# Patient Record
Sex: Female | Born: 1955 | Race: White | Hispanic: No | Marital: Married | Smoking: Former smoker
Health system: Southern US, Community
[De-identification: ages and names within clinical notes are randomized; demographics above are authoritative.]

## PROBLEM LIST (undated history)

## (undated) DIAGNOSIS — I10 Essential (primary) hypertension: Secondary | ICD-10-CM

## (undated) DIAGNOSIS — I350 Nonrheumatic aortic (valve) stenosis: Secondary | ICD-10-CM

## (undated) DIAGNOSIS — K635 Polyp of colon: Secondary | ICD-10-CM

## (undated) DIAGNOSIS — I38 Endocarditis, valve unspecified: Secondary | ICD-10-CM

## (undated) DIAGNOSIS — R0600 Dyspnea, unspecified: Secondary | ICD-10-CM

## (undated) DIAGNOSIS — I519 Heart disease, unspecified: Secondary | ICD-10-CM

## (undated) DIAGNOSIS — R011 Cardiac murmur, unspecified: Secondary | ICD-10-CM

## (undated) DIAGNOSIS — I209 Angina pectoris, unspecified: Secondary | ICD-10-CM

## (undated) DIAGNOSIS — R21 Rash and other nonspecific skin eruption: Secondary | ICD-10-CM

## (undated) DIAGNOSIS — R112 Nausea with vomiting, unspecified: Secondary | ICD-10-CM

## (undated) DIAGNOSIS — C801 Malignant (primary) neoplasm, unspecified: Secondary | ICD-10-CM

## (undated) DIAGNOSIS — E669 Obesity, unspecified: Secondary | ICD-10-CM

## (undated) DIAGNOSIS — D649 Anemia, unspecified: Secondary | ICD-10-CM

## (undated) DIAGNOSIS — K921 Melena: Secondary | ICD-10-CM

## (undated) DIAGNOSIS — Z79899 Other long term (current) drug therapy: Secondary | ICD-10-CM

## (undated) DIAGNOSIS — I499 Cardiac arrhythmia, unspecified: Secondary | ICD-10-CM

## (undated) DIAGNOSIS — G8929 Other chronic pain: Secondary | ICD-10-CM

## (undated) DIAGNOSIS — R6889 Other general symptoms and signs: Secondary | ICD-10-CM

## (undated) DIAGNOSIS — M1990 Unspecified osteoarthritis, unspecified site: Secondary | ICD-10-CM

## (undated) DIAGNOSIS — Z973 Presence of spectacles and contact lenses: Secondary | ICD-10-CM

## (undated) DIAGNOSIS — R12 Heartburn: Secondary | ICD-10-CM

## (undated) DIAGNOSIS — T753XXA Motion sickness, initial encounter: Secondary | ICD-10-CM

## (undated) HISTORY — DX: Essential (primary) hypertension: I10

## (undated) HISTORY — PX: TOTAL HIP ARTHROPLASTY: SHX124

## (undated) HISTORY — PX: HX HIP REPLACEMENT: SHX124

## (undated) HISTORY — PX: HX HERNIA REPAIR: SHX51

## (undated) HISTORY — PX: HX HEART CATHETERIZATION: SHX148

## (undated) HISTORY — PX: HX OOPHORECTOMY: SHX86

## (undated) HISTORY — PX: COLONOSCOPY: WVUENDOPRO10

## (undated) HISTORY — PX: HX AORTIC VALVE REPLACEMENT: SHX41

## (undated) HISTORY — DX: Nonrheumatic aortic (valve) stenosis: I35.0

---
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
## 1999-12-12 ENCOUNTER — Ambulatory Visit (HOSPITAL_COMMUNITY): Admission: RE | Admit: 1999-12-12 | Discharge: 1999-12-12 | Payer: Self-pay | Admitting: Obstetrics and Gynecology

## 2000-08-02 ENCOUNTER — Other Ambulatory Visit: Admission: RE | Admit: 2000-08-02 | Discharge: 2000-08-02 | Payer: Self-pay | Admitting: *Deleted

## 2001-10-19 HISTORY — PX: HX HYSTERECTOMY: SHX81

## 2002-01-16 ENCOUNTER — Encounter: Admission: RE | Admit: 2002-01-16 | Discharge: 2002-01-16 | Payer: Self-pay | Admitting: Obstetrics and Gynecology

## 2002-01-16 ENCOUNTER — Encounter: Payer: Self-pay | Admitting: Obstetrics and Gynecology

## 2002-03-01 ENCOUNTER — Ambulatory Visit (HOSPITAL_COMMUNITY): Admission: RE | Admit: 2002-03-01 | Discharge: 2002-03-01 | Payer: Self-pay

## 2002-07-24 ENCOUNTER — Encounter: Payer: Self-pay | Admitting: *Deleted

## 2002-07-26 ENCOUNTER — Observation Stay (HOSPITAL_COMMUNITY): Admission: RE | Admit: 2002-07-26 | Discharge: 2002-07-27 | Payer: Self-pay | Admitting: *Deleted

## 2003-11-06 ENCOUNTER — Ambulatory Visit (HOSPITAL_BASED_OUTPATIENT_CLINIC_OR_DEPARTMENT_OTHER): Admission: RE | Admit: 2003-11-06 | Discharge: 2003-11-06 | Payer: Self-pay | Admitting: Plastic Surgery

## 2003-11-06 ENCOUNTER — Ambulatory Visit (HOSPITAL_COMMUNITY): Admission: RE | Admit: 2003-11-06 | Discharge: 2003-11-06 | Payer: Self-pay | Admitting: Plastic Surgery

## 2004-08-27 ENCOUNTER — Ambulatory Visit (HOSPITAL_BASED_OUTPATIENT_CLINIC_OR_DEPARTMENT_OTHER): Admission: RE | Admit: 2004-08-27 | Discharge: 2004-08-27 | Payer: Self-pay | Admitting: *Deleted

## 2005-03-05 ENCOUNTER — Ambulatory Visit: Payer: Self-pay | Admitting: Family Medicine

## 2005-03-20 ENCOUNTER — Ambulatory Visit: Payer: Self-pay | Admitting: Internal Medicine

## 2005-05-29 ENCOUNTER — Ambulatory Visit (HOSPITAL_BASED_OUTPATIENT_CLINIC_OR_DEPARTMENT_OTHER): Admission: RE | Admit: 2005-05-29 | Discharge: 2005-05-29 | Payer: Self-pay | Admitting: *Deleted

## 2005-05-29 ENCOUNTER — Ambulatory Visit (HOSPITAL_COMMUNITY): Admission: RE | Admit: 2005-05-29 | Discharge: 2005-05-29 | Payer: Self-pay | Admitting: *Deleted

## 2005-08-04 ENCOUNTER — Emergency Department (HOSPITAL_COMMUNITY): Admission: EM | Admit: 2005-08-04 | Discharge: 2005-08-04 | Payer: Self-pay | Admitting: Emergency Medicine

## 2005-09-19 ENCOUNTER — Emergency Department (HOSPITAL_COMMUNITY): Admission: EM | Admit: 2005-09-19 | Discharge: 2005-09-19 | Payer: Self-pay | Admitting: Emergency Medicine

## 2005-09-21 ENCOUNTER — Emergency Department (HOSPITAL_COMMUNITY): Admission: EM | Admit: 2005-09-21 | Discharge: 2005-09-21 | Payer: Self-pay | Admitting: Family Medicine

## 2005-12-22 ENCOUNTER — Ambulatory Visit: Payer: Self-pay | Admitting: Family Medicine

## 2006-06-17 ENCOUNTER — Ambulatory Visit: Payer: Self-pay | Admitting: Family Medicine

## 2006-06-17 ENCOUNTER — Encounter: Admission: RE | Admit: 2006-06-17 | Discharge: 2006-06-17 | Payer: Self-pay | Admitting: Family Medicine

## 2006-07-06 ENCOUNTER — Ambulatory Visit: Payer: Self-pay | Admitting: Family Medicine

## 2006-07-20 ENCOUNTER — Ambulatory Visit: Payer: Self-pay | Admitting: Family Medicine

## 2007-08-16 DIAGNOSIS — J309 Allergic rhinitis, unspecified: Secondary | ICD-10-CM

## 2007-08-16 HISTORY — DX: Allergic rhinitis, unspecified: J30.9

## 2013-08-15 ENCOUNTER — Other Ambulatory Visit: Payer: Self-pay | Admitting: Family Medicine

## 2013-08-15 DIAGNOSIS — Z1231 Encounter for screening mammogram for malignant neoplasm of breast: Secondary | ICD-10-CM

## 2013-09-06 ENCOUNTER — Ambulatory Visit
Admission: RE | Admit: 2013-09-06 | Discharge: 2013-09-06 | Disposition: A | Discharge status: Home / Self Care | Payer: BC Managed Care – PPO | Source: Ambulatory Visit | Attending: Family Medicine | Admitting: Family Medicine

## 2013-09-06 ENCOUNTER — Ambulatory Visit: Admit: 2013-09-06 | Discharge: 2013-09-06 | Disposition: A | Discharge status: Home / Self Care | Payer: 59

## 2013-09-06 DIAGNOSIS — Z1231 Encounter for screening mammogram for malignant neoplasm of breast: Secondary | ICD-10-CM

## 2013-09-11 ENCOUNTER — Other Ambulatory Visit: Payer: Self-pay | Admitting: Family Medicine

## 2013-09-11 DIAGNOSIS — R928 Other abnormal and inconclusive findings on diagnostic imaging of breast: Secondary | ICD-10-CM

## 2013-09-18 ENCOUNTER — Ambulatory Visit
Admission: RE | Admit: 2013-09-18 | Discharge: 2013-09-18 | Disposition: A | Discharge status: Home / Self Care | Payer: BC Managed Care – PPO | Source: Ambulatory Visit | Attending: Family Medicine | Admitting: Family Medicine

## 2013-09-18 ENCOUNTER — Other Ambulatory Visit: Payer: Self-pay | Admitting: Family Medicine

## 2013-09-18 ENCOUNTER — Ambulatory Visit: Admit: 2013-09-18 | Discharge: 2013-09-18 | Disposition: A | Discharge status: Home / Self Care | Payer: 59

## 2013-09-18 DIAGNOSIS — R928 Other abnormal and inconclusive findings on diagnostic imaging of breast: Secondary | ICD-10-CM

## 2014-02-01 ENCOUNTER — Other Ambulatory Visit: Payer: Self-pay | Admitting: Family Medicine

## 2014-02-01 ENCOUNTER — Ambulatory Visit
Admission: RE | Admit: 2014-02-01 | Discharge: 2014-02-01 | Disposition: A | Discharge status: Home / Self Care | Payer: BC Managed Care – PPO | Source: Ambulatory Visit | Attending: Family Medicine | Admitting: Family Medicine

## 2014-02-01 DIAGNOSIS — R200 Anesthesia of skin: Secondary | ICD-10-CM

## 2014-02-01 DIAGNOSIS — M25552 Pain in left hip: Secondary | ICD-10-CM

## 2014-02-01 DIAGNOSIS — M79605 Pain in left leg: Secondary | ICD-10-CM

## 2014-02-28 ENCOUNTER — Other Ambulatory Visit: Payer: Self-pay | Admitting: Family Medicine

## 2014-02-28 DIAGNOSIS — N6320 Unspecified lump in the left breast, unspecified quadrant: Secondary | ICD-10-CM

## 2014-03-29 ENCOUNTER — Ambulatory Visit
Admission: RE | Admit: 2014-03-29 | Discharge: 2014-03-29 | Disposition: A | Discharge status: Home / Self Care | Payer: BC Managed Care – PPO | Source: Ambulatory Visit | Attending: Family Medicine | Admitting: Family Medicine

## 2014-03-29 ENCOUNTER — Encounter (INDEPENDENT_AMBULATORY_CARE_PROVIDER_SITE_OTHER): Payer: Self-pay

## 2014-03-29 ENCOUNTER — Ambulatory Visit: Admit: 2014-03-29 | Discharge: 2014-03-29 | Disposition: A | Discharge status: Home / Self Care | Payer: 59

## 2014-03-29 ENCOUNTER — Ambulatory Visit (HOSPITAL_COMMUNITY)
Admit: 2014-03-29 | Discharge: 2014-03-29 | Disposition: A | Discharge status: Home / Self Care | Payer: 59 | Admitting: Radiology

## 2014-03-29 DIAGNOSIS — N6320 Unspecified lump in the left breast, unspecified quadrant: Secondary | ICD-10-CM

## 2014-09-28 ENCOUNTER — Other Ambulatory Visit: Payer: Self-pay | Admitting: Family Medicine

## 2014-09-28 DIAGNOSIS — N6002 Solitary cyst of left breast: Secondary | ICD-10-CM

## 2014-10-15 ENCOUNTER — Ambulatory Visit
Admission: RE | Admit: 2014-10-15 | Discharge: 2014-10-15 | Disposition: A | Discharge status: Home / Self Care | Payer: BC Managed Care – PPO | Source: Ambulatory Visit | Attending: Family Medicine | Admitting: Family Medicine

## 2014-10-15 ENCOUNTER — Ambulatory Visit (HOSPITAL_COMMUNITY)
Admit: 2014-10-15 | Discharge: 2014-10-15 | Disposition: A | Discharge status: Home / Self Care | Payer: 59 | Admitting: Radiology

## 2014-10-15 ENCOUNTER — Ambulatory Visit: Admit: 2014-10-15 | Discharge: 2014-10-15 | Disposition: A | Discharge status: Home / Self Care | Payer: 59

## 2014-10-15 DIAGNOSIS — N6002 Solitary cyst of left breast: Secondary | ICD-10-CM

## 2015-01-08 IMAGING — CR DG LUMBAR SPINE COMPLETE 4+V
5 series · 5 of 5 positions shown · non-contrast
Comparison: CT of 08/04/2005 and today's left hip films.

CLINICAL DATA: Left hip pain.  Limited range of motion.

EXAM:
LUMBAR SPINE - COMPLETE 4+ VIEW

[view not recorded (1 of 5)]
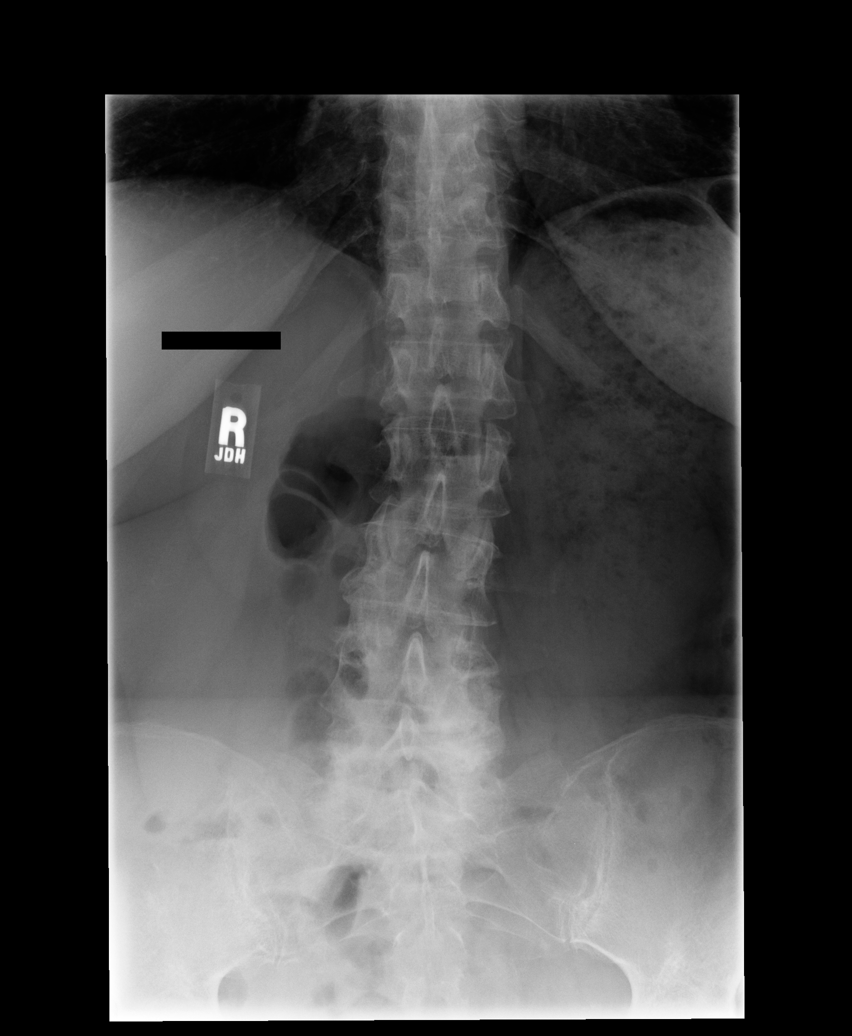

[view not recorded (2 of 5)]
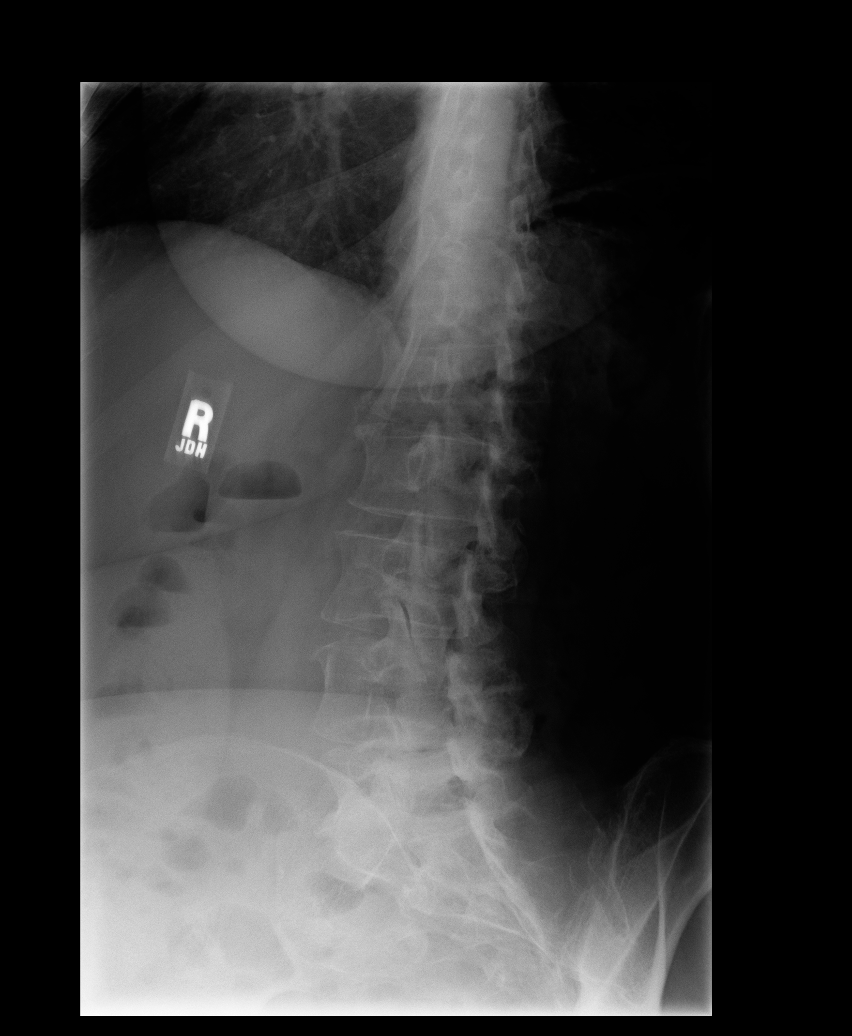

[view not recorded (3 of 5)]
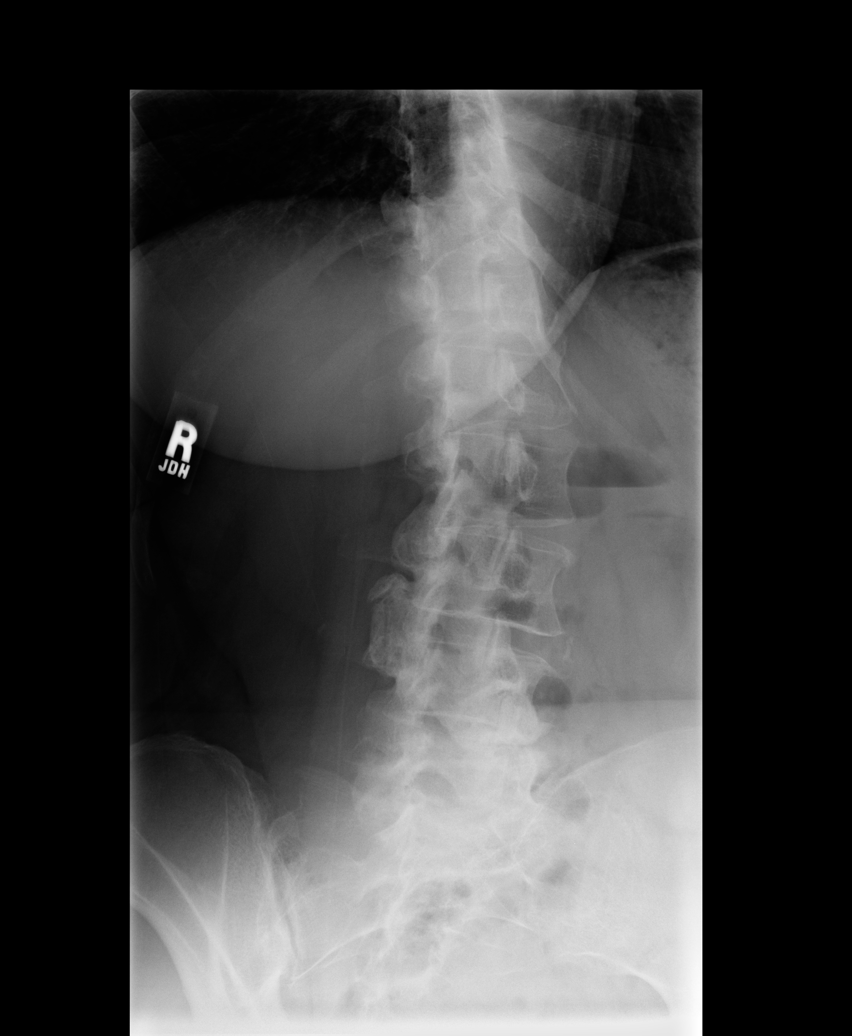

[view not recorded (4 of 5)]
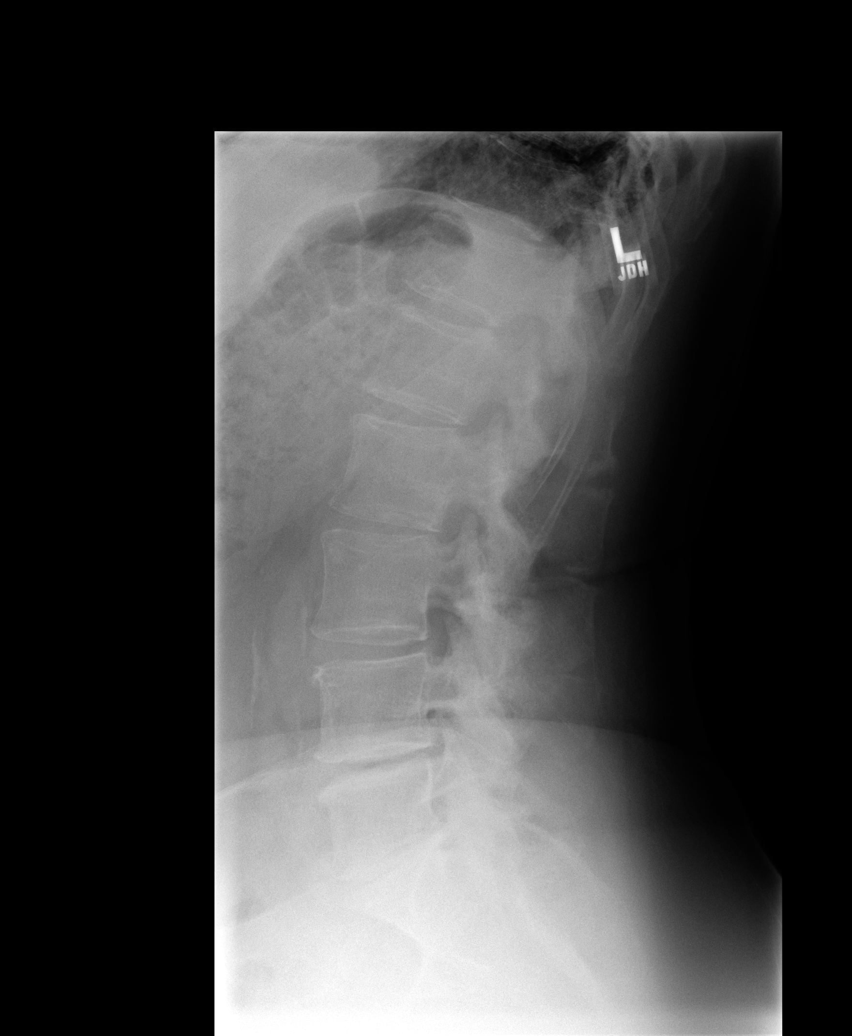

[view not recorded (5 of 5)]
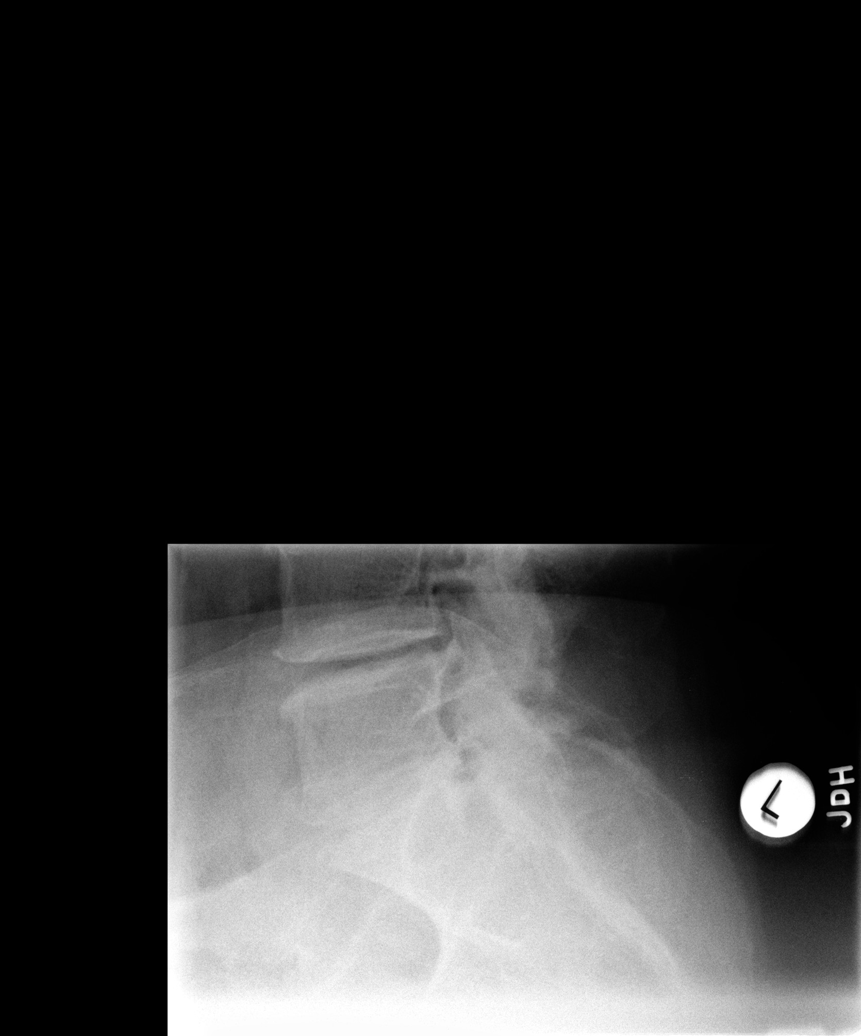

[5 of 5 positions shown; findings below may reference images not displayed]

FINDINGS: Five lumbar type vertebral bodies. Minimal convex left lumbar spine
curvature. Mild degenerative sclerosis of bilateral sacroiliac
joints.

Maintenance of vertebral body height and alignment. Aortic
atherosclerosis. Moderate L4-5 and marked L5-S1 degenerative disc
disease. Facet arthropathy at L4-S1.
IMPRESSION: Lower lumbar and lumbosacral spondylosis/ degenerative disc disease.
No acute osseous abnormality.

Aortic atherosclerosis.

## 2016-01-08 ENCOUNTER — Ambulatory Visit: Payer: 59 | Attending: Family | Admitting: Family

## 2016-01-08 ENCOUNTER — Encounter (HOSPITAL_BASED_OUTPATIENT_CLINIC_OR_DEPARTMENT_OTHER): Payer: Self-pay | Admitting: Family

## 2016-01-08 VITALS — BP 174/91 | HR 94 | Temp 97.7°F | Ht 64.53 in | Wt 198.9 lb

## 2016-01-08 DIAGNOSIS — E669 Obesity, unspecified: Secondary | ICD-10-CM | POA: Insufficient documentation

## 2016-01-08 DIAGNOSIS — M25552 Pain in left hip: Secondary | ICD-10-CM | POA: Insufficient documentation

## 2016-01-08 DIAGNOSIS — Z7689 Persons encountering health services in other specified circumstances: Secondary | ICD-10-CM

## 2016-01-08 DIAGNOSIS — Z1159 Encounter for screening for other viral diseases: Secondary | ICD-10-CM

## 2016-01-08 DIAGNOSIS — Z87891 Personal history of nicotine dependence: Secondary | ICD-10-CM | POA: Insufficient documentation

## 2016-01-08 DIAGNOSIS — Z1211 Encounter for screening for malignant neoplasm of colon: Secondary | ICD-10-CM

## 2016-01-08 DIAGNOSIS — R030 Elevated blood-pressure reading, without diagnosis of hypertension: Secondary | ICD-10-CM | POA: Insufficient documentation

## 2016-01-08 MED ORDER — NAPROXEN 500 MG TABLET
500.00 mg | ORAL_TABLET | Freq: Two times a day (BID) | ORAL | 1 refills | Status: DC
Start: 2016-01-08 — End: 2016-03-05

## 2016-01-08 NOTE — Progress Notes (Addendum)
SUBJECTIVE:   [name] is a 60 y.o. female  for presenting for initial visit with me to get established for her primary care.  Moved to [place] from [place] to be with family.   Reports having chronic left hip pain since 2013 has seen chiropractor periodically throughout the years. Had imaging in 2016 from radiology center. Has no seen orthopod for this. Reports that it is getting to the point that its affecting her ADL's and quality of life. Takes Motrin without much improvement. Not able to rest because of the pain.   There are no active problems to display for this patient.    Prevention History: need to obtain records from previous PCP, she does not that her last mammogram was June 2016  Last lipid profile and glucose:  No results found for: CHOLESTEROL, HDLCHOL, LDLCHOL, TRIG, GLUCOSEFAST   No past medical history on file.  Past Medical History was reviewed and is negative for any chroninc illnesses     Past Surgical History:   Procedure Laterality Date    HX CESAREAN SECTION  1984    HX HERNIA REPAIR  2004, 2006, 2008    HX HYSTERECTOMY  2003       No outpatient prescriptions prior to visit.  No facility-administered medications prior to visit.    No family history on file.    Social History     Social History    Marital status: Single     Spouse name: N/A    Number of children: N/A    Years of education: N/A     Occupational History    Not on file.     Social History Main Topics    Smoking status: Former Smoker    Smokeless tobacco: Never Used    Alcohol use Yes    Drug use: No    Sexual activity: Not on file     Other Topics Concern    Not on file     Social History Narrative    No narrative on file        There is no immunization history on file for this patient.    ROS: A comprehensive review of systems was negative except for:  Musculoskeletal: positive for chronic left hip pain.     OBJECTIVE:   BP (!) 174/91   Pulse 94   Temp 36.5 C (97.7 F) (Oral)    Ht 1.639 m (5' 4.53")   Wt  90.2 kg (198 lb 13.7 oz)   BMI 33.58 kg/m2   Body mass index is 33.58 kg/(m^2).     Exam: General appearance: alert, cooperative, no distress, appears stated age  Throat: Lips, mucosa, and tongue normal. Teeth and gums normal  Lungs: clear to auscultation bilaterally  Heart: regular rate and rhythm, S1, S2 normal, no murmur, click, rub or gallop  Abdomen: soft, non-tender. Bowel sounds normal. No masses,  no organomegaly  Neurologic: Alert and oriented X 3, normal strength and tone. Normal symmetric reflexes. Normal coordination and gait    ASSESSMENT & PLAN:     ICD-10-CM    1. Encounter to establish care Z76.89    2. Left hip pain M25.552 External Referral to Physical Therapy     Referral to Orthopaedic Surgery     naproxen (NAPROSYN) 500 mg Oral Tablet   3. Elevated BP without diagnosis of hypertension R03.0 Lipid Panel     Basic Metabolic Pnl, Fasting     Cbc   4. Obesity E66.9 Hemoglobin A1C  5. Need for hepatitis C screening test Z11.59 HEPATITIS C ANTIBODY SCREEN WITH REFLEX TO HCV PCR     1.  Health maintenance.  The patient knows she is up to date on her mammography and will try to obtain the rest of her health maintenance records from her previous PCP from [place].  I gave her a fit test as she has never had a colonoscopy in the past.  2.  Left hip pain.  Due to the duration of complications with her hip, I would like for her to see an orthopaedic surgeon because it is now affecting her activities of daily living and her quality of life.  Would not x-ray at this point.  I will try to obtain the radiology report that she had in 2016, from [facility] in [place].  3.  Establish care.  We will get baseline set of labs.  Her blood pressure is elevated today.  I would like to see the patient back in 1 month with blood pressure readings.  Repeat blood pressure was 150/90.  She denies having any chest pain, shortness of breath, TIA symptoms; however, if she develops these, she needs to be  reevaluated or seen in the Emergency Department for further plan of care and evaluation.  The patient verbalizes understanding and agrees with the plan.    Return in about 5 weeks (around 02/12/2016) for BP follow-up.  Patient was seen independently, co-signing physician on site if consultation was needed.    Derek Jack, APRN  FAMILY MED-CHEAT  Operated by [facility]  [address]  [place] [address]  Dept: [phone]

## 2016-01-13 ENCOUNTER — Other Ambulatory Visit (HOSPITAL_BASED_OUTPATIENT_CLINIC_OR_DEPARTMENT_OTHER): Payer: Self-pay

## 2016-01-20 ENCOUNTER — Ambulatory Visit: Payer: 59 | Attending: Family | Admitting: Gynecology

## 2016-01-20 DIAGNOSIS — E669 Obesity, unspecified: Secondary | ICD-10-CM

## 2016-01-20 DIAGNOSIS — R030 Elevated blood-pressure reading, without diagnosis of hypertension: Principal | ICD-10-CM | POA: Insufficient documentation

## 2016-01-20 DIAGNOSIS — Z1159 Encounter for screening for other viral diseases: Secondary | ICD-10-CM

## 2016-01-20 LAB — LIPID PANEL
CHOL/HDL RATIO: 3
CHOLESTEROL: 181 mg/dL (ref <200)
HDL CHOL: 60 mg/dL (ref >=49)
LDL CALC: 104 mg/dL — ABNORMAL HIGH (ref <=100)
NON-HDL: 121 mg/dL (ref <=190)
TRIGLYCERIDES: 87 mg/dL (ref <=150)
VLDL CALC: 17 mg/dL (ref <30)

## 2016-01-20 LAB — CBC
HCT: 43.5 % (ref 33.5–45.2)
HGB: 14.2 g/dL (ref 11.2–15.2)
MCH: 29.6 pg (ref 27.4–33.0)
MCHC: 32.6 g/dL (ref 32.5–35.8)
MCV: 90.7 fL (ref 78.0–100.0)
MPV: 7.5 fL (ref 7.5–11.5)
PLATELETS: 269 x10ˆ3/uL (ref 140–450)
RBC: 4.8 x10ˆ6/uL (ref 3.63–4.92)
RDW: 12.6 % (ref 12.0–15.0)
WBC: 5.2 x10ˆ3/uL (ref 3.5–11.0)

## 2016-01-20 LAB — BASIC METABOLIC PANEL, FASTING
ANION GAP: 8 mmol/L (ref 4–13)
BUN/CREA RATIO: 24 — ABNORMAL HIGH (ref 6–22)
BUN: 19 mg/dL (ref 8–25)
CALCIUM: 9.6 mg/dL (ref 8.5–10.4)
CHLORIDE: 103 mmol/L (ref 96–111)
CO2 TOTAL: 27 mmol/L (ref 22–32)
CREATININE: 0.78 mg/dL (ref 0.49–1.10)
ESTIMATED GFR: >59 mL/min/1.73mˆ2 (ref >59)
GLUCOSE: 88 mg/dL (ref 70–105)
POTASSIUM: 4.4 mmol/L (ref 3.5–5.1)
SODIUM: 138 mmol/L (ref 136–145)

## 2016-01-20 LAB — POCT FECAL IMMUNOCHEMICAL TEST (FIT): FIT FECAL TEST: NEGATIVE

## 2016-01-20 LAB — HEPATITIS C ANTIBODY SCREEN WITH REFLEX TO HCV PCR: HCV ANTIBODY QUALITATIVE: NEGATIVE

## 2016-01-20 NOTE — Addendum Note (Signed)
Addended by: Paula Libra on: 01/20/2016 07:57 AM     Modules accepted: Orders

## 2016-01-21 LAB — HGA1C (HEMOGLOBIN A1C WITH EST AVG GLUCOSE)
ESTIMATED AVERAGE GLUCOSE: 105 mg/dL
HEMOGLOBIN A1C: 5.3 % (ref 4.0–6.0)

## 2016-02-05 ENCOUNTER — Ambulatory Visit
Admission: RE | Admit: 2016-02-05 | Discharge: 2016-02-05 | Disposition: A | Discharge status: Home / Self Care | Payer: 59 | Source: Ambulatory Visit | Attending: Orthopaedic Surgery | Admitting: Orthopaedic Surgery

## 2016-02-05 ENCOUNTER — Ambulatory Visit (HOSPITAL_BASED_OUTPATIENT_CLINIC_OR_DEPARTMENT_OTHER): Payer: 59 | Admitting: Radiology

## 2016-02-05 ENCOUNTER — Other Ambulatory Visit (HOSPITAL_BASED_OUTPATIENT_CLINIC_OR_DEPARTMENT_OTHER): Payer: Self-pay | Admitting: Physician Assistant

## 2016-02-05 ENCOUNTER — Encounter (HOSPITAL_BASED_OUTPATIENT_CLINIC_OR_DEPARTMENT_OTHER): Payer: Self-pay | Admitting: Orthopaedic Surgery

## 2016-02-05 ENCOUNTER — Ambulatory Visit (HOSPITAL_BASED_OUTPATIENT_CLINIC_OR_DEPARTMENT_OTHER): Payer: 59 | Admitting: Orthopaedic Surgery

## 2016-02-05 DIAGNOSIS — M5136 Other intervertebral disc degeneration, lumbar region: Secondary | ICD-10-CM | POA: Insufficient documentation

## 2016-02-05 DIAGNOSIS — M25569 Pain in unspecified knee: Secondary | ICD-10-CM

## 2016-02-05 DIAGNOSIS — M25552 Pain in left hip: Secondary | ICD-10-CM

## 2016-02-05 DIAGNOSIS — M160 Bilateral primary osteoarthritis of hip: Secondary | ICD-10-CM

## 2016-02-05 DIAGNOSIS — M1612 Unilateral primary osteoarthritis, left hip: Secondary | ICD-10-CM | POA: Insufficient documentation

## 2016-02-05 DIAGNOSIS — M5137 Other intervertebral disc degeneration, lumbosacral region: Secondary | ICD-10-CM

## 2016-02-05 DIAGNOSIS — M1990 Unspecified osteoarthritis, unspecified site: Secondary | ICD-10-CM

## 2016-02-05 DIAGNOSIS — M170 Bilateral primary osteoarthritis of knee: Secondary | ICD-10-CM

## 2016-02-05 NOTE — H&P (Addendum)
[name], [place] [address]                                PATIENT NAME: [name]  DATE OF SERVICE:02/05/2016  DATE OF BIRTH: 30-Aug-1956    HISTORY AND PHYSICAL    REASON FOR VISIT:  Left hip pain.    HISTORY OF PRESENT ILLNESS:  The patient is a 60 year old female who comes in today for evaluation of left hip pain.  The patient has significant pain for the past 3 years.  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  She describes the pain as constant, stabbing and aching in nature.  She describes it at its worst as an 8 out of 10.  She says walking makes the pain worse and lying down or sitting makes the pain better.  She does complain of pain being located in the left posterior thigh radiating to her left groin and sometimes she states she also has pain in her back that radiates down her leg into her left foot.  She describes it as 2 separate pains with the pain in her left hip radiating to her left groin being the most frequent and severe of the two.  She also states that the only thing that really helps at this point is naproxen which she has been scheduled to take that twice a day by her PCP.  With that, the pain has been manageable at a 3-4 out of 10.  She is here today for evaluation of possible options related to a total hip arthroplasty.    PAST MEDICAL HISTORY:  Psoriasis.    PAST SURGICAL HISTORY:  Includes 3 right-sided hernia surgeries, C-section and a hysterectomy.    MEDICATIONS:  1.  Naproxen 500 mg twice a day.  2.  Multivitamin.  3.  Vitamin D3.  4.  Vitamin B12.  5.  Vitamin C.    ALLERGIES:  Stadol.    FAMILY HISTORY:  Heart disease and  cancer.    SOCIAL HISTORY:  The patient is a prior film location scout.  She currently does not work due to her significant hip pain.  She currently lives with her daughter and son-in-law.  She is currently single.  She does climb stairs in her home.  She uses no cane or a walking aid.  She does not smoke and does drink alcohol 1-2 times a week.    REVIEW OF SYSTEMS:  Negative except for HPI.    PHYSICAL EXAMINATION:  The patient is alert and oriented and in no acute distress.  She appears her stated age.  She is alert and oriented x3.  Blood pressure 159/91, pulse 61, temperature 36.6, her weight is 89.3 kg, height 165.9 cm.  The patient's head is normocephalic, atraumatic.  Eyes are nonicteric.  She is breathing normally on room air.  Mucous membranes are moist.  Her belly is soft and nontender.  On evaluation of her left hip, the patient has significant pain with any range of motion of her left hip.  She has significant decrease in internal and external rotation.  She has about 5 degrees of external rotation, 0 degrees of internal rotation.  She is able to flex up to approximately 90 degrees.  She has no pain in her left knee.  She has a negative straight leg raise.  She is able to do a straight leg raise at 90 degrees.  The patient has sensation intact to light touch distally over peroneal and tibial nerve distributions.  She is able to wiggle all of her toes and has full range of motion of her left ankle.      IMAGING:  X-rays were taken today of her pelvis and left hip.  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  She has also moderate arthritis on the right hip but she does have a small amount of joint space still viable.  Her lumbar films today show that she has significant lumbar degenerative disk disease with some osteophyte formation and some facet arthrosis.    ASSESSMENT AND  PLAN:  This is a  60 year old female with significant degenerative arthritis who would benefit from a total hip replacement.  The patient has failed conservative management including anti-inflammatories, chiropractic treatment, home exercise regimen as well as activity modification and she is having significant detriment to her social life and inability to play with her grandkids, as well as continue working with her job due to this left hip pain.  We do believe that she would benefit from a total hip arthroplasty and would get a great result due to the significant pain coming from her hip at this time.  We do not believe that any formal physical therapy as well as any formal injections into her left hip would significantly benefit her at this time due to the severity of her current disease.  We gave her the option for a total hip arthroplasty today.  The patient states she would like to go home and talk to her family and will call us back with a final decision for what to do next.  The patient agrees with the assessment and plan and will call to schedule her future appointment.      Zackery Barefoot, MD  Resident  [facility] Department of [place] Sharlene Motts, MD  Assistant Professor  [facility] Department of Orthopaedics    Late entry for 02/05/16. I saw and examined the patient.  I reviewed the resident's note.  I agree with the findings and plan of care as documented in the resident's note.  Any exceptions/additions are edited/noted.    [name], MD      RY:[id number]; D: 02/05/2016 [id number]; T: 02/05/2016 19:47:52

## 2016-02-12 ENCOUNTER — Ambulatory Visit: Payer: 59 | Attending: Family Medicine | Admitting: Family

## 2016-02-12 ENCOUNTER — Encounter (HOSPITAL_BASED_OUTPATIENT_CLINIC_OR_DEPARTMENT_OTHER): Payer: Self-pay | Admitting: Family

## 2016-02-12 VITALS — BP 148/89 | HR 90 | Temp 98.6°F | Wt 195.3 lb

## 2016-02-12 DIAGNOSIS — R011 Cardiac murmur, unspecified: Secondary | ICD-10-CM

## 2016-02-12 DIAGNOSIS — I10 Essential (primary) hypertension: Secondary | ICD-10-CM | POA: Insufficient documentation

## 2016-02-12 MED ORDER — HYDROCHLOROTHIAZIDE 25 MG TABLET
25.0000 mg | ORAL_TABLET | Freq: Every day | ORAL | 4 refills | Status: DC
Start: 2016-02-12 — End: 2016-05-14

## 2016-02-12 NOTE — Progress Notes (Addendum)
Subjective:   [name] is a 60 y.o. female with hypertension.  Hypertension treatment  Nothing as of yet. Was to monitor BP over the month and RTC for review.  Pertinent history:taking medications as instructed, no medication side effects noted, home BP monitoring in range of [id number] systolic over [id number] diastolic, no TIAs, no chest pain on exertion, no dyspnea on exertion, no swelling of ankles  Lab Results   Component Value Date    SODIUM 138 01/20/2016    POTASSIUM 4.4 01/20/2016    CHLORIDE 103 01/20/2016    CO2 27 01/20/2016    ANIONGAP 8 01/20/2016    BUN 19 01/20/2016    CREATININE 0.78 01/20/2016    BUNCRRATIO 24 (H) 01/20/2016    GFR >59 01/20/2016    GLUCOSENF 88 01/20/2016    CHOLESTEROL 181 01/20/2016    HDLCHOL 60 01/20/2016    LDLCHOL 104 (H) 01/20/2016    TRIG 87 01/20/2016         Outpatient Medications Prior to Visit:  cyanocobalamin (VITAMIN B-12) 100 mcg Oral Tablet Take 100 mcg by mouth Once a day   MAGNESIUM CITRATE ORAL Take by mouth Reported on 02/12/2016   multivitamin Oral Tablet Take 1 Tab by mouth Once a day   naproxen (NAPROSYN) 500 mg Oral Tablet Take 1 Tab (500 mg total) by mouth Twice daily with food   ERGOCALCIFEROL, VITAMIN D2, (VITAMIN D2 ORAL) Take by mouth   Ibuprofen (MOTRIN) 600 mg Oral Tablet Take 600 mg by mouth Four times a day as needed for Pain     No facility-administered medications prior to visit.    Review of Systems - as per HPI. Denies CP, SOB. Feels well. Denies CP, SOB, TIA s/s.     OBJECTIVE:  BP (!) 148/89   Pulse 90   Temp 37 C (98.6 F) (Tympanic)    Wt 88.6 kg (195 lb 5.2 oz)   Breastfeeding? No   BMI 32.19 kg/m2 Body mass index is 32.19 kg/(m^2).     BP Readings from Last 3 Encounters:   02/12/16 (!) 148/89   02/05/16 (!) 159/91   01/08/16 (!) 174/91     Wt Readings from Last 3 Encounters:   02/12/16 88.6 kg (195 lb 5.2 oz)   02/05/16 89.3 kg (196 lb 13.9 oz)   01/08/16 90.2 kg (198 lb 13.7 oz)     General appearance: in no apparent distress and well  developed and well nourished  General exam BP noted to be well controlled today in office, S1, S2 normal, no gallop, no murmur, chest clear, no JVD, no HSM, no edema.     ASSESSMENT:  PLAN:    ICD-10-CM    1. Essential hypertension I10 cholecalciferol, vitamin D3, 1,000 unit Oral Tablet     hydroCHLOROthiazide (HYDRODIURIL) 25 mg Oral Tablet   2. Heart murmur previously undiagnosed R01.1 TRANSTHORACIC ECHOCARDIOGRAM - ADULT     Patient is going to have hip replaced. Newly found heart murmur last visit- she is agreeable to having an ECHO completed for evaluation prior to pre-op exam.   DASH diet and exercise reviewed. Start HCTZ. Will see her again on one month. Continue to monitor BP readings.     BMI addressed:   Advised on diet, weight loss, and exercise to reduce their above normal BMI.   Return in about 4 weeks (around 03/11/2016) for HTN follow-up.    Patient was seen independently, co-signing physician on site if consultation was needed.    Raquel Sarna  June Leap, APRN  FAMILY MED-CHEAT  Operated by [facility]  [address]  [place] [address]  Dept: [phone]

## 2016-02-17 ENCOUNTER — Encounter (HOSPITAL_BASED_OUTPATIENT_CLINIC_OR_DEPARTMENT_OTHER): Payer: Self-pay | Admitting: Family

## 2016-02-19 ENCOUNTER — Ambulatory Visit
Admission: RE | Admit: 2016-02-19 | Discharge: 2016-02-19 | Disposition: A | Discharge status: Home / Self Care | Payer: 59 | Source: Ambulatory Visit | Attending: Family | Admitting: Family

## 2016-02-19 DIAGNOSIS — R011 Cardiac murmur, unspecified: Secondary | ICD-10-CM

## 2016-02-19 DIAGNOSIS — I351 Nonrheumatic aortic (valve) insufficiency: Secondary | ICD-10-CM

## 2016-02-19 LAB — TRANSTHORACIC ECHOCARDIOGRAM - ADULT
AV mean gradient: 36 mmHg
Ao peak vel: 3.7 m/s

## 2016-02-24 ENCOUNTER — Ambulatory Visit (HOSPITAL_BASED_OUTPATIENT_CLINIC_OR_DEPARTMENT_OTHER): Payer: Self-pay | Admitting: Family

## 2016-02-24 ENCOUNTER — Other Ambulatory Visit (HOSPITAL_BASED_OUTPATIENT_CLINIC_OR_DEPARTMENT_OTHER): Payer: Self-pay | Admitting: Family

## 2016-02-24 DIAGNOSIS — I350 Nonrheumatic aortic (valve) stenosis: Secondary | ICD-10-CM

## 2016-02-25 ENCOUNTER — Encounter (HOSPITAL_BASED_OUTPATIENT_CLINIC_OR_DEPARTMENT_OTHER): Payer: Self-pay

## 2016-03-04 ENCOUNTER — Ambulatory Visit (HOSPITAL_BASED_OUTPATIENT_CLINIC_OR_DEPARTMENT_OTHER): Payer: 59

## 2016-03-04 ENCOUNTER — Encounter (HOSPITAL_BASED_OUTPATIENT_CLINIC_OR_DEPARTMENT_OTHER): Payer: Self-pay | Admitting: Cardiovascular Disease

## 2016-03-04 ENCOUNTER — Ambulatory Visit: Payer: 59 | Attending: Cardiovascular Disease | Admitting: Cardiovascular Disease

## 2016-03-04 VITALS — BP 128/88 | HR 100 | Resp 18 | Ht 65.0 in | Wt 191.1 lb

## 2016-03-04 DIAGNOSIS — Z87891 Personal history of nicotine dependence: Secondary | ICD-10-CM | POA: Insufficient documentation

## 2016-03-04 DIAGNOSIS — I350 Nonrheumatic aortic (valve) stenosis: Secondary | ICD-10-CM

## 2016-03-04 DIAGNOSIS — I10 Essential (primary) hypertension: Secondary | ICD-10-CM

## 2016-03-04 DIAGNOSIS — Z79899 Other long term (current) drug therapy: Secondary | ICD-10-CM | POA: Insufficient documentation

## 2016-03-04 LAB — CBC
HCT: 40.9 % (ref 33.5–45.2)
HGB: 13.8 g/dL (ref 11.2–15.2)
MCH: 30.4 pg (ref 27.4–33.0)
MCHC: 33.9 g/dL (ref 32.5–35.8)
MCV: 89.8 fL (ref 78.0–100.0)
MPV: 7.2 fL — ABNORMAL LOW (ref 7.5–11.5)
PLATELETS: 259 x10ˆ3/uL (ref 140–450)
RBC: 4.55 x10ˆ6/uL (ref 3.63–4.92)
RDW: 12.5 % (ref 12.0–15.0)
WBC: 7.1 x10ˆ3/uL (ref 3.5–11.0)

## 2016-03-04 LAB — PT/INR
INR: 1.06 (ref 0.80–1.20)
PROTHROMBIN TIME: 12 s (ref 9.0–13.6)

## 2016-03-04 LAB — BASIC METABOLIC PANEL
ANION GAP: 8 mmol/L (ref 4–13)
BUN/CREA RATIO: 22 (ref 6–22)
BUN: 17 mg/dL (ref 8–25)
CALCIUM: 9.4 mg/dL (ref 8.5–10.4)
CHLORIDE: 95 mmol/L — ABNORMAL LOW (ref 96–111)
CO2 TOTAL: 29 mmol/L (ref 22–32)
CREATININE: 0.78 mg/dL (ref 0.49–1.10)
ESTIMATED GFR: >59 mL/min/1.73mˆ2 (ref >59)
GLUCOSE: 95 mg/dL (ref 65–139)
POTASSIUM: 4 mmol/L (ref 3.5–5.1)
SODIUM: 132 mmol/L — ABNORMAL LOW (ref 136–145)

## 2016-03-04 NOTE — Progress Notes (Addendum)
Requesting Physician: Derek Jack,*  History of Present Illness  [name] is a 60 y.o. female who presents to establish care.  She has a history of HTN.  She recently saw her PCP in follow up and for preop evaluation for a hip replacement.  A mumur was heard on exam and therefore she had an echo done. The echo showed moderate to severe AS and she was referred to cardiology.    She reports having no cardiac symptoms.  She denies angina and syncope.  She does get some shortness of breath climbing stairs, however does not have great functional capacity right now secondary to her hip arthritis.      Past History  Current Outpatient Prescriptions   Medication Sig    cholecalciferol, vitamin D3, 1,000 unit Oral Tablet Take 1,000 Units by mouth Once a day    cyanocobalamin (VITAMIN B-12) 100 mcg Oral Tablet Take 100 mcg by mouth Once a day    hydroCHLOROthiazide (HYDRODIURIL) 25 mg Oral Tablet Take 1 Tab (25 mg total) by mouth Once a day    multivitamin Oral Tablet Take 1 Tab by mouth Once a day    naproxen (NAPROSYN) 500 mg Oral Tablet Take 1 Tab (500 mg total) by mouth Twice daily with food     Allergies   Allergen Reactions    Iv Contrast Anaphylaxis    Oregano     Stadol [Butorphanol Tartrate] Mental Status Effect    Tomato Hives/ Urticaria     Past Medical History:   Diagnosis Date    High blood pressure          Past Surgical History:   Procedure Laterality Date    HX CESAREAN SECTION  19    HX HERNIA REPAIR  2004, 2006, 2008    HX HYSTERECTOMY  2003         Family History  Family History   Problem Relation Age of Onset    Heart Disease Mother     Sudden Death no cause Mother     Hypertension Father     No Known Problems Sister     Cancer Brother     No Known Problems Sister          Social History  Social History     Social History    Marital status: Single     Spouse name: N/A    Number of children: N/A    Years of education: N/A     Social History Main Topics    Smoking  status: Former Smoker     Packs/day: 1.00     Years: 35.00    Smokeless tobacco: Never Used    Alcohol use Yes      Comment: social only    Drug use: No    Sexual activity: Not on file     Other Topics Concern    Not on file     Social History Narrative    No narrative on file     Review of Systems  Other than ROS in the HPI, all other systems were negative.  Examination  BP 128/88   Pulse 100   Resp 18   Ht 1.651 m (5\' 5" )   Wt 86.7 kg (191 lb 2.2 oz)   SpO2 95%   BMI 31.81 kg/m2  General: appears in good health and appears stated age  Eyes: Conjunctiva clear., Sclera icteric.   HENT:atraumatic, normocephalic  Neck: No JVD or thyromegaly and supple, symmetrical, trachea  midline  Lungs: clear to auscultation bilaterally.  and thoracic excursion   normal  Cardiovascular:    Heart regular rate and rhythm with  4/6  systolic murmur, radiation to BL carotids, S2 preserved    Vascular Pulsus 2+ throughout    Chest wall NormalAbdomen: soft, non-tender and bowel sounds normal  Extremities: no cyanosis or edema  Skin: Skin warm and dry, No rashes and No lesions  Neurologic: grossly normal and alert and oriented x3  Psychiatric: AOx3 and normal affect, behavior, memory, thought content, judgement, and speech.    Diagnosis and Plan  1. Aortic valve stenosis    2. HTN (hypertension)      Moderate to severe AS by echo, with mean gradient 36 and peak gradient 55.  Patient appears to be overall asymptomatic.  Recommend LHC to confirm AS. Patient is agreeable to this plan.  Will send to Dr. Gildardo Pounds for procedure.  Orders placed.  Pre cath labs ordered today.  RN will call to schedule patient.     Follow up post procedure or in 4 weeks.      Orders Placed This Encounter    Cbc    Basic Metabolic Panel    PT/INR    ECG 12-LEAD - ADD ON IN [facility] ONLY    CASE REQUEST CATH LAB: CORONARY ANGIOGRAPHY W/LEFT HEART CATH W/WO LVG, ASCENDING AORTOGRAM     Olevia Perches, APRN  Joaquin Bend, MD      I saw and examined the  patient.  I reviewed the APP's note.  I agree with the findings and plan of care as documented in the APP's note.  Any exceptions/additions are edited/noted.    Joaquin Bend, MD

## 2016-03-05 ENCOUNTER — Other Ambulatory Visit (HOSPITAL_BASED_OUTPATIENT_CLINIC_OR_DEPARTMENT_OTHER): Payer: Self-pay | Admitting: Family

## 2016-03-05 DIAGNOSIS — M25552 Pain in left hip: Secondary | ICD-10-CM

## 2016-03-05 LAB — ECG 12-LEAD
Atrial Rate: 75 {beats}/min
Calculated P Axis: 60 degrees
Calculated R Axis: 37 degrees
Calculated T Axis: 47 degrees
PR Interval: 132 ms
QT Interval: 394 ms
QTC Calculation: 439 ms
Ventricular rate: 75 {beats}/min

## 2016-03-05 MED ORDER — NAPROXEN 500 MG TABLET
500.00 mg | ORAL_TABLET | Freq: Two times a day (BID) | ORAL | 2 refills | Status: DC
Start: 2016-03-05 — End: 2016-06-03

## 2016-03-05 NOTE — Telephone Encounter (Signed)
Refill Request Note  Patient requests refill on listed medications.  Last visit at [facility]:  02/12/2016  Next pending visit [facility]: Visit date not found    Medication pended to provider for approval.    Fermin Schwab, RN 03/05/2016, 16:20

## 2016-03-05 NOTE — Telephone Encounter (Signed)
From: [name]  To: Derek Jack, APRN  Sent: 03/05/2016 3:58 PM EDT  Subject: Medication Renewal Request    Original authorizing provider: Derek Jack, APRN    [name] would like a refill of the following medications:  naproxen (NAPROSYN) 500 mg Oral Tablet [Emily June Leap, APRN]    Preferred pharmacy: [facility], [place] [address]    Comment:

## 2016-03-11 ENCOUNTER — Encounter (HOSPITAL_BASED_OUTPATIENT_CLINIC_OR_DEPARTMENT_OTHER): Payer: 59 | Admitting: Family

## 2016-03-11 ENCOUNTER — Other Ambulatory Visit (HOSPITAL_BASED_OUTPATIENT_CLINIC_OR_DEPARTMENT_OTHER): Payer: Self-pay | Admitting: Family

## 2016-03-11 ENCOUNTER — Encounter (HOSPITAL_BASED_OUTPATIENT_CLINIC_OR_DEPARTMENT_OTHER): Payer: Self-pay | Admitting: Cardiovascular Disease

## 2016-03-11 DIAGNOSIS — I10 Essential (primary) hypertension: Secondary | ICD-10-CM

## 2016-03-12 ENCOUNTER — Telehealth (HOSPITAL_COMMUNITY): Payer: Self-pay | Admitting: Interventional Cardiology

## 2016-03-12 NOTE — Telephone Encounter (Signed)
Received order for cardiac cath with Dr. Gildardo Pounds. Spoke with pt, scheduled procedure, and gave her instructions. Pt to report to first floor registration at [facility] on 03/26/16 at 7:30 am, NPO after MN, May take am meds with sip of water, and she will need someone with her to drive her home. Pt verbalizes understanding of all instructions given and denies any questions or concerns at this time.

## 2016-03-18 ENCOUNTER — Ambulatory Visit (HOSPITAL_BASED_OUTPATIENT_CLINIC_OR_DEPARTMENT_OTHER): Payer: 59 | Admitting: ORTHOPEDIC, SPORTS MEDICINE

## 2016-03-26 ENCOUNTER — Inpatient Hospital Stay
Admission: RE | Admit: 2016-03-26 | Discharge: 2016-03-26 | Disposition: A | Discharge status: Home / Self Care | Payer: 59 | Source: Ambulatory Visit | Attending: Interventional Cardiology | Admitting: Interventional Cardiology

## 2016-03-26 ENCOUNTER — Encounter (HOSPITAL_COMMUNITY)
Admission: RE | Disposition: A | Discharge status: Home / Self Care | Payer: Self-pay | Source: Ambulatory Visit | Attending: Interventional Cardiology

## 2016-03-26 ENCOUNTER — Ambulatory Visit (HOSPITAL_COMMUNITY): Payer: 59 | Admitting: Interventional Cardiology

## 2016-03-26 ENCOUNTER — Encounter (HOSPITAL_COMMUNITY): Payer: Self-pay

## 2016-03-26 DIAGNOSIS — I10 Essential (primary) hypertension: Secondary | ICD-10-CM | POA: Insufficient documentation

## 2016-03-26 DIAGNOSIS — I2510 Atherosclerotic heart disease of native coronary artery without angina pectoris: Secondary | ICD-10-CM

## 2016-03-26 DIAGNOSIS — Z87891 Personal history of nicotine dependence: Secondary | ICD-10-CM | POA: Insufficient documentation

## 2016-03-26 DIAGNOSIS — M1610 Unilateral primary osteoarthritis, unspecified hip: Secondary | ICD-10-CM | POA: Insufficient documentation

## 2016-03-26 DIAGNOSIS — Z79899 Other long term (current) drug therapy: Secondary | ICD-10-CM | POA: Insufficient documentation

## 2016-03-26 DIAGNOSIS — Z01818 Encounter for other preprocedural examination: Secondary | ICD-10-CM

## 2016-03-26 DIAGNOSIS — I350 Nonrheumatic aortic (valve) stenosis: Secondary | ICD-10-CM | POA: Insufficient documentation

## 2016-03-26 LAB — ECG 12-LEAD
Atrial Rate: 65 {beats}/min
Calculated P Axis: 47 degrees
Calculated R Axis: 51 degrees
Calculated T Axis: 52 degrees
PR Interval: 128 ms
QT Interval: 412 ms
QTC Calculation: 428 ms
Ventricular rate: 65 {beats}/min

## 2016-03-26 LAB — SODIUM: SODIUM: 133 mmol/L — ABNORMAL LOW (ref 136–145)

## 2016-03-26 LAB — CHLORIDE: CHLORIDE: 95 mmol/L — ABNORMAL LOW (ref 96–111)

## 2016-03-26 LAB — TYPE AND SCREEN
ABO/RH(D): O POS
ANTIBODY SCREEN: NEGATIVE

## 2016-03-26 LAB — PTT (PARTIAL THROMBOPLASTIN TIME): APTT: 34.9 s (ref 25.1–36.5)

## 2016-03-26 SURGERY — ASCENDING AORTOGRAM

## 2016-03-26 MED ORDER — SODIUM CHLORIDE 0.9 % INTRAVENOUS SOLUTION
INTRAVENOUS | Status: DC
Start: 2016-03-26 — End: 2016-03-26
  Administered 2016-03-26: 0 via INTRAVENOUS

## 2016-03-26 MED ORDER — IOPAMIDOL 300 MG IODINE/ML (61 %) INTRAVENOUS SOLUTION
Freq: Once | INTRAVENOUS | Status: DC | PRN
Start: 2016-03-26 — End: 2016-03-26
  Administered 2016-03-26: 90 mL via INTRA_ARTERIAL

## 2016-03-26 MED ORDER — FENTANYL (PF) 50 MCG/ML INJECTION SOLUTION
Freq: Once | INTRAMUSCULAR | Status: DC | PRN
Start: 2016-03-26 — End: 2016-03-26
  Administered 2016-03-26 (×4): 25 ug via INTRAVENOUS

## 2016-03-26 MED ORDER — HEPARIN (PORCINE) 1,000 UNIT/ML INJECTION SOLUTION
Freq: Once | INTRAMUSCULAR | Status: DC | PRN
Start: 2016-03-26 — End: 2016-03-26
  Administered 2016-03-26: 2000 [IU] via INTRA_ARTERIAL

## 2016-03-26 MED ORDER — LIDOCAINE HCL 20 MG/ML (2 %) INJECTION SOLUTION
Freq: Once | INTRAMUSCULAR | Status: DC | PRN
Start: 2016-03-26 — End: 2016-03-26
  Administered 2016-03-26 (×2): 5 mL via INTRADERMAL

## 2016-03-26 MED ORDER — FAMOTIDINE (PF) 20 MG/2 ML INTRAVENOUS SOLUTION
20.0000 mg | Freq: Once | INTRAVENOUS | Status: AC
Start: 2016-03-26 — End: 2016-03-26
  Administered 2016-03-26: 20 mg via INTRAVENOUS
  Filled 2016-03-26: qty 2

## 2016-03-26 MED ORDER — NITROGLYCERIN 12.5 MG IN NS 250 ML
Freq: Once | Status: DC | PRN
Start: 2016-03-26 — End: 2016-03-26
  Administered 2016-03-26: 150 ug via INTRA_ARTERIAL
  Administered 2016-03-26: 100 ug via INTRA_ARTERIAL

## 2016-03-26 MED ORDER — HEPARIN (PORCINE) 1,000 UNIT/ML INJECTION SOLUTION
INTRAMUSCULAR | Status: AC
Start: 2016-03-26 — End: 2016-03-26
  Filled 2016-03-26: qty 10

## 2016-03-26 MED ORDER — LIDOCAINE HCL 20 MG/ML (2 %) INJECTION SOLUTION
INTRAMUSCULAR | Status: AC
Start: 2016-03-26 — End: 2016-03-26
  Filled 2016-03-26: qty 20

## 2016-03-26 MED ORDER — HYDROCORTISONE SOD SUCCINATE 100 MG/2 ML VIAL WRAPPER
100.0000 mg | Freq: Once | INTRAMUSCULAR | Status: AC
Start: 2016-03-26 — End: 2016-03-26
  Administered 2016-03-26: 100 mg via INTRAVENOUS
  Filled 2016-03-26: qty 2

## 2016-03-26 MED ORDER — VERAPAMIL 2.5 MG/ML INTRAVENOUS SOLUTION
INTRAVENOUS | Status: AC
Start: 2016-03-26 — End: 2016-03-26
  Filled 2016-03-26: qty 2

## 2016-03-26 MED ORDER — VERAPAMIL 2.5 MG/ML INTRAVENOUS SOLUTION
Freq: Once | INTRAVENOUS | Status: DC | PRN
Start: 2016-03-26 — End: 2016-03-26
  Administered 2016-03-26: 5 mg via INTRA_ARTERIAL

## 2016-03-26 MED ORDER — MIDAZOLAM 1 MG/ML INJECTION SOLUTION
INTRAMUSCULAR | Status: AC
Start: 2016-03-26 — End: 2016-03-26
  Filled 2016-03-26: qty 4

## 2016-03-26 MED ORDER — DIPHENHYDRAMINE 50 MG/ML INJECTION SOLUTION
50.0000 mg | Freq: Once | INTRAMUSCULAR | Status: AC
Start: 2016-03-26 — End: 2016-03-26
  Administered 2016-03-26: 50 mg via INTRAVENOUS
  Filled 2016-03-26: qty 1

## 2016-03-26 MED ORDER — MIDAZOLAM 1 MG/ML INJECTION SOLUTION
Freq: Once | INTRAMUSCULAR | Status: DC | PRN
Start: 2016-03-26 — End: 2016-03-26
  Administered 2016-03-26 (×4): 1 mg via INTRAVENOUS

## 2016-03-26 MED ORDER — FENTANYL (PF) 50 MCG/ML INJECTION SOLUTION
INTRAMUSCULAR | Status: AC
Start: 2016-03-26 — End: 2016-03-26
  Filled 2016-03-26: qty 2

## 2016-03-26 MED ADMIN — iopamidoL 61 % intravenous solution: INTRA_ARTERIAL | @ 09:00:00

## 2016-03-26 MED ADMIN — electrolyte-A intravenous solution: INTRAVENOUS | @ 08:00:00 | NDC 00338022104

## 2016-03-26 MED ADMIN — lactated Ringers intravenous solution: INTRAVENOUS | @ 08:00:00 | NDC 00338011704

## 2016-03-26 MED ADMIN — carvediloL 3.125 mg tablet: INTRAVENOUS | @ 09:00:00

## 2016-03-26 MED ADMIN — insulin lispro 100 unit/mL subcutaneous solution: INTRAVENOUS | @ 09:00:00

## 2016-03-26 MED ADMIN — montelukast 10 mg tablet: INTRAVENOUS | @ 08:00:00

## 2016-03-26 SURGICAL SUPPLY — 27 items
CATH ANGIO 5FR 145D PGTL CURVE 110CM EXPO FULL LGTH WRE BRD RBST SHAFT PERI CARDIAC TRILON (DIAGNOSTIC) ×2 IMPLANT
CATH ANGIO 5FR 145D PGTL CURVE_110CM EXPO FULL LGTH WRE BRD (DIAGNOSTIC) ×1
CATH ANGIO 5FR FL3.5 CURVE 100CM EXPO FEM TRILON (DIAGNOSTIC) ×2
CATH ANGIO 5FR FL3.5 CURVE 100_CM EXPO FULL LGTH WRE BRD RBST (DIAGNOSTIC) ×1
CATH ANGIO 5FR FR4 CURVE 100CM EXPO FULL LGTH WRE BRD RBST SHAFT VENTRIC TRILON (DIAGNOSTIC) ×2
CATH ANGIO 5FR FR4 CURVE 100CM_EXPO FULLLGTH WRE BRD RBST (DIAGNOSTIC) ×1
CATH ANGIO 6FR 145D PGTL CURVE .038IN 110CM LANGSTON 8 SH OUTR LUM 5 SH INNER LUM BRD GW PERI (DIAGNOSTIC) ×2 IMPLANT
CATH ANGIO 6FR 145D PGTL CURVE_.038IN 110CM LANGSTON 8 SH (DIAGNOSTIC) ×2
CATH ART LN SWANGANZ 7FR 110CM_STD 4 LUM TD VOL (Central Venous Lines) ×1
CATH PA 7FR 110CM SWANGANZ 4 LUM TD STD STRL LTX DISP (Central Venous Lines) ×2 IMPLANT
DEVICE COMPRESS TR BAND 24CM 2 BAL TRNSPR ADJ STRAP REG RADIAL ART (BALLOON) ×2 IMPLANT
DEVICE COMPRESS TR BAND 24CM 2_BAL TRNSPR ADJ STRAP AIR TRTN (BALLOON) ×1
DISCONTINUED NO SUB - GW .035IN 260CM FIX COR VAS 3MM J CURVE STRL LF  DISP ANGIO (WIRE) ×2 IMPLANT
DISCONTINUED USE 327881 - CATH ANGIO 5FR FL3.5 CURVE 100CM EXPO FEM TRILON (DIAGNOSTIC) ×2 IMPLANT
DISCONTINUED USE 329965 - CATH ANGIO 5FR FR4 CURVE 100CM EXPO FULL LGTH WRE BRD RBST SHAFT VENTRIC TRILON (DIAGNOSTIC) ×2 IMPLANT
GW .035IN 6CM 260CM STD TIP FI_COR EXCH WRE PTFE VAS 3MM (WIRE) ×1
GW GLDWR .035IN 3CM 150CM FLXB STR SHP TIP RADOPQ KINK RST NITINOL TUNG POLYUR HDRPH VAS STD (WIRE) ×2 IMPLANT
GW GLDWR .035IN 3CM 150CM FLXB_COR TO TIP RADOPQ STD SHFT (WIRE) ×1
GW GLDWR .035IN 3CM 180CM FLXB ATRAUMA TIP RADOPQ KINK RST NITINOL TUNG POLYUR HDRPH VAS 1.5MM J (WIRE) ×2 IMPLANT
GW GLDWR .035IN 3CM 180CM FLXB_ATRAUMA TIP RADOPQ KINK RST (WIRE) ×1
KIT 5 PORT MANIFOLD K0912454_15/BX (MISCELLANEOUS PT CARE ITEMS) ×3 IMPLANT
KIT ANGIO NAMIC MTS LHK STRL LF  DISP RNLD MEM HOSPITAL (MISCELLANEOUS PT CARE ITEMS) ×2 IMPLANT
KIT INTROD 10CM 6FR 22GA GLIDESHEATH SLNDR .021IN PLASTIC SHEATH DIL 2 WL PNCT SHORT ANG MINIWIRE 45 (GUIDING) ×2 IMPLANT
KIT INTROD 10CM 6FR 22GA GLIDE_SHEATH SLNDR .021IN PLASTIC (GUIDING) ×1
KIT INTROD 10CM 7FR 22GA GLIDESHEATH SLNDR .021IN 25MM SHORT ANG PLASTIC HDRPH SHEATH DIL 2 WL PNCT (GUIDING) ×2 IMPLANT
KIT INTROD 10CM 7FR 22GA GLIDE_SHEATH SLNDR .021IN 25MM SHORT (GUIDING) ×1
MANIFOLD CARIDAC 4V DYE SET (MISCELLANEOUS PT CARE ITEMS) ×1

## 2016-03-26 NOTE — Nurses Notes (Signed)
Discharge instructions provided to pt. All questions answered. Pt ate and ambulated without complications. Pt discharged to home.

## 2016-03-26 NOTE — Nurses Notes (Signed)
Pt admitted to [facility] pre procedure. Pt dressed, IV started, and labs sent.

## 2016-03-26 NOTE — Nurses Notes (Signed)
Pt returned to [facility] post procedure. Brachial sheath pulled @ bedside by [name]. No other complaints.

## 2016-03-26 NOTE — H&P (Signed)
[facility]                                                     H&P Update Form    [name],[name], 60 y.o. female  Date of Admission:  03/26/2016  Date of Birth:  1956/03/24    03/26/2016    STOP: IF H&P IS GREATER THAN 30 DAYS FROM SURGICAL DAY COMPLETE NEW H&P IS REQUIRED.     H & P updated the day of the procedure.  1.  H&P completed within 30 days of surgical procedure by Dr. Earlie Raveling on 03/04/2016  and has been reviewed within 24 hours of the surgery, the patient has been examined, and no change has occured in the patients condition since the H&P was completed.       Change in medications: No      Last Menstrual Period: Post-Menopausal      Comments:     2.  Patient continues to be appropiate candidate for planned surgical procedure. YES      Edsel Petrin, MD    Interventional Cardiology Fellow, Section of Cardiology  [facility]      03/26/2016  I saw and examined the patient.  I reviewed the fellow's note.  I agree with the findings and plan of care as documented in the fellow's note.  Any exceptions/additions are edited/noted.    Marinda Elk, MD

## 2016-03-26 NOTE — Discharge Instructions (Signed)
Discharge Instructions after Your Cardiac Catheterization with Radial Approach      Activity  For the next 24 hours  . You may feel sleepy.  Do not drive or operate machinery or power tools.  (You must have someone take you home)  . Do not drink any alcoholic beverages  . Do not make any important decisions or sign any legal documents  . Avoid any pushing or pulling motions with affected wrist for 3 days  . Do not lift with the affected arm for 48 hours  . Follow your doctors instructions about returning to work      Care of Wound  . Keep the dressing in place for 24 hours.  You may remove the dressing and shower after 24 hours.  . Cleanse the site gently with soap and water. Pat area dry. No lotions or creams.  Leave the site open to air.  . Do not get your wrist saturated for 5-7 days or until the wound is healed.      Diet  . You may resume your normal diet.  . To help eliminate contrast material from your body, we encourage you to increase your fluid intake   If you are taking Metformin (Glucophage) hold this medication for 3 days and then resume as prescribed.    Call your doctor if you have:  . If bleeding or a hard bruise develops under the skin, apply manual pressure and call 911 immediately.  . Signs that you may be bleeding are: tightness, stinging, pain, warmness, wetness where the puncture site was put in your wrist.    . If bleeding occurs, lie down and apply firm pressure about 2 fingers above the site.  Call 911 and keep pressure on the site.  . Severe Pain, numbness, tingling, change in color or coldness of the arm used for puncture site.   . Temperature greater than 101 degrees F.  . Redness around the site.  . Drainage from the site.

## 2016-04-01 LAB — POCT ISTAT G3 BLOOD GAS (ARTERIAL - RESULTS)
BASE EXCESS (BEP): 1 mmol/L (ref ?–2.0)
BASE EXCESS (BEP): 2 mmol/L (ref ?–2.0)
HCO3 (HCO3P): 24 mmol/L (ref 22–26)
HCO3 (HCO3P): 25 mmol/L (ref 22–26)
PCO2 (PCO2P): 28 mmHg — CL (ref 35–45)
PCO2 (PCO2P): 33 mmHg — ABNORMAL LOW (ref 35–45)
PH (PHP): 7.49 — ABNORMAL HIGH (ref 7.35–7.45)
PH (PHP): 7.53 — ABNORMAL HIGH (ref 7.35–7.45)
PO2 (PO2P): 125 mmHg — ABNORMAL HIGH (ref 72–100)
PO2 (PO2P): 37 mmHg — CL (ref 72–100)
SO2 (SO2P): 75 % — CL (ref >=80)
SO2 (SO2P): 99 % (ref >=80)
TCO2 (TOC2P): 24 mmol/L (ref 22–32)
TCO2 (TOC2P): 26 mmol/L (ref 22–32)

## 2016-04-07 ENCOUNTER — Encounter (HOSPITAL_BASED_OUTPATIENT_CLINIC_OR_DEPARTMENT_OTHER): Payer: Self-pay | Admitting: Cardiovascular Disease

## 2016-04-29 ENCOUNTER — Encounter (HOSPITAL_BASED_OUTPATIENT_CLINIC_OR_DEPARTMENT_OTHER): Payer: Self-pay | Admitting: Cardiovascular Disease

## 2016-04-29 ENCOUNTER — Ambulatory Visit: Payer: 59 | Attending: Cardiovascular Disease | Admitting: Cardiovascular Disease

## 2016-04-29 VITALS — BP 124/78 | HR 103 | Resp 14 | Ht 65.0 in | Wt 198.0 lb

## 2016-04-29 DIAGNOSIS — Z79899 Other long term (current) drug therapy: Secondary | ICD-10-CM | POA: Insufficient documentation

## 2016-04-29 DIAGNOSIS — I350 Nonrheumatic aortic (valve) stenosis: Principal | ICD-10-CM | POA: Insufficient documentation

## 2016-04-29 DIAGNOSIS — Z87891 Personal history of nicotine dependence: Secondary | ICD-10-CM | POA: Insufficient documentation

## 2016-04-29 DIAGNOSIS — I10 Essential (primary) hypertension: Secondary | ICD-10-CM | POA: Insufficient documentation

## 2016-04-29 DIAGNOSIS — R011 Cardiac murmur, unspecified: Secondary | ICD-10-CM | POA: Insufficient documentation

## 2016-04-29 MED ORDER — METOPROLOL SUCCINATE ER 25 MG TABLET,EXTENDED RELEASE 24 HR
12.5000 mg | ORAL_TABLET | Freq: Every day | ORAL | 3 refills | Status: DC
Start: 2016-04-29 — End: 2016-05-14

## 2016-04-29 NOTE — Progress Notes (Addendum)
[name]  Allison [name], [place] [address]    Requesting Physician: Marinda Elk, MD  History of Present Illness  [name] is a 60 y.o. female who presents to the cardiology clinic for follow up post cath for evaluation of aortic stenosis.   Her heart cath on 03/26/2016 revealed a mean AV gradient of 36 mm/Hg and her AVA is documented at 0.93 cm2.. Today, she denies any shortness of breath, chest pain, edema, gasping, dizziness, or syncope. Her PMH is significant for HTN. She has a somewhat limited level of activity as she is anticipating hip replacement surgery.   Due to her moderate aortic stenosis further evaluation was requested prior to hip replacement. She has stopped taking HCTZ due to dizziness and low BP.     Past History  Current Outpatient Prescriptions   Medication Sig    cholecalciferol, vitamin D3, 1,000 unit Oral Tablet Take 1,000 Units by mouth Once a day    cyanocobalamin (VITAMIN B-12) 100 mcg Oral Tablet Take 100 mcg by mouth Once a day    hydroCHLOROthiazide (HYDRODIURIL) 25 mg Oral Tablet Take 1 Tab (25 mg total) by mouth Once a day (Patient not taking: Reported on 04/29/2016)    metoprolol succinate (TOPROL-XL) 25 mg Oral Tablet Sustained Release 24 hr Take 0.5 Tabs (12.5 mg total) by mouth Once a day    multivitamin Oral Tablet Take 1 Tab by mouth Once a day    naproxen (NAPROSYN) 500 mg Oral Tablet Take 1 Tab (500 mg total) by mouth Twice daily with food Schedule apt     Allergies   Allergen Reactions    Iv Contrast Anaphylaxis    Oregano     Stadol [Butorphanol Tartrate] Mental Status Effect    Tomato Hives/ Urticaria     Past Medical History:   Diagnosis Date    High blood pressure          Past Surgical History:   Procedure Laterality Date    HX CESAREAN SECTION  1984    HX HERNIA REPAIR  2004, 2006, 2008    x3    HX HYSTERECTOMY  2003         Family History  Family History   Problem Relation Age of Onset    Heart  Disease Mother     Sudden Death no cause Mother     Hypertension Father     No Known Problems Sister     Cancer Brother     No Known Problems Sister          Social History  Social History     Social History    Marital status: Single     Spouse name: N/A    Number of children: N/A    Years of education: N/A     Social History Main Topics    Smoking status: Former Smoker     Packs/day: 1.00     Years: 35.00    Smokeless tobacco: Never Used    Alcohol use Yes      Comment: social only    Drug use: No    Sexual activity: Not on file     Other Topics Concern    Not on file     Social History Narrative     Review of Systems  Other than ROS in the HPI, all other review of systems were negative except for: Cardiovascular: positive for murmur, HTN.   Examination  BP 124/78   Pulse (!) 103   Resp 14   Ht 1.651 m (5\' 5" )   Wt 89.8 kg (197 lb 15.6 oz)   SpO2 96%   BMI 32.94 kg/m2  General: Appears in good health, NAD  Eyes: Conjunctiva clear, PEARL   Neck: No JVD, no thyromegaly   Lungs: Clear to auscultation, respiratory effort non labored  Cardiovascular:  PMI not palpated. Grade III/VI systolic murmur, loudest in right upper sternal border, S2 is audible. Referred to carotids bilaterally. Regular rhythm, no rub or gallop,    Vascular : 2+ pulse throughout  Abdomen: Soft, non tender  Extremities: No cyanosis or edema  Skin: Skin warm and dry  Neurologic: Grossly normal  Lymphatics: No lymphadenopathy  Psychiatric: Normal affect, behavior, memory, thought content, judgement, and speech.    PROCEDURES:   Heart Cath: 03/26/2016  IMPRESSION:   1.  Mild, nonobstructive coronary artery disease.   2.  Moderate-to-severe aortic stenosis by valve gradients.   3.  Normal pulmonary artery pressure.  RA: Mean of 4 mmHg  RVSP: 24 mmHg, RVEDP: 4 mmHg  PA: 28/8 with a mean of 16 mmHg.   Pulmonary capillary wedge pressure: Mean of 6 mmHg.   Central aorta: 135/75 with a mean of 100 mmHg.  LV: LV systolic pressure of 0000000 mmHg,  LVEDP: 25 mmHg.  AORTIC VALVE:  Mean gradient: 36 mmHg.  Valve Area: 0.93 cm^2.  Diagnosis and Plan  1. Aortic valve stenosis, unspecified etiology    2. Essential hypertension      1. Aortic stenosis - refer to Dr. Lacinda Axon, [facility] for opinion regarding aortic valve replacement surgery prior to proceeding with hip replacement surgery. AV stenosis is moderate to severe, but the patient remains largely asymptomatic with her low level of    activity.   2. HTN: Blood pressure controlled. Add Toprol XL 12.5 mg daily for HR and BP control.   3. Return in 6 months.      Orders Placed This Encounter    Referral to Cardiac Surgery    metoprolol succinate (TOPROL-XL) 25 mg Oral Tablet Sustained Release 24 hr     [name] is a 60 y.o. female who presents to the cardiology clinic for follow up post cath.     I am scribing for, and in the presence of, Janel Trovato-Vass, APP for services provided on 04/29/2016.  Gypsy Decant, SCRIBE     I personally performed the services described in this documentation, as scribed  in my presence, and it is both accurate  and complete.    Janel Christine Trovato-Vass, APRN      Please CC:  Derek Jack, APRN  [address]  [place] [address]    I saw and examined the patient.  I reviewed the APP's note.  I agree with the findings and plan of care as documented in the APP's note.  Any exceptions/additions are edited/noted.    Joaquin Bend, MD

## 2016-04-30 ENCOUNTER — Encounter (HOSPITAL_BASED_OUTPATIENT_CLINIC_OR_DEPARTMENT_OTHER): Payer: Self-pay | Admitting: Cardiovascular Disease

## 2016-05-07 ENCOUNTER — Ambulatory Visit (HOSPITAL_COMMUNITY): Payer: Self-pay | Admitting: THORACIC SURGERY CARDIOTHORACIC VASCULAR SURGERY

## 2016-05-14 ENCOUNTER — Encounter (HOSPITAL_COMMUNITY): Payer: Self-pay | Admitting: THORACIC SURGERY CARDIOTHORACIC VASCULAR SURGERY

## 2016-05-14 ENCOUNTER — Encounter (HOSPITAL_BASED_OUTPATIENT_CLINIC_OR_DEPARTMENT_OTHER): Payer: Self-pay | Admitting: Orthopaedic Surgery

## 2016-05-14 ENCOUNTER — Encounter (HOSPITAL_BASED_OUTPATIENT_CLINIC_OR_DEPARTMENT_OTHER): Payer: Self-pay | Admitting: Family

## 2016-05-14 ENCOUNTER — Ambulatory Visit
Payer: 59 | Attending: THORACIC SURGERY CARDIOTHORACIC VASCULAR SURGERY | Admitting: THORACIC SURGERY CARDIOTHORACIC VASCULAR SURGERY

## 2016-05-14 DIAGNOSIS — I350 Nonrheumatic aortic (valve) stenosis: Secondary | ICD-10-CM | POA: Insufficient documentation

## 2016-05-14 DIAGNOSIS — I10 Essential (primary) hypertension: Secondary | ICD-10-CM | POA: Insufficient documentation

## 2016-05-14 DIAGNOSIS — I2510 Atherosclerotic heart disease of native coronary artery without angina pectoris: Secondary | ICD-10-CM | POA: Insufficient documentation

## 2016-05-14 NOTE — Patient Instructions (Addendum)
Cardiothoracic Surgery  [place], [id number]  [place], [place] [address]  Phone / [phone]  Fax / [phone]      Please follow-up in 3 months with Dr. Lacinda Axon to discuss definitive plan and complete preoperative testing once your hip surgery has been completed. Until this appointment, please think of the information given to you regarding valve types (tissue vs mechanical) to have decision with next appointment if possible.    Surgical consent will need to be completed.    Pre-admission testing will need completed prior to surgery.    Dental clearance was given. Please complete and fax [phone]    Referring physician: Dr. Earlie Raveling    If you have any questions regarding pre-operative instructions please call:  Lawanda Cousins, RN at [phone] or  Regan Lemming, RN at [phone]

## 2016-05-15 NOTE — H&P (Signed)
Requesting Physician: Joaquin Bend, MD  PCP: Derek Jack, APRN  Reason for Consult  Aortic Stenosis      History of Present Illness     [name] is a 60 yo  Female with PMH of HTN who presented to cardiology due to a heart murmur heard during her preop clearance for hip surgery. An echo revealed moderate to severe aortic stenosis. She is referred to our clinic to discuss surgical options, and timing around her hip implant. [name] has no cardiac history. She denies SOB, chest pain, edema, or syncope. Her activity is very limited, however, due to decrease in mobility from hip degeneration.  She is not a dabetic, does not smoke, and has no significant family history. Her heart cath on 03/26/16 shows mild, nonobstructive CAD.  Per TTE, AV peak gradient is 54.96 mmHg with mild AI, no dilation of ventricles, and normal LVEF.      Past History   comprehensive (3)  Past Surgical History:   Procedure Laterality Date    HX CESAREAN SECTION  1984    HX HERNIA REPAIR  2004, 2006, 2008    x3    HX HYSTERECTOMY  2003         Past Medical History:   Diagnosis Date    High blood pressure          Current Outpatient Prescriptions   Medication Sig    cholecalciferol, vitamin D3, 1,000 unit Oral Tablet Take 1,000 Units by mouth Once a day    cyanocobalamin (VITAMIN B-12) 100 mcg Oral Tablet Take 100 mcg by mouth Once a day    multivitamin Oral Tablet Take 1 Tab by mouth Once a day    naproxen (NAPROSYN) 500 mg Oral Tablet Take 1 Tab (500 mg total) by mouth Twice daily with food Schedule apt     Allergies   Allergen Reactions    Iv Contrast Anaphylaxis    Oregano     Stadol [Butorphanol Tartrate] Mental Status Effect    Tomato Hives/ Urticaria       Cardiac History   Cardiac Risk Factors:    Family history of premature CAD: No  Diabetes Mellitus (HbA1C >6.5, Fasting >126 or Random glucose >200 with hyperglycemic symptoms): No  Currently on dialysis: No  Dyslipidemia (total chol >200, LDL? 130,  HDL men <40, HDL women <50): No  Hypertension 140/90 or 130/80 with DM or CKD): Yes  Home oxygen: No  Chronic lung disease (asbestosis, mesothelioma, black lung, radiation induced pneumonitis/fibrosis,  COPD, chronic bronchitis, emphysema, treated with chronic inhaled meds): No  Pneumonia: No  Cigarette smoker (within a year of surgery): No  Other tobacco use (withina year of surgery): No  Sleep apnea and uses Bipap: No  Inhaled medication or oral bronchodilator therapy at home: No  Immunocompromised at present (systemic steroids, chemo, anti-rejection meds): No  Unresponsive neurological state: No  Liver disease (chronic ETOH, Hep B, Hep C, cirrhosis, portal HTN, esophageal varices, congestive hepatopathy): No  Alcohol use: <=1 drink/week  Illicit drug use (does not include rare historical use): No  Infectious Endocarditis: No  Syncope (cardiac related within 1 year of surgery): No  Cerebrovascular disease: No.   History of previous carotid artery surgery and/or stenting: No  Peripheral artery disease (claudication, amputation, vascular reconstruction, aortic aneurysm.  THIS DOES NOT include the carotic or cerebral vascular arteries or thoracic aneurysms): No  Mediastinal radiation: No  Cancer within 5 years (doesn't include basal cell or squamous cell CA):  No    Cardiac Status:    Prior MI: No  CAD presentation: Symptoms unlikely to be ischemic (14 days)  Prior CABG: No   Prior valve surgery/procedure: No  Prior PCI: No  Previous arrythmia: None  Prior arrythmia surgery (MAZE or ablation): No  Previous congenital: No  Previous ICD: No  Previous pacemaker: No  Other previous cardiovascular intervention: No  Prior heart failure: No  Heart failure within 2 weeks: No      Family History     Family History   Problem Relation Age of Onset    Heart Disease Mother     Sudden Death no cause Mother     Hypertension Father     No Known Problems Sister     Cancer Brother     No Known Problems Sister            Family  history (parents/siblings/children) of CAD/MI/Sudden death without cause for males under age of 102:   Negative.        Females under the age of 77:  Negative      Review of Systems   A comprehensive review of systems was negative.      Examination   BP (!) 145/82   Pulse 98   Temp 36.6 C (97.9 F) (Oral)    Ht 1.66 m (5' 5.35")   Wt 90.5 kg (199 lb 8.3 oz)   SpO2 97%   BMI 32.84 kg/m2  General appearance: alert, cooperative, no distress, appears stated age  Head: Normocephalic, without obvious abnormality, atraumatic  Eyes: positive findings: sclera non-icteric  Throat: Lips, mucosa, and tongue normal. Teeth and gums normal  Lungs: clear to auscultation bilaterally  systolic murmur: holosystolic 2/6, crescendo at 2nd right intercostal space  Abdomen: soft, non-tender. Bowel sounds normal. No masses,  no organomegaly  Extremities: extremities normal, atraumatic, no cyanosis or edema  Skin: Skin color, texture, turgor normal. No rashes or lesions  Neurologic: Gait: antalgic  Patient will need dental clearance prior to surgery.      Data Reviewed       Cath report date: 03/26/16    IMPRESSION:   1.  Mild, nonobstructive coronary artery disease.   2.  Moderate-to-severe aortic stenosis by valve gradients.   3.  Normal pulmonary artery pressure.    EKG:    Echo (2d or TEE):     Summary    Left ventricle: The systolic function is normal with an ejection fraction range of 60% - 65%. No regional wall motion abnormality identified. LV diastolic function is abnormal and dysfunction is moderate. Abnormal relaxation with increased filling pressures. Doppler parameters are consistent with high ventricular filling pressure.   Aortic valve: The AV motion is restricted.The aortic valve stenosis is moderate-to-severe and regurgitation is mild.Reduced AV cusp separation.           Diagnosis and Plan     60 yo female with PMH of HTN with asymptomatic moderate-severe aortic stenosis.     Dr. Lacinda Axon discussed the risks and benefits of  surgery, especially in relation to her hip surgery. Dr. Lacinda Axon felt that her valve does need an operation at some point, but because she is asymptomatic without cardiomyopathic changes, it could be done before or after her hip replacement.  [name] expressed understanding of this, and wishes to proceed with hip replacement first. From a cardiac standpoint, she is cleared to have her hip surgery.    She will return to our clinic in approximately  6 months with repeat echocardiogram. She will also need dental valve surgery, and need to make a decision on mechanical vs tissue valve.     STS RISK Scores:  Mortality = 0.777%  Morbidity/Mortality = 7.447%        ICD-10-CM    1. Aortic valve stenosis, unspecified etiology I35.0 Referral to Cardiac Surgery   2. Essential hypertension I10 Referral to Cardiac Surgery         Frances Furbish, PA-C 05/15/2016, 13:00

## 2016-06-03 ENCOUNTER — Encounter (HOSPITAL_BASED_OUTPATIENT_CLINIC_OR_DEPARTMENT_OTHER): Payer: Self-pay | Admitting: Family

## 2016-06-03 ENCOUNTER — Other Ambulatory Visit (HOSPITAL_BASED_OUTPATIENT_CLINIC_OR_DEPARTMENT_OTHER): Payer: Self-pay | Admitting: Family

## 2016-06-03 DIAGNOSIS — M25552 Pain in left hip: Secondary | ICD-10-CM

## 2016-06-03 MED ORDER — NAPROXEN 500 MG TABLET
500.00 mg | ORAL_TABLET | Freq: Two times a day (BID) | ORAL | 0 refills | Status: DC
Start: 2016-06-03 — End: 2016-06-17

## 2016-06-03 NOTE — Telephone Encounter (Signed)
Refill Request Note  Patient requests refill on listed medications.  Last visit at [facility]:  02/12/2016  Next pending visit [facility]: 06/17/2016    Medication pended to provider for approval.    Odis Hollingshead, LPN [id number], [phone]

## 2016-06-03 NOTE — Telephone Encounter (Signed)
Refill Request Note  pharmacy requests refill on listed medications.  Last visit at [facility]:  02/12/2016  Next pending visit [facility]: 06/17/2016    Medication pended to provider for approval.    Odis Hollingshead, LPN [id number], [id number]

## 2016-06-03 NOTE — Telephone Encounter (Signed)
From: [name]  To: Derek Jack, APRN  Sent: 06/03/2016 9:31 AM EDT  Subject: Medication Renewal Request    Original authorizing provider: Derek Jack, APRN    [name] would like a refill of the following medications:  naproxen (NAPROSYN) 500 mg Oral Tablet [Emily June Leap, APRN]    Preferred pharmacy: [facility], [place] [address]    Comment:

## 2016-06-17 ENCOUNTER — Ambulatory Visit
Admission: RE | Admit: 2016-06-17 | Discharge: 2016-06-17 | Disposition: A | Discharge status: Home / Self Care | Payer: 59 | Source: Ambulatory Visit | Attending: ORTHOPEDIC, SPORTS MEDICINE | Admitting: ORTHOPEDIC, SPORTS MEDICINE

## 2016-06-17 ENCOUNTER — Encounter (HOSPITAL_BASED_OUTPATIENT_CLINIC_OR_DEPARTMENT_OTHER): Payer: Self-pay | Admitting: ORTHOPEDIC, SPORTS MEDICINE

## 2016-06-17 ENCOUNTER — Ambulatory Visit (HOSPITAL_BASED_OUTPATIENT_CLINIC_OR_DEPARTMENT_OTHER): Payer: 59 | Admitting: Gynecology

## 2016-06-17 ENCOUNTER — Ambulatory Visit (HOSPITAL_BASED_OUTPATIENT_CLINIC_OR_DEPARTMENT_OTHER): Payer: 59 | Admitting: ORTHOPEDIC, SPORTS MEDICINE

## 2016-06-17 VITALS — BP 134/80 | HR 87 | Temp 97.9°F | Ht 65.35 in | Wt 199.3 lb

## 2016-06-17 DIAGNOSIS — Z01818 Encounter for other preprocedural examination: Secondary | ICD-10-CM

## 2016-06-17 DIAGNOSIS — Z87891 Personal history of nicotine dependence: Secondary | ICD-10-CM | POA: Insufficient documentation

## 2016-06-17 DIAGNOSIS — M1610 Unilateral primary osteoarthritis, unspecified hip: Secondary | ICD-10-CM | POA: Insufficient documentation

## 2016-06-17 DIAGNOSIS — I358 Other nonrheumatic aortic valve disorders: Secondary | ICD-10-CM | POA: Insufficient documentation

## 2016-06-17 DIAGNOSIS — Z791 Long term (current) use of non-steroidal anti-inflammatories (NSAID): Secondary | ICD-10-CM | POA: Insufficient documentation

## 2016-06-17 LAB — COMPREHENSIVE METABOLIC PANEL, NON-FASTING
ALBUMIN: 4.2 g/dL (ref 3.4–4.8)
ALKALINE PHOSPHATASE: 70 U/L (ref <150)
ALT (SGPT): 12 U/L (ref <55)
ALT (SGPT): 12 U/L (ref <55)
ANION GAP: 5 mmol/L (ref 4–13)
AST (SGOT): 17 U/L (ref 8–41)
BILIRUBIN TOTAL: 0.4 mg/dL (ref 0.3–1.3)
BUN/CREA RATIO: 24 — ABNORMAL HIGH (ref 6–22)
BUN: 18 mg/dL (ref 8–25)
CALCIUM: 9.4 mg/dL (ref 8.5–10.2)
CALCIUM: 9.4 mg/dL (ref 8.5–10.2)
CHLORIDE: 101 mmol/L (ref 96–111)
CO2 TOTAL: 29 mmol/L (ref 22–32)
CREATININE: 0.76 mg/dL (ref 0.49–1.10)
ESTIMATED GFR: >59 mL/min/1.73mˆ2 (ref >59)
GLUCOSE: 104 mg/dL (ref 65–139)
POTASSIUM: 4.9 mmol/L (ref 3.5–5.1)
PROTEIN TOTAL: 7.4 g/dL (ref 5.6–7.6)
SODIUM: 135 mmol/L — ABNORMAL LOW (ref 136–145)

## 2016-06-17 LAB — PREALBUMIN
PREALBUMIN: 24.7 mg/dL (ref 18.0–45.0)
PREALBUMIN: 24.7 mg/dL (ref 18.0–45.0)

## 2016-06-17 LAB — CBC WITH DIFF
BASOPHIL #: 0.04 x10ˆ3/uL (ref 0.00–0.20)
BASOPHIL %: 1 %
EOSINOPHIL #: 0.11 x10ˆ3/uL (ref 0.00–0.50)
EOSINOPHIL %: 2 %
HCT: 39.3 % (ref 33.5–45.2)
HGB: 13.5 g/dL (ref 11.2–15.2)
HGB: 13.5 g/dL (ref 11.2–15.2)
LYMPHOCYTE #: 1.8 x10ˆ3/uL (ref 1.00–4.80)
LYMPHOCYTE %: 27 %
MCH: 30.9 pg (ref 27.4–33.0)
MCHC: 34.2 g/dL (ref 32.5–35.8)
MCV: 90.1 fL (ref 78.0–100.0)
MONOCYTE #: 0.51 x10ˆ3/uL (ref 0.30–1.00)
MONOCYTE %: 8 %
MPV: 7.1 fL — ABNORMAL LOW (ref 7.5–11.5)
NEUTROPHIL #: 4.24 x10ˆ3/uL (ref 1.50–7.70)
NEUTROPHIL %: 63 %
NEUTROPHIL %: 63 %
PLATELETS: 245 x10ˆ3/uL (ref 140–450)
RBC: 4.36 x10ˆ6/uL (ref 3.63–4.92)
RDW: 12.4 % (ref 12.0–15.0)
WBC: 6.7 x10ˆ3/uL (ref 3.5–11.0)

## 2016-06-17 LAB — PTT (PARTIAL THROMBOPLASTIN TIME): APTT: 31.2 s (ref 25.1–36.5)

## 2016-06-17 LAB — URINALYSIS, MACROSCOPIC
BILIRUBIN: NEGATIVE mg/dL
BLOOD: NEGATIVE mg/dL
COLOR: NORMAL
GLUCOSE: NEGATIVE mg/dL
KETONES: NEGATIVE mg/dL
LEUKOCYTES: NEGATIVE WBCs/uL
NITRITE: NEGATIVE
PH: 5 (ref 5.0–8.0)
PROTEIN: NEGATIVE mg/dL
SPECIFIC GRAVITY: 1.014 (ref 1.005–1.030)
UROBILINOGEN: NEGATIVE mg/dL

## 2016-06-17 LAB — URINALYSIS, MICROSCOPIC
RBCS: <1 /HPF (ref <6.0)
WBCS: 1 /HPF (ref <11.0)
WBCS: 1 /hpf (ref <11.0)

## 2016-06-17 LAB — PT/INR
INR: 1.08 (ref 0.80–1.20)
PROTHROMBIN TIME: 12.2 s (ref 9.0–13.6)

## 2016-06-17 MED ORDER — NAPROXEN 500 MG TABLET
ORAL_TABLET | ORAL | 0 refills | Status: DC
Start: 2016-06-17 — End: 2016-08-14

## 2016-06-17 NOTE — Progress Notes (Signed)
SUBJECTIVE:  [name] is a 60 y.o. female is here today for Pre-op (Left Hip)   She has seen Dr. Sharlene Motts for her hip and she would like to proceed with a hip arthroplasty.  She had the incidental finding of a heart murmur which was identified to be moderate-to-severe aortic stenosis.  She had an EKG, echo, and heart catheterization.  She has met with Cardiothoracic surgery.  I have reviewed their notes.  They recommend that she have about surgery but states that she could have this either before or after her hip surgery.  She denies any cardiac symptoms.  Specifically, she denies any shortness of breath, fatigue, chest pain, pressure, syncope or presyncope.    Past Medical History:   Diagnosis Date    High blood pressure        Past Surgical History:   Procedure Laterality Date    HX CESAREAN SECTION  1984    HX HERNIA REPAIR  2004, 2006, 2008    x3    HX HYSTERECTOMY  2003     Allergies   Allergen Reactions    Iv Contrast Anaphylaxis    Oregano     Stadol [Butorphanol Tartrate] Mental Status Effect    Tomato Hives/ Urticaria     Social History     Social History    Marital status: Single     Spouse name: N/A    Number of children: N/A    Years of education: N/A     Occupational History    Not on file.     Social History Main Topics    Smoking status: Former Smoker     Packs/day: 1.00     Years: 35.00    Smokeless tobacco: Never Used    Alcohol use Yes      Comment: social only    Drug use: No    Sexual activity: Not on file     Other Topics Concern    Not on file     Social History Narrative       Current Outpatient Prescriptions   Medication Sig    cholecalciferol, vitamin D3, 1,000 unit Oral Tablet Take 1,000 Units by mouth Once a day    cyanocobalamin (VITAMIN B-12) 100 mcg Oral Tablet Take 100 mcg by mouth Once a day    multivitamin Oral Tablet Take 1 Tab by mouth Once a day    naproxen (NAPROSYN) 500 mg Oral Tablet TAKE 1 TABLET BY MOUTH TWICE DAILY WITH FOOD    [DISCONTINUED]  naproxen (NAPROSYN) 500 mg Oral Tablet TAKE 1 TABLET BY MOUTH TWICE DAILY WITH FOOD (PLEASE SCHEDULE APPOINTMENT WITH PHYSICIAN)    [DISCONTINUED] naproxen (NAPROSYN) 500 mg Oral Tablet Take 1 Tab (500 mg total) by mouth Twice daily with food Schedule apt     ROS: Pertinent items are noted in HPI.  OBJECTIVE:   Vitals: BP 134/80   Pulse 87   Temp 36.6 C (97.9 F) (Oral)    Ht 1.66 m (5' 5.35")   Wt 90.4 kg (199 lb 4.7 oz)   BMI 32.81 kg/m2   Appearance:in no apparent distress  Exam: Eyes: conjunctivae/corneas clear. PERRL, EOM's intact. Fundi benign  Throat: Lips, mucosa, and tongue normal. Teeth and gums normal  Neck: supple, symmetrical, trachea midline, no adenopathy, thyroid: not enlarged  Lungs: clear to auscultation bilaterally  Heart: regular rate and rhythm, systolic murmur  Abdomen: soft, non-tender. Bowel sounds normal. No masses,  no organomegaly  Extremities: extremities normal, atraumatic, no cyanosis  or edema  Pulses: 2+ and symmetric  Skin: Skin color, texture, turgor normal. No rashes or lesions      ASSESSMENT:     ICD-10-CM    1. Pre-op evaluation Z01.818 CBC/DIFF     COMPREHENSIVE METABOLIC PANEL, NON-FASTING     PTT (PARTIAL THROMBOPLASTIN TIME)     PT/INR     Prealbumin     Albumin     Urinalysis w/ Reflex Culture     XR CHEST PA AND LATERAL     PLAN:  Lab results reviewed.  She has met with cardiology and was cleared for hip surgery before cardiac surgery but will need surgery for both.   She is medically stable and "cleared" for surgery.      Corena Pilgrim, MD  [facility]  FAMILY MEDICINE, [facility]  [place] [address]  [phone]

## 2016-07-14 ENCOUNTER — Ambulatory Visit (HOSPITAL_BASED_OUTPATIENT_CLINIC_OR_DEPARTMENT_OTHER): Payer: Self-pay | Admitting: Orthopaedic Surgery

## 2016-08-04 ENCOUNTER — Ambulatory Visit (HOSPITAL_BASED_OUTPATIENT_CLINIC_OR_DEPARTMENT_OTHER): Payer: 59

## 2016-08-04 ENCOUNTER — Ambulatory Visit
Admission: RE | Admit: 2016-08-04 | Discharge: 2016-08-04 | Disposition: A | Discharge status: Home / Self Care | Payer: 59 | Source: Ambulatory Visit | Attending: Orthopaedic Surgery | Admitting: Orthopaedic Surgery

## 2016-08-04 ENCOUNTER — Encounter (HOSPITAL_BASED_OUTPATIENT_CLINIC_OR_DEPARTMENT_OTHER): Payer: Self-pay | Admitting: Orthopaedic Surgery

## 2016-08-04 ENCOUNTER — Encounter (HOSPITAL_BASED_OUTPATIENT_CLINIC_OR_DEPARTMENT_OTHER): Payer: Self-pay

## 2016-08-04 ENCOUNTER — Ambulatory Visit (HOSPITAL_BASED_OUTPATIENT_CLINIC_OR_DEPARTMENT_OTHER)
Admission: RE | Admit: 2016-08-04 | Discharge: 2016-08-04 | Disposition: A | Discharge status: Home / Self Care | Payer: 59 | Source: Ambulatory Visit

## 2016-08-04 ENCOUNTER — Ambulatory Visit (HOSPITAL_BASED_OUTPATIENT_CLINIC_OR_DEPARTMENT_OTHER)
Admission: RE | Admit: 2016-08-04 | Discharge: 2016-08-04 | Disposition: A | Discharge status: Home / Self Care | Payer: 59 | Source: Ambulatory Visit | Attending: Physician Assistant | Admitting: Physician Assistant

## 2016-08-04 ENCOUNTER — Ambulatory Visit (HOSPITAL_BASED_OUTPATIENT_CLINIC_OR_DEPARTMENT_OTHER): Payer: 59 | Admitting: Orthopaedic Surgery

## 2016-08-04 VITALS — BP 150/83 | HR 71 | Temp 96.8°F | Ht 65.12 in | Wt 199.7 lb

## 2016-08-04 DIAGNOSIS — M160 Bilateral primary osteoarthritis of hip: Secondary | ICD-10-CM

## 2016-08-04 DIAGNOSIS — M79609 Pain in unspecified limb: Secondary | ICD-10-CM

## 2016-08-04 DIAGNOSIS — M1612 Unilateral primary osteoarthritis, left hip: Secondary | ICD-10-CM

## 2016-08-04 DIAGNOSIS — Z01818 Encounter for other preprocedural examination: Secondary | ICD-10-CM | POA: Insufficient documentation

## 2016-08-04 DIAGNOSIS — Z87891 Personal history of nicotine dependence: Secondary | ICD-10-CM | POA: Insufficient documentation

## 2016-08-04 HISTORY — DX: Endocarditis, valve unspecified: I38

## 2016-08-04 HISTORY — DX: Unspecified osteoarthritis, unspecified site: M19.90

## 2016-08-04 LAB — URINALYSIS, MICROSCOPIC
RBCS: 0 /HPF (ref <6.0)
WBCS: <1 /HPF (ref <11.0)

## 2016-08-04 LAB — COMPREHENSIVE METABOLIC PANEL, NON-FASTING
ALBUMIN: 4.2 g/dL (ref 3.4–4.8)
ALKALINE PHOSPHATASE: 77 U/L (ref <150)
ALT (SGPT): 14 U/L (ref <55)
ANION GAP: 8 mmol/L (ref 4–13)
AST (SGOT): 18 U/L (ref 8–41)
BILIRUBIN TOTAL: 0.3 mg/dL (ref 0.3–1.3)
BUN/CREA RATIO: 21 (ref 6–22)
BUN: 15 mg/dL (ref 8–25)
CALCIUM: 9.3 mg/dL (ref 8.5–10.2)
CHLORIDE: 99 mmol/L (ref 96–111)
CO2 TOTAL: 28 mmol/L (ref 22–32)
CREATININE: 0.72 mg/dL (ref 0.49–1.10)
ESTIMATED GFR: >59 mL/min/1.73mˆ2 (ref >59)
GLUCOSE: 89 mg/dL (ref 65–139)
POTASSIUM: 4 mmol/L (ref 3.5–5.1)
PROTEIN TOTAL: 8 g/dL (ref 6.0–8.0)
SODIUM: 135 mmol/L — ABNORMAL LOW (ref 136–145)

## 2016-08-04 LAB — URINALYSIS, MACROSCOPIC
BILIRUBIN: NEGATIVE mg/dL
BLOOD: NEGATIVE mg/dL
COLOR: NORMAL
GLUCOSE: NEGATIVE mg/dL
KETONES: NEGATIVE mg/dL
LEUKOCYTES: NEGATIVE WBCs/uL
NITRITE: NEGATIVE
PH: 5 (ref 5.0–8.0)
PROTEIN: NEGATIVE mg/dL
SPECIFIC GRAVITY: 1.012 (ref 1.005–1.030)
UROBILINOGEN: NEGATIVE mg/dL

## 2016-08-04 LAB — TYPE AND SCREEN
ABO/RH(D): O POS
ANTIBODY SCREEN: NEGATIVE

## 2016-08-04 LAB — CBC WITH DIFF
BASOPHIL #: 0.02 x10ˆ3/uL (ref 0.00–0.20)
BASOPHIL %: 0 %
EOSINOPHIL #: 0.04 x10ˆ3/uL (ref 0.00–0.50)
EOSINOPHIL %: 1 %
HCT: 40.2 % (ref 33.5–45.2)
HGB: 13.6 g/dL (ref 11.2–15.2)
LYMPHOCYTE #: 1.92 x10ˆ3/uL (ref 1.00–4.80)
LYMPHOCYTE %: 28 %
MCH: 30.4 pg (ref 27.4–33.0)
MCHC: 34 g/dL (ref 32.5–35.8)
MCV: 89.6 fL (ref 78.0–100.0)
MONOCYTE #: 0.4 x10ˆ3/uL (ref 0.30–1.00)
MONOCYTE %: 6 %
MPV: 7.1 fL — ABNORMAL LOW (ref 7.5–11.5)
NEUTROPHIL #: 4.61 x10ˆ3/uL (ref 1.50–7.70)
NEUTROPHIL %: 66 %
PLATELETS: 254 x10ˆ3/uL (ref 140–450)
RBC: 4.48 x10ˆ6/uL (ref 3.63–4.92)
RDW: 12.3 % (ref 12.0–15.0)
WBC: 7 x10ˆ3/uL (ref 3.5–11.0)

## 2016-08-04 NOTE — Anesthesia Preprocedure Evaluation (Addendum)
ANESTHESIA PRE-OP EVALUATION  Review of Systems    PONV               Pulmonary     Cardiovascular    Valvular problems/murmurs, AS and murmur       GI/Hepatic/Renal       Endo/Other    osteoarthritis     Neuro/Psych/MS    Cancer                              Physical Assessment      Airway       Mallampati: II    TM distance: >3 FB    Neck ROM: full  Mouth Opening: good.            Dental                 Comment: Caps and crowns on back teeth       Pulmonary    Breath sounds clear to auscultation  (-) no rhonchi, no decreased breath sounds, no wheezes, no rales and no stridor     Cardiovascular    Rhythm: regular  Rate: Normal  (+) murmur present        Other findings            Plan  Planned anesthesia type: spinal    ASA 3       Anesthetic plan and risks discussed with patient.                                     EKG: Within last six months   03/26/2016, in [facility]  Sinus rhythm  Normal ECG        ECHO 02/19/2016, in [facility]  Summary    Left ventricle: The systolic function is normal with an ejection fraction range of 60% - 65%. No regional wall motion abnormality identified. LV diastolic function is abnormal and dysfunction is moderate. Abnormal relaxation with increased filling pressures. Doppler parameters are consistent with high ventricular filling pressure.   Aortic valve: The AV motion is restricted.The aortic valve stenosis is moderate-to-severe and regurgitation is mild.Reduced AV cusp separation.         CATH 03/26/2016, in [facility]  IMPRESSION:   1.  Mild, nonobstructive coronary artery disease.   2.  Moderate-to-severe aortic stenosis by valve gradients.   3.  Normal pulmonary artery pressure.        CXR: In last year   06/17/2016, in [facility]  Impression: Negative chest examination.         LABS Ordered 08/04/2016        Consults:   Medical clearance in [facility] 06/17/2016  PLAN:  Lab results reviewed.  She has met with cardiology and was cleared for hip surgery before cardiac surgery but will need  surgery for both.   She is medically stable and "cleared" for surgery.    Cardiology clearance in [facility] per Dr Lacinda Axon 05/14/2016  Dr. Lacinda Axon discussed the risks and benefits of surgery, especially in relation to her hip surgery. Dr. Lacinda Axon felt that her valve does need an operation at some point, but because she is asymptomatic without cardiomyopathic changes, it could be done before or after her hip replacement.  Ms. Donalson expressed understanding of this, and wishes to proceed with hip replacement first. From a cardiac standpoint, she is cleared to have her  hip surgery.        Patient instructed to take Claritin the day of surgery. Hold naproxen 3 days prior per service. Hold vitamins, supplements, and herbals 1 week prior to surgery.        Patient provided with anesthesia consent in Pre-Admission Testing. Instructed patient to review consent prior to OR date. Educated patient that consent will be signed on the morning of surgery with anesthesiologist.

## 2016-08-04 NOTE — H&P (Signed)
[facility]   DEPARTMENT OF ORTHOPAEDICS  HISTORY AND PHYSICAL    CHIEF COMPLAINT  Pre-Operative History and Physical Examination for left total hip arthroplasty scheduled on 08/13/16.    HISTORY OF PRESENT ILLNESS  [name] is a 60 y.o. female who returns to the clinic today for her Pre-Operative History and Physical Examination. The patient is scheduled for a left total hip arthroplasty on 08/13/16. The patient has a history of left hip pain for several years. She has tried multiple things to help with the pain including ibuprofen, steroids, chiropractic adjustment, home exercises. 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  She describes the pain as constant, stabbing and aching in nature.  She describes it at its worst as a 9/10.  She says walking makes the pain worse and lying down or sitting makes the pain better. She feels like her left leg is shorter than her right. After discussion with the patient, we decided that surgery is the best option at this time. Today, the patient denies recent fevers/chills, fatigue, weight loss, N/V, URI or UTI and she deneis any open wounds on the body. Negative personal history of blood clots. Negative family history of blood clots. Negative history of latex or metal allergy. Negative history of anesthesia complications. Negative history of sleep apnea.     MEDICATIONS  Current Outpatient Prescriptions   Medication Sig    cholecalciferol, vitamin D3, 1,000 unit Oral Tablet Take 1,000 Units by mouth Once a day    cyanocobalamin (VITAMIN B-12) 100 mcg Oral Tablet Take 100 mcg by mouth Once a day    multivitamin Oral Tablet Take 1 Tab by mouth Once a day    naproxen (NAPROSYN) 500 mg Oral Tablet TAKE 1 TABLET BY MOUTH TWICE DAILY WITH FOOD       ALLERGIES  Allergies   Allergen Reactions    Iv Contrast Anaphylaxis    Oregano     Stadol [Butorphanol Tartrate] Mental Status Effect    Tomato  Hives/ Urticaria       PAST MEDICAL HISTORY  Past Medical History:   Diagnosis Date    Aortic stenosis     High blood pressure            PAST SURGICAL HISTORY  Past Surgical History:   Procedure Laterality Date    HX CESAREAN SECTION  1984    HX HERNIA REPAIR  2004, 2006, 2008    x3    HX HYSTERECTOMY  2003           SOCIAL HISTORY  Social History   Substance Use Topics    Smoking status: Former Smoker     Packs/day: 1.00     Years: 35.00    Smokeless tobacco: Never Used    Alcohol use Yes      Comment: social only       FAMILY HISTORY  Family Medical History     Problem Relation (Age of Onset)    Cancer Brother    Heart Disease Mother    Hypertension Father    No Known Problems Sister, Sister    Sudden Death no cause Mother              REVIEW OF SYSTEMS  General: negative for fevers or night sweats   Cardiovascular: negative for chest pain  Respiratory: negative for shortness of breath  Gastrointestinal: negative for digestive problems  Genitourinary: negative for urinary problems  Musculoskeletal: positive for left hip pain, negative for knee pain     All other systems reviewed and negative    PHYSICAL EXAMINATION   BP (!) 150/83   Pulse 71   Temp 36 C (96.8 F)   Ht 1.654 m (5' 5.12")   Wt 90.6 kg (199 lb 11.8 oz)   BMI 33.12 kg/m2  GEN:   NAD  SKIN:   No lesions present.  NECK:   Full ROM without pain or neurologic changes.  Trachea midline.  PULM:   Normal respiratory effort.    ABD:   Non distended.  MS: LLE is shorter than RLE, patient limps while ambulating. Groin pain with left hip rotation.   NEURO:   Alert and oriented to person, place and time.    PSYCHOSOCIAL:   Pleasant.  Normal affect.    WOUND/INCISION:   No wounds present.      RADIOGRAPHS  Radiographs were taken and reviewed at this visit to show severe bone on bone arthritis with sclerosis and osteophytes present.     ASSESSMENT     ICD-10-CM    1. 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 Preoperative testing Z01.818 Type and Screen (PAU)     CBC/Diff (PAU)     COMPREHENSIVE METABOLIC PANEL, NON-FASTING   3. Pain in extremity, unspecified extremity M79.609 Urinalysis (PAU)       PLAN  Kevia [name] has been cleared by her PCP and cardiologist. Labs are acceptable but will need repeated today. The patient will be given Ancef for antibiotic prophylaxis and ASA for DVT prophylaxis (patient is NOT participating in the PEPPER study). The patient will be given IV tranexamic acid and a multimodal pain injection intraoperatively. I anticipate that she will be admitted to [facility] for 1 midnight and will be discharged to home with assistance from her daughter and son. We will plan to discharge her with tramadol instead of oxycodone per her request.    1.  Past medical, surgical and family history were reviewed at today's visit.  The patient is negative for a history of anesthesia problems, is negative for a personal history of blood clots, is negative for a family history of blood clots and denies any metal or latex allergy.      2.  The patient is scheduled for surgery on 08/13/16.  A left total hip arthroplasty will be performed.  The patient will be discharged on ASA for DVT Prophylaxis (patient is NOT participating in the PEPPER study). The patient will most likely require a walker for ambulation upon hospital discharge.     3.  Medical records were reviewed at today's visit and the patient has received surgical clearance from her PCP and cardiologist.  The risks and benefits of the surgery were discussed in detail with the patient at today's visit including infection, implant loosening, poly wear, fracture, DVT, pulmonary embolus and anesthesia complications.  Surgical consent for a left total hip arthroplasty was obtained and the consent form was signed and witnessed.  It is my belief that this was informed consent and the patient understood all key aspects of the  operation.    4.  Pre-operative orders were placed.  The patient will require pre-operative antibiotics and Ancef was ordered at this time.  The patient was ordered IV tranexamic acid and a multimodal pain injection to be given intraoperatively.  The patient  was also ordered Celebrex and IV Tylenol to be given pre-operatively.  We are going to draw labs today, including type and screen.    5.  The patient was also evaluated by the Pre Admission Testing NP today, who was given a packet containing the patient's medical clearance and consent form prior to her leaving the clinic today.    6.  The patient was instructed to stop any OTC Supplements 14 days prior to surgery, ASA or Plavix 7 days prior to surgery, Coumadin 5 days prior to surgery, NSAIDS 72 hours prior to surgery, and any ACE or ARB 24 hours prior to surgery.      7.  The patient was instructed to call with any concerns prior to surgery.  All the patient's questions were answered at this time and she understood and agreed to this plan.        Katie L Seifried, PA-C 08/04/2016, 11:24     I personally saw and evaluated the patient. See mid-level's note for additional details. My findings/participation are hip OA.  [place]loc/G7.    [name], MD        Cc:   Derek Jack, APRN,FNP-BC  [place] [address]

## 2016-08-04 NOTE — Progress Notes (Signed)
OUTPATIENT CARE MANAGEMENT ASSESSMENT    Care Management Pre-admission assessment -Met with pt during joint replacement Center pre-op class.     Home Set up - Lives with her daughter, son in law, and grandchildren. There are no entry steps into her basement apartment. Has bedroom and bathroom with tub/shower. She would have 13 steps upto main house, handrail x 1.    Equipment - WILL NEED FWW.     Planned Therapy - her plan is to go home following surgery.    RAPT - 10    Planned DVT Prophylaxis - ASA    Planned Medicines - Called [facility], per Amy, celecoxib on formulary, no PA, no QL; ondansetron on formulary, no PA, QL 18/21; Tramadol QL 180/25.    Planned procedure & surgery date - L THA/A 10/26

## 2016-08-04 NOTE — Progress Notes (Signed)
Total Joint History and Physical Teaching  HPI:   [name] is a 60 y.o. female seen in [place] for pre op teaching leading up to L anterior THA with Dr. Sharlene Motts.    Date of Surg: 10.26.17                       Past Medical History:   Diagnosis Date    Aortic stenosis     High blood pressure                             Past Surgical History:   Procedure Laterality Date    HX CESAREAN SECTION  1984    HX HERNIA REPAIR  2004, 2006, 2008    x3    HX HYSTERECTOMY  2003         Social Hx:  reports that she has quit smoking. She has a 35.00 pack-year smoking history. She has never used smokeless tobacco. She reports that she drinks alcohol. She reports that she does not use illicit drugs.                     History   Smoking Status    Former Smoker    Packs/day: 1.00    Years: 35.00   Smokeless Tobacco    Never Used                      Pre operative Assessment:   Home environment:  Lives in the basement of daughter and son in [place].  No steps to enter her living space with single level living with tub/shower.  13 steps to main level of house.      Social situation:  Lives with daughter, son in law, 29 yo and 17 month old    Home Equipment:  none    Current Level of Function: antalgic gait with limited activity    Patient Goals: get on floor with grandkids, return to photography    Pt reviewed Playbook for Total Joint Replacement with patient.   Discussed home set up and adaptations to meet post op needs.      Anticipated D/C Needs:   Equipment: FWW, cane  Recommendation: home with assistance    Therapist :     Judie Bonus, PT 08/04/2016 10:09  Pager #: [phone]

## 2016-08-04 NOTE — Progress Notes (Signed)
Patient attended pre op education class.  They have a playbook with my contact information including my pager number. Education material reviewed with patient.   They were encouraged to bring this information with them in their playbooks to surgery.  They asked and answered questions appropriately. To determine what will be used for DVT prevention after discussion of PEPPER study

## 2016-08-05 LAB — POC BLOOD GLUCOSE (RESULTS): GLUCOSE, POC: 95 mg/dL (ref 70–105)

## 2016-08-11 HISTORY — PX: HX HIP REPLACEMENT: SHX124

## 2016-08-12 ENCOUNTER — Telehealth (HOSPITAL_BASED_OUTPATIENT_CLINIC_OR_DEPARTMENT_OTHER): Payer: Self-pay | Admitting: Social Worker

## 2016-08-12 MED ORDER — TRANEXAMIC ACID 1,000 MG/10 ML (100 MG/ML) INTRAVENOUS SOLUTION
10.0000 mg/kg | Freq: Once | INTRAVENOUS | Status: AC
Start: 2016-08-13 — End: 2016-08-13
  Administered 2016-08-13: 910 mg via INTRAVENOUS
  Filled 2016-08-12: qty 9.1

## 2016-08-12 MED ORDER — CEFAZOLIN 2 GRAM/20 ML IN STERILE WATER INTRAVENOUS SYRINGE
2.0000 g | INJECTION | Freq: Once | INTRAVENOUS | Status: AC
Start: 2016-08-13 — End: 2016-08-13
  Administered 2016-08-13 (×2): 2 g via INTRAVENOUS
  Filled 2016-08-12: qty 20

## 2016-08-12 MED ORDER — SODIUM CHLORIDE 0.9 % INTRAVENOUS SOLUTION
Freq: Once | INTRAVENOUS | Status: AC
Start: 2016-08-13 — End: 2016-08-13
  Filled 2016-08-12: qty 60

## 2016-08-12 MED ORDER — TRANEXAMIC ACID 1,000 MG/10 ML (100 MG/ML) INTRAVENOUS SOLUTION
10.0000 mg/kg | Freq: Once | INTRAVENOUS | Status: AC
Start: 2016-08-13 — End: 2016-08-13
  Administered 2016-08-13: 1000 mg via INTRAVENOUS
  Filled 2016-08-12: qty 9

## 2016-08-12 NOTE — Telephone Encounter (Signed)
MSW received call from patient regarding completion of Advance Directives document prior to tomorrow's surgery.  Patient stated she went to a bank to have notarized, but they require people to bring their own witnesses, and patient did not.  Patient unsure of what time she will arrive for procedure tomorrow (will receive call sometime between 2:00-6:00 today).  If patient gets call before 4:00 today, and is given an arrival time after 8:00, this MSW can notarize document.  If her arrival is scheduled before this MSW's start time, patient will bring document with her and ask to have it notarized at registration.  MSW advised patient to arrive an additional 15-20 early if the latter occurs, patient verbalized understanding and gratitude for assistance.  MSW will continue to follow as needed.    [name] was provided with information about the following community resources:  Regulatory affairs officer

## 2016-08-13 ENCOUNTER — Inpatient Hospital Stay (HOSPITAL_COMMUNITY): Payer: 59 | Admitting: Orthopaedic Surgery

## 2016-08-13 ENCOUNTER — Inpatient Hospital Stay (HOSPITAL_COMMUNITY): Payer: 59 | Admitting: Certified Registered"

## 2016-08-13 ENCOUNTER — Inpatient Hospital Stay
Admission: RE | Admit: 2016-08-13 | Discharge: 2016-08-14 | DRG: 470 | Disposition: A | Discharge status: Home / Self Care | Payer: 59 | Source: Ambulatory Visit | Attending: Orthopaedic Surgery | Admitting: Orthopaedic Surgery

## 2016-08-13 ENCOUNTER — Inpatient Hospital Stay (HOSPITAL_COMMUNITY)
Admission: RE | Admit: 2016-08-13 | Discharge: 2016-08-13 | Disposition: A | Discharge status: Home / Self Care | Payer: 59 | Source: Ambulatory Visit

## 2016-08-13 ENCOUNTER — Encounter (HOSPITAL_COMMUNITY): Payer: Self-pay

## 2016-08-13 ENCOUNTER — Encounter (HOSPITAL_COMMUNITY)
Admission: RE | Disposition: A | Discharge status: Home / Self Care | Payer: Self-pay | Source: Ambulatory Visit | Attending: Orthopaedic Surgery

## 2016-08-13 ENCOUNTER — Inpatient Hospital Stay (HOSPITAL_COMMUNITY): Payer: 59 | Admitting: Family

## 2016-08-13 ENCOUNTER — Inpatient Hospital Stay: Payer: 59

## 2016-08-13 DIAGNOSIS — Z96642 Presence of left artificial hip joint: Secondary | ICD-10-CM

## 2016-08-13 DIAGNOSIS — E871 Hypo-osmolality and hyponatremia: Secondary | ICD-10-CM | POA: Diagnosis not present

## 2016-08-13 DIAGNOSIS — M1612 Unilateral primary osteoarthritis, left hip: Principal | ICD-10-CM | POA: Diagnosis present

## 2016-08-13 DIAGNOSIS — I10 Essential (primary) hypertension: Secondary | ICD-10-CM | POA: Diagnosis present

## 2016-08-13 DIAGNOSIS — Z888 Allergy status to other drugs, medicaments and biological substances status: Secondary | ICD-10-CM

## 2016-08-13 DIAGNOSIS — D62 Acute posthemorrhagic anemia: Secondary | ICD-10-CM | POA: Diagnosis not present

## 2016-08-13 DIAGNOSIS — Z9889 Other specified postprocedural states: Secondary | ICD-10-CM

## 2016-08-13 DIAGNOSIS — G8929 Other chronic pain: Secondary | ICD-10-CM | POA: Diagnosis present

## 2016-08-13 DIAGNOSIS — R011 Cardiac murmur, unspecified: Secondary | ICD-10-CM | POA: Diagnosis present

## 2016-08-13 DIAGNOSIS — I350 Nonrheumatic aortic (valve) stenosis: Secondary | ICD-10-CM | POA: Diagnosis present

## 2016-08-13 DIAGNOSIS — R12 Heartburn: Secondary | ICD-10-CM | POA: Diagnosis present

## 2016-08-13 DIAGNOSIS — Z79899 Other long term (current) drug therapy: Secondary | ICD-10-CM

## 2016-08-13 DIAGNOSIS — Z91041 Radiographic dye allergy status: Secondary | ICD-10-CM

## 2016-08-13 DIAGNOSIS — Z96649 Presence of unspecified artificial hip joint: Secondary | ICD-10-CM | POA: Diagnosis present

## 2016-08-13 DIAGNOSIS — Z91018 Allergy to other foods: Secondary | ICD-10-CM

## 2016-08-13 LAB — TYPE AND SCREEN
ABO/RH(D): O POS
ANTIBODY SCREEN: NEGATIVE

## 2016-08-13 LAB — POC BLOOD GLUCOSE (RESULTS)
GLUCOSE, POC: 128 mg/dL — ABNORMAL HIGH (ref 70–105)
GLUCOSE, POC: 166 mg/dL — ABNORMAL HIGH (ref 70–105)
GLUCOSE, POC: 143 mg/dL — ABNORMAL HIGH (ref 70–105)

## 2016-08-13 SURGERY — ARTHROPLASTY HIP TOTAL ANTERIOR
Anesthesia: Spinal | Site: Hip | Laterality: Left | Wound class: Clean Wound: Uninfected operative wounds in which no inflammation occurred

## 2016-08-13 MED ORDER — ONDANSETRON HCL (PF) 4 MG/2 ML INJECTION SOLUTION
Freq: Once | INTRAMUSCULAR | Status: DC | PRN
Start: 2016-08-13 — End: 2016-08-13
  Administered 2016-08-13: 4 mg via INTRAVENOUS

## 2016-08-13 MED ORDER — DEXMEDETOMIDINE 4 MCG/ML IV DILUTION
Freq: Once | INTRAMUSCULAR | Status: DC | PRN
Start: 2016-08-13 — End: 2016-08-13
  Administered 2016-08-13: 11:00:00 8 ug via INTRAVENOUS
  Administered 2016-08-13: 11:00:00 12 ug via INTRAVENOUS

## 2016-08-13 MED ORDER — MORPHINE 2 MG/ML INTRAVENOUS CARTRIDGE
2.0000 mg | CARTRIDGE | INTRAVENOUS | Status: DC | PRN
Start: 2016-08-13 — End: 2016-08-14

## 2016-08-13 MED ORDER — SODIUM CHLORIDE 0.9 % (FLUSH) INJECTION SYRINGE
2.0000 mL | INJECTION | Freq: Three times a day (TID) | INTRAMUSCULAR | Status: DC
Start: 2016-08-13 — End: 2016-08-13
  Administered 2016-08-13: 0 mL

## 2016-08-13 MED ORDER — DEXAMETHASONE SODIUM PHOSPHATE 10 MG/ML INJECTION SOLUTION
10.0000 mg | Freq: Once | INTRAMUSCULAR | Status: AC
Start: 2016-08-14 — End: 2016-08-14
  Administered 2016-08-14: 10 mg via INTRAVENOUS
  Filled 2016-08-13: qty 1

## 2016-08-13 MED ORDER — PROPOFOL 10 MG/ML IV BOLUS
INJECTION | Freq: Once | INTRAVENOUS | Status: DC | PRN
Start: 2016-08-13 — End: 2016-08-13
  Administered 2016-08-13: 50 mg via INTRAVENOUS

## 2016-08-13 MED ORDER — NALBUPHINE 10 MG/ML INJECTION SOLUTION
2.5000 mg | INTRAMUSCULAR | Status: DC | PRN
Start: 2016-08-13 — End: 2016-08-14

## 2016-08-13 MED ORDER — SENNOSIDES 8.6 MG-DOCUSATE SODIUM 50 MG TABLET
1.0000 | ORAL_TABLET | Freq: Every evening | ORAL | 0 refills | Status: DC
Start: 2016-08-13 — End: 2016-12-02

## 2016-08-13 MED ORDER — ALCOHOL 62 % (NOZIN NASAL SANITIZER) NASAL SOLUTION
3.0000 | Freq: Once | NASAL | Status: DC
Start: 2016-08-13 — End: 2016-08-13

## 2016-08-13 MED ORDER — BENZOCAINE-MENTHOL SORE THROAT LOZENGE WRAPPER
1.0000 | LOZENGE | Status: DC | PRN
Start: 2016-08-13 — End: 2016-08-14
  Filled 2016-08-13: qty 3

## 2016-08-13 MED ORDER — ALCOHOL 62 % (NOZIN NASAL SANITIZER) NASAL SOLUTION
3.00 | Freq: Once | NASAL | Status: DC
Start: 2016-08-13 — End: 2016-08-13

## 2016-08-13 MED ORDER — BISACODYL 10 MG RECTAL SUPPOSITORY
10.0000 mg | Freq: Every day | RECTAL | Status: DC | PRN
Start: 2016-08-13 — End: 2016-08-14

## 2016-08-13 MED ORDER — MORPHINE 2 MG/ML INTRAVENOUS CARTRIDGE
4.0000 mg | CARTRIDGE | INTRAVENOUS | Status: DC | PRN
Start: 2016-08-13 — End: 2016-08-14

## 2016-08-13 MED ORDER — SODIUM CHLORIDE 0.9 % (FLUSH) INJECTION SYRINGE
2.0000 mL | INJECTION | INTRAMUSCULAR | Status: DC | PRN
Start: 2016-08-13 — End: 2016-08-13

## 2016-08-13 MED ORDER — WATER FOR IRRIGATION, STERILE SOLUTION
1000.0000 mL | Status: DC | PRN
Start: 2016-08-13 — End: 2016-08-13
  Administered 2016-08-13: 1000 mL

## 2016-08-13 MED ORDER — SUGAMMADEX 100 MG/ML INTRAVENOUS SOLUTION
Freq: Once | INTRAVENOUS | Status: DC | PRN
Start: 2016-08-13 — End: 2016-08-13
  Administered 2016-08-13: 300 mg via INTRAVENOUS

## 2016-08-13 MED ORDER — LIDOCAINE (PF) 100 MG/5 ML (2 %) INTRAVENOUS SYRINGE
INJECTION | Freq: Once | INTRAVENOUS | Status: DC | PRN
Start: 2016-08-13 — End: 2016-08-13
  Administered 2016-08-13: 80 mg via INTRAVENOUS

## 2016-08-13 MED ORDER — CELECOXIB 200 MG CAPSULE
200.0000 mg | ORAL_CAPSULE | Freq: Two times a day (BID) | ORAL | 0 refills | Status: DC
Start: 2016-08-13 — End: 2016-12-02

## 2016-08-13 MED ORDER — ONDANSETRON HCL 4 MG TABLET
4.0000 mg | ORAL_TABLET | Freq: Three times a day (TID) | ORAL | 0 refills | Status: DC | PRN
Start: 2016-08-13 — End: 2016-12-02

## 2016-08-13 MED ORDER — ROCURONIUM 10 MG/ML INTRAVENOUS SOLUTION
Freq: Once | INTRAVENOUS | Status: DC | PRN
Start: 2016-08-13 — End: 2016-08-13
  Administered 2016-08-13: 10 mg via INTRAVENOUS
  Administered 2016-08-13: 50 mg via INTRAVENOUS
  Administered 2016-08-13: 10 mg via INTRAVENOUS

## 2016-08-13 MED ORDER — PHENYLEPHRINE 10 MG IN NS 100 ML INFUSION - FOR ANES
INTRAVENOUS | Status: DC | PRN
Start: 2016-08-13 — End: 2016-08-13
  Administered 2016-08-13: 0 ug/kg/min via INTRAVENOUS
  Administered 2016-08-13: .1 ug/kg/min via INTRAVENOUS
  Administered 2016-08-13: 0.3 ug/kg/min via INTRAVENOUS
  Administered 2016-08-13: 0 ug/kg/min via INTRAVENOUS

## 2016-08-13 MED ORDER — TRAMADOL 50 MG TABLET
50.00 mg | ORAL_TABLET | Freq: Four times a day (QID) | ORAL | 0 refills | Status: DC | PRN
Start: 2016-08-13 — End: 2016-12-02

## 2016-08-13 MED ORDER — ONDANSETRON HCL (PF) 4 MG/2 ML INJECTION SOLUTION
4.0000 mg | Freq: Once | INTRAMUSCULAR | Status: DC
Start: 2016-08-13 — End: 2016-08-13

## 2016-08-13 MED ORDER — CELECOXIB 400 MG CAPSULE
400.0000 mg | ORAL_CAPSULE | Freq: Once | ORAL | Status: AC
Start: 2016-08-13 — End: 2016-08-13
  Administered 2016-08-13: 400 mg via ORAL
  Filled 2016-08-13: qty 1

## 2016-08-13 MED ORDER — APREPITANT 40 MG CAPSULE
40.0000 mg | ORAL_CAPSULE | Freq: Once | ORAL | Status: AC
Start: 2016-08-13 — End: 2016-08-13
  Administered 2016-08-13: 40 mg via ORAL
  Filled 2016-08-13: qty 1

## 2016-08-13 MED ORDER — ASPIRIN 81 MG TABLET,DELAYED RELEASE
81.0000 mg | DELAYED_RELEASE_TABLET | Freq: Two times a day (BID) | ORAL | 0 refills | Status: AC
Start: 2016-08-13 — End: 2016-09-12

## 2016-08-13 MED ORDER — FENTANYL (PF) 50 MCG/ML INJECTION SOLUTION
Freq: Once | INTRAMUSCULAR | Status: DC | PRN
Start: 2016-08-13 — End: 2016-08-13
  Administered 2016-08-13 (×3): 50 ug via INTRAVENOUS
  Administered 2016-08-13: 100 ug via INTRAVENOUS
  Administered 2016-08-13: 50 ug via INTRAVENOUS

## 2016-08-13 MED ORDER — SIMETHICONE 80 MG CHEWABLE TABLET
80.0000 mg | CHEWABLE_TABLET | Freq: Three times a day (TID) | ORAL | Status: DC | PRN
Start: 2016-08-13 — End: 2016-08-14
  Filled 2016-08-13: qty 1

## 2016-08-13 MED ORDER — SODIUM CHLORIDE 0.9 % IRRIGATION SOLUTION
3000.0000 mL | Status: DC | PRN
Start: 2016-08-13 — End: 2016-08-13
  Administered 2016-08-13: 3000 mL

## 2016-08-13 MED ORDER — OXYCODONE 5 MG TABLET
10.0000 mg | ORAL_TABLET | ORAL | Status: DC | PRN
Start: 2016-08-13 — End: 2016-08-14

## 2016-08-13 MED ORDER — ACETAMINOPHEN 1,000 MG/100 ML (10 MG/ML) INTRAVENOUS SOLUTION
1000.0000 mg | Freq: Once | INTRAVENOUS | Status: AC
Start: 2016-08-13 — End: 2016-08-13
  Administered 2016-08-13: 1000 mg via INTRAVENOUS

## 2016-08-13 MED ORDER — HYDROMORPHONE 2 MG/ML INJECTION SYRINGE
0.2000 mg | INJECTION | INTRAMUSCULAR | Status: DC | PRN
Start: 2016-08-13 — End: 2016-08-13

## 2016-08-13 MED ORDER — DIPHENHYDRAMINE 50 MG/ML INJECTION SOLUTION
25.0000 mg | Freq: Four times a day (QID) | INTRAMUSCULAR | Status: DC | PRN
Start: 2016-08-13 — End: 2016-08-14

## 2016-08-13 MED ORDER — GLYCOPYRROLATE 0.2 MG/ML INJECTION SOLUTION
Freq: Once | INTRAMUSCULAR | Status: DC | PRN
Start: 2016-08-13 — End: 2016-08-13
  Administered 2016-08-13: 0.2 mg via INTRAVENOUS

## 2016-08-13 MED ORDER — MIDAZOLAM 1 MG/ML INJECTION SOLUTION
Freq: Once | INTRAMUSCULAR | Status: DC | PRN
Start: 2016-08-13 — End: 2016-08-13
  Administered 2016-08-13 (×2): 1 mg via INTRAVENOUS
  Administered 2016-08-13: 2 mg via INTRAVENOUS
  Administered 2016-08-13 (×2): 1 mg via INTRAVENOUS

## 2016-08-13 MED ORDER — MAGNESIUM HYDROXIDE 400 MG/5 ML ORAL SUSPENSION
30.0000 mL | Freq: Two times a day (BID) | ORAL | Status: DC | PRN
Start: 2016-08-13 — End: 2016-08-14

## 2016-08-13 MED ORDER — DEXAMETHASONE SODIUM PHOSPHATE 10 MG/ML INJECTION SOLUTION
10.0000 mg | Freq: Once | INTRAMUSCULAR | Status: DC
Start: 2016-08-13 — End: 2016-08-13

## 2016-08-13 MED ORDER — ETHYL ALCOHOL 62 % (NOZIN NASAL SANITIZER) NASAL SOLUTION - BULK BOTTLE
1.0000 | Freq: Three times a day (TID) | NASAL | Status: DC
Start: 2016-08-13 — End: 2016-08-14
  Administered 2016-08-13 – 2016-08-14 (×3): 1 via NASAL

## 2016-08-13 MED ORDER — CEFAZOLIN 2 GRAM/20 ML IN STERILE WATER INTRAVENOUS SYRINGE
2.0000 g | INJECTION | Freq: Three times a day (TID) | INTRAVENOUS | Status: AC
Start: 2016-08-13 — End: 2016-08-14
  Administered 2016-08-13 – 2016-08-14 (×2): 2 g via INTRAVENOUS
  Filled 2016-08-13 (×2): qty 20

## 2016-08-13 MED ORDER — SODIUM CHLORIDE 0.9 % (FLUSH) INJECTION SYRINGE
2.00 mL | INJECTION | INTRAMUSCULAR | Status: DC | PRN
Start: 2016-08-13 — End: 2016-08-14

## 2016-08-13 MED ORDER — ACETAMINOPHEN 325 MG TABLET
650.0000 mg | ORAL_TABLET | ORAL | Status: DC
Start: 2016-08-14 — End: 2016-08-14
  Administered 2016-08-14: 650 mg via ORAL
  Administered 2016-08-14: 0 mg via ORAL
  Administered 2016-08-14: 650 mg via ORAL
  Filled 2016-08-13 (×3): qty 2

## 2016-08-13 MED ORDER — ACETAMINOPHEN 1,000 MG/100 ML (10 MG/ML) INTRAVENOUS SOLUTION
1000.0000 mg | Freq: Once | INTRAVENOUS | Status: AC
Start: 2016-08-13 — End: 2016-08-13
  Administered 2016-08-13: 1000 mg via INTRAVENOUS
  Filled 2016-08-13: qty 100

## 2016-08-13 MED ORDER — SODIUM CHLORIDE 0.9 % (FLUSH) INJECTION SYRINGE
2.00 mL | INJECTION | Freq: Three times a day (TID) | INTRAMUSCULAR | Status: DC
Start: 2016-08-13 — End: 2016-08-14
  Administered 2016-08-13 (×2): 0 mL
  Administered 2016-08-14: 2 mL

## 2016-08-13 MED ORDER — NALOXONE 0.4 MG/ML INJECTION SOLUTION
0.4000 mg | Freq: Once | INTRAMUSCULAR | Status: DC | PRN
Start: 2016-08-13 — End: 2016-08-14

## 2016-08-13 MED ORDER — ALCOHOL 62 % (NOZIN NASAL SANITIZER) NASAL SOLUTION
1.00 | Freq: Three times a day (TID) | NASAL | Status: AC
Start: 2016-08-13 — End: 2016-08-27

## 2016-08-13 MED ORDER — SODIUM CHLORIDE 0.9 % IRRIGATION SOLUTION
1000.0000 mL | Status: DC | PRN
Start: 2016-08-13 — End: 2016-08-13
  Administered 2016-08-13: 1000 mL

## 2016-08-13 MED ORDER — ONDANSETRON HCL (PF) 4 MG/2 ML INJECTION SOLUTION
4.00 mg | Freq: Three times a day (TID) | INTRAMUSCULAR | Status: AC
Start: 2016-08-13 — End: 2016-08-14
  Administered 2016-08-13: 4 mg via INTRAVENOUS
  Administered 2016-08-13: 0 mg via INTRAVENOUS
  Filled 2016-08-13: qty 2

## 2016-08-13 MED ORDER — HYDROMORPHONE 2 MG/ML INJECTION SYRINGE
INJECTION | Freq: Once | INTRAMUSCULAR | Status: DC | PRN
Start: 2016-08-13 — End: 2016-08-13
  Administered 2016-08-13 (×3): 0.4 mg via INTRAVENOUS
  Administered 2016-08-13 (×2): .4 mg via INTRAVENOUS

## 2016-08-13 MED ORDER — PROMETHAZINE 25 MG/ML INJECTION SOLUTION
6.25 mg | Freq: Four times a day (QID) | INTRAMUSCULAR | Status: DC | PRN
Start: 2016-08-13 — End: 2016-08-14

## 2016-08-13 MED ORDER — MINERAL OIL ENEMA
133.0000 mL | ENEMA | Freq: Every day | RECTAL | Status: DC | PRN
Start: 2016-08-13 — End: 2016-08-14

## 2016-08-13 MED ORDER — PHENYLEPHRINE 0.5 MG/5 ML (100 MCG/ML)IN 0.9 % SOD.CHLORIDE IV SYRINGE
INJECTION | Freq: Once | INTRAVENOUS | Status: DC | PRN
Start: 2016-08-13 — End: 2016-08-13
  Administered 2016-08-13 (×4): 100 ug via INTRAVENOUS

## 2016-08-13 MED ORDER — SODIUM CHLORIDE 0.9 % INTRAVENOUS SOLUTION
INTRAVENOUS | Status: DC | PRN
Start: 2016-08-13 — End: 2016-08-13

## 2016-08-13 MED ORDER — KETOROLAC 15 MG/ML INJECTION SOLUTION
15.0000 mg | Freq: Four times a day (QID) | INTRAMUSCULAR | Status: DC
Start: 2016-08-13 — End: 2016-08-14
  Administered 2016-08-13 – 2016-08-14 (×4): 15 mg via INTRAVENOUS
  Filled 2016-08-13 (×4): qty 1

## 2016-08-13 MED ORDER — TRAMADOL 50 MG TABLET
100.0000 mg | ORAL_TABLET | Freq: Four times a day (QID) | ORAL | Status: DC
Start: 2016-08-13 — End: 2016-08-14
  Administered 2016-08-13 – 2016-08-14 (×4): 100 mg via ORAL
  Filled 2016-08-13 (×4): qty 2

## 2016-08-13 MED ORDER — DEXAMETHASONE SODIUM PHOSPHATE 4 MG/ML INJECTION SOLUTION
Freq: Once | INTRAMUSCULAR | Status: DC | PRN
Start: 2016-08-13 — End: 2016-08-13
  Administered 2016-08-13: 4 mg via INTRAVENOUS

## 2016-08-13 MED ORDER — SODIUM CHLORIDE 0.9 % INTRAVENOUS SOLUTION
INTRAVENOUS | Status: DC
Start: 2016-08-13 — End: 2016-08-14

## 2016-08-13 MED ORDER — HYDROMORPHONE 2 MG/ML INJECTION SYRINGE
0.4000 mg | INJECTION | INTRAMUSCULAR | Status: DC | PRN
Start: 2016-08-13 — End: 2016-08-13

## 2016-08-13 MED ORDER — SENNOSIDES 8.6 MG-DOCUSATE SODIUM 50 MG TABLET
2.0000 | ORAL_TABLET | Freq: Two times a day (BID) | ORAL | Status: DC
Start: 2016-08-13 — End: 2016-08-14
  Administered 2016-08-13: 0 via ORAL
  Administered 2016-08-13 – 2016-08-14 (×2): 2 via ORAL
  Filled 2016-08-13 (×2): qty 2

## 2016-08-13 MED ORDER — OXYCODONE 5 MG TABLET
5.0000 mg | ORAL_TABLET | ORAL | Status: DC | PRN
Start: 2016-08-13 — End: 2016-08-14

## 2016-08-13 MED ORDER — ASPIRIN 81 MG TABLET,DELAYED RELEASE
81.0000 mg | DELAYED_RELEASE_TABLET | Freq: Two times a day (BID) | ORAL | Status: DC
Start: 2016-08-14 — End: 2016-08-14
  Administered 2016-08-14: 81 mg via ORAL
  Filled 2016-08-13 (×2): qty 1

## 2016-08-13 MED ORDER — LACTATED RINGERS INTRAVENOUS SOLUTION
INTRAVENOUS | Status: DC
Start: 2016-08-13 — End: 2016-08-13

## 2016-08-13 MED ORDER — SODIUM CHLORIDE 0.9 % INTRAVENOUS SOLUTION
6.2500 mg | Freq: Once | INTRAVENOUS | Status: DC | PRN
Start: 2016-08-13 — End: 2016-08-13
  Administered 2016-08-13: 6.25 mg via INTRAVENOUS
  Filled 2016-08-13: qty 0.25

## 2016-08-13 MED ADMIN — ceFAZolin 2 gram/20 mL in sterile water intravenous syringe: INTRAVENOUS | @ 19:00:00

## 2016-08-13 MED ADMIN — propofol 10 mg/mL intravenous emulsion: INTRAVENOUS | @ 09:00:00

## 2016-08-13 MED ADMIN — phenylephrine 0.5 mg/5 mL (100 mcg/mL)in 0.9 % sod.chloride IV syringe: INTRAVENOUS | @ 11:00:00

## 2016-08-13 MED ADMIN — midazolam 1 mg/mL injection solution: INTRAVENOUS | @ 09:00:00

## 2016-08-13 MED ADMIN — sodium chloride 0.9 % (flush) injection syringe: INTRAVENOUS | @ 09:00:00

## 2016-08-13 MED ADMIN — sodium chloride 0.9 % intravenous solution: INTRAVENOUS | @ 11:00:00 | NDC 00338004904

## 2016-08-13 MED ADMIN — oxyCODONE-acetaminophen 5 mg-325 mg tablet: @ 09:00:00

## 2016-08-13 MED ADMIN — metroNIDAZOLE 250 mg tablet: INTRAVENOUS | @ 09:00:00

## 2016-08-13 MED ADMIN — tobramycin-dexamethasone 0.3 %-0.1 % eye ointment: @ 14:00:00 | NDC 00065064835

## 2016-08-13 MED ADMIN — oxyCODONE-acetaminophen 5 mg-325 mg tablet: @ 11:00:00

## 2016-08-13 SURGICAL SUPPLY — 27 items
BIT DRILL 30MM 3.2MM DISP (SURGICAL CUTTING SUPPLIES) ×1 IMPLANT
BIT DRILL 30MM 3.2MM G7 ACET S_YS DISP (CUTTING ELEMENTS) ×1
BLADE SAW 3.543X.709IN 2 CUT SGTL THK.054IN STRL (SURGICAL CUTTING SUPPLIES) ×1 IMPLANT
BLADE SAW 3.543X.709IN 2 CUT S_GTL THK.054IN STRL (CUTTING ELEMENTS) ×1
CONV USE 338635 - PACK SURG CSTM ANT  HIP NONST DISP LF (CUSTOM TRAYS & PACK) ×1 IMPLANT
DEVICE CLSR QUILL 36CM 2 ARM MONOF ABS MONODERM 0 .5 CRC TAPER 36MM (SUTURE/WOUND CLOSURE) ×1 IMPLANT
DRESSING MEPI BORDER 4X4 50/CS 295300 SELF ADH LF STRL 5/BX (WOUND CARE SUPPLY) IMPLANT
DRESSING MEPI BORDER 4X4 50/CS 295300 SELF ADH LF STRL 5/BX (WOUND CARE/ENTEROSTOMAL SUPPLY)
DUPE USE ITEM 319390 - SUTURE TRCLSN PLGC 910 2-0 CT1_TAPERPOINT COAT VICRYL+ 27IN (SUTURE/WOUND CLOSURE) ×2 IMPLANT
HEAD G7 36MM HIP BLX D OPTN FEM STRL ×1 IMPLANT
LINER ACET G7 E NEUT 36MM ARCOMXL HIP ×1 IMPLANT
PACK CUSTOM ANTERIOR HIP (CUSTOM TRAYS & PACK) ×1
PEN SURG MRKNG WRITESITE + RLR LBL SET 3X DARKER FORMULATE GNTN VIOL INK STRL LF  CHLRPRP (MISCELLANEOUS PT CARE ITEMS) ×1 IMPLANT
PEN SURG MRKNG WRITESITE + SKN_RLR LBL SET 3X DARKER (MISCELLANEOUS PT CARE ITEMS) ×1
SCREW BONE G7 6.5MM 30MM LOW PROF HIP ACET DOME STRL ×1 IMPLANT
SEALER ESURG .226IN .137IN AQUAMANTYS 6 30D .236IN SPC BIPOLAR 2 ELECTRODE HMST 5.1IN STRL LF  DISP (CAUTERY SUPPLIES) ×1 IMPLANT
SEALER ESURG .226IN .137IN AQU_AMANTYS 6 TRANSCOLLATION 30D (CAUTERY SUPPLIES) ×1
SHELL ACET 52MM HIP LMT H CLR CD PPS G7 E HMSPHR OFST ×1 IMPLANT
SLEEVE CENTERING G7 -3MM OFST TAPER HIP TI TY 1 BLX D OPTN ×1 IMPLANT
SOL IRRG 0.9% NACL 3L PLASTIC CONTAINR UROMATIC LF (SOLUTIONS) ×1 IMPLANT
SOLUTION IRRG NS 3000CC 2B7127_4/CS (SOLUTIONS) ×1
STEM FEM TAPERLOC 144MM PRFT RDC DIST PPS TI HIP 133D HI OFST TAPER 12MM STRL ×1 IMPLANT
SUTURE 0 .5 CRC TPR MONODERM Q_UILL 36CM 2 ARM MONOF ABS 36MM (SUTURE/WOUND CLOSURE) ×1
SUTURE 2-0 3/8 CRC MONODERM QU_ILL 30CM CLR 2 ARM MONOF PREC (SUTURE/WOUND CLOSURE) ×1
SUTURE PDO 2-0 3/8 CRC MONODERM QUILL SRS 30X30CM 2 ARM MONOF BARB PREC RVRS CUT ABS 24MM (SUTURE/WOUND CLOSURE) ×1 IMPLANT
SUTURE TRCLSN PLGC 910 2-0 CT1_TAPERPOINT COAT VICRYL+ 27IN (SUTURE/WOUND CLOSURE) ×2
THR TAPERLOC STEM CUP LINER HEAD ARCM PPS CERM XLPE SYSTEM H-2A ×1 IMPLANT

## 2016-08-13 NOTE — Discharge Summary (Signed)
DISCHARGE SUMMARY      PATIENT NAMEMarshawn, [name]  MRN:  [id number]  DOB:  [birth year]    ADMISSION DATE:  08/13/2016  DISCHARGE DATE:  08/14/2016      ATTENDING PHYSICIAN: [name], *  SERVICE: Steva Colder  PRIMARY CARE PHYSICIAN: Derek Jack, APRN,FNP-BC     Reason for Admission     Diagnosis        S/P hip replacement [id number]          DISCHARGE DIAGNOSIS:     Principal Problem:  S/p Left THA    Active Hospital Problems    Diagnosis Date Noted    S/P hip replacement 08/13/2016      Resolved Hospital Problems    Diagnosis    No resolved problems to display.     Active Non-Hospital Problems    Diagnosis Date Noted    Essential hypertension 02/12/2016    Arthritis of left hip 02/05/2016      Allergies   Allergen Reactions    Iv Contrast Anaphylaxis    Oregano      Hives      Stadol [Butorphanol Tartrate] Mental Status Effect    Tomato Hives/ Urticaria        DISCHARGE MEDICATIONS:     Current Discharge Medication List      START taking these medications.       Details    alcohol 62% Solution   Commonly known as:  NOZIN NASAL SANITIZER    1 Each, Each Nostril, Q8HRS   Refills:  0       aspirin 81 mg Tablet, Delayed Release (E.C.)   Commonly known as:  ECOTRIN    81 mg, Oral, 2x/day   Qty:  60 Tab   Refills:  0       celecoxib 200 mg Capsule   Commonly known as:  CELEBREX    200 mg, Oral, 2x/day   Qty:  30 Cap   Refills:  0       ondansetron 4 mg Tablet   Commonly known as:  ZOFRAN    4 mg, Oral, Q8H PRN   Qty:  18 Tab   Refills:  0       sennosides-docusate sodium 8.6-50 mg Tablet   Commonly known as:  SENOKOT-S    1 Tab, Oral, QPM   Qty:  45 Tab   Refills:  0       traMADol 50 mg Tablet   Commonly known as:  ULTRAM    50 mg, Oral, Q6H PRN   Qty:  100 Tab   Refills:  0         CONTINUE these medications - NO CHANGES were made during your visit.       Details    loratadine 10 mg Tablet   Commonly known as:  CLARITIN    10 mg, Oral, Daily   Refills:  0       multivitamin Tablet    1  Tab, Oral, Daily   Refills:  0         STOP taking these medications.          naproxen 500 mg Tablet   Commonly known as:  NAPROSYN           Discharge med list refreshed?  YES        DISCHARGE INSTRUCTIONS:  Follow-up Information     Follow up with [facility], [facility] .    Specialty:  Orthopaedics Joint Replacement    Contact information:    [place] [phone]  [phone]    Additional information:    Below are driving directions to the [facility],  located within the [place], [place], [place]. If you need any additional information, please call [phone]) or you may visit our website at [email or web address] *From [address]:*[place] onto [address] ** From [address]: Take [address], [facility]. If traveling [place] on [address], keep right off exit ramp; if traveling [place] on [address], turn left at the end of the exit ramp. Continue on [address] for 0.7 miles and the [place] building will be on your left, across from [place].* Valet parking is available to patients at [facility] clinics for free and tipping is not required.          DISCHARGE INSTRUCTION - NOZIN NASAL SWABBING   Note: Instructions apply to the patient and primary caregiver who will change the dressing daily. Swab each nostril 6 times clockwise and 6 times counterclockwise with a clean swab saturated with Nozin. Do this every 8 hours for 14 days from date of surgery.     DISCHARGE INSTRUCTION - DIET   Please resume a healthy, well-balanced diet upon discharge.   Diet: Z - other (specify in comments) Please resume a healthy, well-balanced diet upon discharge.     DISCHARGE INSTRUCTION - ACTIVITY/ WEIGHT BEARING   Weight Bearing Status: left Lower Extremity    WEIGHT BEARING AS TOLERATED: When you stand or walk, place only as much weight as feels  comfortable on your leg. Let pain be your guide. If you feel pain, place less weight on the leg.    -You May use crutches or a walker until further notice.    - You may switch to the use of a cane.     DISCHARGE INSTRUCTION - DVT (BLOOD CLOTS) PREVENTION   -Swelling of the operative leg is normal after discharge because you will be more active at home. If however you develop new pain in the groin, thigh, or calf and if swelling is unrelieved with elevation, this may be a sign of a DVT (blood clot). Please notify your surgeon immediately if this occurs or go the nearest ER to have an ultrasound of your leg.    -Bruising of the operative leg is normal and sometimes the bruising travels all the way to the ankle, foot and toes.  This will gradually go from purple to green to yellow before it disappears completely.      -Additional DVT prevention will be TED hose (stockings) for 4 weeks. These should be worn for at least 18 hours per day. They may be washed if they become soiled.    -Aspirin is being used to prevent DVT (blood clots).  Please take one aspirin 81 mg every 12 hours (twice daily) for 30 days.     DISCHARGE INSTRUCTION - TUB/ SHOWER BENCH   Consider purchasing a tub/shower bench, usually NOT covered by insurance company.     DISCHARGE INSTRUCTION - INCISION/WOUND CARE   STANDARD INSTRUCTIONS- Change the bandage daily with dry gauze. Once the bandage is clean and dry for two consecutive days you may leave the incision uncovered and may shower and wet the incision. You may NOT submerge the incision until told otherwise by your surgeon (no swimming or tub bathing).  - Do not apply  creams, lotions, salves or medicated ointments to the incision.  Simply apply dry gauze and nothing more.  - Please call me or your orthopedic nurse clinician for ANY DRAINAGE that occurs more than 7 days after surgery   Instructions for incision/wound care: Wound Care as Instructed      DISCHARGE INSTRUCTION - EXERCISE AND  RESTRICTIONS   - No driving until further notice. The decision to commence driving will be decided at your first postoperative visit.  - Resume your usual home medications when you get home unless otherwise indicated at the time of discharge.  - Smoking raises one's risk of post-operative infection, wound healing problems, and DVT (blood clots). If you are a smoker and cannot quit permanently, it is best that you wait at least four weeks from surgery before you resume smoking.  HIP REPLACEMENT:  -Observe hip dislocation precautions as instructed for the first 6 weeks.  Please refer to the physical therapist's instructions and therapy diagrams in your playbook or on our website at : [email or web address]   - -Direct Anterior hip precautions as follows:  1. Do not forcibly externally rotate your foot when standing, or lying flat in bed.  2. When turning, your feet and belly button should be facing the same direction.             3. Do not forcibly extend operative hip.    - Physical therapy is rarely necessary after total hip replacement.  The only exercise we recommend is walking, and gluteal, quadriceps and calf contractions.  These do not require the supervision by a physical therapist.  - No weightlifting (resistive exercise) with the operative leg for the first 6 weeks.  - No straight leg raising exercise after total hip replacement for 6 weeks as it causes groin pain.     DISCHARGE INSTRUCTION - FALL PREVENTION   A fall after surgery can be devastating.  Falls can result in dislocation, rupturing a ligament or tendon, or breaking a bone around the new prosthesis. These injuries can be extremely difficult to repair and often require a revision of the entire prosthesis.  Revision operations are associated with a higher complication rate.  Therefore it is crucial that you avoid falling after surgery.  Many falls are caused by poor lighting, a throw rug, a small pet, or too much haste.  Please take time to remove  obstacles, secure pets somewhere safe, and improve lighting at home with the use of properly placed nightlights.     DISCHARGE INSTRUCTION - SIGNS/SYMPTOMS OF POST OPERATIVE INFECTION   -Increasing pain, redness, warmth, shaking chills, sweats or fever may be a sign of a post-operative infection.  -A fever is not uncommon the first three days after surgery. A fever of 100.5 or higher more than 72 hours after surgery is a concern and you should notify us if this occurs.  - Please inspect the incision every day. A little spotting on the dressing is normal. Drainage from the incision that persists more than 7 days after surgery may be a sign of infection and requires my immediate attention.     - If you have questions or concerns please call your orthopedic nurse clinician at [phone] or call [phone] and ask to speak to me (or the resident on call after hours or on weekends).  -No one should place you on an antibiotic for signs of a surgical site infection without first consulting the surgeon.   - No dental procedures for 6 weeks as this  can cause infection in your new joint replacement  - You must receive antibiotics immediately prior to dental visits and procedures like colonoscopy and cystoscopy. This is a life-long recommendation.     DISCHARGE INSTRUCTION - POST-OPERATIVE PAIN   Ice may be applied 20 minutes at a time every 2 hours as needed.  Be careful not to apply ice to bare skin as ice may cause burns just like heat.  - PRESCRIPTION NARCOTIC PAIN RELIEVERS ARE HABIT FORMING.  THEY SHOULD BE RESERVED FOR MODERATE TO SEVERE PAIN.  - Prescription pain relievers are constipating for most people.  Be prepared with over the counter remedies like Milk of Magnesia, Prune Juice, Hot Tea, etc.  Fleets enemas and suppositories are available over the counter as well.     PLEASE KEEP FOLLOW-UP APPT ALREADY SCHEDULED IN [facility]   Please keep your appointment with your follow-up provider. If for some reason  you need to cancel, be sure to reschedule as this is important for your care.   Department [facility] [id number]      DME - WALKER Front Wheeled   Front Wheeled Walker   Ht 165.1 cm    Wt 89.3 kg    Current Attending: Sharlene Motts    Medical Condition or Diagnosis which is primary reason for equipment: s/p Left total hip arthroplasty    Patient has mobility limits that significantly impairs ability to participate in one or more mobility related ADL's (MRADL's): Yes    Moblity Limitations: Pt at heightened risk of injury r/t attempts to fulfill MRADL's & can safely use walker which resolves issue    Walker Type: Front Wheeled    Estimated Length of Need (in months; 99 mo.= lifetime) 99             Opal:  This is a 60 y.o., female who underwent a Left THA with Dr. Sharlene Motts on 08/13/16.    Post-operative Summary:  Course: Unremarkable post-op course  Antibiotics: x24 hours post-op  Foley: None  Drain: None  Dressing: left in place  Wound: c/d/i, no signs of infection  PT/OT: pt appropriate for d/c to Home  Additional Consults: None    CONDITION ON DISCHARGE:   General condition: pain controlled, tolerating PO intake, passing gas  Follow-up: Pt will f/u in 2 wks with Dr. Sharlene Motts  Please review attached discharge instructions and call with any questions or concerns.      CONDITION ON DISCHARGE:  A. Ambulation: Ambulation with assistive device  B. Self-care Ability: Complete  C. Cognitive Status Alert and Oriented x 3  D. DNR status at discharge: Full Code    Advance Directive Information       Most Recent Value    Does the Patient have an Advance Directive? Yes, Patient Does Have Advance Directive for Healthcare Treatment    Type of Advance Directive Completed Medical Living Will, Medical Power of Attorney    Copy of Advance Directives in Chart? Yes, Copy on Chart.(Specify in [place] Directive)    Name of MPOA or Healthcare Surrogate -- Amiira Lier  [phone]    Phone Number of MPOA or Healthcare Surrogate -- Maxine Stellwagen [phone]          DISCHARGE DISPOSITION:  Home discharge        Bud Face, PA-C        Copies sent to Care Team       Relationship Specialty Notifications Start End  Derek Jack, APRN,FNP-BC PCP - General Family Practice-BA  01/08/16     Phone: [phone] Fax: [phone]         [address] [place] [address]    Joaquin Bend, MD Cardiology Fellow CARDIOLOGY  05/01/16     Phone: [phone] Fax: [phone]         [place] [facility] [address]          Referring providers can utilize [email or web address] to access their referred [employer] patient's information.

## 2016-08-13 NOTE — OR PostOp (Signed)
Daughter  phoned in waiting room with update on condition and plan of care.

## 2016-08-13 NOTE — H&P (Signed)
[facility]                                                     H&P Update Form    [name],[name], 60 y.o. female  Date of Admission:  08/13/2016  Date of Birth:  1956/01/04    08/13/2016    STOP: IF H&P IS GREATER THAN 30 DAYS FROM SURGICAL DAY COMPLETE NEW H&P IS REQUIRED.     H & P updated the day of the procedure.  1.  H&P completed within 30 days of surgical procedure by Dr. Sharlene Motts on 08/04/16  and has been reviewed within 24 hours of the surgery, the patient has been examined, and no change has occured in the patients condition since the H&P was completed.       Change in medications: No      Last Menstrual Period: Post-Menopausal      Comments:     2.  Patient continues to be appropiate candidate for planned surgical procedure. YES      [name] Hey, MD

## 2016-08-13 NOTE — Care Plan (Signed)
[place]  Physical Therapy Initial Evaluation    Patient Name: [name]  Date of Birth: 09-Sep-1956  Height: Height: 165.1 cm (5\' 5" )  Weight: Weight: 89.3 kg (196 lb 13.9 oz)  Room/Bed: 7NE/17  Payor: [employer] / Plan: [employer] OF [place] / Product Type: Managed Care /     Assessment:      Pt presents to eval with good tolerance, c/o right anterior thigh numbness and minimal hip pain. Pt required SBA/CGA for all functional mobility, including greater than household distance ambulation with use of FWW. Pt is safe for D/C to home with assist as needed when medically appropriate - will require a FWW for use at home.    Discharge Needs:    Equipment Recommendation: front wheeled walker      The patient presents with mobility limitations due to impaired balance, impaired strength and impaired functional activity tolerance that significantly impair/prevent patients ability to participate in mobility-related activities of daily living (MRADLs) including  ambulation and transfers in order to safely complete, toileting, bathing, safely entering/exiting the home, in reasonable time. This functional mobility deficit can be sufficiently resolved with the use of a front wheeled walker  in order to decrease the risk of falls, morbidity, and mortality in performance of these MRADLs.  Patient is able to safely use this assistive device.    Discharge Disposition: home with assist    JUSTIFICATION OF DISCHARGE RECOMMENDATION   Based on current diagnosis, functional performance prior to admission, and current functional performance, this patient requires continued PT services in home with assist in order to achieve significant functional improvements in these deficit areas: aerobic capacity/endurance, gait, locomotion, and balance, muscle performance.        Plan:   Current Intervention: balance training, bed mobility training, gait training, patient/family education, strengthening,  transfer training  To provide physical therapy services 2x/day, minimum of 5x/week  for duration of until discharge.    The risks/benefits of therapy have been discussed with the patient/caregiver and he/she is in agreement with the established plan of care.       Subjective & Objective      08/13/16 1525   Therapist Pager   PT Assigned/ Pager # [phone]   Rehab Session   Document Type evaluation   Total PT Minutes: 28   Patient Effort excellent   Symptoms Noted During/After Treatment none   General Information   Patient Profile Reviewed? yes   Onset of Illness/Injury or Date of Surgery 08/13/16   Patient/Family Observations Pt supine in bed with HOB elevated upon arrival, daughter present at bedside.   Pertinent History of Current Functional Problem Pt is a 60 yo female POD 0 s/p L THA - LLE WBAT, anterior hip precautions.   Medical Lines PIV Line   Respiratory Status room air   Existing Precautions/Restrictions full code;anterior hip precaution   Weight-bearing Status   Extremity Weight-bearing Status left lower extremity   Left Lower Extremity weight-bearing as tolerated (WBAT)   Mutuality/Individual Preferences   Patient Specific Goals (Include Timeframe) go home   Patient Specific Interventions ambulate as tolerated with SBA and FWW   Mutuality Comment Pt very pleasant and agreeable to PT eval, RN clearance for session obtained.   Living Environment   Lives With child(ren), adult   [place] Pt reports that she lives with her daughter and her daughter's family in a 3 level house with a 1st floor setup and  no STE. Pt has a tub shower combo and owns a tub transfer bench.   Functional Level Prior   Ambulation 0 - independent   Transferring 0 - independent   Toileting 0 - independent   Bathing 0 - independent   Dressing 0 - independent   Eating 0 - independent   Communication 0 - understands/communicates without difficulty   Prior Functional Level Comment completely (I)  prior to admission   Pre Treatment Status   Pre Treatment Patient Status Patient supine in bed;Call light within reach;Telephone within reach   Support Present Pre Treatment  Family present  (daughter)   Cognitive Assessment/Interventions   Behavior/Mood Observations behavior appropriate to situation, WNL/WFL   Orientation Status  oriented x 4   Attention WNL/WFL   Follows Commands  WNL   Pain Assessment   Pain Scale: Numbers, Pretreatment 0/10 - no pain   Pain Scale: Numbers, Post-Treatment 3/10   Pain Location - Side Left   Pain Location hip   RUE Assessment   RUE Assessment WFL- Within Functional Limits   LUE Assessment   LUE Assessment WFL- Within Functional Limits   RLE Assessment   RLE Assessment WFL- Within Functional Limits   LLE Assessment   LLE Assessment X-Exceptions   LLE ROM knee and ankle WFL, able to initiate hip flexion against gravity   LLE Strength no formal MMT but knee and ankle at least 3+/5 and hip flexors 2+/5   LLE Other pt c/o numbness in anterior thigh   Bed Mobility Assessment/Treatment   Bed Mobility, Assistive Device Head of Bed Elevated   Supine-Sit Independence supervision required   Sit to Supine, Independence supervision required   Comment  no difficulty noted, maintain anterior hip precautions t/o   Transfer Assessment/Treatment   Sit-Stand Independence  contact guard assist   Stand-Sit Independence  contact guard assist   Sit-Stand-Sit, Assist Device rolling walker   Toilet Transfer Independence stand-by assistance   Toilet Transfer Assist Device (FWW and grab bar)   Transfer Impairments balance impaired   Transfer Comment CGA for safety but steady upons tanding - verbal cues initially for BUE placementa nd efficient push off with good carryover   Gait Assessment/Treatment   Independence  stand-by assistance   Assistive Device  rolling walker   Distance in Feet 100   Gait Speed slow/moderate   Impairments  balance impaired   Comment SBA for safety due to pt c/o R anterior thigh  numbness but no LOB or knee buckling noted t/o. Pt able to utilize step through gait pattern with initial cues for proper use of FWW, good carryover.   Balance Skill Training   Comment with use of FWW   Sitting Balance: Static good balance   Sitting, Dynamic (Balance) fair + balance   Sit-to-Stand Balance fair balance   Standing Balance: Static fair + balance   Standing Balance: Dynamic fair balance   Systems Impairment Contributing to Balance Disturbance musculoskeletal   Post Treatment Status   Post Treatment Patient Status Patient sitting in bedside chair or w/c;Call light within reach;Telephone within reach   Support Present Post Treatment  Family present  (daughter)   Plan of Care Review   Plan Of Care Reviewed With patient;daughter   Physical Therapy Clinical Impression   Assessment Pt presents to eval with good tolerance, c/o right anterior thigh numbness and minimal hip pain. Pt required SBA/CGA for all functional mobility, including greater than household distance ambulation with use of FWW. Pt is safe for  D/C to home with assist as needed when medically appropriate - will require a FWW for use at home.   Patient/Family Goals Statement  go home   Criteria for Skilled Therapeutic yes;treatment indicated   Pathology/Pathophysiology Noted musculoskeletal   Impairments Found (describe specific impairments) aerobic capacity/endurance;gait, locomotion, and balance;muscle performance   Functional Limitations in Following  self-care;home management;community/leisure   Disability: Inability to Perform community/leisure   Rehab Potential good, to achieve stated therapy goals   Therapy Frequency 2x/day;minimum of 5x/week   Predicted Duration of Therapy Intervention (days/wks) until discharge   Anticipated Equipment Needs at Discharge (PT Clinical Impression) front wheeled walker   Anticipated Discharge Disposition  home with assist   Evaluation Complexity Justification   Patient History: Co-morbity/factors that Impact  Plan of Care 1-2 that impact Plan of Care;Surgical procedure: causing pain &/or impaired function   Examination Components 4 or more Exam elements addressed;Range of motion;Strength;Balance;Bed mobility;Transfers;Ambulation   Presentation Stable: Uncomplicated, straight-forward, problem focused   Clinical Decision Making Low complexity   Evaluation Complexity Low complexity   Care Plan Goals   PT Rehab Goals Bed Mobility Goal;Gait Training Goal;Transfer Training Goal;Strength Goal   Bed Mobility Goal   Bed Mobility Goal, Date Established 08/13/16   Bed Mobility Goal, Time to Achieve by discharge   Bed Mobility Goal, Activity Type all bed mobility activities   Bed Mobility Goal, Independence Level independent   Gait Training  Goal, Distance to Achieve   Gait Training  Goal, Date Established 08/13/16   Gait Training  Goal, Time to Achieve by discharge   Gait Training  Goal, Independence Level modified independence   Gait Training  Goal, Assist Device least restricted assistive device   Gait Training  Goal, Distance to Achieve at least 250' with no LOB and no rest break required   Strength Goal   Strength Goal, Date Established 08/13/16   Strength Goal, Time to Achieve by discharge   Strength Goal, Measure to Achieve Pt will increase strength in L hip flexors to at least 3+/5   Transfer Training Goal   Transfer Training Goal, Date Established 08/13/16   Transfer Training Goal, Time to Achieve by discharge   Transfer Training Goal, Activity Type all transfers   Transfer Training Goal, Independence Level modified independence   Transfer Training Goal, Assist Device least restricted assistive device   Planned Therapy Interventions, PT Eval   Planned Therapy Interventions (PT Eval) balance training;bed mobility training;gait training;patient/family education;strengthening;transfer training       Therapist:   Hedda Slade, PT   Pager #: [phone]

## 2016-08-13 NOTE — Nurses Notes (Addendum)
Patient arrived to 7ne 17 post op left total hip replacement. Upon arrival; patient alert and oriented x4. Assessment per flowsheet. Patient's dressing c/d/i. Patient's VSS, except temp is 96.2 F axillary. PA Zomcik notified, patient wrapped in warm blankets and will recheck temp. Patient oriented to room, call bell system, and unit. Patient instructed to only get up with assistance. Call bell within reach. Will monitor.     1433: Patient's temp 36.5 oral. Will monitor.     1532: Patient's temp 96.3 axillary. Patient not cold to touch and reports that she does not feel chills. Unable to get oral temp. Patient placed in warm blankets, PA Zomcik and Dr. Kendell Bane notified, Dr. Kendell Bane stated that "we only have to get temp with normal vitals unless patient becomes symptomatic."Will monitor.

## 2016-08-13 NOTE — Care Management Notes (Signed)
[place] Management Note    Patient Name: [name]  Date of Birth: 02-18-1956  Sex: female  Date/Time of Admission: 08/13/2016  7:39 AM  Room/Bed: 7NE/17  Payor: [employer] / Plan: [employer] OF [place] / Product Type: Managed Care /    LOS: 0 days   PCP: Derek Jack, APRN,FNP-BC    Admitting Diagnosis:  S/P hip replacement [Z96.649]       08/13/16 1511   ADVANCE DIRECTIVES   Does the Patient have an Advance Directive? Yes, Patient Does Have Advance Directive for Healthcare Treatment   Type of Advance Directive Completed Medical Living Will;Medical Power of Attorney   Copy of Advance Directives in Chart? 0   Name of [place] or Healthcare Surrogate Lakhia Gengler [phone])   Phone Number of [place] or Healthcare Surrogate Marvalene Barrett [phone])     MSW met with patient to provide education regarding the purpose and process of completing advance directives.  Pt selected to complete combined MPOA/LW.  Original combined MPOA/LW and copies given to patient . Copy placed in patient chart.      Case Manager: Larence Penning  Phone: [phone]

## 2016-08-13 NOTE — OR Surgeon (Signed)
[place]                                          BRIEF OPERATIVE NOTE    Patient Name: [facility] Number: [id number]  Date of Service: 08/13/2016   Date of Birth: July 26, 1956    All elements must be documented.    Pre-Operative Diagnosis: Left hip osteoarthritis   Post-Operative Diagnosis: Same  Procedure(s)/Description:  Left total hip arthroplasty  Findings/Complexity (inherent to the procedure performed): See detailed op note     Attending Surgeon: Sharlene Motts, MD  Assistant(s): Dagoberto Ligas, MD and Jacquenette Shone, PA-C    Anesthesia Type: General endotracheal anesthesia  Estimated Blood Loss:  250 ml  Blood Given: None  Fluids Given: 2000 ml crystalloid  Complications (not routinely expected or not inherent to difficulty/nature of procedure):None  Characteristic Event (routinely expected or inherent to the difficulty/nature of the procedure): Routine  Did the use of current and/or prior Anticoagulants impact the outcome of the case? no  Wound Class: Clean Wound: Uninfected operative wounds in which no inflammation occurred    Tubes: None  Drains: None  Specimens/ Cultures: See detailed op note  Implants: See detailed op note           Disposition: PACU - hemodynamically stable.  Condition: stable    Wei Poplaski, PA-C  08/13/2016, 11:17

## 2016-08-13 NOTE — Anesthesia Transfer of Care (Signed)
ANESTHESIA TRANSFER OF CARE   [name] is a 60 y.o. ,female, Weight: 89.3 kg (196 lb 13.9 oz)   had Procedure(s):  ARTHROPLASTY HIP TOTAL ANTERIOR  performed  08/13/16   Primary Service: Legrand Como Fr*    Past Medical History:   Diagnosis Date    Aortic stenosis     Arthritis     Chronic pain     Heart murmur     echo 02/2016, aortic stenosis    Heartburn     High blood pressure     Nausea with vomiting     Valvular disease     aortic valve stenosis, following with CT surgery, echo 02/2016 and cath 03/2016      Allergy History as of 08/13/16     BUTORPHANOL TARTRATE       Noted Status Severity Type Reaction    01/08/16 1311 Flossie Buffy, LPN [id number] Active   Mental Status Effect          IV CONTRAST       Noted Status Severity Type Reaction    01/08/16 1312 Flossie Buffy, LPN [id number] Active High  Anaphylaxis          TOMATO       Noted Status Severity Type Reaction    01/08/16 1312 Flossie Buffy, LPN [id number] Active Low  Hives/ Urticaria          OREGANO       Noted Status Severity Type Reaction    08/04/16 1129 Rozzell, Kelly Splinter, RN 01/08/16 Active       Comments:  Hives       01/08/16 1312 Flossie Buffy, LPN [id number] Active                 I completed my transfer of care / handoff to the receiving personnel during which we discussed:  Access, Airway, All key/critical aspects of case discussed, Analgesia, Antibiotics, Expectation of post procedure, Fluids/Product and Gave opportunity for questions and acknowledgement of understanding                                            Additional Info:To PACU, VSS, airway patent on 5L SFM, bedside report given to PACU RN, VAS 4/10                    Last OR Temp: Temperature: 36 C (96.8 F)  ABG:  PCO2 (PCO2P)   Date Value Ref Range Status   03/26/2016 28 (LL) 35 - 45 mmHg Final     PO2 (PO2P)   Date Value Ref Range Status   03/26/2016 125 (H) 72 - 100 mmHg Final     POTASSIUM   Date Value Ref Range Status   08/04/2016 4.0 3.5 - 5.1 mmol/L  Final     KETONES   Date Value Ref Range Status   08/04/2016 Negative Negative mg/dL Final     CALCIUM   Date Value Ref Range Status   08/04/2016 9.3 8.5 - 10.2 mg/dL Final     Calculated P Axis   Date Value Ref Range Status   03/26/2016 47 degrees Final     Calculated R Axis   Date Value Ref Range Status   03/26/2016 51 degrees Final     Calculated T Axis   Date Value Ref Range Status   03/26/2016 52 degrees Final  BASE EXCESS (BEP)   Date Value Ref Range Status   03/26/2016 1.0 -2.0 - 3.0 mmol/L Final     HCO3 (HCO3P)   Date Value Ref Range Status   03/26/2016 24 22 - 26 mmol/L Final     Airway:   Blood pressure 112/82, pulse 96, temperature 36 C (96.8 F), resp. rate (!) 11, height 1.651 m (5\' 5" ), weight 89.3 kg (196 lb 13.9 oz), SpO2 100 %, not currently breastfeeding.

## 2016-08-13 NOTE — Progress Notes (Signed)
[place]  Orthopaedic Joint Service  Progress Note    Date: 08/13/2016  Time: 11:15    SUBJECTIVE: Patient resting in PACU    OBJECTIVE:      Filed Vitals:    08/13/16 0811   BP: (!) 146/87   Pulse: 81   Resp: 19   Temp: 36.5 C (97.7 F)   SpO2: 98%     --------------------------------------------------------------------------------------------------------------  Gen: NAD, breathing nonlabored  Dressing/Splint: c/d/i  Left LE: palpable PT/DP pulses, sensation intact to light touch, able to wiggle toes  ---------------------------------------------------------------------------------------------------------------  CBC Results Differential Results   No results found for this or any previous visit (from the past 30 hour(s)). No results found for this or any previous visit (from the past 30 hour(s)).   BMP Results Other Chemistries Results   No results found for this or any previous visit (from the past 30 hour(s)). No results found for this or any previous visit (from the past 30 hour(s)).   Liver/Pancreas Enzyme Results Liver Function Results   No results found for this or any previous visit (from the past 30 hour(s)). No results found for this or any previous visit (from the past 30 hour(s)).   Cardiac Results Coags Results   No results found for this or any previous visit (from the past 30 hour(s)). No results found for this or any previous visit (from the past 30 hour(s)).       ----------------------------------------------------------------------------------------------------------------  Drains: None  Cultures: None    ASSESSMENT:  60 y.o. female s/p Left total hip arthroplasty      PLAN:  - Weightbearing: WBAT, anterior hip precautions  - PT/OT: ordered; awaiting recs  - DVT prophylaxis: SCDs; Aspirin 81 mg BID starting tomorrow  - Antibiotics: Ancef x 24 hours  - Pain: po, IV  - Dressing: located left hip, ortho to manage  - Labs: AM labs  - Imaging: postop xray  - Diet: Regular  - Encourage OOB with  assistance. Meals in chair.  - Nursing instructions: check for "held/pended" orders upon arrival to floor  - Dispo: d/c to home POD 1, floor when recovery area d/c criteria met  - Follow-up: will see back in Dr. Wandra Scot clinic 11/14. Order placed in [place].      Lucious Groves Zane Pellecchia, PA-C 08/13/2016, 11:15  Orthopaedic Surgery

## 2016-08-13 NOTE — OR Surgeon (Signed)
PATIENT NAME: [place] NUMBER:  [id number]  DATE OF SERVICE: 08/13/2016  DATE OF BIRTH:  June 19, 1956    OPERATIVE REPORT    PREOPERATIVE DIAGNOSIS:  Left hip osteoarthritis.    POSTOPERATIVE DIAGNOSIS:  Left hip osteoarthritis.    NAME OF PROCEDURE:  Left total hip arthroplasty and surgeon interpretation of fluoroscopy.    SURGEON:  [name], MD.    ASSISTANT:  Guss Bunde, MD and Lucious Groves Ricchio, PA-C.    ANESTHESIA:  Failed spinal with general.    ESTIMATED BLOOD LOSS:  250 mL.    FLUIDS:  Two liters of crystalloid.    COMPLICATIONS:  None.    FINAL IMPLANTS:  1. Biomet G7 52 mm acetabular shell with 1 screw and a highly crosslinked polyethylene liner.  2. Biomet taperloc complete reduced distal size 12 high offset stem.  3. Biomet Biolox Delta ceramic 36 mm femoral head with a -3 mm titanium taper.    INDICATIONS FOR PROCEDURE:  Ms. Laity is a 60 year old patient referred to me for severe bilateral hip pain and osteoarthritis that has been getting worse over the past few years despite multiple failed conservative treatments.  She wished for a more permanent solution.  We discussed total hip arthroplasty in detail, including all the associated risks and complications of surgery.  I also discussed purposely giving her a few extra mm of length to prepare for equalization with the second stage.  She understood and wished to proceed with surgery.    DESCRIPTION OF PROCEDURE:  Ms. Choksi was identified in the preop holding area and the appropriate extremity was marked.  She was taken back to the operating room where A lines were placed due to the patient's history of aortic stenosis.  A spinal was then attempted but was unsuccessful, so general anesthesia was induced, supine on a general use table.  All bony prominences were padded.  Two grams of Ancef were infused as preop antibiotics as well as a dose of IV tranexamic acid.  The left hip and bilateral lower extremities were  prepped and draped in the usual sterile fashion.  A surgical pause was performed to identify the patient, operative extremity, procedure and administration of IV antibiotics.  A roughly 8-10 cm longitudinal incision was made over the anterior aspect of the hip 2 cm lateral to the ASIS.  Dissection was performed down to the fascia overlying the tensor fascia lata muscle.  This was split in line with our incision.  The muscle belly was bluntly dissected free and retracted laterally.  The circumflex vessels were identified and coagulated.  Retractors were placed around the femoral neck.  An anterior capsulectomy was performed and the definitive neck cut was made under direct visualization with C-arm confirmation based on our templating.  A napkin ring osteotomy was performed.  The napkin ring and femoral head removed with threaded Steinmann pin.  Acetabular retractors were placed.  Residual labrum was debrided.  I then reamed with a 51 reamer the floor of the acetabulum with good grip and exposed bleeding cancellous bone.  A 52 mm shell was impacted in roughly 40 degrees of abduction and 20 degrees of anteversion.  It seated well with good fixation.  I did place 1 screw because I adjusted the cup during its insertion.  This had good purchase.  I placed a crosslink liner.  I then placed the Omni track on the femur and then placed the bed in extension and Trendelenburg.  Calcar and  greater trochanter retractors were placed.  The superior capsule was released from the inner aspect of the trochanter to mobilize the femur.  A box osteotome was used to open the proximal femur.  A curved canal-finding rasp was used to enter the canal.  I then began broaching with the Taperloc broaches to a size 12 as the best fit and filled the femur with axial and rotational stability.  I trialed a high offset neck and a -6, which I felt this left our clinical and radiographic leg lengths a little short but was very stable with about 10  degrees of hyperextension over 70 degrees of external rotation as well as 90 degrees of hip flexion, 5 degrees adduction over 60 degrees of internal rotation without any pain or dislocation.  Radiographs revealed good size and alignment of the broach with good restoration of offset.  I placed the real stem which sat at the level of our broach and trialed the -3.  I felt this gave [place] good clinical leg lengths with maybe 1 mm or 2 mm too long, so I placed the real -3 head and reduced this in the acetabulum.  I thoroughly irrigated and injected the multimodal pain injection cocktail.  I then closed the fascia with 0 Quill barbed suture.  Subcutaneous tissues were irrigated and closed with 2-0 Vicryl.  The skin was closed with a 2-0 Quill barbed suture and Dermabond.  A sterile dressing was applied.  The drapes were removed and Ms. Muston was awaked from anesthesia, placed on a hospital bed and taken to the recovery room in stable condition.  I was present and performed all key and critical portions of the above procedure.    POSTOPERATIVE PLAN:  Ms. Sinsel will receive 24 hours of postoperative antibiotics.  She will be mobilized later on the day of   surgery with physical therapy, weightbearing as tolerated and likely have a 24-48 hour hospital stay with discharge to home.        [name], MD  Assistant Professor   [facility]                 DD:  08/13/2016 10:49:48  DT:  08/13/2016 15:46:26 LL  D#:  [id number]

## 2016-08-13 NOTE — Anesthesia Postprocedure Evaluation (Signed)
Anesthesia Post Op Evaluation    [name]  Procedure(s) Performed:Procedure(s):  ARTHROPLASTY HIP TOTAL ANTERIOR  Last Vitals:Temperature: 36 C (96.8 F) (08/13/16 1145)  Heart Rate: 84 (08/13/16 1130)  BP (Non-Invasive): 123/85 (08/13/16 1130)  Respiratory Rate: 14 (08/13/16 1130)  SpO2-1: 100 % (08/13/16 1130)  Pain Score (Numeric, Faces): 4 (08/13/16 1200)  Patient is sufficiently recovered from the effects of anesthesia to participate in the evaluation and has returned to their pre-procedure level.  Patient location during evaluation: PACU   Post-procedure handoff checklist completed    Patient participation: complete - patient participated  Level of consciousness: awake and alert and responsive to verbal stimuli  Pain management: adequate  Airway patency: patent  Anesthetic complications: no  Cardiovascular status: acceptable  Respiratory status: acceptable  Hydration status: acceptable  Patient post-procedure temperature: Pt Normothermic   PONV Status: Absent

## 2016-08-13 NOTE — Care Plan (Signed)

## 2016-08-14 LAB — BASIC METABOLIC PANEL
ANION GAP: 3 mmol/L — ABNORMAL LOW (ref 4–13)
BUN/CREA RATIO: 20 (ref 6–22)
BUN: 13 mg/dL (ref 8–25)
CALCIUM: 8.1 mg/dL — ABNORMAL LOW (ref 8.5–10.2)
CHLORIDE: 105 mmol/L (ref 96–111)
CHLORIDE: 105 mmol/L (ref 96–111)
CO2 TOTAL: 26 mmol/L (ref 22–32)
CO2 TOTAL: 26 mmol/L (ref 22–32)
CREATININE: 0.65 mg/dL (ref 0.49–1.10)
ESTIMATED GFR: >59 mL/min/1.73m?2 (ref >59)
ESTIMATED GFR: >59 mL/min/1.73mˆ2 (ref >59)
GLUCOSE: 113 mg/dL (ref 65–139)
GLUCOSE: 113 mg/dL (ref 65–139)
POTASSIUM: 4.4 mmol/L (ref 3.5–5.1)
SODIUM: 134 mmol/L — ABNORMAL LOW (ref 136–145)

## 2016-08-14 LAB — CBC
HCT: 28.6 % — ABNORMAL LOW (ref 33.5–45.2)
HGB: 9.4 g/dL — ABNORMAL LOW (ref 11.2–15.2)
MCH: 29.7 pg (ref 27.4–33.0)
MCHC: 32.9 g/dL (ref 32.5–35.8)
MCV: 90.4 fL (ref 78.0–100.0)
MPV: 7.1 fL — ABNORMAL LOW (ref 7.5–11.5)
PLATELETS: 179 x10ˆ3/uL (ref 140–450)
RBC: 3.17 10*6/uL — ABNORMAL LOW (ref 3.63–4.92)
RBC: 3.17 x10ˆ6/uL — ABNORMAL LOW (ref 3.63–4.92)
RDW: 12.2 % (ref 12.0–15.0)
WBC: 11.4 10*3/uL (ref 3.5–11.0)
WBC: 11.4 x10ˆ3/uL — ABNORMAL HIGH (ref 3.5–11.0)

## 2016-08-14 LAB — POC BLOOD GLUCOSE (RESULTS)
GLUCOSE, POC: 139 mg/dL — ABNORMAL HIGH (ref 70–105)
GLUCOSE, POC: 123 mg/dL — ABNORMAL HIGH (ref 70–105)

## 2016-08-14 MED ADMIN — traMADoL 50 mg tablet: ORAL | @ 09:00:00

## 2016-08-14 MED ADMIN — ceFAZolin 2 gram/20 mL in sterile water intravenous syringe: INTRAVENOUS | @ 02:00:00

## 2016-08-14 MED ADMIN — lactulose 20 gram/30 mL oral solution: ORAL | @ 09:00:00

## 2016-08-14 NOTE — Care Plan (Signed)
[facility]  Physical Therapy Progress Note      Patient Name: [name]  Date of Birth: 20-Apr-1956  Height:  167.6 cm (5\' 6" )  Weight:  92.4 kg (203 lb 11.3 oz)  Room/Bed: 7NE/17  Payor: [employer] / Plan: [employer] OF [place] / Product Type: Managed Care /     Assessment:     Pt presents to treatment session with excellent tolerance, no c/o t/o. Pt required no physical assist for functionla mobility, able to negotiate stairs well (per pt request). Pt remains safe for D/C to home with assist when medically appropriate.    Discharge Needs:   Equipment Recommendation: front wheeled walker    The patient presents with mobility limitations due to impaired balance, impaired range of motion, impaired strength and impaired functional activity tolerance that significantly impair/prevent patients ability to participate in mobility-related activities of daily living (MRADLs) including  ambulation and transfers in order to safely complete, toileting, bathing, safely entering/exiting the home, in reasonable time. This functional mobility deficit can be sufficiently resolved with the use of a front wheeled walker in order to decrease the risk of falls, morbidity, and mortality in performance of these MRADLs.  Patient is able to safely use this assistive device.    Discharge Disposition: home with assist    JUSTIFICATION OF DISCHARGE RECOMMENDATION   Based on current diagnosis, functional performance prior to admission, and current functional performance, this patient requires continued PT services in home with assist in order to achieve significant functional improvements in these deficit areas: aerobic capacity/endurance, gait, locomotion, and balance, muscle performance.      Plan:   Continue to follow patient according to established plan of care.  The risks/benefits of therapy have been discussed with the patient/caregiver and he/she is in agreement with the established plan of care.       Subjective & Objective:        08/14/16 0847   Therapist Pager   PT Assigned/ Pager # Jess [phone]   Rehab Session   Document Type therapy note (daily note)   Total PT Minutes: 26   Patient Effort excellent   Symptoms Noted During/After Treatment none   General Information   Patient Profile Reviewed? yes   Patient/Family Observations Pt standing at sink brushing her teeth with assist from CA upon arrival   Medical Lines PIV Line   Respiratory Status room air   Existing Precautions/Restrictions full code;anterior hip precaution   Weight-bearing Status   Left Lower Extremity weight-bearing as tolerated (WBAT)   Mutuality/Individual Preferences   Patient Specific Interventions ambulate as tolerated with SBA and FWW   Mutuality Comment Pt very pleasant and agreeable to PT treatment, RN clearance for session obtained.   Pre Treatment Status   Pre Treatment Patient Status Patient standing at bedside   Support Present Pre Treatment  Clinical assistant present   Cognitive Assessment/Interventions   Behavior/Mood Observations behavior appropriate to situation, WNL/WFL   Orientation Status  oriented x 4   Attention WNL/WFL   Follows Commands  WNL   Pain Assessment   Pain Scale: Numbers, Pretreatment 0/10 - no pain   Pain Scale: Numbers, Post-Treatment 0/10 - no pain   Pre/Post Treatment Pain Comment c/o mild soreness in left hip   Mobility Assessment/Training   Additional Documentation Stairs Assessment/Treatment (Group)   Bed Mobility Assessment/Treatment   Comment  not assessed   Transfer Assessment/Treatment   Sit-Stand Independence  not tested   Stand-Sit Independence  supervision  required   Sit-Stand-Sit, Assist Device rolling walker   Transfer Impairments balance impaired   Transfer Comment SPV for safety but good use of BUEs to assist with eccentric control   Gait Assessment/Treatment   Independence  modified independence;contact guard assist   Assistive Device  rolling walker  (and s/c)   Distance in Feet 375', 40',  75'   Gait Speed moderate   Impairments  balance impaired   Comment Pt able to ambulate 375' and 64' Mod I with FWW, performed stairs between distances. pt with step through gait pattern and minimal reliance on FWW so trialed s/c. Pt able to ambulate 2' with s/c and CGA for safety - pt with decreased gait speed but no LOB noted. Pt feels much more confortable with FWW at this time.   Stairs Assessment/Treatment   Number of Stairs 4 stairs x2 and curb step x2   Handrail Location both sides   Independence Level stand-by assistance   Assistive Device (FWW)   Technique Used step to step (ascending);step to step (descending)   Impairments balance impaired;ROM decreased   Comment Pt reuqested to trial stairs even though she does not have to perform initially at home. Pt able to negotiate 4 stairs x2 with B HR and SBa for safety, cues initially for sequencing wtih good carryover. Pt then able to negotiate curb step x2 with use of FWW and SBA for safety. Verbal cues and demo for sequencing with FWW with good pt carryover.   Balance Skill Training   Comment with use of FWW   Sitting Balance: Static good balance   Sitting, Dynamic (Balance) fair + balance   Sit-to-Stand Balance fair + balance   Standing Balance: Static fair + balance   Standing Balance: Dynamic fair + balance   Post Treatment Status   Post Treatment Patient Status Patient sitting in bedside chair or w/c;Call light within reach;Telephone within reach   Support Present Post Treatment  None   Plan of Care Review   Plan Of Care Reviewed With patient   Physical Therapy Clinical Impression   Assessment Pt presents to treatment session with excellent tolerance, no c/o t/o. Pt required no physical assist for functionla mobility, able to negotiate stairs well (per pt request). Pt remains safe for D/C to home with assist when medically appropriate.   Anticipated Equipment Needs at Discharge (PT Clinical Impression) front wheeled walker   Anticipated Discharge  Disposition  home with assist       Therapist:   Hedda Slade, PT   Pager #: [phone]

## 2016-08-14 NOTE — Care Plan (Signed)
[facility]  Occupational Therapy Initial Evaluation    Patient Name: [name]  Date of Birth: [birth year]  Height: Height: 167.6 cm ('5\' 6"'$ )  Weight: Weight: 92.4 kg (203 lb 11.3 oz)  Room/Bed: 7NE/17  Payor: [employer] / Plan: [employer] OF [place] / Product Type: Managed Care /     Assessment:   Pt tolerated OT evaluation well. She is able to return home upon D/C and does not require any skilled OT upon D/C. Pt will need a FWW and was issued with a shoe horn for D/C.      Discharge Needs:   Equipment Recommendation: front wheeled walker  Discharge Disposition: home with assist    JUSTIFICATION OF DISCHARGE RECOMMENDATION   Based on current diagnosis, functional performance prior to admission, and current functional performance, this patient requires continued OT services in home with assist  in order to achieve significant functional improvements.    Plan:   Current Intervention:  (D/C OT)    To provide Occupational therapy services Evaluation Only,  .       The risks/benefits of therapy have been discussed with the patient/caregiver and he/she is in agreement with the established plan of care.       Subjective & Objective      08/14/16 1156   Therapist Pager   OT Assigned/ Pager # [phone]   Rehab Session   Document Type evaluation   Total OT Minutes: 27   Patient Effort excellent   Symptoms Noted During/After Treatment none   General Information   Patient Profile Reviewed? yes   Onset of Illness/Injury or Date of Surgery 08/13/16   General Observations of Patient Pt agreeable to OT eval with RN approval. Awaiting FWW for D/C today.   Pertinent History of Current Functional Problem 60 yo female POD #1 s/p L THA; anterior approach.   Medical Lines PIV Line   Respiratory Status room air   Existing Precautions/Restrictions full code;anterior hip precaution;fall precautions   Pre Treatment Status   Pre Treatment Patient Status Patient supine in bed;Call light within  reach;Telephone within reach   Support Present Pre Treatment  None   Mutuality/Individual Preferences   Patient Specific Interventions ambulate as tolerated with SBA and FWW   What Questions Do You Have About Your Health or Care? Pt asking questions about hip precautions and bed mobility/resting positions.   Living Environment   Lives With child(ren), adult   [place] Accessibility no concerns   Living Environment Comment 1 level living with no STE and tub/shower combo with a transfer bench   Functional Level Prior   Ambulation 0 - independent   Transferring 0 - independent   Toileting 0 - independent   Bathing 0 - independent   Dressing 0 - independent   Eating 0 - independent   Communication 0 - understands/communicates without difficulty   Prior Functional Level Comment independent with pain and dysfunction   Self-Care   Dominant Hand right   Usual Activity Tolerance moderate   Current Activity Tolerance good   Equipment Currently Used at Home yes   Equipment Currently Used at Home cane, straight;shower chair   Activity/Exercise/Self-Care Comment Pt will need a FWW for d/c.   Vital Signs   Vitals Comment VSS t/o   Pain Assessment   Pain Scale: Numbers, Pretreatment 3/10   Pain Scale: Numbers, Post-Treatment 3/10   Pain Location - Side Left   Pain Location hip   Coping/Psychosocial  Observed Emotional State accepting;calm;cooperative   Verbalized Emotional State acceptance   Family/Support System   Family/Support System, Persons family   Involvement in Care supportive of patient   Coping/Psychosocial Response Interventions   Plan Of Care Reviewed With patient   Trust Relationship/Rapport care explained;choices provided;emotional support provided;empathic listening provided;questions answered;questions encouraged;reassurance provided;thoughts/feelings acknowledged   Cognitive Assessment/Interventions   Behavior/Mood Observations alert;cooperative   Orientation Status  oriented x 4      Attention WNL/WFL   Follows Commands  WFL   RUE Assessment   RUE Assessment WFL- Within Functional Limits   LUE Assessment   LUE Assessment WFL- Within Functional Limits   Mobility Assessment/Training   Left Lower Extremity weight-bearing as tolerated (WBAT)   Bed Mobility Assessment/Treatment   Supine-Sit Independence supervision required;verbal cues required   Sit to Supine, Independence supervision required;verbal cues required   Comment  able to maintain anterior hip precautions    Transfer Assessment/Treatment   Sit-Stand Independence  stand-by assistance   Stand-Sit Independence  stand-by assistance   Sit-Stand-Sit, Assist Device rolling walker   Toilet Transfer Independence stand-by assistance   Toilet Transfer Assist Device rolling walker   Transfer Comment good carryover with VC"s   Gait Assessment/Treatment   Independence  modified independence   Assistive Device  rolling walker   Distance in Feet 30   Impairments  balance impaired   Comment ambulated to/from the bathroom with FWW without difficulty   Upper Body Dressing Assessment/Training   Position  sitting   Independence Level  independent   Lower Body Dressing Assessment/Training   Position sitting;standing   Independence Level  moderate assist (50% patient effort);verbal cues required   Impairments ROM decreased;pain;flexibility decreased   Comment pt needed help with doffing L sock- uses a sock aid at home; was able to don pants without difficulty using modified techniques   Toileting Assessment/Training   Position sitting   Independence Level  modified independence   Grooming Assessment/Training   Position standing   Independence Level modified independence   Comment hand hygiene    Balance Skill Training   Sitting Balance: Static good balance   Sitting, Dynamic (Balance) fair + balance   Sit-to-Stand Balance fair + balance   Standing Balance: Static fair + balance   Standing Balance: Dynamic fair + balance   Therapeutic Exercise/Activity    Comment Issued pt with a LH shoe horn to don shoes without difficulty.   Post Treatment Status   Post Treatment Patient Status Patient sitting in bedside chair or w/c;Call light within reach;Telephone within reach   Support Present Post Treatment  None   Planned Therapy Interventions, OT Eval   Planned Therapy Interventions (D/C OT)   Occupational Therapy Clinical Impression   Functional Level at Time of Session Pt tolerated OT evaluation well. She is able to return home upon D/C and does not require any skilled OT upon D/C. Pt will need a FWW and was issued with a shoe horn for D/C.   Criteria for Skilled Therapeutic Interventions Met (OT Eval) no problems identified which require skilled intervention   Therapy Frequency Evaluation Only   Anticipated Equipment Needs at Discharge front wheeled walker   Anticipated Discharge Disposition home with assist   Evaluation Complexity Justification   Occupational Profile Review Brief history   Performance Deficits 1-3 deficits;Pain;Range of motion   Clinical Decision Making Moderate analytic complexity   Evaluation Complexity Moderate       Therapist:   Raiford Noble, OT 08/14/2016 13:22  Pager #:  Advance

## 2016-08-14 NOTE — Nurses Notes (Signed)
Insurance unable to pay for walker and have delivered to patient. Script given to patient. States she has had a conversation with insurance company and approval pending. Patient was instructed by insurance representative to purchase walker and then submit bill to insurance company. Medications delivered to rom from discharge pharmacy.

## 2016-08-14 NOTE — Care Management Notes (Signed)
[facility] Management Initial Evaluation    Patient Name: [name]  Date of Birth: [birth year]  Sex: female  Date/Time of Admission: 08/13/2016  7:39 AM  Room/Bed: 7NE/17  Payor: [employer] / Plan: [employer] OF [place] / Product Type: Managed Care /   PCP: Derek Jack, APRN,FNP-BC    Pharmacy Info:   Preferred Pharmacy     [place] [address]    [place] [address]    Phone: [phone] Fax: [phone]    Not a 24 hour pharmacy; exact hours not known        Emergency Contact Info:   Extended Emergency Contact Information  Primary Emergency Contact: Allie [place]  Mobile Phone: [phone]  Relation: Daughter    History:   [name] is a 60 y.o., female, admitted s/p Left total hip arthroplasty    Height/Weight: 167.6 cm (5\' 6" ) / 92.4 kg (203 lb 11.3 oz)     LOS: 1 day   Admitting Diagnosis: S/P hip replacement [Z96.649]    Assessment:      08/14/16 1040   Assessment Details   Assessment Type Admission   Date of Care Management Update 08/14/16   Date of Next DCP Update 08/17/16   Care Management Plan   Discharge Planning Status initial meeting   Projected Discharge Date 08/14/16   CM will evaluate for rehabilitation potential yes   Patient choice offered to patient/family yes   Form for patient choice reviewed/signed and on chart yes   Discharge Needs Assessment   Equipment Currently Used at Home none   Equipment Needed After Discharge walker, rolling   Discharge Facility/Level Of Care Needs Home with DME (code 1)   Transportation Available family or friend will provide   Referral Information   Admission Type inpatient   Address Verified verified-no changes   Arrived From home or self-care   Insurance Verified verified-no change   ADVANCE DIRECTIVES   Does the Patient have an Advance Directive? Yes, Patient Does Have Advance Directive for Healthcare Treatment   Type of Advance  Directive Completed Medical Power of Attorney   Copy of Advance Directives in Chart? 0   Employment/Financial   Patient has Prescription Coverage?  Yes   Financial Concerns none   Living Environment   Select an age group to open "lives with" row.  Adult   Lives With child(ren), adult   Living Arrangements house   Home Safety   Home Assessment: No Problems Identified   Home Accessibility no concerns       Discharge Plan:  Home with DME (code 60)    60 year old female, s/p Left total hip arthroplasty. Per service, plan for discharge to home today. PT/OT recommending a front wheel walker at discharge. Patient states that she tried to get this pre-operatively and her insurance wasn't in contract with the company she went to. Per patient, her insurance company told her she can use [employer] Referral sent to [facility] for delivery to bedside. Patient reports that she lives with her daughter and son in law. They will provide transportation and assistance at discharge. MSW to follow.    Case Manager: Larence Penning  Phone: [phone]

## 2016-08-14 NOTE — Care Management Notes (Signed)
Referral Information  ++++++ Placed Provider #1 ++++++  Case Manager: Jessica Nicholson  Provider Type: DME  Address:  ,    Contact:    Fax:   Fax:

## 2016-08-14 NOTE — Care Plan (Signed)
Problem: Patient Care Overview (Adult,OB)  Goal: Plan of Care Review(Adult,OB)  The patient and/or their representative will communicate an understanding of their plan of care   Outcome: Ongoing (see interventions/notes)  Patient resting in bed throughout the night. POC reviewed with patient.  Goal- Improve Ambulation- Ambulate in halls 150 feet. OOB in chair for meals.  Goal- Pain rated 5/10 or less. Elevation and Ice. PRN and SC Pain medications.  Goal- Maintain O2 SATS 92% or higher- Supplemental O2 if needed.   Goal- DVT Prevention- SCDs and frequent ROM.  Goal- Prevent skin Breakdown- Frequent reposition and supportive dressings utilized.   Goal- Fall Prevention- Frequent rounding, including checking the five Ps.   Goal: Individualization/Patient Specific Goal(Adult/OB)  Outcome: Ongoing (see interventions/notes)  Goal: Interdisciplinary Rounds/Family Conf  Outcome: Ongoing (see interventions/notes)    Problem: Perioperative Period (Adult)  Prevent and manage potential problems including:1. bleeding2. gastrointestinal complications3. hypothermia4. infection5. pain6. perioperative injury7. respiratory compromise8. situational response9. urinary retention10. venous [id number]. wound complications   Goal: Signs and Symptoms of Listed Potential Problems Will be Absent or Manageable (Perioperative Period)  Signs and symptoms of listed potential problems will be absent or manageable by discharge/transition of care (reference Perioperative Period (Adult) CPG).   Outcome: Ongoing (see interventions/notes)

## 2016-08-14 NOTE — Nurses Notes (Signed)
Discharged in stable condition to family via wheelchair accompanied by SA. All belongings taken

## 2016-08-14 NOTE — Progress Notes (Signed)
[facility]  Progress Note    Date: 08/14/2016  Time: 07:27    SUBJECTIVE: Sitting up in the chair this morning. Doing well. No acute events overnight. Tolerating PO; +void. Pain controlled with tramadol.    OBJECTIVE:      Filed Vitals:    08/13/16 2330 08/14/16 0224 08/14/16 0258 08/14/16 0554   BP: (!) 90/53  108/70    Pulse: 71  73    Resp: 17  17    Temp: 36.7 C (98.1 F)  36.6 C (97.9 F)    SpO2:  97%  96%     --------------------------------------------------------------------------------------------------------------  Gen: NAD, breathing nonlabored  Abdomen non distended  LLE:  Hip dressing c/d/i  palpable PT/DP pulses  sensation intact to light touch distal foot  Intact ankle pf/df   ---------------------------------------------------------------------------------------------------------------  CBC Results Differential Results   No results found for this or any previous visit (from the past 30 hour(s)). No results found for this or any previous visit (from the past 30 hour(s)).   BMP Results Other Chemistries Results   Results for orders placed or performed during the hospital encounter of 08/13/16 (from the past 30 hour(s))   BASIC METABOLIC PANEL, NON-FASTING - POST OP DAY 1    Collection Time: 08/14/16  4:35 AM   Result Value    SODIUM 134 (L)    POTASSIUM 4.4    CHLORIDE 105    CO2 TOTAL 26    GLUCOSE 113    BUN 13    CREATININE 0.65    No results found for this or any previous visit (from the past 30 hour(s)).   Liver/Pancreas Enzyme Results Liver Function Results   No results found for this or any previous visit (from the past 30 hour(s)). No results found for this or any previous visit (from the past 30 hour(s)).   Cardiac Results Coags Results   No results found for this or any previous visit (from the past 30 hour(s)). No results found for this or any previous visit (from the past 30 hour(s)).              ----------------------------------------------------------------------------------------------------------------  Drains: None  Cultures: None    ASSESSMENT:  60 y.o. female s/p Left total hip arthroplasty      PLAN:  - Weightbearing: WBAT, anterior hip precautions  - PT/OT: ordered; PT recommending home with assistnace  - DVT prophylaxis: SCDs; Aspirin 81 mg BID starting today  - Antibiotics: Ancef x 24 hours complete this monring  - Pain: po, IV  - Dressing: located left hip, ortho to manage, will change POD #2 if still in house  - Labs: AM labs H/H 9.4/28.6-->acute surgical blood loss anemia, asymptomatic, monitor; Na 134-->mild hyponatremia, monitor  - Imaging: postop xray completed, implants in good positioning  - Diet: Regular  - Encourage OOB with assistance. Meals in chair.  - Dispo: d/c to home likely today  - Follow-up: will see back in Dr. Wandra Scot clinic 11/14. Order placed in [place].      Guss Bunde, MD  Resident PGY-2  Department of Orthopaedics  Guss Bunde, MD  08/14/2016, 07:30

## 2016-08-14 NOTE — Nurses Notes (Signed)
AVS reviewed with patient. Medications with handouts along with activity, DVT prevention, fall prevention. S/s of infection and when to call, dressing changes and follow up thoroughly reviewed with no further questions noted. PIV removed and tolerated well. Extra ted hose and dressing supplies along with nasal swabs sent with patient. D/c pharmacy to deliver medications to room before leaving. FWW to be delivered to room. Family on way to provide transportation home. Patient to be taken to lobby via wheelchair when ready.

## 2016-08-17 ENCOUNTER — Telehealth (HOSPITAL_BASED_OUTPATIENT_CLINIC_OR_DEPARTMENT_OTHER): Payer: Self-pay | Admitting: Orthopaedic Surgery

## 2016-08-17 NOTE — Telephone Encounter (Signed)
Called to see how the patient was doing.  Left message on voicemail to call me at [phone]

## 2016-09-01 ENCOUNTER — Ambulatory Visit: Payer: 59 | Attending: Orthopaedic Surgery | Admitting: Orthopaedic Surgery

## 2016-09-01 ENCOUNTER — Encounter (HOSPITAL_BASED_OUTPATIENT_CLINIC_OR_DEPARTMENT_OTHER): Payer: Self-pay | Admitting: Orthopaedic Surgery

## 2016-09-01 VITALS — BP 134/63 | HR 78 | Temp 97.5°F | Ht 65.43 in | Wt 198.6 lb

## 2016-09-01 DIAGNOSIS — Z471 Aftercare following joint replacement surgery: Secondary | ICD-10-CM | POA: Insufficient documentation

## 2016-09-01 DIAGNOSIS — Z96642 Presence of left artificial hip joint: Secondary | ICD-10-CM | POA: Insufficient documentation

## 2016-09-01 DIAGNOSIS — Z7982 Long term (current) use of aspirin: Secondary | ICD-10-CM | POA: Insufficient documentation

## 2016-09-01 MED ORDER — NAPROXEN 500 MG TABLET
500.00 mg | ORAL_TABLET | Freq: Two times a day (BID) | ORAL | 2 refills | Status: DC
Start: 2016-09-01 — End: 2017-09-22

## 2016-09-01 NOTE — Progress Notes (Signed)
[facility]   DEPARTMENT OF ORTHOPAEDICS    SUBJECTIVE  [name] is a 60 y.o. female who returns to clinic today post operatively from a left total hip arthroplasty which was performed on 08/13/16.  The patient is doing well post operatively and has been staying active.  The patient reports mild pain in her hip and states that her pain is now a 2/10. She has some thigh pain and lateral thigh numbness. Her pain has greatly improved since before her surgery. she is currently using a walker or cane for ambulation. she is taking ASA daily for DVT Prophylaxis and will continue to do so for the next 3 weeks.  she is eating well and reports regular bowel movements. She is taking tramadol at night for pain but doesn't think it is greatly helping her, so she would like to stop it. She does not like taking narcotic medication.     REVIEW OF SYSTEMS  General: negative for fevers or night sweats  Cardiovascular: negative for chest pain  Respiratory: negative for shortness of breath  Gastrointestinal: negative for digestive problems  Genitourinary: negative for urinary problems  Musculoskeletal: positive for hip pain, negative for knee pain     All other systems reviewed and negative    PHYSICAL EXAMINATION   BP 134/63   Pulse 78   Temp 36.4 C (97.5 F) (Thermal Scan)    Ht 1.662 m (5' 5.43")   Wt 90.1 kg (198 lb 10.2 oz)   BMI 32.62 kg/m2  GEN:   NAD  PULM:   Normal respiratory effort.    NEURO:   Alert and oriented to person, place and time.    PSYCHOSOCIAL:   Pleasant.  Normal affect.    WOUND/INCISION:   Wound margins intact and healing well.  No signs of erythema, ecchymosis, hypergranulation, hernia or drainage.       RADIOGRAPHS  Radiographs were reviewed at this visit.    X-Ray left hip was interpreted by MD in clinic to show well positioned and aligned implants with no acute fracture present.     ASSESSMENT     ICD-10-CM    1. S/P hip replacement, left [id number] Left Hip Joint  [facility] x-ray       PLAN  [name] is doing well. I encouraged her to stay active. She can progress to a cane and off a cane as tolerated. She can take tramadol as needed and does not have to take it on a regular basis. She is okay to drive at this point as long as she isn't taking tramadol before driving. I told her to practice driving in an open parking lot before driving on major roads. She will continue ASA for DVT prophylaxis. I sent naproxen to her pharmacy to take instead of tramadol. We will see her back in 4 weeks for follow-up and xrays. Orders were placed. She can call us sooner with new or worsening concerns. Her questions were answered, and she agreed to this plan.    Katie L Seifried, PA-C 09/01/2016, 09:54     Late entry for 09/01/16. I personally saw and evaluated the patient. See mid-level's note for additional details. My findings/participation are good recovery.    [name], MD        Cc:   Derek Jack, APRN,FNP-BC  [place] [id number]

## 2016-09-14 ENCOUNTER — Ambulatory Visit: Payer: 59 | Attending: Orthopaedic Surgery | Admitting: Orthopaedic Surgery

## 2016-09-14 ENCOUNTER — Telehealth (HOSPITAL_BASED_OUTPATIENT_CLINIC_OR_DEPARTMENT_OTHER): Payer: Self-pay | Admitting: Orthopaedic Surgery

## 2016-09-14 ENCOUNTER — Encounter (HOSPITAL_BASED_OUTPATIENT_CLINIC_OR_DEPARTMENT_OTHER): Payer: Self-pay | Admitting: Orthopaedic Surgery

## 2016-09-14 ENCOUNTER — Ambulatory Visit (HOSPITAL_BASED_OUTPATIENT_CLINIC_OR_DEPARTMENT_OTHER): Payer: 59

## 2016-09-14 VITALS — BP 148/78 | HR 89 | Temp 97.5°F | Ht 65.2 in | Wt 201.1 lb

## 2016-09-14 DIAGNOSIS — Z96642 Presence of left artificial hip joint: Secondary | ICD-10-CM | POA: Insufficient documentation

## 2016-09-14 DIAGNOSIS — Z09 Encounter for follow-up examination after completed treatment for conditions other than malignant neoplasm: Secondary | ICD-10-CM | POA: Insufficient documentation

## 2016-09-14 LAB — SEDIMENTATION RATE: ERYTHROCYTE SEDIMENTATION RATE (ESR): 15 mm/h (ref 0–30)

## 2016-09-14 LAB — C-REACTIVE PROTEIN(CRP),INFLAMMATION: CRP INFLAMMATION: 11.9 mg/L — ABNORMAL HIGH (ref <=8.0)

## 2016-09-14 MED ORDER — SULFAMETHOXAZOLE 800 MG-TRIMETHOPRIM 160 MG TABLET
1.00 | ORAL_TABLET | Freq: Two times a day (BID) | ORAL | 0 refills | Status: AC
Start: 2016-09-14 — End: 2016-09-21

## 2016-09-14 NOTE — Telephone Encounter (Signed)
Telephone Encounter  Encounter Date: 09/14/2016  Fuller Canada, RN      [] Hide copied text  Received call from patient that about a week ago her incision "opened up"  She denies any fever or chills and no significant drainage.  Asked that she come to the clinic so we could take a look at it.  She will be here at 1230p for a wound check      Electronically signed by Fuller Canada, RN at 09/14/16 [phone]        Telephone on 09/14/2016             Detailed Report        Corrected note as above (entered on wrong patient)  The above note is correct and the patient it should have been entered on is [name], [name] [id number]

## 2016-09-14 NOTE — Progress Notes (Signed)
[facility]   DEPARTMENT OF ORTHOPAEDICS    SUBJECTIVE  [name] is a 60 y.o. female who returns to clinic today post operatively from a left total hip arthroplasty which was performed on 08/13/16 with Dr. Sharlene Motts. She is here for an unscheduled visit. She states that over the past weekend she noticed an area of her incision that has opened up. She says that she has noticed a small amount of clear/bloody drainage from the area. She denies any surrounding erythema. She denies any increased pain in her hip. She denies any fever/chills, N/V, constipation, diarrhea or any other signs of systemic illness.    REVIEW OF SYSTEMS  General: Negative for fevers or night sweats  Cardiovascular: Negative for chest pain  Respiratory: Negative for shortness of breath  Gastrointestinal: Negative for digestive problems  Genitourinary: Negative for urinary problems  Musculoskeletal: Negative hip pain, negative for knee pain     All other systems reviewed and negative    PHYSICAL EXAMINATION   BP (!) 148/78   Pulse 89   Temp 36.4 C (97.5 F) (Thermal Scan)    Ht 1.656 m (5' 5.2")   Wt 91.2 kg (201 lb 1 oz)   BMI 33.26 kg/m2  GEN:   NAD  CV:   PT and DP pulses present bilaterally.  PULM:   Normal respiratory effort.    MS:   Painless left hip ROM.  NEURO:   Alert and oriented to person, place and time.    PSYCHOSOCIAL:   Pleasant.  Normal affect.    WOUND/INCISION:   Wound margins intact and healing well except for two areas at mid incision where it looks like she is having a stitch reaction. No signs of erythema or any significant drainage.      RADIOGRAPHS  Radiographs were reviewed at this visit.      ASSESSMENT     ICD-10-CM    1. Status post total replacement of left hip Z96.642 C-REACTIVE PROTEIN(CRP),INFLAMMATION     SEDIMENTATION RATE       PLAN  Delcenia Inman is approximately 4 weeks s/p left total hip arthroplasty. We placed Iodosorb on the two areas of mid incision where it looks  like there is a stitch reaction present. She is going to keep the Iodosorb on until we see her back on Wednesday 11/29. We are going to check ESR and CRP today. We also gave her a 7 day supply of Bactrim to begin taking today. We will see her back on Wednesday in Dr. Wandra Scot clinic and see how the incision is doing. The patient was instructed to call with any new or concerning symptoms. All of the patient's questions were answered and she agreed to this plan.    Lucious Groves Ricchio, PA-C 09/14/2016, 13:14     Cc:   Derek Jack, APRN,FNP-BC  [address]  [place] [address]      INBASKET  Dr. Sharlene Motts    I personally saw and evaluated the patient. See mid-level's note for additional details. My findings/participation are Status post total replacement of left hip        Kelton Pillar, MD

## 2016-09-16 ENCOUNTER — Encounter (HOSPITAL_BASED_OUTPATIENT_CLINIC_OR_DEPARTMENT_OTHER): Payer: Self-pay | Admitting: Orthopaedic Surgery

## 2016-09-16 ENCOUNTER — Ambulatory Visit: Payer: 59 | Attending: Orthopaedic Surgery | Admitting: Orthopaedic Surgery

## 2016-09-16 VITALS — BP 140/80 | HR 86 | Temp 97.9°F | Ht 65.35 in | Wt 198.4 lb

## 2016-09-16 DIAGNOSIS — Z96642 Presence of left artificial hip joint: Secondary | ICD-10-CM | POA: Insufficient documentation

## 2016-09-16 DIAGNOSIS — Z4802 Encounter for removal of sutures: Secondary | ICD-10-CM | POA: Insufficient documentation

## 2016-09-16 DIAGNOSIS — Z471 Aftercare following joint replacement surgery: Secondary | ICD-10-CM | POA: Insufficient documentation

## 2016-09-16 NOTE — Progress Notes (Signed)
PATIENT NAME: [place] NUMBER:  [id number]  DATE OF SERVICE: 09/16/2016  DATE OF BIRTH:  Aug 30, 1956    CLINIC NOTE    SUBJECTIVE:  [name] returns to clinic for a wound check.  She came in to see Dr. Percell Miller 2 days ago.  She is about a month out from a left total hip replacement.  She says overall she is doing great.  She is extremely happy with her hip.  She has had a pretty smooth and easy recovery, she says.  She says she has no discomfort or pain in the hip.  She is walking very smoothly without even the assistance of a cane and feels that her hip is functioning very well.  She denies any fever, chills, sweats or feeling ill.  She did report a couple days ago having the incision open and had some small bloody drainage right near her groin crease.  The area was evaluated by Dr. Percell Miller a couple days ago and felt this could be a stitch reaction.  Some Iodosorb was placed on this and she was given a prophylactic course of antibiotics.  She is here today for followup.    OBJECTIVE:  On physical examination, she walks very well in the clinic.  She has a very smooth appearing gait.  There is no limp.  There is no antalgia.  She complains of no pain with walking.  There is really no swelling around the hip.  There is no redness or erythema.  She has smooth range of motion of the hip, flexion about 95 degrees, internal rotation 15, external rotation 20, adduction about 10 and abduction about 25, all smooth with no irritability or pain.  On examination of her incision, I did clean off the Iodosorb and I see two 2-3 mm punctate areas around the incision that has some superficial dehiscence with some evidence of where there was some skin necrosis.  There is good beefy red granulation tissue underneath.  There are no signs of any tracking down past the subcutaneous tissue, and I cannot express any drainage from this area.  There was a small portion of a Vicryl stitch visible in the proximal  area.    ASSESSMENT:  Likely suture reaction, otherwise doing very well status post left hip replacement.    PLAN:  I discussed [name] that I think this area looks pretty healthy.  There is good granulation tissue with no evidence of any deep connection.  She has no systemic signs of infection and no local signs of infection around the hip.  She is very pleased and happy with how her hip feels.  She is tolerating her prophylactic antibiotics well, so I am going to have her finish that out.  I did use a sterile suture removal kit and clipped the small Vicryl stitch that was in the proximal part of the incision, and I instructed her on damp-to-dry dressings there twice a day.  I want to see her back in 2 weeks at her regularly scheduled 6 week followup appointment with left hip x-rays and a wound exam.  I went over in detail the signs of   infection and told her to call me urgently if anything should recur.  I think this should heal up well with local wound care.        [name] Hey, MD  Assistant Professor   [facility] Department of Orthopaedics  DD:  09/16/2016 11:20:54  DT:  09/16/2016 22:40:15 KG  D#:  [id number]

## 2016-09-29 ENCOUNTER — Encounter (HOSPITAL_BASED_OUTPATIENT_CLINIC_OR_DEPARTMENT_OTHER): Payer: 59 | Admitting: Orthopaedic Surgery

## 2016-09-29 ENCOUNTER — Other Ambulatory Visit (HOSPITAL_BASED_OUTPATIENT_CLINIC_OR_DEPARTMENT_OTHER): Payer: Self-pay | Admitting: Radiology

## 2016-09-30 ENCOUNTER — Encounter (HOSPITAL_BASED_OUTPATIENT_CLINIC_OR_DEPARTMENT_OTHER): Payer: Self-pay | Admitting: Orthopaedic Surgery

## 2016-09-30 ENCOUNTER — Ambulatory Visit
Admission: RE | Admit: 2016-09-30 | Discharge: 2016-09-30 | Disposition: A | Discharge status: Home / Self Care | Payer: 59 | Source: Ambulatory Visit | Attending: Orthopaedic Surgery | Admitting: Orthopaedic Surgery

## 2016-09-30 ENCOUNTER — Ambulatory Visit (HOSPITAL_BASED_OUTPATIENT_CLINIC_OR_DEPARTMENT_OTHER): Payer: 59 | Admitting: Orthopaedic Surgery

## 2016-09-30 VITALS — BP 151/83 | HR 89 | Temp 96.3°F | Ht 65.79 in | Wt 202.2 lb

## 2016-09-30 DIAGNOSIS — Z471 Aftercare following joint replacement surgery: Secondary | ICD-10-CM | POA: Insufficient documentation

## 2016-09-30 DIAGNOSIS — Z96642 Presence of left artificial hip joint: Secondary | ICD-10-CM | POA: Insufficient documentation

## 2016-09-30 NOTE — Progress Notes (Signed)
[facility]   DEPARTMENT OF ORTHOPAEDICS    SUBJECTIVE  [name] is a 60 y.o. female who returns to clinic today post operatively from a left total hip arthroplasty which was performed on 08/13/16.  The patient is doing well post operatively. We had been watching a small area of her mid incision where she was having a stitch reaction. This area has healed up nicely but she states that she has a couple other areas where she is having more stitch reactions. She has minimal pain in her hip. She still has some numbness in the lateral left hip. She is staying as active as possible and states that she is ready to return to daily workouts. She denies any fever/chills, N/V, constipation, diarrhea or any other signs of systemic illness. The patient has not had any changes in her health since she was last seen by provider.    REVIEW OF SYSTEMS  General: Negative for fevers or night sweats  Cardiovascular: Negative for chest pain  Respiratory: Negative for shortness of breath  Gastrointestinal: Negative for digestive problems  Genitourinary: Negative for urinary problems  Musculoskeletal: Negative for hip pain, negative for knee pain     All other systems reviewed and negative    PHYSICAL EXAMINATION   BP (!) 151/83   Pulse 89   Temp 35.7 C (96.3 F) (Thermal Scan)    Ht 1.671 m (5' 5.79")   Wt 91.7 kg (202 lb 2.6 oz)   BMI 32.84 kg/m2  GEN:   NAD  CV:   PT and DP pulses present bilaterally.  PULM:   Normal respiratory effort.    MS:  Left hip ROM with 95 degrees flexion, 20 degrees internal rotation and 25 degrees external rotation. No pain with hip ROM.  NEURO:   Alert and oriented to person, place and time.    PSYCHOSOCIAL:   Pleasant.  Normal affect.    WOUND/INCISION:   Wound margins intact and healing well except for two small areas of the incision where she is having a stitch reaction, one at mid incision and one at distal incision.      RADIOGRAPHS  Radiographs were  performed and reviewed at this visit.    X-Ray of left hip was interpreted by MD in clinic to show well positioned and aligned implants that have not migrated when compared to prior films. No evidence of loosening, fractures or dislocation.    ASSESSMENT     ICD-10-CM    1. Status post total replacement of left hip Z96.642 XR [facility] HIP SERIES LEFT       PLAN  [name] is approximately 6 weeks s/p left total hip arthroplasty. She is doing well post operatively. We placed Iodosorb on the two areas of the incision where she is having a stitch reaction and dressed the area. She is going to keep the dressing on for 3 days and will then clean the area and begin wet to dry dressings with daily dressing changes. We think these are going to heal up nicely for her since she took great care of the last wound and it healed very well. The patient was reminded about antibiotic prophylaxis before future procedures. We encouraged the patient to continue to stay as active as possible. The patient was told that she will have a follow up visit scheduled in the [facility] in 6 weeks at which time left hip x-rays will be taken. Orders were placed in the system.  The patient was instructed to call with any new or concerning symptoms. All of the patient's questions were answered and she agreed to this plan.    Kristianna Ricchio, PA-C 09/30/2016, 09:53     Late entry for 09/30/16. I personally saw and evaluated the patient. See mid-level's note for additional details. My findings/participation are another vicryl stitch reaction.    [name], MD        Cc:   Derek Jack, APRN,FNP-BC  [place] [id number]

## 2016-10-22 ENCOUNTER — Ambulatory Visit (HOSPITAL_BASED_OUTPATIENT_CLINIC_OR_DEPARTMENT_OTHER): Payer: Self-pay | Admitting: Orthopaedic Surgery

## 2016-10-25 ENCOUNTER — Other Ambulatory Visit: Payer: Self-pay

## 2016-11-10 ENCOUNTER — Other Ambulatory Visit (HOSPITAL_COMMUNITY): Payer: Self-pay | Admitting: THORACIC SURGERY CARDIOTHORACIC VASCULAR SURGERY

## 2016-11-10 DIAGNOSIS — I350 Nonrheumatic aortic (valve) stenosis: Secondary | ICD-10-CM

## 2016-11-11 ENCOUNTER — Ambulatory Visit (HOSPITAL_BASED_OUTPATIENT_CLINIC_OR_DEPARTMENT_OTHER): Payer: 59

## 2016-11-11 ENCOUNTER — Ambulatory Visit
Admission: RE | Admit: 2016-11-11 | Discharge: 2016-11-11 | Disposition: A | Discharge status: Home / Self Care | Payer: 59 | Source: Ambulatory Visit | Attending: Orthopaedic Surgery | Admitting: Orthopaedic Surgery

## 2016-11-11 ENCOUNTER — Ambulatory Visit (HOSPITAL_BASED_OUTPATIENT_CLINIC_OR_DEPARTMENT_OTHER): Payer: 59 | Admitting: Orthopaedic Surgery

## 2016-11-11 ENCOUNTER — Encounter (HOSPITAL_COMMUNITY): Payer: Self-pay

## 2016-11-11 ENCOUNTER — Encounter (HOSPITAL_BASED_OUTPATIENT_CLINIC_OR_DEPARTMENT_OTHER): Payer: Self-pay | Admitting: Orthopaedic Surgery

## 2016-11-11 VITALS — BP 162/87 | HR 92 | Temp 97.0°F | Ht 65.71 in | Wt 197.5 lb

## 2016-11-11 DIAGNOSIS — M545 Low back pain, unspecified: Secondary | ICD-10-CM

## 2016-11-11 DIAGNOSIS — Z96642 Presence of left artificial hip joint: Principal | ICD-10-CM

## 2016-11-11 DIAGNOSIS — M1611 Unilateral primary osteoarthritis, right hip: Secondary | ICD-10-CM | POA: Insufficient documentation

## 2016-11-11 DIAGNOSIS — M5432 Sciatica, left side: Secondary | ICD-10-CM | POA: Insufficient documentation

## 2016-11-11 DIAGNOSIS — Z471 Aftercare following joint replacement surgery: Secondary | ICD-10-CM | POA: Insufficient documentation

## 2016-11-11 DIAGNOSIS — M1612 Unilateral primary osteoarthritis, left hip: Secondary | ICD-10-CM

## 2016-11-11 LAB — SEDIMENTATION RATE: ERYTHROCYTE SEDIMENTATION RATE (ESR): 10 mm/h (ref 0–30)

## 2016-11-11 LAB — C-REACTIVE PROTEIN(CRP),INFLAMMATION
CRP INFLAMMATION: 3.4 mg/L (ref <=8.0)
CRP INFLAMMATION: 3.4 mg/L (ref <=8.0)

## 2016-11-11 NOTE — Progress Notes (Signed)
[facility]   DEPARTMENT OF ORTHOPAEDICS    SUBJECTIVE  [name] is a 61 y.o. female who returns to clinic today postoperatively from a left total hip arthroplasty that was performed on 08/13/16. She has done well. She did have a stitch reaction at her last visit. This has gotten better but she continues to have 3 tiny holes next to her incision. They aren't over her incision. She says she will occasionally have a tiny drop of blood on a band aide but otherwise denies drainage.  She denies increased pain and constitutional symptoms. She also has a lesion that is similar appearing on her upper lip. She says it has been slow to heal and has been present for over 1 year now. She says it will scab over. She also had difficulty with wound healing after 2 hernia surgeries. One required a wound vac. She was a smoker 3 years ago but hasn't smoked since. She says she has a healthy diet and preop albumin and prealbumin were reviewed and WNL. Other than her wound healing, she is very happy with her results. She did have an episode of low back pain that radiated down her posterior left leg. It was sharp and radiated into the middle of the left foot. She was staying at her son's house and slept on an unfamiliar bed. Her pain got better when she got home and was able to sleep in her own bed. She has back pain every day. She hasn't seen a spine specialist. Her right hip has also started bothering her more frequently is and almost exactly the same pain she had before her left hip replacement. She may have an upcoming valve replacement in the future that is being worked up. She is concerned about which surgery she should have done first.     REVIEW OF SYSTEMS  General: negative for fevers or night sweats  Cardiovascular: negative for chest pain  Respiratory: negative for shortness of breath  Gastrointestinal: negative for digestive problems  Genitourinary: negative for urinary  problems  Musculoskeletal: positive for hip pain, positive for low back pain     All other systems reviewed and negative    PHYSICAL EXAMINATION   BP (!) 162/87   Pulse 92   Temp 36.1 C (97 F)   Ht 1.669 m (5' 5.71")   Wt 89.6 kg (197 lb 8.5 oz)   BMI 32.17 kg/m2  GEN:   NAD  PULM:   Normal respiratory effort.    MS: Patient ambulates with no limp present. Smooth and painless left hip rotation.   NEURO:   Alert and oriented to person, place and time.    PSYCHOSOCIAL:   Pleasant.  Normal affect.    WOUND/INCISION:   Wound margins intact and healing well.  3 small lesions present at proximal and distal end of incision. The lesions are off to the side of the incision but not directly on incision. No active drainage present. The appear to be tiny ulcers. She also has a similar lesion on her upper lip.      RADIOGRAPHS  Radiographs were performed and reviewed at this visit.    X-Ray left hip was interpreted by MD in clinic to show well positioned and aligned revision implants that appear stable when compared to prior films.   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    ASSESSMENT     ICD-10-CM  1. S/P hip replacement, left V69.794 Left [facility] x-ray     ESR     C Reactive Protein   2. Low back pain M54.5 [facility] Consult   3. Sciatica of left side M54.32 Sp[facility] Consult   4. Arthritis of right hip M16.11        PLAN  [name] is doing well from her left hip replacement. We encouraged her to stay active. We suspect the low back pain and posterior leg pain she had over the holidays was caused from sciatica. I referred her to the spine center for further evaluation. Regarding her right hip, she has severe right hip arthritis and is a candidate for right total hip arthroplasty in the future. It will be a quality of life call on her part. It would be okay for her to get a heart valve replacement done first. We would just  need to know what type of valve she had. If she had a mechanical valve and was put on coumadin, we would need to know her cardiologist's recommendations for stopping coumadin prior to surgery. Regarding her wound prolonged wound healing, we are not sure what is causing this. Active skin infection does not appear to be present and she seems to have a past medical history significant for delayed wound healing. We want her to continue dry dressings twice daily. I ordered ESR and CRP to rule out infection. If they are elevated, we will plan for a hip aspiration. Our suspicion for infection is low but she may need a superificial I&D if her wound does not heal. We will see her back in 6 weeks for another wound check. She can call us sooner with new or worsening concerns. Her questions were answered, and she agreed to this plan.     Katie L Seifried, PA-C 11/11/2016, 08:50     Late entry for 11/11/16. I personally saw and evaluated the patient. See mid-level's note for additional details. My findings/participation are healing wound, but slow.    [name], MD        Cc:   [name] Jack, APRN,FNP-BC  [place] [id number]

## 2016-11-11 NOTE — Ancillary Notes (Signed)
[place] Record for: [name], [name]   Created: 11/11/2016 9:18:54 AM  MRN: [id number]  DOB: 04/07/1956  SSN: [id number]  Sex: Female  Height: 5 Feet 6 Inches - Weight: [place] Name:   Address:   [address]  [place] [place]: [place], [place] [address]  E-Mail:   Day Phone: [phone] Night Phone:  Other Phone:   Phone comments:   Call Back time:   Authorized contact: Lizzie-daughter   Intake Date: 11/11/2016 9:18:54 AM  PCP: Britta Mccreedy FNP, Raquel Sarna   RefMD: Sharlene Motts MD, Darl Householder Insurance: [employer]  Secondary Insurance:   Insurance Comments:      -------Referral Assignment------  Where was the original referral directed?: [facility]  Was a specific reviewer requested?: Unassigned Referral  How was the reviewer selected?: Rotation  Who requested the specific reviewer?:   How did you hear of our spine program?:   Is this a second opinion?:      -------Web [place]----------  [place]:   [place] of Birth:      ---------- 1st Review ----------     Review Date: 11/11/2016 1:32:14 PM  Review Completed by: Scheduled without Initial Review  Impression:   Disposition:   Appt w/ Colleague:   Colleague Name:   Appt How Soon:   Pre Treatment Type:   Pre Treatment Type Details:   Pre Treatment Other Test:   Appt Type:   Instructions:      ---------- Symptoms ----------     Chief complaint: low back pain  Diagnosis from Other MD:      Symptoms: Pain,Numbness and/or tingling  Other Symptom Description:   Pain Location: Low back,Leg  Other Pain Location Description:   Where is your pain the worst?: Low back  Pain Type: Dull/Aching,Intermittent  Pain Rating: 7  Does the pain radiate to other parts of your body? No   Radiate Where:   Other Description:   Does it radiate to the fingers?   Does it radiate below the elbow?   Which specific part of your arm?   Which fingers?   Which part of your arm does pain go to?   How does it radiate to the arm?   Does it radiate to the toes?   Does it radiate below the  knee?   Which specific part of your leg?   Which toes?   Which part of your leg does pain go to?   How does it radiate to the leg?   Additional pain information      Location of Numbness/tingling: Leg  Other Description:  Does numbness/tingling radiate to other parts of your body?:Yes  Where does the numbess/tingling radiate?:From low back to left leg  Other Description:  Does the numbness/tingling radiate below your elbow?:  Which specific part of your arm?:  Which fingers:  Which pat of your arm does the numbness/tingling go to?:  How does the numbness/tingling radiate to your arm?:  Does the numbness/tingling radiate below your knee?:Yes  Which specific part of your leg?:Left Mid calf  Which toes:  Which part of your leg does the numbness/tingling go to?:  How does the numbness/tingling radiate to your leg?:Back  Additional Numbness/tingling information:  Other Description:      Location of Weakness:   Other Description:   Additional Weakness Information:      The symptoms have been present for: more than 1 year  The symptoms began: 3-4 years but leg issue is recent   Was there  a specific event that caused your symptoms?: With no known cause  Additional Narrative Description:   Are you able to perform your daily activities with these symptoms?: Yes  Since what date have you been unable to perform your daily routine?:   The symptoms improve when you: Lie down,Other  Other activities that improve your symptoms: heat  The symptoms worsen when you: Walk,Other  Other activities that worsen your symptoms: getting out of bed     ---------- Work History ----------     Are you able to work?: Does not apply  Reasons for not working: Unemployed  Other Reason for not working:   Do you have Work Restrictions:   If applicable, maximum lifting restriction:   How long have you been unable to work:   Have you ever filed a W/C claim related to a neck or back injury?: no   Occupation:   Other Occupation Information:      ----------  Bowel/Bladder/Incontinence Issues ----------     Since the onset of symptoms, have you experienced any new problems urinating or having bowel movements?: No  Description:   Other Description:   How long have you had these bowel/bladder problems?:      ---------- Allergies ----------     Do you have any medication allergies? Yes  Allergies: Oregano,Stadol,Tomato  Other Details:   Allergic to Latex?: NKA  Allergic to intravenous contrast dyes?: Yes  Allergic to steroids?: NKA     ---------- Treatment/Testing ----------      Taking prescription medication for this problem?: Yes  Are you using any now?: Yes  Medication: Naprosyn/Naproxen; MD Name: Sharlene Motts MDMarland Kitchen; Date Prescribed: 1 year ; Dosage: 500; Times/Day: 1 a day  Have you received a Medrol dose pack for this problem?: Yes  When did you last take the dosepack?: 2015  Were your symptoms improved?: Yes  Would you describe your relief as: Small  How long did you experience that amount of relief?: Short term  Other Medrol dosepack information:      --------------- PT ---------------     PT: No  When Received:   Where Received:   Other where received information:   Visits:   Types:   Other Types:   Improved:      If improved, describe level of relief:   If improved, how long did you experience relief:   Other Physical Therapy Treatment Information:      ---------- Chiropractic Services ----------     Chiro: No  When:   Who:   Visits:   Types:   Other Types:   Were Symptoms Improved:   If improved, describe level of relief:   If improved, how long did you experience relief:   Other Chiropractic Treatment Information:      --------------- ESI ---------------     ESI: No  When:   Who:   Visits:   Types:   Improved:   If improved, describe level of relief:   If improved, how long did you experience relief:   Other Injection Treatment Information:      ---------- Diagnostic Tests ----------     Plain spine X-rays On:4/19/17MRN [id number] Where: [facility] PACS  Area(s) Scanned:  Lumbar  Do you have any of the following in case an MRI is ordered?:   Other MRI factors:      ---------- Past Medical History ----------     What other doctors/providers have treated you for these spine issues? Sharlene Motts MD, Marland Kitchen Specialty: Orthopaedic Surgery Date:  11/11/16    Ever diagnosed with Spine deformity?:   Prior neck or back surgery (1)?: No  When:   Who:   Area of neck/spine operated on:   Level:   Were symptoms improved?:   If improved, describe level of relief:  If improved, how long did you experience relief?:   Prior neck or back surgery (2)?:   When:   Who:   Area of neck/spine operated on:   Level:   Were symptoms improved?:   If improved, describe level of relief:  If improved, how long did you experience relief?:   Prior neck or back surgery (3)?:   When:   Who:   Area of neck/spine operated on:   Level:   Were symptoms improved?:   If improved, describe level of relief:  If improved, how long did you experience relief?:   Do you have any of the following assistive devices? None  How long have you required these assistive devices?   Currently being treated for any other medical condition?: Yes  Conditions: Hypertension  Other Conditions:   Type of neurologic disorder:   Cancer Type:   Other Treatment/Medication:   What blood thinners are you currently taking?: None  Which physicians are treating you for your other medical conditions?:   Do you smoke?: No  Are you pre-menopausal or post-menopausal?:   If recommended, are you willing to consider surgery?: Yes  What is your goal in seeking treatment?: "get rid of the back pain and found out what is wrong"  Other pertinent information/ general comments/ goals for treatment:      ---------- Care Coordinator Information ----------     Care Coordinator: Dr. Bernette Redbird  Activity Log:      Letter/Test Coordinator: Letters  Letter/Test Coordinator Log: 11/11/2016 1:31:44 PM Arrie Aran O'Dell]: Patient only had xrays. Confirmed appointment for 12-22-16 at 9am with Dr.  Donnal Debar. DO'Dell     Intake Specialist Comments: 11/11/2016 11:39:37 AM Arrie Aran O'Dell]: Attempted to contact patient to obtain his/her medical history.  Left message and phone  on answering machine for patient to return our call. Mailed call in reminder post card. [facility] Staff Comments:      Last Edited byMadelon Lips on 11/11/2016 1:32:36 PM  Last Review by:  on 11/11/2016 1:32:14 PM

## 2016-11-16 ENCOUNTER — Telehealth (HOSPITAL_COMMUNITY): Payer: Self-pay | Admitting: Orthopaedic Surgery

## 2016-11-16 NOTE — Telephone Encounter (Signed)
11/16/2016    Called and left message that ESR and CRP were completely normal verifying that it is highly unlikely that any infection is present.    [name], MD  11/16/2016, 11:29

## 2016-11-19 ENCOUNTER — Ambulatory Visit (HOSPITAL_BASED_OUTPATIENT_CLINIC_OR_DEPARTMENT_OTHER): Payer: 59 | Admitting: THORACIC SURGERY CARDIOTHORACIC VASCULAR SURGERY

## 2016-11-19 ENCOUNTER — Ambulatory Visit
Admission: RE | Admit: 2016-11-19 | Discharge: 2016-11-19 | Disposition: A | Discharge status: Home / Self Care | Payer: 59 | Source: Ambulatory Visit | Attending: THORACIC SURGERY CARDIOTHORACIC VASCULAR SURGERY | Admitting: THORACIC SURGERY CARDIOTHORACIC VASCULAR SURGERY

## 2016-11-19 ENCOUNTER — Encounter (HOSPITAL_COMMUNITY): Payer: Self-pay | Admitting: THORACIC SURGERY CARDIOTHORACIC VASCULAR SURGERY

## 2016-11-19 VITALS — BP 128/82 | HR 106 | Temp 97.6°F | Wt 199.5 lb

## 2016-11-19 DIAGNOSIS — I77810 Thoracic aortic ectasia: Secondary | ICD-10-CM | POA: Insufficient documentation

## 2016-11-19 DIAGNOSIS — M1612 Unilateral primary osteoarthritis, left hip: Secondary | ICD-10-CM | POA: Insufficient documentation

## 2016-11-19 DIAGNOSIS — I350 Nonrheumatic aortic (valve) stenosis: Principal | ICD-10-CM

## 2016-11-19 DIAGNOSIS — Z79899 Other long term (current) drug therapy: Secondary | ICD-10-CM | POA: Insufficient documentation

## 2016-11-19 DIAGNOSIS — I2510 Atherosclerotic heart disease of native coronary artery without angina pectoris: Secondary | ICD-10-CM | POA: Insufficient documentation

## 2016-11-19 DIAGNOSIS — I10 Essential (primary) hypertension: Secondary | ICD-10-CM | POA: Insufficient documentation

## 2016-11-19 DIAGNOSIS — Z87891 Personal history of nicotine dependence: Secondary | ICD-10-CM | POA: Insufficient documentation

## 2016-11-19 DIAGNOSIS — Z96642 Presence of left artificial hip joint: Secondary | ICD-10-CM | POA: Insufficient documentation

## 2016-11-19 DIAGNOSIS — Z90710 Acquired absence of both cervix and uterus: Secondary | ICD-10-CM | POA: Insufficient documentation

## 2016-11-19 NOTE — Patient Instructions (Addendum)
Return to clinic in one year with TTE and CT chest to evaluate the aortic stenosis and aortic aneurysm.   If you begin to have any increasing symptoms, shortness of breath, chest pain, or any other symptoms please call and we will bring you to clinic before a year.  If you experience a "tearing" sensation, please proceed to the nearest ER or call 911.  Maintain tight blood pressure control. Monitor your blood pressure at home and see your PCP/Cardioplogist for better BP control. Continue activity and exercise as tolerated.  Continue regular follow up with your PCP and Cardiologist.

## 2016-11-20 NOTE — Progress Notes (Signed)
Care Team:     Primary Care Providers:  Derek Jack, APRN,FNP-BC (General)    Referring Provider:  Joaquin Bend    Subjective:     [name]  is a 61 y.o. Caucasian female who was first told she had a murmur in 2015 during the inital work up period for left hip replacement for osteoarthritis.  She had no further testing done at that time.  In May 2017, she had a TTE which showed mild AI, mean AV gradient of 36 mmHg, peak gradient of 55 mmHg, and normal EF.  Cardiac cath showed dilated ascending aorta and mild nonobstructive CAD.  She was seen in the CT surgery clinic last in July 2017 with recommendations to proceed with left hip replacement as the AS was moderate and she was asymptomatic.  She returns today for 6 month follow up with repeat TTE for surveillance of the aortic stenosis.  She has done well since her left hip replacement.  She had prolonged wound healing but no active infection.  She continues to be followed by ortho.  She has been able to increase her activity level without much difficulty.  She continues to deny any SOB, dizziness, palpitations, syncope, LE edema.  She has sporadic chest pains that last only a few seconds and resolve spontaneously without intervention.  These do not occur daily, but a couple times per month.  She has been pretty active at home and is able to climb stairs without having to stop to rest.  She has no limitations to her activity level.  She is a former smoker.    Patient Active Problem List    Diagnosis Date Noted   . S/P hip replacement 08/13/2016   . Essential hypertension 02/12/2016   . Arthritis of left hip 02/05/2016     Past Medical History:   Diagnosis Date   . Aortic stenosis    . Arthritis    . Chronic pain    . Heart murmur     echo 02/2016, aortic stenosis   . Heartburn    . High blood pressure    . Nausea with vomiting    . Valvular disease     aortic valve stenosis, following with CT surgery, echo 02/2016 and cath 03/2016      Past Surgical  History:   Procedure Laterality Date   . HX CESAREAN SECTION  1984   . HX HERNIA REPAIR  2004, 2006, 2008    x3   . HX HYSTERECTOMY  2003     Outpatient Medications Prior to Visit:  celecoxib (CELEBREX) 200 mg Oral Capsule Take 1 Cap (200 mg total) by mouth Twice daily (Patient not taking: Reported on 09/14/2016)   loratadine (CLARITIN) 10 mg Oral Tablet Take 10 mg by mouth Once a day   multivitamin Oral Tablet Take 1 Tab by mouth Once a day   naproxen (NAPROSYN) 500 mg Oral Tablet Take 1 Tab (500 mg total) by mouth Twice daily with food   ondansetron (ZOFRAN) 4 mg Oral Tablet Take 1 Tab (4 mg total) by mouth Every 8 hours as needed for nausea/vomiting (Patient not taking: Reported on 09/01/2016)   sennosides-docusate sodium (SENOKOT-S) 8.6-50 mg Oral Tablet Take 1 Tab by mouth Every evening (Patient not taking: Reported on 09/14/2016)   traMADol (ULTRAM) 50 mg Oral Tablet Take 1 Tab (50 mg total) by mouth Every 6 hours as needed (Patient not taking: Reported on 09/14/2016)     No facility-administered medications prior to  visit.     Allergies   Allergen Reactions   . Iv Contrast Anaphylaxis   . Oregano      Hives     . Stadol [Butorphanol Tartrate] Mental Status Effect   . Tomato Hives/ Urticaria      Social History   Substance Use Topics   . Smoking status: Former Smoker     Packs/day: 1.00     Years: 35.00   . Smokeless tobacco: Never Used      Comment: quit 2014   . Alcohol use Yes      Comment: social only      Family Medical History     Problem Relation (Age of Onset)    Cancer Brother    Heart Disease Mother    Hypertension Father    No Known Problems Sister, Sister    Sudden Death no cause Mother         Review of Systems  Other than ROS in the HPI, all other systems were negative.  No fever or chills.  Denies any SOB, syncope.  ROS is negative except as mentioned in HPI.      Objective:     BP 128/82  Pulse (!) 106  Temp 36.4 C (97.6 F)  Wt 90.5 kg (199 lb 8.3 oz)  SpO2 95%  BMI 32.49 kg/m2  Physical  Exam:  General appearance: Appears healthy.  Alert; in no acute distress.  Pleasant.  Neck:  Supple. Trachea midline.  No JVD.  Eyes:  Conjunctiva clear.  EOM intact.  Pupils equal and reactive.  Cardiac: RRR. Holosystolic blowing murmur best heard at upper right sternal border/ 2nd intercostal space.  Respiratory: Respiratory effort normal and clear to auscultation  Abdomen: soft, non-tender. No masses,  no organomegaly  Extremities: no cyanosis, clubbing or edema present    Neurological:  CN grossly intact.  Normal gait.  Psychiatric:  Affect is appropriate.    Data Review  TTE done 11/19/16:  Mean gradient 22 mmHg; peak gradient 44 mmHg, AVA 0.97 cm^2.  Final report pending.    Assessment:     [name] is a 61 year old with moderate AS and dilated ascending aorta    Plan:     The results of the TTE were discussed with the patient and daughter.  The aortic valve gradients obtained during today's study indicate moderate AS.  She remains asymptomatic.  Her ascending aorta measure 43 mm.  We will plan to continue to follow these findings with serial studies.  She will follow up int he CT surgery clinic in 1 year with TTE and CT scan of the chest without contrast.  The natural history and disease process was discussed with the patient including when surgical intervention is pursued.  She was encouraged to continue her activities without any limitations with the exception of avoiding heavy power lifting.  She will follow up with her PCP for good blood pressure control.  She indicates understanding and agrees to this plan.  She was encouraged to contact our office with any questions or concerns.      Orpah Cobb [place], PA-C 11/20/2016, 00:59

## 2016-11-21 LAB — TRANSTHORACIC ECHOCARDIOGRAM - ADULT
AV mean gradient: 21
AV peak velocity post stress: 331
AVA VTI: 1
AVA Vmax: 1
AVA Vmax: 1
AVE E/e': 0
Biplane Simpson EF: 68
EF MEASUREMENT VALUE: 0
Interventricular Septum Diastolic Thickness by 2D: 0.9 cm
LA Volume Index: 27
LVIDD - 2D: 5.1 cm
LVIDS 2D: 3.8 cm
LVPWD: 1.1 cm
Lateral MV Annulus e': 0
MV E/A: 1
Medial MV Annulus e': 0
Please see Linked Document for Final Report.: ABNORMAL
Please see Linked Document for Final Report.: NORMAL
Please see Linked Document for Final Report.: NORMAL
RVSP: 0
TR VELOCITY: 0

## 2016-12-02 ENCOUNTER — Ambulatory Visit: Payer: 59 | Attending: PHYSICAL MEDICINE AND REHABILITATION | Admitting: PHYSICAL MEDICINE AND REHABILITATION

## 2016-12-02 ENCOUNTER — Encounter (INDEPENDENT_AMBULATORY_CARE_PROVIDER_SITE_OTHER): Payer: Self-pay | Admitting: PHYSICAL MEDICINE AND REHABILITATION

## 2016-12-02 ENCOUNTER — Ambulatory Visit (INDEPENDENT_AMBULATORY_CARE_PROVIDER_SITE_OTHER): Payer: Self-pay | Admitting: PHYSICAL MEDICINE AND REHABILITATION

## 2016-12-02 DIAGNOSIS — Z87891 Personal history of nicotine dependence: Secondary | ICD-10-CM | POA: Insufficient documentation

## 2016-12-02 DIAGNOSIS — M791 Myalgia: Secondary | ICD-10-CM

## 2016-12-02 DIAGNOSIS — M5432 Sciatica, left side: Secondary | ICD-10-CM

## 2016-12-02 DIAGNOSIS — M545 Low back pain, unspecified: Secondary | ICD-10-CM | POA: Insufficient documentation

## 2016-12-02 DIAGNOSIS — M7918 Myalgia, other site: Secondary | ICD-10-CM | POA: Insufficient documentation

## 2016-12-02 DIAGNOSIS — S39012A Strain of muscle, fascia and tendon of lower back, initial encounter: Secondary | ICD-10-CM | POA: Insufficient documentation

## 2016-12-02 DIAGNOSIS — M5430 Sciatica, unspecified side: Secondary | ICD-10-CM

## 2016-12-02 DIAGNOSIS — M79605 Pain in left leg: Secondary | ICD-10-CM

## 2016-12-02 DIAGNOSIS — I2510 Atherosclerotic heart disease of native coronary artery without angina pectoris: Secondary | ICD-10-CM | POA: Insufficient documentation

## 2016-12-02 DIAGNOSIS — Z90710 Acquired absence of both cervix and uterus: Secondary | ICD-10-CM | POA: Insufficient documentation

## 2016-12-02 DIAGNOSIS — Z79899 Other long term (current) drug therapy: Secondary | ICD-10-CM | POA: Insufficient documentation

## 2016-12-02 DIAGNOSIS — Z96642 Presence of left artificial hip joint: Secondary | ICD-10-CM | POA: Insufficient documentation

## 2016-12-02 DIAGNOSIS — M5416 Radiculopathy, lumbar region: Secondary | ICD-10-CM | POA: Insufficient documentation

## 2016-12-02 DIAGNOSIS — F40240 Claustrophobia: Secondary | ICD-10-CM

## 2016-12-02 DIAGNOSIS — M5136 Other intervertebral disc degeneration, lumbar region: Secondary | ICD-10-CM

## 2016-12-02 MED ORDER — ALPRAZOLAM 0.5 MG TABLET: Tab | ORAL | 0 refills | 0 days | Status: DC

## 2016-12-02 MED ORDER — METAXALONE 800 MG TABLET: 800 mg | Tab | Freq: Two times a day (BID) | ORAL | 2 refills | 0 days | Status: DC | PRN

## 2016-12-02 NOTE — Telephone Encounter (Signed)
Verbal order called to   [place], [place] [phone] (Phone)     Xanax 0.5 mg, #1 tab, 0 refills.  Directions take by mouth 60 minutes before MRI, WILL NEED DRIVER.  Left voice mail on patient's home phone.

## 2016-12-02 NOTE — Progress Notes (Signed)
To be dictated  Delray Alt, MD 12/02/2016, 12:29

## 2016-12-02 NOTE — Progress Notes (Signed)
To be dictated  Delray Alt, MD 12/02/2016, 12:43

## 2016-12-02 NOTE — Addendum Note (Signed)
Addended byDonnal Debar, Raymonde Hamblin on: 12/02/2016 12:44 PM     Modules accepted: Orders, SmartSet

## 2016-12-03 NOTE — Progress Notes (Signed)
PATIENT NAME: Wanatah NUMBER:  [id number]  DATE OF SERVICE: 12/02/2016  DATE OF BIRTH:  07-Jul-1956    CLINIC NOTE    SUBJECTIVE:  Ms. Borns is a pleasant 61 year old white female referral from [place], [place], over at the [facility].  Ms. Jagow is a 61 year old white female who has developed severe low back and left leg symptoms on October 11, 2016.  She relates it will radiate down the posterior leg.  She gets cramping and electric shocks down the leg involving her left foot and second toe.  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  She did also have a total hip replacement done on August 13, 2016, with Dr. Sharlene Motts but has been doing well from that.  She last worked in August 2016.  She is on naproxen and not had any recent imaging studies of her back.    PAST MEDICAL HISTORY:  Coronary artery disease and osteoarthritis of the left hip.    PAST SURGICAL HISTORY:  She has had left hip replacement, hernia repairs and hysterectomy.    CURRENT MEDICATIONS:  1. Claritin p.r.n.  2. Multivitamin daily.  3. Naproxen p.r.n.    ALLERGIES:  She is allergic to contrast dye and Stadol    SOCIAL HISTORY:  She last worked August 2016.  She does not smoke; former smoker.  She drinks occasional alcohol.    FAMILY HISTORY:  Positive back problems.  Sister with lung disease and heart arrhythmias.    REVIEW OF SYSTEMS:  As above.  Bowel and bladder function intact.  No fevers or chills.  Weight has been stable.  No numbness or tingling in the left leg.  She relates it has eased off a little and also gets occasional right groin pain.  She believes she may have hip arthritis on the right too.    PHYSICAL EXAMINATION:  Her height is 5 feet 6 inches, weight is 91 kg blood pressure 136/84, pulse 73 and regular.  She is alert, oriented, pleasant and cooperative.  Lungs:  Clear.  Heart:   Regular rate and rhythm.  Abdomen:  Bowel sounds positive, soft and nontender.  She has negative straight leg raising.  Her strength is grossly intact in lower extremities 4+ to 5/5, reflexes 1 to 2/4, numbness related and pain down the left second toe.    IMPRESSION:  Lumbosacral strain and lumbar radiculopathy left leg.  She has a history of lumbar disk disease at L4-L5 and L5-S1.  She relates no acute injuries.  She just woke up with it on Christmas Eve.  At this time, we will begin some physical therapy.  She declined any steroid packs.  We will obtain a lumbar spine MRI and place her on Skelaxin 800 mg twice a day p.r.n. for back spasms.  She relates that she is feeling a little bit better and she is hopeful the symptoms are improving, but still with times of rather severe pain radiating down the left leg.  We will see her back in a few months.  She will call and give [place] a report on how she is doing with her therapy and on her medicines.  We will call her after we get the lumbar spine MRI.        Delray Alt, MD  Assistant Professor   [facility] Department of Orthopaedics  DD:  12/02/2016 13:52:57  DT:  12/03/2016 11:21:53 DW  D#:  [id number]

## 2016-12-15 ENCOUNTER — Ambulatory Visit (INDEPENDENT_AMBULATORY_CARE_PROVIDER_SITE_OTHER): Payer: Self-pay | Admitting: PHYSICAL MEDICINE AND REHABILITATION

## 2016-12-15 NOTE — Telephone Encounter (Signed)
Peer to Peer  Received: [facility] Delray Alt Service; P Ortho Test Schedulers     Cc: Mena Pauls; [place], [place]; [place], Hal Morales, Georgette Evorn Gong, Tammy S                [facility] is requesting additional information for the Lumbar MRI. They need details of failure to respond to 6wks conservative tx in the last 3 months. You may call [phone] opt 3 and make reference to tr # [id number] to speak with a physician reviewer.     Please reply to all when responding to this message.       Phone call to patient, left voice mail requesting she call us with her "response to PT" and to have her current PT notes faxed to spine center.

## 2016-12-15 NOTE — Telephone Encounter (Signed)
Received phone call from patient stating she received a phone call to inform her the lumbar MRI has been denied.  She states she starts PT on 12/18/16.  Instructed her to call the RN line once she has completed 8-10 visits.  Patient verbalizes understanding and denies any questions.

## 2016-12-17 ENCOUNTER — Other Ambulatory Visit (HOSPITAL_BASED_OUTPATIENT_CLINIC_OR_DEPARTMENT_OTHER): Payer: Self-pay

## 2016-12-18 ENCOUNTER — Ambulatory Visit (INDEPENDENT_AMBULATORY_CARE_PROVIDER_SITE_OTHER): Payer: 59 | Admitting: Rehabilitative and Restorative Service Providers"

## 2016-12-18 DIAGNOSIS — M545 Low back pain, unspecified: Secondary | ICD-10-CM

## 2016-12-18 NOTE — Progress Notes (Signed)
Patient was seen 10:30 - 11:30    Current history    Low back pain radiating to leg, calf, with pins and needles second toe. Pain started Oct 11 2016  NPRS 8/10  Leg gives way when walking  Pain is always there but there are periods of relief.  Improves with walking or changing position  Worsens with prolonged sitting or any position. Out of bed to the bathroom is very painful.    Past history  Hip pain 2013 and back pain  Back pain 2015 managed with prednisone but gave modes disturbance  Chiropractic treatment    Medical history    THR Oct 2017  Aortic valve stenosis  No hypertension  No diabetes  No cancer  No osteoprosis  No inflammatory joint disease  No caution to exercise  No GI problems  No Fever  No Unexplained weight loss/gain  No urinary or bowel problems    Physical examination  Left foot intoeing when walking  Left arm diminished swing  Normal heel/toe walking  Increased lordosis  Negative lumbar static landmarks  Normal side bendings  Painful buttock with extension  Painful flexion/ repeated flexion produces their symptoms they complain of  Trendelenburg positive on the left.    Sitting  Normal DTRs except left ankle jerk diminished  Normal myotomes of LE, right hip flexors are a little weaker than left  Thoracic rotation limited and produce pain in the hips    Supine  Negative sit up test put it produces the patients pain  Normal FABER/Patricks  SLR produces pain at 50 - 60 degrees but pain disappear after that. Painful arc.  Hip flexion to chest normal    Prone  PA painful at L5  PA stiff thoracic  FNT normal      Treatment  PA mobilization L5  PA mobilization thoracic  Extension exercises on elbows follow by 3 set x 10 reps press ups  Sidelying grade 5 mobiliztioin  Patient education  Home exercsie program include extension exercises    Patient had immediate pain relief after treatment.

## 2016-12-22 ENCOUNTER — Other Ambulatory Visit (HOSPITAL_BASED_OUTPATIENT_CLINIC_OR_DEPARTMENT_OTHER): Payer: Self-pay

## 2016-12-22 ENCOUNTER — Encounter (HOSPITAL_BASED_OUTPATIENT_CLINIC_OR_DEPARTMENT_OTHER): Payer: Self-pay | Admitting: Orthopaedic Surgery

## 2016-12-22 ENCOUNTER — Ambulatory Visit (INDEPENDENT_AMBULATORY_CARE_PROVIDER_SITE_OTHER): Payer: 59 | Admitting: PHYSICAL MEDICINE AND REHABILITATION

## 2016-12-22 ENCOUNTER — Ambulatory Visit (INDEPENDENT_AMBULATORY_CARE_PROVIDER_SITE_OTHER): Payer: 59 | Admitting: Rehabilitative and Restorative Service Providers"

## 2016-12-22 ENCOUNTER — Ambulatory Visit: Payer: 59 | Attending: Orthopaedic Surgery | Admitting: Orthopaedic Surgery

## 2016-12-22 VITALS — BP 163/88 | HR 88 | Temp 97.9°F | Ht 64.88 in | Wt 200.6 lb

## 2016-12-22 DIAGNOSIS — M545 Low back pain, unspecified: Secondary | ICD-10-CM

## 2016-12-22 DIAGNOSIS — Z96642 Presence of left artificial hip joint: Principal | ICD-10-CM | POA: Insufficient documentation

## 2016-12-22 NOTE — Progress Notes (Signed)
[facility]   DEPARTMENT OF ORTHOPAEDICS    SUBJECTIVE  [name] is a 61 y.o. female who returns to clinic today post operatively from a left total hip arthroplasty which was performed on 08/13/16. She is here today for a wound check. She had a couple areas along the incision where there was some punctate lesions. She says that these areas have healed up nicely since we last saw her. She is very happy with the results of hip replacement. She does state that she has been having sciatic pain since the beginning of February. She was seen in the spine center and was given a muscle relaxer and was started in PT. She is scheduled to follow up with them again in May. She also complains of some right groin pain but she is not interested in pursing any type of treatment for the hip until the sciatic pain diminishes.    REVIEW OF SYSTEMS  General: Negative for fevers or night sweats  Cardiovascular: Negative for chest pain  Respiratory: Negative for shortness of breath  Gastrointestinal: Negative for digestive problems  Genitourinary: Negative for urinary problems  Musculoskeletal: Negative for hip pain, negative for knee pain     All other systems reviewed and negative    PHYSICAL EXAMINATION   BP (!) 163/88   Pulse 88   Temp 36.6 C (97.9 F) (Thermal Scan)    Ht 1.648 m (5' 4.88")   Wt 91 kg (200 lb 9.9 oz)   BMI 33.51 kg/m2  GEN:   NAD  CV:   PT and DP pulses present bilaterally.  PULM:   Normal respiratory effort.    MS:   Smooth and painless left hip ROM.  NEURO:   Alert and oriented to person, place and time.    PSYCHOSOCIAL:   Pleasant.  Normal affect.    WOUND/INCISION:   Wound margins intact and well healed. No signs of erythema, ecchymosis, hypergranulation, hernia or drainage.       RADIOGRAPHS  Radiographs were reviewed at this visit.      ASSESSMENT     ICD-10-CM    1. 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 She is doing well post operatively and her wound is well healed. We encouraged her to continue follow-up with the spine center. She was reminded about antibiotic prophylaxis before future procedures. We encouraged the patient to continue to stay as active as possible. The patient was told that she will have a follow up visit scheduled in the [facility] in October 2018 at which time left hip x-rays will be taken. Orders were placed in the system. The patient was instructed to call with any new or concerning symptoms. All of the patient's questions were answered and she agreed to this plan.    [name] Ricchio, PA-C 12/22/2016, 13:40     Late entry for 12/22/16. I personally saw and evaluated the patient. See mid-level's note for additional details. My findings/participation are well healed wound.    [name], MD        Cc:   Derek Jack, APRN,FNP-BC  [place] [id number]

## 2016-12-23 ENCOUNTER — Other Ambulatory Visit (HOSPITAL_BASED_OUTPATIENT_CLINIC_OR_DEPARTMENT_OTHER): Payer: Self-pay | Admitting: Radiology

## 2016-12-23 ENCOUNTER — Encounter (HOSPITAL_BASED_OUTPATIENT_CLINIC_OR_DEPARTMENT_OTHER): Payer: 59 | Admitting: Orthopaedic Surgery

## 2016-12-29 ENCOUNTER — Encounter (INDEPENDENT_AMBULATORY_CARE_PROVIDER_SITE_OTHER): Payer: 59 | Admitting: Rehabilitative and Restorative Service Providers"

## 2017-01-01 ENCOUNTER — Ambulatory Visit (INDEPENDENT_AMBULATORY_CARE_PROVIDER_SITE_OTHER): Payer: 59 | Admitting: Rehabilitative and Restorative Service Providers"

## 2017-01-01 DIAGNOSIS — M545 Low back pain, unspecified: Secondary | ICD-10-CM

## 2017-01-01 NOTE — Progress Notes (Signed)
Patient was seen 11:30 - 12:00 PM    Patient complain of pain in the buttock and thigh. Occasional lateral foot pins and needles since last night.    Treatment  PA mobilization L5  PA mobilization thoracic  Hand heel rock exercise x 10 rps  Extension exercises on elbows with PA mobilization of L5 follow by 1 set x 10 reps press ups  Sidelying grade 5 mobiliztioin  Bridging exercise x 10 rps  Home exercsie program include extension exer

## 2017-01-01 NOTE — Progress Notes (Signed)
Patient was seen 2:30 - 3:00 PM    Patient complain of pain in the buttock and thigh.    Treatment  PA mobilization L5  PA mobilization thoracic  Extension exercises on elbows follow by 3 set x 10 reps press ups  Sidelying grade 5 mobiliztioin  Patient education  Home exercsie program include extension exercises

## 2017-01-05 ENCOUNTER — Encounter (INDEPENDENT_AMBULATORY_CARE_PROVIDER_SITE_OTHER): Payer: 59 | Admitting: Rehabilitative and Restorative Service Providers"

## 2017-01-07 ENCOUNTER — Telehealth (HOSPITAL_BASED_OUTPATIENT_CLINIC_OR_DEPARTMENT_OTHER): Payer: Self-pay | Admitting: Orthopaedic Surgery

## 2017-01-07 NOTE — Telephone Encounter (Signed)
Amoxicillin 2gms orally one hour prior to dental work x 2 refills called to [facility] per patient request

## 2017-01-08 ENCOUNTER — Ambulatory Visit (INDEPENDENT_AMBULATORY_CARE_PROVIDER_SITE_OTHER): Payer: 59 | Admitting: Rehabilitative and Restorative Service Providers"

## 2017-01-08 DIAGNOSIS — M545 Low back pain, unspecified: Secondary | ICD-10-CM

## 2017-01-14 NOTE — Progress Notes (Signed)
Patient was seen 12:00 - 12:30 PM    Patient complain of an acute exacerbation of symptoms pain is 7/10 on NPRS. and reaches the calf muscle on the left.  Normal toe and heel walking    Treatment    PA mobilization thoracic  Hand heel rock exercise x 10 rps  Extension exercises on elbows with PA mobilization of L5 follow by 1 set x 10 reps press ups  Sidelying grade 5 mobiliztioin (Lazyman's roll)

## 2017-01-19 ENCOUNTER — Ambulatory Visit (INDEPENDENT_AMBULATORY_CARE_PROVIDER_SITE_OTHER): Payer: 59 | Admitting: Rehabilitative and Restorative Service Providers"

## 2017-01-19 DIAGNOSIS — M79606 Pain in leg, unspecified: Principal | ICD-10-CM

## 2017-01-21 ENCOUNTER — Encounter (HOSPITAL_BASED_OUTPATIENT_CLINIC_OR_DEPARTMENT_OTHER): Payer: Self-pay

## 2017-01-21 ENCOUNTER — Ambulatory Visit (HOSPITAL_BASED_OUTPATIENT_CLINIC_OR_DEPARTMENT_OTHER): Payer: Self-pay | Admitting: Family

## 2017-01-21 DIAGNOSIS — Z1239 Encounter for other screening for malignant neoplasm of breast: Secondary | ICD-10-CM

## 2017-01-21 NOTE — Telephone Encounter (Signed)
Placed a mammogram order for pt at her request. Kerman Passey, RN

## 2017-01-21 NOTE — Telephone Encounter (Signed)
Screening mammo was placed. You pended diagnostic, if she needs diagnostic the order will need to be changed and she cannot have that here. Those are at [facility], APRN,FNP-BC

## 2017-01-21 NOTE — Telephone Encounter (Signed)
Regarding: MAMMO Request  ----- Message from [place] sent at 01/21/2017 10:17 AM EDT -----  Derek Jack, APRN,FNP-BC    Pt calling to see if she could get an order for a MAMMO at [facility] location.  She said her last one was in 55 in [place].  Confirmed the number above is a good number to contact pt on.    Thanks,    Benedetto Coons [facility]

## 2017-01-22 ENCOUNTER — Ambulatory Visit (INDEPENDENT_AMBULATORY_CARE_PROVIDER_SITE_OTHER): Payer: 59 | Admitting: Rehabilitative and Restorative Service Providers"

## 2017-01-22 DIAGNOSIS — M545 Low back pain, unspecified: Secondary | ICD-10-CM

## 2017-02-08 ENCOUNTER — Encounter (HOSPITAL_BASED_OUTPATIENT_CLINIC_OR_DEPARTMENT_OTHER): Payer: Self-pay

## 2017-02-08 ENCOUNTER — Ambulatory Visit
Admission: RE | Admit: 2017-02-08 | Discharge: 2017-02-08 | Disposition: A | Discharge status: Home / Self Care | Payer: 59 | Source: Ambulatory Visit | Attending: Family | Admitting: Family

## 2017-02-08 DIAGNOSIS — Z1231 Encounter for screening mammogram for malignant neoplasm of breast: Secondary | ICD-10-CM

## 2017-02-08 DIAGNOSIS — Z1239 Encounter for other screening for malignant neoplasm of breast: Secondary | ICD-10-CM

## 2017-02-09 ENCOUNTER — Encounter (INDEPENDENT_AMBULATORY_CARE_PROVIDER_SITE_OTHER): Payer: 59 | Admitting: Rehabilitative and Restorative Service Providers"

## 2017-02-12 ENCOUNTER — Ambulatory Visit (INDEPENDENT_AMBULATORY_CARE_PROVIDER_SITE_OTHER): Payer: 59 | Admitting: Rehabilitative and Restorative Service Providers"

## 2017-02-12 DIAGNOSIS — M545 Low back pain, unspecified: Secondary | ICD-10-CM

## 2017-02-15 ENCOUNTER — Other Ambulatory Visit (HOSPITAL_COMMUNITY): Payer: Self-pay

## 2017-02-15 NOTE — Progress Notes (Signed)
Patient was seen 12:30 - 1:00 PM    Acute exacerbation continues in the morning.  Normal toe and heel walking    Treatment    Neural mobilization of sciatic nerve  Neural mobilization exercises at home  Extension exercises on elbows with PA mobilization of L71follow by 1set x 10 reps press ups  Squeez buttock exercise 10 reps x 2 sets

## 2017-02-16 ENCOUNTER — Ambulatory Visit: Payer: 59 | Attending: Family | Admitting: Family

## 2017-02-16 ENCOUNTER — Encounter (HOSPITAL_BASED_OUTPATIENT_CLINIC_OR_DEPARTMENT_OTHER): Payer: Self-pay | Admitting: Family

## 2017-02-16 ENCOUNTER — Ambulatory Visit (INDEPENDENT_AMBULATORY_CARE_PROVIDER_SITE_OTHER): Payer: 59 | Admitting: Rehabilitative and Restorative Service Providers"

## 2017-02-16 VITALS — BP 125/76 | HR 82 | Temp 98.4°F | Ht 65.08 in | Wt 206.4 lb

## 2017-02-16 DIAGNOSIS — Z0000 Encounter for general adult medical examination without abnormal findings: Secondary | ICD-10-CM | POA: Insufficient documentation

## 2017-02-16 DIAGNOSIS — Z791 Long term (current) use of non-steroidal anti-inflammatories (NSAID): Secondary | ICD-10-CM | POA: Insufficient documentation

## 2017-02-16 DIAGNOSIS — Z79899 Other long term (current) drug therapy: Secondary | ICD-10-CM | POA: Insufficient documentation

## 2017-02-16 DIAGNOSIS — Z87891 Personal history of nicotine dependence: Secondary | ICD-10-CM | POA: Insufficient documentation

## 2017-02-16 DIAGNOSIS — M5432 Sciatica, left side: Secondary | ICD-10-CM | POA: Insufficient documentation

## 2017-02-16 DIAGNOSIS — M545 Low back pain, unspecified: Secondary | ICD-10-CM

## 2017-02-16 DIAGNOSIS — Z789 Other specified health status: Secondary | ICD-10-CM

## 2017-02-16 DIAGNOSIS — Z96642 Presence of left artificial hip joint: Secondary | ICD-10-CM | POA: Insufficient documentation

## 2017-02-16 LAB — POCT FECAL IMMUNOCHEMICAL TEST (FIT): FIT FECAL TEST: NEGATIVE

## 2017-02-16 MED ORDER — CYCLOBENZAPRINE 10 MG TABLET: 10 mg | Tab | Freq: Every evening | ORAL | 2 refills | 0 days | Status: DC | PRN

## 2017-02-16 MED ORDER — GABAPENTIN 100 MG CAPSULE: 200 mg | Cap | Freq: Two times a day (BID) | ORAL | 0 refills | 0 days | Status: DC

## 2017-02-16 NOTE — Progress Notes (Signed)
Normal toe and heel walking    Treatment    Neural mobilization of sciatic nerve from standing position with increasing the dose to 4 times/day  Neural mobilization exercises at home  Squeez buttock exercise 10 reps x 2 sets  Clam shell exercises 10 reps each side  Right Hip mobilization  Thoracic mobilization P - A

## 2017-02-16 NOTE — Progress Notes (Signed)
patient was seen 11:30 - 12:00    Patient reported pain over the weekend in the legs and had to take medications  Today pain completely disappeared from the leg and is focused on the lower back.    Physical examination shows a closing restriction pattern  Treatment:  Sidelying lumbar roll was used  Thoracic mobility PA  hip mobilization using mulligan 10 reps  Glute squeez with 8 sec hold x 10 reps  clam shell using theraband x 10 reps

## 2017-02-16 NOTE — Nursing Note (Signed)
FIT test received, test negative, pt informed Odis Hollingshead, LPN  05/27/8420, 03:12

## 2017-02-16 NOTE — Progress Notes (Signed)
Patient was seen 10:30 - 11    Normal toe and heel walking    Treatment    Neural mobilization of sciatic nerve  Neural mobilization exercises at home  Extension exercises on elbows with PA mobilization of L35follow by 1set x 10 reps press ups  Squeez buttock exercise 10 reps x 2 sets  Clam shell exercises 10 reps each side

## 2017-02-16 NOTE — Progress Notes (Signed)
SUBJECTIVE:   [name] is a 61 y.o. female  for presenting for her annual checkup.    Wants to discuss significant ADL limitation with sciatica. She had left hip replacement and felt well with recovery, then she developed left sided sciatica that causes her pain every day and decreases her activity- which her activity level is very high.   Saw Dr. Donnal Debar for this, he told her to do PT, which she is doing, but continue to have electrical pain every day.     Health Maintenance: Pending and Last Completed       Date Due Completion Date    Adult Tdap-Td (1 - Tdap) 10/28/1973 ---    Zoster vaccine 10/28/2014 ---    Influenza Vaccine (Season Ended) 06/19/2017 ---    Fecal Occult Blood/FIT Test: High Sensitivity 02/15/2018 02/15/2017    Depression Screening 02/16/2018 02/16/2017    Mammography 02/09/2019 02/08/2017        Lab Results   Component Value Date    CHOLESTEROL 181 01/20/2016    HDLCHOL 60 01/20/2016    LDLCHOL 104 (H) 01/20/2016    TRIG 87 01/20/2016      No results found for: GLUCOSEFAST   Lab Results   Component Value Date    HA1C 5.3 01/20/2016       Diet History: "moderately healthy" diet  in general  well balanced  varied diet rich in fruits and vegetables  Calcium intake:inadequate  Exercise History: 0 days per week    Outpatient Medications Prior to Visit:  ALPRAZolam (XANAX) 0.5 mg Oral Tablet Take one tablet by mouth 60 minutes prior to MRI, WILL NEED DRIVER. (Patient not taking: Reported on 02/16/2017)   loratadine (CLARITIN) 10 mg Oral Tablet Take 10 mg by mouth Once a day   MAGNESIUM ORAL Take by mouth   multivitamin Oral Tablet Take 1 Tab by mouth Once a day   naproxen (NAPROSYN) 500 mg Oral Tablet Take 1 Tab (500 mg total) by mouth Twice daily with food   metaxalone (SKELAXIN) 800 mg Oral Tablet Take 1 Tab (800 mg total) by mouth Twice per day as needed for Pain (Patient not taking: Reported on 02/16/2017)     No facility-administered medications prior to visit.    Tobacco History  reports that she has  quit smoking. She has a 35.00 pack-year smoking history. She has never used smokeless tobacco.    There is no immunization history on file for this patient.  family history includes Heart Disease in her mother; Hypertension in her father; Lung Cancer in her brother and father; No Known Problems in her sister and sister; Sudden Death no cause in her mother. There is no history of Blood Clots.    ROS:   Constitutional: negative for fevers, chills and weight loss  Eyes: negative for visual disturbance  Ears, nose, mouth, throat, and face: negative for hearing loss, earaches and hoarseness  Respiratory: negative for cough or dyspnea on exertion  Cardiovascular: negative for chest pressure/discomfort, palpitations, near-syncope, syncope  Gastrointestinal: negative for dysphagia, nausea, vomiting, diarrhea, constipation, abdominal pain and blood in bowel movements  Genitourinary:negative for frequency, dysuria, incontinence or hematuria.    Menstrual history: postmenopausal, no sexual concerns or vaginal irritation.   Integument/breast: negative for rash and changed mole  Hematologic/lymphatic: negative for lymphadenopathy  Musculoskeletal:negative for myalgias, arthralgias and muscle weakness  Neurological: negative for seizures and stroke  or TIA symptoms  Behvioral/Psych: negative for depression and anxiety  Endocrine: negative for diabetic symptoms including  polyuria, polydipsia and weight loss  Allergic/Immunologic: negative    OBJECTIVE:   Wt Readings from Last 3 Encounters:   02/16/17 93.6 kg (206 lb 5.6 oz)   12/22/16 91 kg (200 lb 9.9 oz)   12/02/16 91 kg (200 lb 9.9 oz)     BP Readings from Last 3 Encounters:   02/16/17 125/76   12/22/16 (!) 163/88   12/02/16 136/84     BP 125/76  Pulse 82  Temp 36.9 C (98.4 F) (Thermal Scan)   Ht 1.653 m (5' 5.08")  Wt 93.6 kg (206 lb 5.6 oz)  BMI 34.26 kg/m2   Body mass index is 34.26 kg/(m^2).   General appearance: alert, well appearing in no distress.  PERLA.  TMs,  mouth and pharynx are normal.  Neck supple without adenopathy or thyromegaly.  Lungs are clear, good air entry, no wheezes, rhonchi or rales. Heart: S1 and S2 normal, no murmurs or gallops, regular rate and rhythm.  No edema.  Abdomen soft without tenderness, guarding, mass or organomegaly.  Skin without worrisome lesions.  Reflexes are normal.  GYN exam deferred due to discomfort.    PHQ Questionnaire  Little interest or pleasure in doing things.: Not at all  Feeling down, depressed, or hopeless: Not at all  PHQ 2 Total: 0    ASSESSMENT & PLAN:     ICD-10-CM    1. Well adult exam Z00.00    2. Participant in health and wellness plan Z78.9 POCT Fecal Immunochemical (FIT)   3. Back pain with left-sided sciatica M54.32 gabapentin (NEURONTIN) 100 mg Oral Capsule     cyclobenzaprine (FLEXERIL) 10 mg Oral Tablet     Will start gabapentin and see if this helps her pain at all. Will start low and increase slowly. Start 200 mg BID and increase every 2 weeks. She did not have any improvement with skelaxin from Dr. Donnal Debar, will try flexeril at night time only.   BMI addressed: Advised on diet, weight loss, and exercise to reduce above normal BMI.  Patient was seen independently, co-signing physician on site if consultation was needed.    Derek Jack, APRN,FNP-BC  [facility] [place]  Operated by [facility]  [address]  [place] [address]  Dept: [phone]

## 2017-02-19 ENCOUNTER — Ambulatory Visit (INDEPENDENT_AMBULATORY_CARE_PROVIDER_SITE_OTHER): Payer: 59 | Admitting: Rehabilitative and Restorative Service Providers"

## 2017-02-19 ENCOUNTER — Encounter (INDEPENDENT_AMBULATORY_CARE_PROVIDER_SITE_OTHER): Payer: 59 | Admitting: Rehabilitative and Restorative Service Providers"

## 2017-02-19 DIAGNOSIS — M545 Low back pain, unspecified: Secondary | ICD-10-CM

## 2017-02-23 ENCOUNTER — Encounter (INDEPENDENT_AMBULATORY_CARE_PROVIDER_SITE_OTHER): Payer: 59 | Admitting: Rehabilitative and Restorative Service Providers"

## 2017-02-24 ENCOUNTER — Ambulatory Visit (INDEPENDENT_AMBULATORY_CARE_PROVIDER_SITE_OTHER): Payer: 59 | Admitting: Rehabilitative and Restorative Service Providers"

## 2017-02-24 DIAGNOSIS — M545 Low back pain, unspecified: Secondary | ICD-10-CM

## 2017-02-26 ENCOUNTER — Ambulatory Visit (INDEPENDENT_AMBULATORY_CARE_PROVIDER_SITE_OTHER): Payer: 59 | Admitting: Rehabilitative and Restorative Service Providers"

## 2017-02-26 DIAGNOSIS — M545 Low back pain, unspecified: Secondary | ICD-10-CM

## 2017-03-02 ENCOUNTER — Ambulatory Visit (INDEPENDENT_AMBULATORY_CARE_PROVIDER_SITE_OTHER): Payer: 59 | Admitting: Rehabilitative and Restorative Service Providers"

## 2017-03-02 DIAGNOSIS — M545 Low back pain, unspecified: Secondary | ICD-10-CM

## 2017-03-03 ENCOUNTER — Encounter (INDEPENDENT_AMBULATORY_CARE_PROVIDER_SITE_OTHER): Payer: Self-pay | Admitting: PHYSICAL MEDICINE AND REHABILITATION

## 2017-03-03 ENCOUNTER — Ambulatory Visit: Payer: 59 | Attending: PHYSICAL MEDICINE AND REHABILITATION | Admitting: PHYSICAL MEDICINE AND REHABILITATION

## 2017-03-03 DIAGNOSIS — M5136 Other intervertebral disc degeneration, lumbar region: Principal | ICD-10-CM | POA: Insufficient documentation

## 2017-03-03 NOTE — Progress Notes (Signed)
To be dictated  Delray Alt, MD 03/03/2017, 12:44

## 2017-03-03 NOTE — Addendum Note (Signed)
Addended byDonnal Debar, Schyler Counsell on: 03/03/2017 12:44 PM     Modules accepted: Miquel Dunn

## 2017-03-03 NOTE — Progress Notes (Signed)
To be dictated  Delray Alt, MD 03/03/2017, 12:40

## 2017-03-04 NOTE — Progress Notes (Signed)
PATIENT NAME: [place] NUMBER:  [id number]  DATE OF SERVICE: 03/03/2017  DATE OF BIRTH:  Jan 02, 1956    CLINIC NOTE    SUBJECTIVE:  Ms. Trettin presents for a followup visit.  She relates that she is just now starting to feel better.  She relates still having some intermittent electric type symptoms from the left buttocks down to her left ankle.  She relates pain is a 1-3 but she relates it is much improved than before she started therapy.  She has gone through at least 13 visits.  She takes Flexeril at night and Naprosyn during the day.  She did not get approval for her MRI.    OBJECTIVE:  She is alert and oriented.  Lungs:  Clear.  Heart:  Regular rate and rhythm.  Neurologically unchanged, still with some numbness down into her left second toe.  Reflexes 1-2/4, strength 4+ to 5/5.    IMPRESSION:  Lumbar degenerative disk disease.  Less radicular symptoms down her left leg.    PLAN:  She seems to be progressing with her physical therapy.  We will continue that.  She will let us know if she fails to continue to improve.  I will see her back in 3 months.        Delray Alt, MD  Assistant Professor   [place] [facility]ed[facility]              DD:  03/03/2017 12:47:45  DT:  03/04/2017 07:14:43 MD  D#:  [id number]

## 2017-03-05 ENCOUNTER — Encounter (INDEPENDENT_AMBULATORY_CARE_PROVIDER_SITE_OTHER): Payer: Self-pay | Admitting: Rehabilitative and Restorative Service Providers"

## 2017-03-19 ENCOUNTER — Ambulatory Visit (INDEPENDENT_AMBULATORY_CARE_PROVIDER_SITE_OTHER): Payer: 59 | Admitting: Rehabilitative and Restorative Service Providers"

## 2017-03-19 DIAGNOSIS — M545 Low back pain: Secondary | ICD-10-CM

## 2017-03-19 NOTE — Progress Notes (Signed)
DAILY NOTE   Patient: Chen[place] Luddy   Date: June/10/2016      Problem:  Mita Vallo presents to her session without leg or calf pain since Wednesday May/23/2018. She only has minor low back discomfort.   Subjective: Marygrace Sandoval presents with a report of 0-1/10 pain in the lower back.  She describes this as a discomfort/ache and denies any other symptoms. She points to bilateral low back at L5 level as point of ache.    Objective: Treatment initiated at 11:00 am.    Interventions: Session initiated with manual therapy.  [name] received P-A lumbar mobilization at L3-5 while in prone with pillows under stomach.  She also received manual traction of lumbar spine while standing and hugging the edge of the table. Also, right hip distraction to improve mobility of the joint and reduce stress on the lumbar spine. Therapeutic exercises were performed as follows in chart:      Exercise  Reps/Sets  Notes/Cues    Knee to chest  X10  Verbal cues to remain passive and not activate the hip flexors    Clam shells from supine with resistance applied by therapist  X10  Verbal and tactile cues needed for facilitation of contraction       Home exercise program reviewed with patient to include gluteal squeez, clam shell, knee to chest x 10 each set and complete 1 - 2 sets each day. Also revision of neural mobilization techniques and advice on how to manage exacerbation.        Pain rating at end of session: 0/10.    Manual Therapy: 12 minutes   Therapeutic Exercise: 15 minutes   Total skilled minutes: 28 minutes   Total treatment minutes: 28 minutes      Assessment:  [name] progressed well with her exercises. She demonstrates improved thoracolumar mobility and glue med strength (5/5).  She is able to perform Trendelenburg. Her pain at the end of the session is 0/10 on 10 point scale.  She is now interested in attending pool exercise sessions and signed up for gym at a senior center   Plan: Since  symptoms have in control, Lisel Siegrist will be seen once a week for two weeks.      Diona Browner, PT, MS, PhD, COMT

## 2017-03-19 NOTE — Progress Notes (Signed)
DAILY NOTE   [name]   Date: May/10/2016      Problem:  [name] presents to her session with CC of acute pain in the morning that results in calf pain and tingling in the foot.   Subjective: [name] presents with a report of 7-8/10 pain in the leg.  She describes this as a sharp/burning pain in the posterolateral left thigh, left calf that is associated with paresthesia in lateral aspect of the left foot. Currently, lower back symptoms are minimal.    Objective: Treatment initiated at 2:00 pm.    Normal heel and toe walking.  Sidebending to the left increases the pain in the leg at endrange. Extension increases the pain in back and leg at endrange. Flexion produce pain the lower back only at endrange.   Interventions: Session initiated P-A mobilization of thoracolumbar region. Right hip mobilization using [name] belt grade III - IV. Then, neural mobilization of the sciatic nerve in the left leg in standing with increased dose (i.e. increasing the tension on the nerve via increasing the trunk flexion). Soft tissue mobilization could not be attempted because of pain Therapeutic exercises were performed as follows in chart:      Exercise  Reps/Sets  Notes/Cues    Neural mobilization exercises (slump stretch) X10 x 3 sets    Buttock squeeze exercises  X10 Verbal and cues needed for facilitation of contraction       Home exercise program reviewed with patient to include gluteal squeez, clam shell, knee to chest x 10 each for 1 - 2 sets each day. Also revision of neural mobilization techniques and increasing the number of sets to 10x2setx 4 day. Also, advice on how to manage exacerbation.        Pain rating at end of session: 4-5/10.    Manual Therapy: 25 minutes   Therapeutic Exercise: 10 minutes   Total skilled minutes: 35 minutes   Total treatment minutes: 35 minutes      Assessment:  [name] has been have multiple exacerbations with bouts of asymptomatic periods in  between. She continues to with exercise program as suggested and is committed to doing the exercise program.   Plan:  [name] will be seen twice a week for 4 more weeks. This will change depending on how she progressed with the exercises. Neural mobilization will continue to be part of the treatment. Additionally, soft tissue mobilization will be attempted next session     Diona Browner, PT, MS, PhD, COMT

## 2017-03-19 NOTE — Progress Notes (Signed)
DAILY NOTE   [name]   Date: April/03/2017      Problem:  [name] presents to her session with CC of acute exacerbation of pain in the morning that results in calf pain and tingling in the foot.   Subjective: [name] presents with a report of 7/10 pain in the leg.  She describes this as a sharp/burning pain in the posterolateral left thigh, left calf that is associated with paresthesia in lateral aspect of the left foot. Currently, lower back symptoms are minimal.    Objective: Treatment initiated at 10:30 am.    Interventions: Session initiated with extension exercise on by resting on elbow with P-A mobilization followed by x reps of press up.  [name] received neural mobilization and glideing of the sciatic nerve on the left leg.  Therapeutic exercises were performed as follows in chart:      Exercise  Reps/Sets  Notes/Cues    Neural mobilization exercises (slump stretch) X10 x 2 sets    Buttock squeeze exercises  X10 x 2 sets Verbal and cues needed for facilitation of contraction       Home exercise program reviewed with patient to include gluteal squeez, clam shell, knee to chest x 10 each set and complete 1 - 2 sets each day. Also revision of neural mobilization techniques and advice on how to manage exacerbation.        Pain rating at end of session: 4/10.    Manual Therapy: 20 minutes   Therapeutic Exercise: 10 minutes   Total skilled minutes: 30 minutes   Total treatment minutes: 30 minutes      Assessment:  [name] has been have multiple exacerbations with bouts of asymptomatic periods in between. She demonstrates improved thoracolumar mobility and is committed to doing the exercise program.  She is sometimes frustrated but feels reassured after therapy sessions.   Plan:  [name] will be seen twice a week for 4 more weeks. This will change depending on how she progressed with the exercises. Neural mobilization will continue to be part of  the treatment. Additionally, soft tissue mobilization will be attempted next session     Diona Browner, PT, MS, PhD, COMT

## 2017-03-24 ENCOUNTER — Ambulatory Visit (INDEPENDENT_AMBULATORY_CARE_PROVIDER_SITE_OTHER): Payer: 59 | Admitting: Rehabilitative and Restorative Service Providers"

## 2017-03-24 DIAGNOSIS — M545 Low back pain, unspecified: Secondary | ICD-10-CM

## 2017-03-24 NOTE — Progress Notes (Signed)
DAILY NOTE  [name]  Date: June/03/2017    Problem:  Lakina Mcintire presents to her session with slight left thigh and calf pain. She also had some right groin pain. She went to the gym on Monday Jun/04 and exercised. She used the recumbent bike for 10 minutes and walked for 10 minutes.  She also has minor discomfort in her low back.  Subjective: Prapti Grussing presents with a report of 3/10 pain in the lower back, leg and calf.  She describes this as a discomfort/ache and denies any other symptoms. She points to bilateral low back at L5 level as point of ache.   Objective: Treatment initiated at 11:00 am.   Interventions: Session initiated with manual therapy.  Cameshia Gasca received sidelying lumbar roll mobilization grade V. She also received right hip manual therapy including [name] belt distraction, and long axis distraction.  She also received flexion distraction on the lower back. Also, transverse friction massage at the hamstring insertion in the ischal tuberosity.  Therapeutic exercises were performed as follows in chart:    Exercise Reps/Sets Notes/Cues   Abdominal curls X10 Verbal cues to remain passive and not activate the hip flexors   Clam shells from supine with resistance applied by therapist X10 x 8 sec hold Verbal and tactile cues needed for facilitation of contraction   Should flexion with abdominal brace in supine position X 10    Isometric gluteal squeeze X 10 x 8 sec hold      Home exercise program reviewed with patient to include gluteal squeez, clam shell, knee to chest x 10 each set and complete 1 - 2 sets each day. Also advice of doing neural mobilization techniques to manage exacerbation.      Pain rating at end of session: 0-1/10.   Manual Therapy: 20 minutes  Therapeutic Exercise: 15 minutes  Total skilled minutes: 35 minutes  Total treatment minutes: 35 minutes    Assessment:  [name] progressed well with her exercises. This time she has pain a  little bit more than the last time. She did not go to the pool as she mentioned last time because she found out it will be next week. She went to the gym and exercised.  Her pain is there but at low level. She has 0 - 1 pain out of 10 at the end of the session.   Plan: Since symptoms are still control, Nancylee Gaines will be seen once a week for two to three weeks.    Diona Browner, PT, MS, PhD, COMT

## 2017-03-26 ENCOUNTER — Encounter (INDEPENDENT_AMBULATORY_CARE_PROVIDER_SITE_OTHER): Payer: Self-pay | Admitting: Rehabilitative and Restorative Service Providers"

## 2017-03-31 ENCOUNTER — Ambulatory Visit (INDEPENDENT_AMBULATORY_CARE_PROVIDER_SITE_OTHER): Payer: 59 | Admitting: Rehabilitative and Restorative Service Providers"

## 2017-03-31 DIAGNOSIS — M545 Low back pain, unspecified: Secondary | ICD-10-CM

## 2017-04-01 NOTE — Progress Notes (Signed)
DAILY NOTE  [name]  Date: June/14/2018    Problem:  Khalessi Blough presents to her session with slight left thigh and calf pain. She also had some right groin pain. She went to the pool on Tuesday Jun/05. She experienced slight groin pain.  She went to the gym on Friday Jun/08 and exercised. She used the recumbent bike for 12 minutes and walked for 12 minutes.  She indicated that after the gym she was a little sore with some pain in her left thigh and calf.  She also mentionedSaturday morning she had severe pain that lasted almost 12 hours (started 3 am and reduced at 3 pm).  Sunday, Monday and Tuesday the symptoms were there but not so severe.  Subjective: Arrie Borrelli presents with a current report of 0-1/10 pain in the lower back, leg and calf.  She also mentions that front thigh feels has abnormal sensation.  Objective: Treatment initiated at 11:00 am.   Interventions: Session initiated with manual therapy.  [name] received thoracolumbar mobility glides. She also received flexion distraction on the lower back. Also, transverse friction massage at the hamstring insertion in the ischal tuberosity. She also received massage on the lateral side of her left lower leg (peroneal muscles). She also received right hip manual therapy including [name] belt distraction.  She was put on the stationary bike for 15 minutes. During minute 12 she started to report groin pain.  So a [name] glide was performed using a belt as she was cycling. This helped reduced her groin symptoms. Therapeutic exercises were not performed in this session.  However, she was advised to continue her home exercise program and the neural mobilization techniques to manage exacerbation.      Pain rating at end of session: 0-1/10.   Manual Therapy: 20 minutes  Therapeutic Exercise: 0 minutes  Total skilled minutes: 20 minutes  Total unskilled minutes: 15 minutes  Total treatment minutes: 35 minutes    Assessment:   [name] progressed well with her exercises. This time she has pain similar to the previous session. She did go to the pool and did not have a major problem. She went to the gym and exercised.  Her pain is still there but at low level. She has 0 - 1 pain out of 10 at the end of the session. One major exacerbation occurred since May 23.  Plan: Since symptoms are still control, Tarnesha Ulloa will be seen once a week for two to three weeks. The symptom exacerbation suggest that she needs to be seen for another 3 weeks.    Diona Browner, PT, MS, PhD, COMT

## 2017-04-05 ENCOUNTER — Ambulatory Visit (INDEPENDENT_AMBULATORY_CARE_PROVIDER_SITE_OTHER): Payer: 59 | Admitting: Rehabilitative and Restorative Service Providers"

## 2017-04-05 DIAGNOSIS — M545 Low back pain, unspecified: Secondary | ICD-10-CM

## 2017-04-05 NOTE — Progress Notes (Signed)
DAILY NOTE  [name]  Date: June/18/2018    Problem:  [name] presents to her session with slight left thigh but more calf pain. She reported she has been doing well since her last session on Wed Jun/13. She also has some right groin pain.  Subjective: [name] presents with a report of 1 - 2/10 pain in the thigh, but leg is about 4 - 5/10 and associated with tingling around the ankle.  She describes some discomfort/ache in the lower back and denies any other symptoms  Objective: Treatment initiated at 11:30 am.   Interventions: Session initiated with neural mobilization of the left leg in the sitting position. The technique was performed passively while the patient was slouched and looking down. One time the foot was dorsiflexed and the other time the foot was dorsiflexed and inverted.  She also received right hip manual therapy including [name] belt distraction, and long axis distraction.  She also received flexion distraction on the lower back. Therapeutic exercises were performed as follows in chart:    Exercise Reps/Sets Notes/Cues   Abdominal curls X30 Verbal cues to remain passive and not activate the hip flexors   Clam shells from supine with resistance applied by theraband (green) X20  Verbal and tactile cues needed for facilitation of contraction   hip flexion with abdominal brace in supine position X 10 each leg    Isometric gluteal bridging with theraband X 20      Home exercise program reviewed with patient to include gluteal bridging, clam shell, knee to chest x 10 each set and complete 1 - 2 sets each day.    Pain rating at end of session: 0/10.   Manual Therapy: 20 minutes  Therapeutic Exercise: 20 minutes  Total skilled minutes: 40 minutes  Total treatment minutes: 40 minutes    Assessment:  [name] progressed well with her exercises. There has not been a major exacerbation since last time she was seen. This time she has pain a little bit more in the  calf than the thigh. She has 0 pain out of 10 at the end of the session.   Plan: Since symptoms are still control, [name] will be seen once a week for two to three weeks.    Diona Browner, PT, MS, PhD, COMT

## 2017-04-13 ENCOUNTER — Telehealth (HOSPITAL_BASED_OUTPATIENT_CLINIC_OR_DEPARTMENT_OTHER): Payer: Self-pay | Admitting: Orthopaedic Surgery

## 2017-04-13 NOTE — Telephone Encounter (Signed)
Amoxicillin 2gms orally one hour prior to dental work x 2 refills called to [facility] per patient request [phone]

## 2017-04-14 ENCOUNTER — Ambulatory Visit (INDEPENDENT_AMBULATORY_CARE_PROVIDER_SITE_OTHER): Payer: 59 | Admitting: Rehabilitative and Restorative Service Providers"

## 2017-04-14 DIAGNOSIS — M545 Low back pain, unspecified: Secondary | ICD-10-CM

## 2017-04-14 NOTE — Progress Notes (Signed)
DAILY NOTE  [name]  Date: June/27/2018    Problem:  [name] presents to her session with no current pain. However, she described that she woke up at 4:30 AM this morning with pain in the buttock, thigh and calf on the left leg. She had to do the exercises to relief the pain. This pain has been waking her up over the past 4 days.   She did water aerobic yesterday.  Subjective: [name] presents with a report of 0/10 pain currently.  However, this morning the pain was 6/10 in the buttock, 2/10 in the thigh and 4 in the calf of the left leg. She denies any other symptoms. She points to bilateral low back at L5 level as point of ache when she bends forward. Patient was asked about potential factors that led to these exacerbations, such as stresses during the day, however, patient denies any of that.  Objective: Treatment initiated at 11:30 am.   Interventions: Session initiated with discussion about how to manage night exacerbation. Positioning and relaxation training was prescribed. Manual therapy including tissue mobilization of the lumbar spine, right hip mulligan mobilization spinal flexion distraction, and transverse friction massage at the hamstring insertion in the ischal tuberosity.  Additionally, soft tissue mobilization of the hamstring muscles and neural mobilization of the left legs was done. Therapeutic exercises were performed as follows in chart.     Breathing exercises from supine position was done x 15 repetitions. Recumbent bike was used fpr 15 minutes    Home exercise program reviewed with patient to include gluteal squeez, clam shell, knee to chest x 10 each set and complete 1 - 2 sets each day.     Pain rating at end of session: 0/10.   Manual Therapy: 20 minutes  Therapeutic Exercise: 10 minutes  Non-skilled minutes: 15 minutes  Total skilled minutes: 30 minutes  Total treatment minutes: 45 minutes    Assessment:  [name] is having night  exacerbations that wakes her up from sleep. The exacerbation are short lived (2 - 3 hours). The exacerbations to tend to quiet during the day and are only felt while she is a sleep. Today, she presented without pain, and only concerned with exacerbations.  Relaxation strategies and positioning have been prescribed for her. She is progressing well with her pain and doing her exercises and going to the gym.   Plan: symptoms are still in control, only management of exacerbation is still required. [name] will continue to be seen once a week for two to three weeks.    Diona Browner, PT, MS, PhD, COMT

## 2017-04-22 ENCOUNTER — Ambulatory Visit (INDEPENDENT_AMBULATORY_CARE_PROVIDER_SITE_OTHER): Payer: 59 | Admitting: Rehabilitative and Restorative Service Providers"

## 2017-04-22 DIAGNOSIS — M545 Low back pain, unspecified: Secondary | ICD-10-CM

## 2017-04-23 NOTE — Progress Notes (Signed)
DAILY NOTE  [name]  Date: July/02/2017    Problem:  [name] presents to her session with pain she describes as slight in the left buttock, with left ankle pain, and funny feelings around the the left toes. However, she described that she has followed the advice about better sleeping positioning using a cushion under her waist, which seems to be very helpful. Ever since using it, she has not been waking up with severe pain. It is only slight. She is doing her home exercises as necessary and has been going to the gym and pool aerobic class. She describes that despite some pain she continues to exercise. She mentioned that she is now much better than when she was first seen two months ago.  Subjective: [name] presents with a report of 1-2/10 pain currently in the buttock and ankle. She denies any other symptoms. She points to bilateral low back at L5 level as point of ache when she bends forward.   Objective: Treatment initiated at 1:30 pm.   Interventions: Session initiated with discussion about hypervigilence. Patient was educated about pain theory and advised not to pay attention to pain. Positioning and relaxation training was encourage. Manual therapy including knee to chest exercises done using a towel (20 times each leg) from supine. Also, tissue mobilization of the lumbar spine, spinal flexion distraction, and transverse friction massage at the hamstring insertion in the ischal tuberosity.  Additionally, soft tissue mobilization of the calf and peroneal muscles of the left leg, in addition to sole of the left foot mobilization.     Recumbent bike was used fpr 15 minutes       Pain rating at end of session: 0/10.   Manual Therapy: 20 minutes  Therapeutic Exercise: 5 minutes  Non-skilled minutes: 15 minutes  Total skilled minutes: 25 minutes  Total treatment minutes: 40 minutes    Assessment:  [name] exacebation seem to be manageable now with the cushion  advice. She has been comfortable over the past 4 days with not very severe pain when she wakes up. Today, she presented with slight pain.  Relaxation strategies and positioning have been encouraged for her. She is progressing well with her pain and doing her exercises and going to the gym.   Plan: symptoms are still in control, so [name] will continue to be seen once a week for another two weeks.    Diona Browner, PT, MS, PhD, COMT

## 2017-04-29 ENCOUNTER — Ambulatory Visit (INDEPENDENT_AMBULATORY_CARE_PROVIDER_SITE_OTHER): Payer: 59 | Admitting: Rehabilitative and Restorative Service Providers"

## 2017-04-29 DIAGNOSIS — M545 Low back pain, unspecified: Secondary | ICD-10-CM

## 2017-05-05 ENCOUNTER — Ambulatory Visit (INDEPENDENT_AMBULATORY_CARE_PROVIDER_SITE_OTHER): Payer: 59 | Admitting: Rehabilitative and Restorative Service Providers"

## 2017-05-05 DIAGNOSIS — M545 Low back pain, unspecified: Secondary | ICD-10-CM

## 2017-05-13 ENCOUNTER — Encounter (INDEPENDENT_AMBULATORY_CARE_PROVIDER_SITE_OTHER): Payer: Self-pay | Admitting: Rehabilitative and Restorative Service Providers"

## 2017-05-14 ENCOUNTER — Ambulatory Visit (INDEPENDENT_AMBULATORY_CARE_PROVIDER_SITE_OTHER): Payer: 59 | Admitting: Rehabilitative and Restorative Service Providers"

## 2017-05-14 DIAGNOSIS — M545 Low back pain, unspecified: Secondary | ICD-10-CM

## 2017-05-16 NOTE — Progress Notes (Signed)
DAILY NOTE  [name]  Date: July/09/2017      Problem:  [name] presents to her session with pain she describes as slight in the left buttock, with left ankle pain. The funny feeling around the toes disappeared from the last session July 05. However, she is continuing with advice to sleep with a cushion which seems helpful.   Subjective: [name] presents with a report of 1/10 pain currently in the buttock and ankle. She denies any other symptoms.   Objective:   Normal toe and heel walking. Spinal ROM full in flexion, sidebending and restricted but not painful extension. Treatment initiated at 1:30 pm.   Interventions: Treatment initiated at 1:30 pm. Manual therapy including knee to chest exercises done using a towel (20 times each leg) from supine. Also, tissue mobilization of the lumbar spine, spinal flexion distraction, and transverse friction massage at the hamstring insertion in the ischial tuberosity.  Additionally, soft tissue mobilization of the calf and peroneal muscles of the left leg, in addition to sole of the left foot mobilization.       Recumbent bike was used for 15 minutes          Pain rating at end of session: 0/10.   Manual Therapy: 20 minutes  Therapeutic Exercise: 5 minutes  Non-skilled minutes: 15 minutes  Total skilled minutes: 25 minutes  Total treatment minutes: 40 minutes      Assessment:  [name] did not have very severe pain when she wakes up. Today, she presented with slight pain.  Relaxation strategies and positioning have been encouraged for her. She is progressing well with her pain and doing her exercises and going to the gym.   Plan: symptoms are still in control, so [name] will continue to be seen once a week for another two weeks.      Diona Browner, PT, MS, PhD, COMT

## 2017-05-16 NOTE — Progress Notes (Signed)
DAILY NOTE   Patient: Alexianna Nachreiner   Date: May/01/2017      Problem:  Tamura Lasky presents to her session with CC of acute pain in the morning that results in calf pain and tingling in the foot.   Subjective: Carlina Derks presents with a report of 4/10 pain in the leg.  She describes this as a sharp/burning pain in the posterolateral left thigh, Currently, calf pain is absent and lower back symptoms are minimal.    Objective: Treatment initiated at 10:30 pm.    Normal heel and toe walking.  Sidebending to the left increases the pain in the posterolateral left thigh at endrange. Extension increases the pain in back and posteriolateral left thigh at endrange. Flexion produce pain the lower back only at endrange.   Interventions: Continued similar to the previous session. Session initiated P-A mobilization of thoracolumbar region. Right hip mobilization using [name] belt grade III - IV. Then, neural mobilization of the sciatic nerve in the left leg in standing with increased dose (i.e. increasing the tension on the nerve via increasing the trunk flexion).  Soft tissue mobilization of the hamstring, calf, and peroneal muscles of the left leg was attempted.   Therapeutic exercises were performed as follows in chart:      Exercise  Reps/Sets  Notes/Cues    Neural mobilization exercises (slump stretch) X10 x 3 sets     Buttock squeeze exercises  X10 Verbal and cues needed for facilitation of contraction       Home exercise program reviewed with patient to include gluteal squeez, clam shell, knee to chest x 10 each for 1 - 2 sets each day. Also revision of neural mobilization techniques and increasing the number of sets to 10x2setx 4 day.  Also, advice on how to manage exacerbation.        Pain rating at end of session: 2/10.    Manual Therapy: 25 minutes   Therapeutic Exercise: 10 minutes   Total skilled minutes: 35 minutes   Total treatment minutes: 35 minutes      Assessment:  Zailah Zagami has been  having multiple exacerbations with bouts of asymptomatic periods in between. She continues with exercise program as suggested and is committed to doing the exercise program.    Plan:  Edyn Popoca will be seen twice a week for 4 more weeks. This will change depending on how she progressed with the exercises. Neural mobilization will continue to be part of the treatment.      Diona Browner, PT, MS, PhD, COMT

## 2017-05-16 NOTE — Progress Notes (Signed)
DAILY NOTE   Patient: Ragan Duhon   Date: May/15/2018      Problem:  Jemeka Wagler presents to her session with CC of acute pain in the posterolateral thigh of the left leg in the morning.   Subjective: Twana Wileman presents with a report of 0-1/10 pain in the leg.  She describes this as a pain in the posterolateral left thigh. However, she had not had an exacerbation since she was last seen on 05/11. She had slight increase pain but it was manageable over the weekend  Objective: Treatment initiated at 1:30 pm.    Normal heel and toe walking.  Sidebending to the left increases the pain in the posterolateral left thigh at endrange. Extension increases did not bother as much. Flexion produces no pain and is full.   Interventions: Continued similar to the previous session. Session initiated P-A mobilization of thoracolumbar region. Right hip mobilization using [name] belt grade III - IV. Therapeutic exercises were performed as follows in chart:      Exercise  Reps/Sets  Notes/Cues    Abdominal Curls 10 x 3 sets     Buttock squeeze exercises  X10 Verbal and cues needed for facilitation of contraction    Knee to chest X 10    Standing hip abduction X10    Hand heel rock X10       Home exercise program reviewed with patient as described above.     Pain rating at end of session: 0/10.    Manual Therapy: 15 minutes   Therapeutic Exercise: 20 minutes   Total skilled minutes: 35 minutes   Total treatment minutes: 35 minutes      Assessment:  Taquila Leys has been having multiple exacerbations with bouts of asymptomatic periods in between. Currently she is not complaining of much pain nor had severe exacerbations. She continues with exercise program as suggested and is committed to doing the exercise program.    Plan:  Kynslee Baham will be seen twice a week for 2 more weeks. This will change depending on how she progressed with the exercises. Neural mobilization will be prescribed as home exercise.      Diona Browner, PT, MS, PhD, COMT

## 2017-05-16 NOTE — Progress Notes (Signed)
DAILY NOTE  [name]  Date: July/18/2018      Problem:  [name] presents to her session with pain she describes as slight in the left buttock, with left ankle pain. She denies any other symptoms   Subjective: [name] presents with a report of 0-1/10 pain currently in the buttock and posteriolateral thigh.   Objective:   Spinal ROM full in flexion, sidebending and restricted but not painful extension. Treatment initiated at 11:00 pm.   Interventions:. Tissue mobilization of the lumbar spine, spinal flexion distraction, and transverse friction massage at the hamstring insertion in the ischial tuberosity.  [name] belt was used for the right hip groin pain.            Pain rating at end of session: 0/10.   Manual Therapy: 20 minutes  Therapeutic Exercise:10 minutes  Non-skilled minutes: 0 minutes  Total skilled minutes: 30 minutes  Total treatment minutes: 30 minutes      Assessment:  [name] did not have very severe pain when she wakes up. Today, she presented with slight pain.  Relaxation strategies and positioning have been encouraged for her. She is progressing well with her pain and doing her exercises and going to the gym.   Plan: symptoms are still in control, so [name] will continue to be seen once a week for another two weeks.      Diona Browner, PT, MS, PhD, COMT

## 2017-05-16 NOTE — Progress Notes (Signed)
DAILY NOTE   [name]   Date: May/06/2017      Problem:  [name] presents to her session with CC of acute pain in the posterolateral thigh of the left leg in the morning.   Subjective: [name] presents with a report of 3/10 pain in the leg.  She describes this as a pain in the posterolateral left thigh.  Objective: Treatment initiated at 1:00 pm.    Normal heel and toe walking.  Sidebending to the left increases the pain in the posterolateral left thigh at endrange. Extension increases the pain in back and posteriolateral left thigh at endrange. Flexion produce pain in the lower back only at endrange.   Interventions: Continued similar to the previous session. Session initiated P-A mobilization of thoracolumbar region. Right hip mobilization using [name] belt grade III - IV. Then, neural mobilization of the sciatic nerve in the left leg in standing with increased dose (i.e. increasing the tension on the nerve via increasing the trunk flexion).  Soft tissue mobilization of the hamstring, calf, and peroneal muscles of the left leg was attempted.   Therapeutic exercises were performed as follows in chart:      Exercise  Reps/Sets  Notes/Cues    Neural mobilization exercises (slump stretch) X10 x 3 sets     Buttock squeeze exercises  X10 Verbal and cues needed for facilitation of contraction       Home exercise program reviewed with patient to include gluteal squeez, clam shell, knee to chest x 10 each for 1 - 2 sets each day. Also revision of neural mobilization techniques and increasing the number of sets to 10x2setx 4 day.  Also, advice on how to manage exacerbation.        Pain rating at end of session: 2/10.    Manual Therapy: 25 minutes   Therapeutic Exercise: 10 minutes   Total skilled minutes: 35 minutes   Total treatment minutes: 35 minutes      Assessment:  [name] has been having multiple exacerbations with bouts of asymptomatic periods in between. She  continues with exercise program as suggested and is committed to doing the exercise program.    Plan:  [name] will be seen twice a week for 4 more weeks. This will change depending on how she progressed with the exercises. Neural mobilization will continue to be part of the treatment.      Diona Browner, PT, MS, PhD, COMT

## 2017-05-16 NOTE — Progress Notes (Signed)
DAILY NOTE   Patient: Dartha Rozzell   Date: May/08/2017      Problem:  Sagan Wurzel presents to her session with CC of acute pain in the posterolateral thigh of the left leg in the morning.   Subjective: Jayliani Wanner presents with a report of 0-1/10 pain in the leg.  She describes this as a pain in the posterolateral left thigh. However, she had not had an exacerbation since she was last seen.  Objective: Treatment initiated at 9:00 am.    Normal heel and toe walking.  Sidebending to the left increases the pain in the posterolateral left thigh at endrange. Extension increases the pain in back and posteriolateral left thigh at endrange. Flexion produces no pain and is full.   Interventions: Continued similar to the previous session. Session initiated P-A mobilization of thoracolumbar region. Right hip mobilization using [name] belt grade III - IV. Therapeutic exercises were performed as follows in chart:      Exercise  Reps/Sets  Notes/Cues    Abdominal Curls 10 x 3 sets     Buttock squeeze exercises  X10 Verbal and cues needed for facilitation of contraction    Knee to chest X 10    Standing hip abduction X10       Home exercise program reviewed with patient to include gluteal squeez, clam shell, knee to chest x 10 each for 1 - 2 sets each day. Also revision of neural mobilization techniques.     Pain rating at end of session: 0/10.    Manual Therapy: 25 minutes   Therapeutic Exercise: 20 minutes   Total skilled minutes: 45 minutes   Total treatment minutes: 45 minutes      Assessment:  Daja Shuping has been having multiple exacerbations with bouts of asymptomatic periods in between. Currently she is not complaining of much pain. She continues with exercise program as suggested and is committed to doing the exercise program.    Plan:  Ardath Lepak will be seen twice a week for 3 more weeks. This will change depending on how she progressed with the exercises. Neural mobilization will be  prescribed as home exercise.     Diona Browner, PT, MS, PhD, COMT

## 2017-05-16 NOTE — Progress Notes (Signed)
DAILY NOTE  [name]  Date: July/27/2018      Problem:  Camy Leder presents to her session with pain she describes as severe in the left buttock, posterolateral thigh and calf muscle. She denies any other symptoms   Subjective: Jaquelin Meaney presents with a report of 4/10 pain currently in the buttock and posteriolateral thigh and calf muscle   Objective:   Spinal ROM full in flexion, sidebending is painful on both sides but full. Extension is painful at end range and restricted.   Interventions:. Treatment was initiated at 2:30. Neural mobilization from slump sitting followed by soft tissue mobilization of the left calf, peroneal and posterior thigh. Tissue mobilization of the lumbar spine, spinal flexion distraction, and transverse friction massage at the hamstring insertion in the ischial tuberosity.    Recumbent bike was used for 15            Pain rating at end of session: 2/10.   Manual Therapy: 20 minutes  Therapeutic Exercise:10 minutes  Non-skilled minutes: 15 minutes  Total skilled minutes: 30 minutes  Total treatment minutes: 45 minutes      Assessment:  Adiva had an exacerbation today shown as increased left buttock, thigh and calf pain. This exacerbation is likely after she went for one day trip. She probably overused the soft tissues. However, it is likely that this exacerbation will lessen over the next few days   Plan: Daveena Elmore will continue to be seen once a week for another two weeks.      Diona Browner, PT, MS, PhD, COMT

## 2017-05-20 ENCOUNTER — Ambulatory Visit (INDEPENDENT_AMBULATORY_CARE_PROVIDER_SITE_OTHER): Payer: 59 | Admitting: Rehabilitative and Restorative Service Providers"

## 2017-05-20 DIAGNOSIS — M545 Low back pain, unspecified: Secondary | ICD-10-CM

## 2017-05-20 NOTE — Progress Notes (Signed)
DAILY NOTE  [name]  Date: Aug/11/2016    Problem:  [name] presents to her session with left thigh and calf pain.  She indicated she gets better with Tai Chi.  This morning had an intense pain that lessened as the day went by. In addition to the thigh and calf pain, she has tingling distal part of the left lower leg. She denies any other symptoms.  Subjective: [name] presents with a report of 3/10 pain in the left lower leg and calf.  She describes this as a discomfort/ache associated with tingling sensation. This morning this pain was 7/10. Currently she is trying foot orthosis to correct for her leg length discrepancy  Objective: Treatment initiated at 1:30 pm.   Interventions: Session initiated with neural mobilization of the left leg.  Then soft tissue mobilization insertion in the ischal tuberosity as well as the greater trochanter. Education about importance of motor control exercises and using Tai Chi to manage exacerbation.  Also recumbent bike was used for 15 minutes (intensity level 2/8)    Home exercise program reviewed with patient to include gluteal squeez, clam shell, knee to chest x 10 each set and complete 1 - 2 sets each day. Also advice of doing neural mobilization techniques to manage exacerbation.      Pain rating at end of session: 1/10.   Manual Therapy: 20 minutes  nonskilled Exercise: 15 minutes  Total skilled minutes: 20 minutes  Total treatment minutes: 35 minutes    Assessment:  . This time [name] had an exacerbation. Her pain is still under control but it is alittle insense than usual. She left the session feeling better.   Plan: Since symptoms are still control, [name] will be seen once a week until the end of August    Diona Browner, PT, MS, PhD, COMT

## 2017-05-26 ENCOUNTER — Ambulatory Visit (INDEPENDENT_AMBULATORY_CARE_PROVIDER_SITE_OTHER): Payer: 59 | Admitting: Rehabilitative and Restorative Service Providers"

## 2017-05-26 DIAGNOSIS — M545 Low back pain, unspecified: Secondary | ICD-10-CM

## 2017-05-26 NOTE — Progress Notes (Signed)
DAILY NOTE  [name]  Date: Aug/05/2017    Problem:  [name] presents to her session with slight left thigh and calf pain. She also had some right groin pain. However, she describes her pain to have been very severe over the past weekend. There was very severe right groin pain and very severe low back and left leg pain. This happened after a home emergency situation involving water leakage. This caused her to move up and down stairs a lot and work entire day to get the basement cleaned. After taking care of the situation, she went to bed that saturday night noticing the right hip aching more than usual. That night she work up at 2 with severe right hip pain, low back pain and left leg sciatica.  She was only able to sleep for 3 hours. However, pain improved over the next two days. Additionally, she mentions stressful family situation.  Subjective: [name] presents with a current report of 2/10 pain in the lower back, left leg and calf. Over the weekend it was 10+ as she described in email.  There also left buttock pain and tailbone pain, but all was rated 2/10.  She has a little discomfort in her left calf tendon. She denies any other symptoms.   Objective: Treatment initiated at 12:00 pm.   Interventions: Session initiated with manual therapy.  [name] received right hip manual therapy including [name] belt distraction, and long axis distraction.  [name] also received lumbar flexion distraction from a bend over position. Also, transverse friction massage at the hamstring insertion in the ischal tuberosity.  Therapeutic exercises were performed as follows in chart:    Exercise Reps/Sets Notes/Cues   Breathing exercise for relaxation 10 reps Verbal cues   Abdominal curls X10 Verbal cues to remain passive and not activate the hip flexors    4 pound ankle weight hip abduction from supine X10  Verbal and tactile cues needed for facilitation of contraction   Active SLR  exercises X 10    Active gluteal exercises X 10       Home exercise program reviewed with patient to include gluteal squeez, clam shell, knee to chest x 10 each set and complete 1 - 2 sets each day. Also advice of doing neural mobilization techniques to manage exacerbation.      Pain rating at end of session: 1/10.   Manual Therapy: 20 minutes  Therapeutic Exercise: 15 minutes  Total skilled minutes: 35 minutes  Total nonskilled minutes: 10 minutes  Total treatment minutes: 45 minutes    Assessment:  [name] has a very severe exacerbation over the weekend due to stressful family situation and home emergency. This time she has pain a lot more than the usual. She has to cancel her vacation due to pain. However, her pain settled quickly (within one day) and she is able to conduct her ADLs with slight pain. The situation of home emergency showed that [name] needs to focus more on endurance exercises and maintain an adequate level of aerobic capacity. Her pain is there but at low level. She has 0 - 1 pain out of 10 at the end of the session.   Plan: Since symptoms can still be return to normal status, [name] will be seen every other week for exercises sessions.    [name], PT, MS, PhD, COMT

## 2017-06-02 ENCOUNTER — Encounter (INDEPENDENT_AMBULATORY_CARE_PROVIDER_SITE_OTHER): Payer: Self-pay | Admitting: Rehabilitative and Restorative Service Providers"

## 2017-06-03 ENCOUNTER — Ambulatory Visit (INDEPENDENT_AMBULATORY_CARE_PROVIDER_SITE_OTHER): Payer: 59 | Admitting: Rehabilitative and Restorative Service Providers"

## 2017-06-03 ENCOUNTER — Encounter (INDEPENDENT_AMBULATORY_CARE_PROVIDER_SITE_OTHER): Payer: Self-pay | Admitting: Rehabilitative and Restorative Service Providers"

## 2017-06-03 DIAGNOSIS — M545 Low back pain, unspecified: Secondary | ICD-10-CM

## 2017-06-03 NOTE — Progress Notes (Signed)
DAILY NOTE  [name]  Date: Aug/16/2018    Problem:  [name] presents to her session with no pain at all. She has been feeling great over the weekned. She only had some right groin pain.   Subjective: [name] presents with a current report of 0/10 pain in the lower back, left leg and calf. She complain of right hip groin pain 1/10  Objective: Treatment initiated at 11:00 am.   Interventions: Session initiated with manual therapy.  [name] received right hip manual therapy including [name] belt distraction.  Therapeutic exercises were performed as follows in chart:    Exercise Reps/Sets Notes/Cues   Abdominal curls X20 Verbal cues to remain passive and not activate the hip flexors    4 pound ankle weight hip abduction from supine X10  Verbal and tactile cues needed for facilitation of contraction   Isometric hip abduction from supine using Belt X 20    Side lying hip abduction X 10    Side propping X 10    Bridging exercises X 20       Home exercise program reviewed with patient to include gluteal squeez, clam shell, knee to chest x 10 each set and complete 1 - 2 sets each day. Also advice of doing neural mobilization techniques to manage exacerbation.      Pain rating at end of session: 1/10.   Manual Therapy: 10 minutes  Therapeutic Exercise: 25 minutes  Total skilled minutes: 35 minutes  Total nonskilled minutes: 0 minutes  Total treatment minutes: 35 minutes    Assessment:  [name] has no pain today. She was symptom free at end of session. [name] needs to focus more on endurance exercises and maintain an adequate level of aerobic capacity. She has 0 pain out of 10 at the end of the session.   Plan: Since symptoms can still be return to normal status, [name] will be seen every other week for exercises sessions.    [name], PT, MS, PhD, COMT

## 2017-06-09 ENCOUNTER — Encounter (INDEPENDENT_AMBULATORY_CARE_PROVIDER_SITE_OTHER): Payer: Self-pay | Admitting: PHYSICAL MEDICINE AND REHABILITATION

## 2017-06-09 ENCOUNTER — Ambulatory Visit: Payer: 59 | Attending: PHYSICAL MEDICINE AND REHABILITATION | Admitting: PHYSICAL MEDICINE AND REHABILITATION

## 2017-06-09 DIAGNOSIS — M1611 Unilateral primary osteoarthritis, right hip: Secondary | ICD-10-CM | POA: Insufficient documentation

## 2017-06-09 DIAGNOSIS — M5136 Other intervertebral disc degeneration, lumbar region: Secondary | ICD-10-CM

## 2017-06-09 DIAGNOSIS — R29898 Other symptoms and signs involving the musculoskeletal system: Secondary | ICD-10-CM

## 2017-06-09 NOTE — Progress Notes (Signed)
To be dictated  Delray Alt, MD 06/09/2017, 12:25

## 2017-06-10 NOTE — Progress Notes (Signed)
PATIENT NAME: [place] NUMBER:  [id number]  DATE OF SERVICE: 06/09/2017  DATE OF BIRTH:  1956-04-26    CLINIC NOTE    SUBJECTIVE:  Ms. Gilles presents for a followup visit.  She has continued her therapy.  She relates it is helping.  She overall appears to have around 30 visits.  She has less left leg pain.  She relates now pain is primarily nonradiating low back pain and varies 0 to 3.  She has takes Naprosyn p.r.n. at night and Tylenol during the day.  She relates it does help.  She has continued to have some intermittent right hip pain from her hip OA.  She relates it is tolerable.  She relates her left leg symptoms are doing much better.  She does not believe the pain is severe enough that she would need an MRI for potential referral to the pain clinic.  She relates she is not interested in any injections in her lumbar spine.    OBJECTIVE:  She is alert and oriented.  Lungs:  Clear.  Heart:  Regular rate and rhythm.  Neurologic:  Nonfocal.  Strength is still 4+ to 5/5.    IMPRESSION:  Lumbar degenerative disk disease, radicular symptoms essentially resolved.    PLAN:  We will advance her home exercise program.  We will see her back in the spring of 2019, sooner if needed.        Delray Alt, MD  Assistant Professor   [place]              DD:  06/09/2017 12:38:18  DT:  06/10/2017 02:58:04 No Name  D#:  [id number]

## 2017-06-17 ENCOUNTER — Ambulatory Visit (INDEPENDENT_AMBULATORY_CARE_PROVIDER_SITE_OTHER): Payer: 59 | Admitting: Rehabilitative and Restorative Service Providers"

## 2017-06-17 DIAGNOSIS — M545 Low back pain, unspecified: Secondary | ICD-10-CM

## 2017-06-22 ENCOUNTER — Ambulatory Visit (HOSPITAL_BASED_OUTPATIENT_CLINIC_OR_DEPARTMENT_OTHER): Payer: 59 | Admitting: Family

## 2017-06-28 NOTE — Progress Notes (Signed)
DAILY NOTE  [name]  Date: Aug/30/2018    Problem:  [name] presents to her session with slight pain. She has been feeling great over the last 10 days. She woke up with pain but it eased in the next few hours. She is enjoying her new job.  Subjective: [name] presents with a current report of 0-1/10 pain in the lower back, left leg and calf. She complain of right hip groin pain 1/10. Last night her pain was 4/10. Over the past 10 days her pain was 1-2/10 on average.  Objective: Treatment initiated at 11:00 am.   Interventions: Session initiated with manual therapy.  [name] received right hip manual therapy including [name] belt distraction, Hip distraction, low back distraction and hamstring insertion soft tissue mobilization.    [name] indicated she would like to use the recumbent bike so this is what we did.    Pain rating at end of session: 0/10.   Manual Therapy: 25 minutes  Therapeutic Exercise: 0 minutes  Total skilled minutes: 25 minutes  Total nonskilled minutes: 0 minutes  Total treatment minutes: 25 minutes    Assessment:  [name] presented with very minimal pain today. She was symptom free at end of session. [name] needs to focus more on endurance exercises and maintain an adequate level of aerobic capacity. She has 0 pain out of 10 at the end of the session.   Plan: Since symptoms can still be return to normal status, [name] will be seen every other week for exercises sessions.    [name], PT, MS, PhD, COMT

## 2017-07-07 ENCOUNTER — Encounter (INDEPENDENT_AMBULATORY_CARE_PROVIDER_SITE_OTHER): Payer: Self-pay | Admitting: Rehabilitative and Restorative Service Providers"

## 2017-07-07 ENCOUNTER — Ambulatory Visit (INDEPENDENT_AMBULATORY_CARE_PROVIDER_SITE_OTHER): Payer: Self-pay | Admitting: Family

## 2017-07-08 ENCOUNTER — Encounter (HOSPITAL_BASED_OUTPATIENT_CLINIC_OR_DEPARTMENT_OTHER): Payer: Self-pay | Admitting: Family

## 2017-07-08 ENCOUNTER — Ambulatory Visit: Payer: 59 | Attending: Family | Admitting: Family

## 2017-07-08 VITALS — BP 142/80 | Temp 97.9°F | Ht 66.0 in | Wt 205.5 lb

## 2017-07-08 DIAGNOSIS — J0100 Acute maxillary sinusitis, unspecified: Secondary | ICD-10-CM | POA: Insufficient documentation

## 2017-07-08 MED ORDER — FLUTICASONE PROPIONATE 50 MCG/ACTUATION NASAL SPRAY,SUSPENSION
1.0000 | Freq: Every day | NASAL | 1 refills | Status: DC
Start: 2017-07-08 — End: 2017-10-07

## 2017-07-08 MED ORDER — AMOXICILLIN 875 MG-POTASSIUM CLAVULANATE 125 MG TABLET: 1 | Tab | Freq: Two times a day (BID) | ORAL | 0 refills | 0 days | Status: AC

## 2017-07-08 MED ORDER — BENZONATATE 200 MG CAPSULE: 200 mg | Cap | Freq: Three times a day (TID) | ORAL | 0 refills | 0 days | Status: DC | PRN

## 2017-07-08 NOTE — Progress Notes (Signed)
SUBJECTIVE:   [name] is a 61 y.o. female who complains of runny nose, congestion and cough  for 1 month.  She denies a history of sweats, chills, fevers, wheezing, shortness of breath, chest pain, nausea and vomiting.  She does not have a history of chronic lung disease/ asthma.  Pt reports taking OTC Mucinex-D and tylenol with minimal symptom relief.    Outpatient Prescriptions Marked as Taking for the 07/08/17 encounter (Office Visit) with Derek Jack, APRN,FNP-BC   Medication Sig   . acetaminophen (TYLENOL) 500 mg Oral Tablet Take 500 mg by mouth Every 4 hours as needed for Pain   . amoxicillin-pot clavulanate (AUGMENTIN) 875-125 mg Oral Tablet Take 1 Tab by mouth Every 12 hours for 10 days   . Benzonatate (TESSALON) 200 mg Oral Capsule Take 1 Cap (200 mg total) by mouth Three times a day as needed for Cough   . cholecalciferol, vitamin D3, 1,000 unit Oral Tablet Take 1,000 Units by mouth Once a day   . fluticasone (FLONASE) 50 mcg/actuation Nasal Spray, Suspension 1 Spray by Each Nostril route Once a day   . loratadine (CLARITIN) 10 mg Oral Tablet Take 10 mg by mouth Once a day   . multivitamin Oral Tablet Take 1 Tab by mouth Once a day       ROS per HPI.  Review of Systems   Constitutional: Negative for chills and fever.   HENT: Positive for congestion, postnasal drip and sinus pressure.    Respiratory: Positive for cough.      OBJECTIVE:  BP (!) 142/80  Temp 36.6 C (97.9 F) (Tympanic)   Ht 1.676 m (5\' 6" )  Wt 93.2 kg (205 lb 7.5 oz)  BMI 33.16 kg/m2  General appearance: alert, cooperative, no distress, appears stated age  Exam: left TM fluid noted and post nasal drip noted, Normal chest wall and respirations. Clear to auscultation..    ASSESSMENT & PLAN:    ICD-10-CM    1. Acute non-recurrent maxillary sinusitis J01.00 fluticasone (FLONASE) 50 mcg/actuation Nasal Spray, Suspension     Benzonatate (TESSALON) 200 mg Oral Capsule     amoxicillin-pot clavulanate (AUGMENTIN) 875-125 mg  Oral Tablet     Will start patient on Augmentin x 10 days, as well as Flonase 1-2 times per day and Tessalon. Encouraged rest and increasing water intake.  Pt agreeable to above plan.  Advised to call back directly if there are further questions, or if these symptoms fail to improve as anticipated or worsen.     Patient was seen independently, co-signing physician on site if consultation was needed.    Derek Jack, APRN,FNP-BC  [facility], [place]  Operated by [facility]  [address]  [place] [address]  Dept: [phone]

## 2017-07-14 ENCOUNTER — Encounter (INDEPENDENT_AMBULATORY_CARE_PROVIDER_SITE_OTHER): Payer: Self-pay | Admitting: Rehabilitative and Restorative Service Providers"

## 2017-07-21 ENCOUNTER — Ambulatory Visit (INDEPENDENT_AMBULATORY_CARE_PROVIDER_SITE_OTHER): Payer: 59 | Admitting: Rehabilitative and Restorative Service Providers"

## 2017-07-21 ENCOUNTER — Encounter (HOSPITAL_BASED_OUTPATIENT_CLINIC_OR_DEPARTMENT_OTHER): Payer: Self-pay | Admitting: Family

## 2017-07-21 DIAGNOSIS — M545 Low back pain, unspecified: Secondary | ICD-10-CM

## 2017-07-27 NOTE — Progress Notes (Signed)
DAILY NOTE  [name]  Date: Oct/12/2016    Problem:  [name] presents to her session without leg or calf pain since she was seen 4 weeks ago. She only has minor low back discomfort.  Subjective: [name] presents with a report of 0-1/10 pain in the lower back.  She describes this as a discomfort/ache and denies any other symptoms. She points to bilateral low back at L5 level as point of ache. She complained more of right groin pain (3/10)  Objective: Treatment initiated at 2:00 pm.   Interventions: Session initiated with manual therapy.  [name] received P-A lumbar mobilization at L3-5 while in prone with pillows under stomach.  She also received manual traction of lumbar spine while standing and hugging the edge of the table. Also, right hip distraction to improve mobility of the joint and reduce stress on the lumbar spine. Therapeutic exercises were performed as follows in chart:    Exercise Reps/Sets Notes/Cues   Knee to chest X10 Verbal cues to remain passive and not activate the hip flexors     Home exercise program reviewed with patient to include gluteal squeez, clam shell, knee to chest x 10 each set and complete 1 - 2 sets each day. Also revision of neural mobilization techniques and advice on how to manage exacerbation.    Pain rating at end of session: 0/10.   Manual Therapy: 12 minutes  Therapeutic Exercise: 15 minutes  Total skilled minutes: 28 minutes  Total treatment minutes: 28 minutes    Assessment:  [name] progressed well with her exercises. She report one minor exacerbation in past 4 week rating pain of at 4/10. 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   Plan: Since symptoms have in control, [name] will be seen once a every 4 weeks    Diona Browner, PT, MS, PhD, COMT

## 2017-08-10 ENCOUNTER — Ambulatory Visit (HOSPITAL_BASED_OUTPATIENT_CLINIC_OR_DEPARTMENT_OTHER): Payer: Self-pay | Admitting: Orthopaedic Surgery

## 2017-08-11 ENCOUNTER — Encounter (HOSPITAL_BASED_OUTPATIENT_CLINIC_OR_DEPARTMENT_OTHER): Payer: 59 | Admitting: Orthopaedic Surgery

## 2017-08-11 ENCOUNTER — Ambulatory Visit (HOSPITAL_BASED_OUTPATIENT_CLINIC_OR_DEPARTMENT_OTHER): Payer: 59 | Admitting: Orthopaedic Surgery

## 2017-08-11 ENCOUNTER — Ambulatory Visit
Admission: RE | Admit: 2017-08-11 | Discharge: 2017-08-11 | Disposition: A | Discharge status: Home / Self Care | Payer: 59 | Source: Ambulatory Visit | Attending: Orthopaedic Surgery | Admitting: Orthopaedic Surgery

## 2017-08-11 ENCOUNTER — Other Ambulatory Visit (HOSPITAL_BASED_OUTPATIENT_CLINIC_OR_DEPARTMENT_OTHER): Payer: Self-pay | Admitting: Radiology

## 2017-08-11 ENCOUNTER — Ambulatory Visit (HOSPITAL_BASED_OUTPATIENT_CLINIC_OR_DEPARTMENT_OTHER): Payer: 59

## 2017-08-11 ENCOUNTER — Ambulatory Visit (INDEPENDENT_AMBULATORY_CARE_PROVIDER_SITE_OTHER): Payer: 59 | Admitting: Rehabilitative and Restorative Service Providers"

## 2017-08-11 ENCOUNTER — Encounter (HOSPITAL_BASED_OUTPATIENT_CLINIC_OR_DEPARTMENT_OTHER): Payer: Self-pay | Admitting: Orthopaedic Surgery

## 2017-08-11 ENCOUNTER — Other Ambulatory Visit (HOSPITAL_BASED_OUTPATIENT_CLINIC_OR_DEPARTMENT_OTHER): Payer: Self-pay

## 2017-08-11 VITALS — BP 184/93 | HR 77 | Temp 96.8°F | Ht 65.16 in | Wt 206.8 lb

## 2017-08-11 DIAGNOSIS — Z471 Aftercare following joint replacement surgery: Secondary | ICD-10-CM

## 2017-08-11 DIAGNOSIS — M5430 Sciatica, unspecified side: Secondary | ICD-10-CM | POA: Insufficient documentation

## 2017-08-11 DIAGNOSIS — Z96642 Presence of left artificial hip joint: Secondary | ICD-10-CM | POA: Insufficient documentation

## 2017-08-11 DIAGNOSIS — M545 Low back pain, unspecified: Secondary | ICD-10-CM

## 2017-08-11 DIAGNOSIS — Z09 Encounter for follow-up examination after completed treatment for conditions other than malignant neoplasm: Secondary | ICD-10-CM | POA: Insufficient documentation

## 2017-08-11 DIAGNOSIS — M1611 Unilateral primary osteoarthritis, right hip: Secondary | ICD-10-CM

## 2017-08-11 NOTE — Progress Notes (Signed)
DAILY NOTE  [name]  Date: Oct/24/2018     Problem:  [name] presents to her session without leg or calf pain since she was seen 3 weeks ago. She only has minor low back discomfort.  Subjective: [name] presents with a report of 0/10 pain in the lower back.  She describes this as a discomfort/ache and denies any other symptoms. She points to bilateral low back at L5 level as point of ache. She complained more of right groin pain (2/10). Over the past 3 weeks she only had 1 - 2 severe episodes of leg pain rated (6/10) but was manageable.  Objective: Treatment initiated at 1:00 pm.   Interventions: Session initiated with manual therapy.  [name] received P-A lumbar mobilization at L1-5 while in prone.  She also received manual traction of lumbar spine while standing and hugging the edge of the table. Also, right hip distraction to improve mobility of the joint and reduce stress on the lumbar spine. Therapeutic exercises were performed as follows in chart:     Exercise Reps/Sets Notes/Cues   Knee to chest X3 Verbal cues to remain passive and not activate the hip flexors      Home exercise program reviewed with patient to include gluteal squeez, clam shell, knee to chest x 10 each set and complete 1 - 2 sets each day. Also revision of neural mobilization techniques and advice on how to manage exacerbation.     Pain rating at end of session: 0/10.   Manual Therapy: 12 minutes  Therapeutic Exercise: 15 minutes  Total skilled minutes: 28 minutes  Total treatment minutes: 28 minutes     Assessment:  [name] progressed well with her exercises. She report one to two minor exacerbation in past 3 week rating pain of at 6/10. 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   Plan: Since symptoms have been in control, [name] will be seen once a every 4 weeks     Diona Browner, PT, MS, PhD, COMT

## 2017-08-11 NOTE — Progress Notes (Signed)
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    DATE OF SURGERY:  August 13, 2016    SUBJECTIVE:  This is a 61 year old female who is now 1 year out from surgery.  Overall, seems to be well satisfied with the results of her left total hip arthroplasty.  She had episodes sciatica beginning in February of this year, which she was placed into a physical therapy program, which has significantly improved her symptoms.  The patient notes that her right leg feels longer than her left leg and contributes the sciatic into this.  She is being managed by nonoperative spine specialist, Dr. Donnal Debar.    PHYSICAL EXAMINATION:  Today, the patient is alert and oriented, in no acute distress.  Mood is appropriate for clinical situation.  Sensation was intact to light touch over the plantar surface of foot.  She had intact left foot dorsiflexion.  She had intact DP pulse.  She had no pain with internal and external rotation of her left hip.  She was able to be flexed to 90 degrees in her left hip.  She had 30 degrees of abduction, 20 degrees of external rotation 10 degrees of internal rotation, and 5 degrees of adduction of her left hip.    IMAGING:  Films taken in clinic today demonstrated no change in positioning of the components of a left total hip arthroplasty.  Her right lower extremity is actually shorter compared to the left when we draw a line over the ischial tuberosities.    ASSESSMENT:  Left total hip arthroplasty, 1 year.    PLAN:  We discussed with the patient today that our suspicion is that she has a pelvic obliquity, which contributes to the perception of leg length discrepancy.  We discussed nonoperative management with the spine team and she says that she has already been following with Dr. Donnal Debar.  She has intermittent sciatica, which she   feels is  tolerable at this time.  There is no indication for any further intervention of this.  We offered a spine center referral and she declined at this time.  We will see her back in 1 year for her 82-month.        Gerre Pebbles, MD  Resident   [place] [facility]    [name], MD  Assistant Professor   [facility] of [facility]opaedics     Late entry for 08/11/17. I saw and examined the patient.  I reviewed the resident's note.  I agree with the findings and plan of care as documented in the resident's note.  Any exceptions/additions are edited/noted.    [name], MD                DD:  08/11/2017 09:38:45  DT:  08/11/2017 19:53:17 LB  D#:  [id number]

## 2017-09-21 ENCOUNTER — Encounter (HOSPITAL_BASED_OUTPATIENT_CLINIC_OR_DEPARTMENT_OTHER): Payer: Self-pay | Admitting: Orthopaedic Surgery

## 2017-09-22 ENCOUNTER — Other Ambulatory Visit (HOSPITAL_BASED_OUTPATIENT_CLINIC_OR_DEPARTMENT_OTHER): Payer: Self-pay | Admitting: Physician Assistant

## 2017-09-22 MED ORDER — NAPROXEN 500 MG TABLET: 500 mg | Tab | Freq: Two times a day (BID) | ORAL | 3 refills | 0 days | Status: DC

## 2017-10-07 ENCOUNTER — Other Ambulatory Visit (HOSPITAL_BASED_OUTPATIENT_CLINIC_OR_DEPARTMENT_OTHER): Payer: Self-pay | Admitting: Family

## 2017-10-07 DIAGNOSIS — J0100 Acute maxillary sinusitis, unspecified: Secondary | ICD-10-CM

## 2017-10-07 DIAGNOSIS — M5432 Sciatica, left side: Secondary | ICD-10-CM

## 2017-10-07 MED ORDER — FLUTICASONE PROPIONATE 50 MCG/ACTUATION NASAL SPRAY,SUSPENSION
1.0000 | Freq: Every day | NASAL | 0 refills | Status: DC
Start: 2017-10-07 — End: 2018-12-21

## 2017-10-07 MED ORDER — CYCLOBENZAPRINE 10 MG TABLET: 10 mg | Tab | Freq: Every evening | ORAL | 0 refills | 0 days | Status: DC | PRN

## 2017-10-07 NOTE — Nursing Note (Signed)
Refill Request Note: [name]    Patient requests refill on listed medications.  Last visit at [facility]:  07/08/2017  Next pending visit [facility]: Visit date not found    Medication pended to provider for approval.    Odis Hollingshead, LPN 88/08/314, 94:58

## 2017-11-24 NOTE — Progress Notes (Deleted)
Care Team:     Primary Care Providers:  Derek Jack, APRN,FNP-BC (General)    Referring Provider:  Joaquin Bend    Subjective:     [name] is a 62 year old caucasian female who presents to our clinic for further surveillance of her asymptomatic, aortic stenosis and dilated ascending aorta.  She has a past medical history of HTN, former smoker, osteoarthritis and S/P L THA.  She was seen in our clinic last on 11/19/16 and her TTE revealed normal EF, trileaflet AV, and moderate AS (mean 21, peak velocity 331 and AVA 1.2).  She is asymptomatic and denies any physical limitations or daily obstacles.  She denies any chest/meck/jaw/back pain, dyspnea, dizziness, lightheadedness, palpitations, syncope or lower leg edema.  She had a TTE and CT chest completed today for our review.    Patient Active Problem List    Diagnosis Date Noted   . Left leg weakness 06/09/2017   . Low back pain radiating to left leg 12/02/2016   . Bilateral buttock pain 12/02/2016   . S/P hip replacement 08/13/2016   . Essential hypertension 02/12/2016   . Arthritis of left hip 02/05/2016     Past Medical History:   Diagnosis Date   . Aortic stenosis    . Arthritis    . Chronic pain    . Heart murmur     echo 02/2016, aortic stenosis   . Heartburn    . High blood pressure    . Nausea with vomiting    . Valvular disease     aortic valve stenosis, following with CT surgery, echo 02/2016 and cath 03/2016          Past Surgical History:   Procedure Laterality Date   . HX CESAREAN SECTION  1984   . HX HERNIA REPAIR  2004, 2006, 2008    x3   . HX HIP REPLACEMENT Left 08/11/2016   . HX HYSTERECTOMY  2003            Allergies   Allergen Reactions   . Iv Contrast Anaphylaxis   . Oregano      Hives     . Stadol [Butorphanol Tartrate] Mental Status Effect   . Tomato Hives/ Urticaria      Social History     Tobacco Use   . Smoking status: Former Smoker     Packs/day: 1.00     Years: 35.00     Pack years: 35.00   . Smokeless tobacco: Never  Used   . Tobacco comment: quit 2014   Substance Use Topics   . Alcohol use: Yes     Comment: social only      Family Medical History:     Problem Relation (Age of Onset)    Heart Disease Mother    Hypertension Father    Lung Cancer Father, Brother    No Known Problems Sister, Sister    Sudden Death no cause Mother             Review of Systems  Other than ROS in the HPI, all other systems were negative.    Objective:     There were no vitals taken for this visit.      Physical Exam:  General appearance: Appears healthy.  Alert; in no acute distress.  Pleasant.  Neck:  No JVD or bruits  Cardiac: PMI normal. No lifts, heaves, or thrills. RRR. No murmurs, clicks, gallops, or rubs    Sternum stable  Respiratory: Respiratory effort normal and clear to auscultation  Abdomen: soft, non-tender. Bowel sounds normal. No masses,  no organomegaly  Extremities: no cyanosis, clubbing or edema present    Neuro:  No acute focal deficits appreciated    Data Review    TTE and CT chest completed and reviewed by Dr. Lacinda Axon and revealed no significant change from prior studies.  Official report pending.      Assessment:     [name] is a 62 year old caucasian female who presents to our clinic for further surveillance of her asymptomatic, aortic stenosis and dilated ascending aorta.  She has a past medical history of HTN, former smoker, osteoarthritis and S/P L THA.  She was seen in our clinic last on 11/19/16 and her TTE revealed normal EF, trileaflet AV, and moderate AS (mean 21, peak velocity 331 and AVA 1.2).  She had a TTE and CT chest completed today for our review.  TTE and CT chest completed and reviewed by Dr. Lacinda Axon and revealed no significant change from prior studies per Dr. Lacinda Axon.     Plan:     1.  No surgical intervention is warranted at this time.  2.  Recommend strict blood pressure control with routine home monitoring and PCP follow ups.  3.  She was educated on the signs and symptoms of aortic dissection and  heart failure.  4.  Follow up in year with TTE and CT chest non contrast.      Dalbert Batman, APRN 11/24/2017, 07:35

## 2017-11-25 ENCOUNTER — Ambulatory Visit (HOSPITAL_BASED_OUTPATIENT_CLINIC_OR_DEPARTMENT_OTHER)
Admission: RE | Admit: 2017-11-25 | Discharge: 2017-11-25 | Disposition: A | Discharge status: Home / Self Care | Payer: 59 | Source: Ambulatory Visit | Attending: THORACIC SURGERY CARDIOTHORACIC VASCULAR SURGERY | Admitting: THORACIC SURGERY CARDIOTHORACIC VASCULAR SURGERY

## 2017-11-25 ENCOUNTER — Ambulatory Visit (HOSPITAL_BASED_OUTPATIENT_CLINIC_OR_DEPARTMENT_OTHER): Payer: 59 | Admitting: THORACIC SURGERY CARDIOTHORACIC VASCULAR SURGERY

## 2017-11-25 ENCOUNTER — Ambulatory Visit
Admission: RE | Admit: 2017-11-25 | Discharge: 2017-11-25 | Disposition: A | Discharge status: Home / Self Care | Payer: 59 | Source: Ambulatory Visit | Attending: THORACIC SURGERY CARDIOTHORACIC VASCULAR SURGERY | Admitting: THORACIC SURGERY CARDIOTHORACIC VASCULAR SURGERY

## 2017-11-25 ENCOUNTER — Encounter (HOSPITAL_COMMUNITY): Payer: Self-pay | Admitting: THORACIC SURGERY CARDIOTHORACIC VASCULAR SURGERY

## 2017-11-25 VITALS — BP 139/84 | HR 117 | Temp 97.7°F | Ht 64.88 in | Wt 203.5 lb

## 2017-11-25 DIAGNOSIS — I712 Thoracic aortic aneurysm, without rupture: Secondary | ICD-10-CM

## 2017-11-25 DIAGNOSIS — Z96642 Presence of left artificial hip joint: Secondary | ICD-10-CM | POA: Insufficient documentation

## 2017-11-25 DIAGNOSIS — I350 Nonrheumatic aortic (valve) stenosis: Principal | ICD-10-CM

## 2017-11-25 DIAGNOSIS — Z8249 Family history of ischemic heart disease and other diseases of the circulatory system: Secondary | ICD-10-CM | POA: Insufficient documentation

## 2017-11-25 DIAGNOSIS — I77819 Aortic ectasia, unspecified site: Secondary | ICD-10-CM | POA: Insufficient documentation

## 2017-11-25 DIAGNOSIS — I7121 Aneurysm of the ascending aorta, without rupture: Secondary | ICD-10-CM

## 2017-11-25 DIAGNOSIS — J439 Emphysema, unspecified: Secondary | ICD-10-CM

## 2017-11-25 DIAGNOSIS — I10 Essential (primary) hypertension: Secondary | ICD-10-CM | POA: Insufficient documentation

## 2017-11-25 DIAGNOSIS — Z87891 Personal history of nicotine dependence: Secondary | ICD-10-CM | POA: Insufficient documentation

## 2017-11-25 DIAGNOSIS — Z01818 Encounter for other preprocedural examination: Secondary | ICD-10-CM

## 2017-11-25 DIAGNOSIS — I352 Nonrheumatic aortic (valve) stenosis with insufficiency: Secondary | ICD-10-CM

## 2017-11-25 NOTE — Progress Notes (Signed)
Care Team:     Primary Care Providers:  Derek Jack, APRN,FNP-BC (General)    Referring Provider:  Derek Jack    Subjective:     [name] is a 62 year old caucasian female who presents to our clinic for further surveillance of her asymptomatic, aortic stenosis and dilated ascending aorta.  She has a past medical history of HTN, former smoker, osteoarthritis and S/P L THA.  She was seen in our clinic last on 11/19/16 and her TTE revealed normal EF, trileaflet AV, and moderate AS (mean 21, peak velocity 331 and AVA 1.2).  Since her last appointment she has developed SOB while walking up 1 flight of stairs and states she feels winded when she "walks fast". She denies any chest/meck/jaw/back pain, dizziness, lightheadedness, palpitations, syncope or lower leg edema.  She had a TTE and CT chest completed today for our review.    Patient Active Problem List    Diagnosis Date Noted   . Left leg weakness 06/09/2017   . Low back pain radiating to left leg 12/02/2016   . Bilateral buttock pain 12/02/2016   . S/P hip replacement 08/13/2016   . Essential hypertension 02/12/2016   . Arthritis of left hip 02/05/2016     Past Medical History:   Diagnosis Date   . Aortic stenosis    . Arthritis    . Chronic pain    . Heart murmur     echo 02/2016, aortic stenosis   . Heartburn    . High blood pressure    . Nausea with vomiting    . Valvular disease     aortic valve stenosis, following with CT surgery, echo 02/2016 and cath 03/2016          Past Surgical History:   Procedure Laterality Date   . HX CESAREAN SECTION  1984   . HX HERNIA REPAIR  2004, 2006, 2008    x3   . HX HIP REPLACEMENT Left 08/11/2016   . HX HYSTERECTOMY  2003            Allergies   Allergen Reactions   . Iv Contrast Anaphylaxis   . Oregano      Hives     . Stadol [Butorphanol Tartrate] Mental Status Effect   . Tomato Hives/ Urticaria      Social History     Tobacco Use   . Smoking status: Former Smoker     Packs/day: 1.00     Years:  35.00     Pack years: 35.00   . Smokeless tobacco: Never Used   . Tobacco comment: quit 2014   Substance Use Topics   . Alcohol use: Yes     Comment: social only      Family Medical History:     Problem Relation (Age of Onset)    Heart Disease Mother    Hypertension Father    Lung Cancer Father, Brother    No Known Problems Sister, Sister    Sudden Death no cause Mother             Review of Systems  Other than ROS in the HPI, all other systems were negative.    Objective:     BP 139/84   Pulse (!) 117   Temp 36.5 C (97.7 F) (Thermal Scan)   Ht 1.648 m (5' 4.88")   Wt 92.3 kg (203 lb 7.8 oz)   SpO2 91%   BMI 33.98 kg/m  Physical Exam:  General appearance: Appears healthy.  Alert; in no acute distress.  Pleasant.  Neck:  No JVD or bruits  Cardiac:  RRR. Ejection murmur 4/6 heard throughout pericardium    Sternum stable   Respiratory: Respiratory effort normal and clear to auscultation  Abdomen: soft, non-tender. Bowel sounds normal.  Extremities: no cyanosis, clubbing or edema present    Neuro:  No acute focal deficits appreciated    Data Review    TTE and CT chest completed and reviewed by Dr. Lacinda Axon and revealed severe AI and aneurysm in the upper limits of normal measuring 4 cm.  Official report pending.      Assessment:     [name] is a 62 year old caucasian female who presents to our clinic for further surveillance of her aortic stenosis and dilated ascending aorta.  She has a past medical history of HTN, former smoker, osteoarthritis and S/P L THA.  She was seen in our clinic last on 11/19/16 and her TTE revealed normal EF, trileaflet AV, and moderate AS (mean 21, peak velocity 331 and AVA 1.2).  She had a TTE and CT chest completed today which were reviewed per Dr Lacinda Axon and revealed severe AI.    Plan:     1.  Surgery was discussed with the patient given her worsening AI. She stated she wants to wait at least 6 months.  2.  Recommend strict blood pressure control with routine home  monitoring and PCP follow ups.  3.  She was educated on the signs and symptoms of aortic dissection and heart failure.  4.  Follow up in year with TTE and CT chest non contrast, unless symptoms worsen, she was instructed to call for sooner intervention     Starleen Blue, PA-C 11/25/2017, 14:52     Late entry for 11/25/17. I personally saw and evaluated the patient. See mid-level's note for additional details. My findings/participation are severe AS with mild symptoms.  She does not wish to undergo surgery at present and would like to consider over the next few months.  I explained that there is no benefit to waiting to intervene with severe AS and she should call ASAP for any new or progression of symptoms.      [name], MD

## 2017-11-25 NOTE — Patient Instructions (Addendum)
Cardiothoracic Surgery  [address]  [place], [place] [address]  Phone / [phone]  Fax / [phone]    Plan:  Patient is evaluated for aortic valve replacement with Dr. Lacinda Axon. We will determine minimally invasive versus sternotomy if your would need coronary bypass as well. If you wish to proceed at this time, please contact us. If you wish to wait, we will re-evaluate you in 1 year with a repeat TTE and discuss need for surgery. Please consider valve types (mechanical and tissue) and determine which you would like to choose.     Pre-admission testing and cardiac cath would need to be completed prior to surgery.    Dental clearance must be completed prior to surgery. Once we decide to proceed, we would need dental clearance provided. At that time, you will need to see your dentist and have them complete the dental form and fax back to [phone].    If you have any questions please call:  Lawanda Cousins, RN at [phone] or  Regan Lemming, RN at [phone]        What to Expect After [facility] Stay  Once out of the operating room, you will be taken to the CVICU, or cardiovascular intensive care unit. The stay within the CVICU is usually only one to two nights, but may be longer if closer observation is needed. Once you no longer require CVICU care, you will be transferred to 9SEor 10SE. These units are step down units and specialize in caring for postoperative heart surgery patients. Your whole stay at [facility] will be within the [facility]. The average stay for a patient experiencing heart surgery is 5-7 days. This is completely individual and some patients require more than 7 days to be discharged from the hospital. Remember, the more active and involved each patient is, the better the outcome and shorter the stay.  Drains/Equipment   Many people are surprised by the amount of things attached to their person after surgery. Things you can expect will  be: cardiac EKG monitor, oxygen probe attached to your finger, toe, head, or earlobe, chest tubes, JP drains, pacer wires coming from your chest with a long, rectangle pacer box, and wounds on your chest, ribs, and/or legs depending on the surgery and approach. The equipment gives your care team the ability to quickly assess your status. These readings are projected on computers at the nursing stations so someone is always able to monitor you.  Wound Care  You may raise your arms over your head to brush your teeth or wash your hair, but please be careful as you do so. It is helpful to breathe in through your nose as you raise your arms and out through your mouth as you lower them. Your sternum and surrounding muscles may be very sore. If your surgery is through your breastbone (sternum), do not lift anything over 5 pounds for 4 weeks after surgery. If your incision is on the side of your chest, do not lift anything heavier than 10 pounds for 4 weeks after surgery.   Do not push or pull with your arms, especially when rising from a bed/chair. Assistive devices should only be used for balance. Do not place your full weight on these devices until the incision is fully healed.  It is important to keep your incisions clean and dry.  Do not take tub baths for 4 weeks or until the Surgeon has given approval. Shower daily and pat  incisions dry with clean towel. Wash all incisions with antimicrobial soap and water with a clean cloth. Do not put any creams, lotions or antibiotic ointments on your incisions. Do not wear tight clothing that could cause irritation to your incisions. Keep your legs raised when sitting more than 15 minutes and remember to stretch.  If you experience any of the following symptoms, please notify the Surgeon's office immediately at [phone]. These symptoms include: increased warmth of the skin around the incision, redness that spreads out more than one inch from incision edges, increased  swelling, tightness or pain around an incision, large amount of clear or pinkish drainage, sudden increase in the amount of drainage, white, yellow, or green drainage with any odor coming from an incision, fever higher than 101 F (38.3 C), chills or temperature of 99 F (37.2 C) to 100.9 F (38.27 C) for more than 3 days. Continued or severe sadness, lightheadedness/dizziness, increased shortness of breath, burning when passing urine, severe calf pain, new severe pain in your chest, heart rate greater than 110 beats per minute or less than 50 beats per minutes.   Activity  Gradually increase activity as tolerated, no lifting over 10 pounds, and avoid vigorous/strenuous activity. We suggest cautious, progressive activity to regain your normal abilities. In time, you should be able to do your regular routine activities of daily living, return to work and take part in recreational activities.   Tips:  1. Get up and get dressed every day. Please do not stay in bed as this will slow your healing process. Wear comfortable clothes and be cautious of your incisions.   2. Complete tasks in sections throughout the day instead of all at once. Remember to pace yourself. If you do too much at once or in one day, you will feel overworked and may feel fatigued the following day and need to rest, which may slow recovery. Balance your activity and rest times so it is spread out throughout the day.   3. If you need to climb stairs, please go slowly and cautiously. Remember, it takes more energy to climb steps as it does walking on a level surface. You should only use a railing for balance. Please do not pull yourself up the steps using the railing as the strain can cause damage to your incision and alter healing.   4. Your body may begin to give you signals that it is being overworked and rest is needed. These include shortness of breath, dizziness, fatigue and pain. If you experience any of these, stop and rest.   Driving  You will  not be cleared to drive until after your follow-up appointment with the Surgeon approximately 2-4 weeks after surgery. Please do not drive while using Narcotic (pain) medications. If you were involved in a car accident, you would damage your sternum and any other areas that were manipulated during surgery. If a passenger, we suggest you ride in the back seat whenever possible. If you are a front seat passenger, move as far back from the dash as possible to increase leg room and use a pillow between your chest and the seat belt as to avoid incisional irritation or pain. We do not advise to take long trips until seen by the Surgeon after surgery or without a doctor's approval. When long trips are approved, please allow ample time to stops to walk and promote blood flow to prevent complications.

## 2018-02-09 ENCOUNTER — Encounter (INDEPENDENT_AMBULATORY_CARE_PROVIDER_SITE_OTHER): Payer: 59 | Admitting: PHYSICAL MEDICINE AND REHABILITATION

## 2018-02-11 ENCOUNTER — Ambulatory Visit (INDEPENDENT_AMBULATORY_CARE_PROVIDER_SITE_OTHER): Payer: Self-pay | Admitting: Optometrist

## 2018-07-27 ENCOUNTER — Telehealth (HOSPITAL_BASED_OUTPATIENT_CLINIC_OR_DEPARTMENT_OTHER): Payer: Self-pay | Admitting: Family

## 2018-07-27 NOTE — Telephone Encounter (Signed)
Patient called and requesting yearly physical exam. Appointment with Lillie Columbia on 1/21 made as it is her first available and patient refused to see another provider. Patient placed on the wait list. Beryl Meager, RN  07/27/2018, 16:15    Message     Received this as a MyChart schedule request. She is stating Cardiac issues.       Thanks,      Donnie   ----- Message -----   From: [name]   Sent: 07/27/2018 11:50 AM EDT   To: [facility]   Subject: Appointment Request                   Appointment Request From: [name]      With Provider: Derek Jack, APRN,FNP-BC [facility] [place]]      Preferred Date Range: 08/10/2018 - 09/30/2018      Preferred Times: Any time      Reason for visit: Annual Adult Physical (MyChart)      Comments:   If possible, I'd like an appointment before end of 2019. Heart issues concerns and need general physical exam.

## 2018-08-16 ENCOUNTER — Encounter (HOSPITAL_BASED_OUTPATIENT_CLINIC_OR_DEPARTMENT_OTHER): Payer: Self-pay | Admitting: Orthopaedic Surgery

## 2018-08-17 ENCOUNTER — Ambulatory Visit (HOSPITAL_BASED_OUTPATIENT_CLINIC_OR_DEPARTMENT_OTHER): Payer: 59 | Admitting: Orthopaedic Surgery

## 2018-08-17 ENCOUNTER — Encounter (HOSPITAL_BASED_OUTPATIENT_CLINIC_OR_DEPARTMENT_OTHER): Payer: Self-pay | Admitting: Orthopaedic Surgery

## 2018-08-17 ENCOUNTER — Ambulatory Visit
Admission: RE | Admit: 2018-08-17 | Discharge: 2018-08-17 | Disposition: A | Discharge status: Home / Self Care | Payer: 59 | Source: Ambulatory Visit | Attending: Orthopaedic Surgery | Admitting: Orthopaedic Surgery

## 2018-08-17 VITALS — BP 152/87 | HR 80 | Temp 97.3°F | Ht 65.71 in | Wt 213.0 lb

## 2018-08-17 DIAGNOSIS — M7918 Myalgia, other site: Secondary | ICD-10-CM

## 2018-08-17 DIAGNOSIS — M1611 Unilateral primary osteoarthritis, right hip: Secondary | ICD-10-CM | POA: Insufficient documentation

## 2018-08-17 DIAGNOSIS — Z96649 Presence of unspecified artificial hip joint: Secondary | ICD-10-CM

## 2018-08-17 DIAGNOSIS — Z09 Encounter for follow-up examination after completed treatment for conditions other than malignant neoplasm: Secondary | ICD-10-CM

## 2018-08-17 DIAGNOSIS — Z96642 Presence of left artificial hip joint: Secondary | ICD-10-CM

## 2018-08-17 DIAGNOSIS — Z471 Aftercare following joint replacement surgery: Secondary | ICD-10-CM | POA: Insufficient documentation

## 2018-08-17 NOTE — Progress Notes (Signed)
[facility]   DEPARTMENT OF ORTHOPAEDICS    SUBJECTIVE  [name] is a 62 y.o. female who returns to clinic today 2 years post op from a left total hip arthroplasty that was done on 08/13/16. She has done well. 3 weeks ago, she started having sharp buttock pain when she wakes up in the morning. It only lasts for a few seconds. She's had this about 9 times throughout the past 3 weeks. She denies groin and thigh pain. She has a history of sciatic and low back pain. Her right hip pain is also painful but isn't yet severe enough for surgery.    REVIEW OF SYSTEMS  General: negative for fevers or night sweats  Cardiovascular: negative for chest pain  Respiratory: negative for shortness of breath  Gastrointestinal: negative for digestive problems  Genitourinary: negative for urinary problems  Musculoskeletal: positive for right hip pain, positive for left buttock pain     All other systems reviewed and negative    PHYSICAL EXAMINATION   BP (!) 152/87   Pulse 80   Temp 36.3 C (97.3 F) (Thermal Scan)   Ht 1.669 m (5' 5.71")   Wt 96.6 kg (212 lb 15.4 oz)   BMI 34.68 kg/m   GEN:   NAD  PULM:   Normal respiratory effort.    MS:   Left hip has smooth and painless rotation.   NEURO:   Alert and oriented to person, place and time.    PSYCHOSOCIAL:   Pleasant.  Normal affect.    WOUND/INCISION:   No open wounds present.      RADIOGRAPHS  Radiographs were performed today and reviewed at this visit.    X-Ray left hip was interpreted by MD in clinic to show well positioned and aligned total hip arthroplasty that appears stable when compared to prior films.  Right hip has severe arthritic changes with sclerosis and osteophytes present.     ASSESSMENT     ICD-10-CM    1. Follow-up examination after orthopedic surgery Z09 Left [place] Series x-ray   2. Status post left hip replacement Z96.642    3. Osteoarthritis of right hip M16.11    4. Left buttock pain M79.18         PLAN  [name] is doing well. Her hip xrays are stable. We suspect her buttock pain is being referred from her spine. We offered her therapy, but she wants to continue yoga instead. If she changes her mind about therapy, she will call us and we can fax her in a PT Rx. She is not ready for R THA currently. She needs a valve replacement in the future and we will need cardiac's recommendations in the future regarding future THA. She will need antibiotics before future procedures. We will see her back in 3 years for scheduled follow-up and xrays. She can call us sooner with new or worsening concerns. Her questions were answered, and she agreed to this plan.    A shared visit was performed with the co-signing physician.    Katie L Seifried, PA-C 08/17/2018, 11:16     I personally saw and evaluated the patient. See mid-level's note for additional details. My findings/participation are new onset buttock pain.    [name], MD        Cc:   Derek Jack, APRN,FNP-BC  [place] [address]

## 2018-11-08 ENCOUNTER — Ambulatory Visit: Payer: 59 | Attending: Family | Admitting: Family

## 2018-11-08 ENCOUNTER — Encounter (HOSPITAL_BASED_OUTPATIENT_CLINIC_OR_DEPARTMENT_OTHER): Payer: Self-pay | Admitting: Family

## 2018-11-08 VITALS — BP 140/88 | HR 80 | Temp 97.7°F | Ht 65.5 in | Wt 211.4 lb

## 2018-11-08 DIAGNOSIS — I350 Nonrheumatic aortic (valve) stenosis: Secondary | ICD-10-CM | POA: Insufficient documentation

## 2018-11-08 DIAGNOSIS — Z791 Long term (current) use of non-steroidal anti-inflammatories (NSAID): Secondary | ICD-10-CM | POA: Insufficient documentation

## 2018-11-08 DIAGNOSIS — Z0000 Encounter for general adult medical examination without abnormal findings: Principal | ICD-10-CM | POA: Insufficient documentation

## 2018-11-08 DIAGNOSIS — Z13220 Encounter for screening for lipoid disorders: Secondary | ICD-10-CM

## 2018-11-08 DIAGNOSIS — L409 Psoriasis, unspecified: Secondary | ICD-10-CM | POA: Insufficient documentation

## 2018-11-08 DIAGNOSIS — Z87891 Personal history of nicotine dependence: Secondary | ICD-10-CM | POA: Insufficient documentation

## 2018-11-08 DIAGNOSIS — Z131 Encounter for screening for diabetes mellitus: Secondary | ICD-10-CM

## 2018-11-08 DIAGNOSIS — Z23 Encounter for immunization: Secondary | ICD-10-CM | POA: Insufficient documentation

## 2018-11-08 DIAGNOSIS — Z7951 Long term (current) use of inhaled steroids: Secondary | ICD-10-CM | POA: Insufficient documentation

## 2018-11-08 DIAGNOSIS — Z90710 Acquired absence of both cervix and uterus: Secondary | ICD-10-CM | POA: Insufficient documentation

## 2018-11-08 DIAGNOSIS — Z79899 Other long term (current) drug therapy: Secondary | ICD-10-CM | POA: Insufficient documentation

## 2018-11-08 DIAGNOSIS — I10 Essential (primary) hypertension: Secondary | ICD-10-CM | POA: Insufficient documentation

## 2018-11-08 MED ORDER — CLOBETASOL 0.05 % TOPICAL CREAM
TOPICAL_CREAM | Freq: Two times a day (BID) | CUTANEOUS | 1 refills | Status: DC
Start: 2018-11-08 — End: 2021-07-15

## 2018-11-08 MED ORDER — CLOBETASOL 0.05 % SCALP SOLUTION: Bottle | Freq: Two times a day (BID) | 1 refills | 0 days | Status: AC

## 2018-11-08 MED ORDER — HYDROCHLOROTHIAZIDE 25 MG TABLET
25.0000 mg | ORAL_TABLET | Freq: Every day | ORAL | 4 refills | Status: DC
Start: 2018-11-08 — End: 2019-01-10

## 2018-11-08 NOTE — Progress Notes (Signed)
SUBJECTIVE:   [name] is a 63 y.o. female  for presenting for her annual checkup.    Has elevated BP at her ortho appt and here today. Also was concerned at her cardiology appt last year. Use to take HCTZ in 2017, but this appears to have been discontinued by othro for unknown reason and pt states she was told to hold it and never restarted it.   Pt has no been seen in over a year.   HCM: had hysterectomy. Needs labs. tdap today, going to "think about Shingrix", will have cologuard for CRC screening.   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     Health Maintenance: Pending and Last Completed       Date Due Completion Date    Adult Tdap-Td (1 - Tdap) 10/28/1973 ---    Fecal Occult Blood/FIT Test: High Sensitivity 02/15/2018 02/15/2017    Shingles Vaccine (1 of 2) 11/09/2019 (Originally 10/28/2004) ---    Mammography 02/09/2019 02/08/2017    Depression Screening 11/09/2019 11/08/2018        Lab Results   Component Value Date    CHOLESTEROL 181 01/20/2016    HDLCHOL 60 01/20/2016    LDLCHOL 104 (H) 01/20/2016    TRIG 87 01/20/2016      No results found for: GLUCOSEFAST   Lab Results   Component Value Date    HA1C 5.3 01/20/2016       Diet History: "healthy" diet  in general  meals per day on average: usually 3  varied diet rich in fruits and vegetables  Calcium intake: inadequate  Exercise History: active, but does not do cardio exercise.     Medication list:   acetaminophen (TYLENOL) 500 mg Oral Tablet, Take 500 mg by mouth Every 4 hours as needed for Pain  CANNABIDIOL, CBD, EXTRACT ORAL, Take 15 mg by mouth Pt taking 1 caramel with 15 mg CBD extract.  fluticasone (FLONASE) 50 mcg/actuation Nasal Spray, Suspension, 1 Spray by Each Nostril route Once a day  loratadine (CLARITIN) 10 mg Oral Tablet, Take 10 mg by mouth Once a day  multivitamin Oral Tablet, Take 1 Tab by mouth Once a day  naproxen (NAPROSYN) 500 mg Oral Tablet, Take 1 Tab (500 mg total) by mouth Twice daily with  food  cyclobenzaprine (FLEXERIL) 10 mg Oral Tablet, Take 1 Tab (10 mg total) by mouth Every night as needed for Muscle spasms (Patient not taking: Reported on 08/17/2018)    No facility-administered medications prior to visit.      Tobacco History  reports that she has quit smoking. She has a 35.00 pack-year smoking history. She has never used smokeless tobacco.  Immunization History   Administered Date(s) Administered   . Tetanus Toxoid/Diphtheria Toxoid/Acellular Pertussis Vaccine, Adsorbed (Tdap) 11/08/2018     family history includes Atrial fibrillation in her sister; Heart Disease in her mother; Hypertension (High Blood Pressure) in her father; Lung Cancer in her brother and father; No Known Problems in her sister; Sudden Death no cause in her mother.    ROS:   Constitutional: negative for fevers, chills and weight loss  Eyes: negative for visual disturbance  Ears, nose, mouth, throat, and face: negative for hearing loss, earaches and hoarseness  Respiratory: negative for cough or dyspnea on exertion  Cardiovascular: negative for chest pressure/discomfort, palpitations, near-syncope, syncope  Gastrointestinal: negative for dysphagia, nausea, vomiting, diarrhea, constipation, abdominal pain and blood in bowel movements  Genitourinary:negative for frequency, dysuria, incontinence or  hematuria.    Menstrual history: post-hysterectomy, no sexual concerns or vaginal irritation.   Integument/breast: negative for rash and changed mole  Hematologic/lymphatic: negative for lymphadenopathy  Musculoskeletal:negative for myalgias, arthralgias and muscle weakness  Neurological: negative for seizures and stroke  or TIA symptoms  Behvioral/Psych: negative for depression and anxiety  Endocrine: negative for diabetic symptoms including polyuria, polydipsia and weight loss  Allergic/Immunologic: negative    OBJECTIVE:   Wt Readings from Last 3 Encounters:   11/08/18 95.9 kg (211 lb 6.7 oz)   08/17/18 96.6 kg (212 lb 15.4 oz)      11/25/17 92.3 kg (203 lb 7.8 oz)     BP Readings from Last 3 Encounters:   11/08/18 140/88   08/17/18 (!) 152/87   11/25/17 139/84     BP 140/88   Pulse 80   Temp 36.5 C (97.7 F) (Thermal Scan)   Ht 1.664 m (5' 5.5")   Wt 95.9 kg (211 lb 6.7 oz)   SpO2 97%   BMI 34.65 kg/m     Body mass index is 34.65 kg/m.   General appearance: alert, well appearing in no distress.  PERLA.  TMs, mouth and pharynx are normal.  Neck supple without adenopathy or thyromegaly.  Lungs are clear, good air entry, no wheezes, rhonchi or rales. Heart: + murmur, regular rate and rhythm.  No edema.  Abdomen soft without tenderness, guarding, mass or organomegaly.  Skin without worrisome lesions.  Reflexes are normal.  Breasts: normal without suspicious masses, skin or nipple changes or axillary nodes, self-exam is taught and encouraged.  Pelvic exam: deferred.     PHQ Questionnaire  Little interest or pleasure in doing things.: Several Days  Feeling down, depressed, or hopeless: Not at all  PHQ 2 Total: 1    ASSESSMENT & PLAN:     ICD-10-CM    1. Annual physical exam Z00.00    2. Need for Tdap vaccination Z23 Tdap Vaccine (Boostrix) >= 10 yrs   3. Screening cholesterol level Z13.220 LIPID PANEL   4. Screening for diabetes mellitus (DM) Z13.1 COMPREHENSIVE METABOLIC PNL, FASTING   5. Psoriasis of scalp L40.9 clobetasoL (CORMAX) 0.05 % Solution   6. Psoriasis L40.9 clobetasoL (TEMOVATE) 0.05 % Cream   7. Essential hypertension I10 hydroCHLOROthiazide (HYDRODIURIL) 25 mg Oral Tablet   8. Severe aortic stenosis I35.0      Elevated BP- unknown duration- stopped anti-hypertensive's in 2017. Labs as above. Will restart HCTZ and see her in 6 weeks.   Scalp and ear treatment was above  If she wants Shingrix- she will schedule a nurse only visit.   Aortic stenosis- has follow-up with cards in Feb    Return in about 1 year (around 11/09/2019) for wellness visit.  Patient was seen independently, co-signing physician on site if consultation was  needed.    Derek Jack, APRN,FNP-BC  [facility], [place]  Operated by [facility]  [address]  [place] [address]  Dept: [phone]

## 2018-11-08 NOTE — Patient Instructions (Signed)
Vaccine Information Statement     Tdap (Tetanus, Diphtheria, Pertussis) Vaccine: What You Need to Know    Many Vaccine Information Statements are available in Spanish and other languages. See www.immunize.org/vis.  Hojas de Informacin Sobre Vacunas estn disponibles en espaol y en muchos otros idiomas. Visite http://www.immunize.org/vis    1. Why get vaccinated?    Tetanus, diphtheria, and pertussis are very serious diseases. Tdap vaccine can protect us from these diseases. And, Tdap vaccine given to pregnant women can protect newborn babies against pertussis.    TETANUS (Lockjaw) is rare in the [place] today. It causes painful muscle tightening and stiffness, usually all over the body.  It can lead to tightening of muscles in the head and neck so you can't open your mouth, swallow, or sometimes even breathe. Tetanus kills about 1 out of 10 people who are infected even after receiving the best medical care.   DIPHTHERIA is also rare in the [place] today. It can cause a thick coating to form in the back of the throat.  It can lead to breathing problems, heart failure, paralysis, and death.   PERTUSSIS (Whooping Cough) causes severe coughing spells, which can cause difficulty breathing, vomiting, and disturbed sleep.  It can also lead to weight loss, incontinence, and rib fractures. Up to 2 in 100 adolescents and 5 in 100 adults with pertussis are hospitalized or have complications, which could include pneumonia or death.These diseases are caused by bacteria. Diphtheria and pertussis are spread from person to person through secretions from coughing or sneezing. Tetanus enters the body through cuts, scratches, orwounds.Before vaccines, as many as 200,000 cases of diphtheria, 200,000 cases of pertussis,and hundreds of cases of tetanus, were reported in the [place] each year. Since vaccination began, reports of cases for tetanus and diphtheria have dropped by about 99% and for  pertussis by about 80%.   2. Tdap vaccine   Tdap vaccine can protect adolescents and adults from tetanus, diphtheria, and pertussis. One dose of Tdap is routinely given at age 11 or 12. People who did not get Tdap at that age should get it as soon as possible.Tdap is especially important for health care professionals and anyone having close contact with a baby younger than 12 months.Pregnant women should get a dose of Tdap during every pregnancy, to protect the newborn from pertussis.    Infants are most at risk for severe, life-threatening complications from pertussis.Another vaccine, called Td, protects against tetanus and diphtheria, but not pertussis. A Td booster should be given every 10 years. Tdap may be given as one of these boosters if you have never gotten Tdap before. Tdap may also be given after a severe cut or burn to prevent tetanus infection.Your doctor or the person giving you the vaccine can give you more information.Tdap may safely be given at the same time as other vaccines.   3. Some people should not get this vaccine   A person who has ever had a life-threatening allergic reaction after a previous dose of any diphtheria, tetanus or pertussis containing vaccine, OR has a severe allergy to any part of this vaccine, should not get Tdap vaccine. Tell the person giving the vaccine about any severe allergies.     Anyone who had coma or long repeated seizures within 7 days after a childhood dose of DTP or DTaP, or a previous dose of Tdap, should not get Tdap, unless a cause other than the vaccine was found. They can still get Td.     Talk to your doctor if you:   have seizures or another nervous system problem,   had severe pain or swelling after any vaccine containing diphtheria, tetanus or pertussis,   ever had a condition called Guillain Barr Syndrome (GBS),   aren't feeling well on the day the shot is scheduled.    4. Risks  With any medicine, including vaccines, there  is a chance of side effects. These are usually mild and go away on their own. Serious reactions are also possible but are rare.     Most people who get Tdap vaccine do not have any problems with it.    Mild Problems following Tdap  (Did not interfere with activities)   Pain where the shot was given (about 3 in 4 adolescents or 2 in 3 adults)   Redness or swelling where the shot was given (about 1 person in 5)   Mild fever of at least 100.4F (up to about 1 in 25 adolescents or 1 in 100 adults)   Headache (about 3 or 4 people in 10)   Tiredness (about 1 person in 3 or 4)   Nausea, vomiting, diarrhea, stomach ache (up to 1 in 4 adolescents or 1 in 10 adults)   Chills, sore joints (about 1 person in 10)   Body aches (about 1 person in 3 or 4)   Rash, swollen glands (uncommon)Moderate Problems following Tdap(Interfered with activities, but did not require medical attention)   Pain where the shot was given (up to 1 in 5 or 6)   Redness or swelling where the shot was given (up to about 1 in 16 adolescents or 1 in 12 adults)   Fever over 102F (about 1 in 100 adolescents or 1 in 250 adults)   Headache (about 1 in 7 adolescents or 1 in 10 adults)   Nausea, vomiting, diarrhea, stomach ache (up to 1 or 3 people in 100)   Swelling of the entire arm where the shot was given (up to about 1 in 500).   Severe Problems following Tdap(Unable to perform usual activities; required medical attention)   Swelling, severe pain, bleeding, and redness in the arm where the shot was given (rare).   Problems that could happen after any vaccine:     People sometimes faint after a medical procedure, including vaccination. Sitting or lying down for about 15 minutes can help prevent fainting, and injuries caused by a fall. Tell your doctor if you feel dizzy, or have vision changes or ringing in the ears.   Some people get severe pain in the shoulder and have difficulty moving the arm where a shot was given.  This happens very rarely.   Any medication can cause a severe allergic reaction. Such reactions from a vaccine are very rare, estimated at fewer than 1 in a million doses, and would happen within a few minutes to a few hours after the vaccination.    As with any medicine, there is a very remote chance of a vaccine causing a serious injury or death.    The safety of vaccines is always being monitored. For more information, visit: www.cdc.gov/vaccinesafety/    5. What if there is a serious problem?    What should I look for?   Look for anything that concerns you, such as signs of a severe allergic reaction, very high fever, or unusual behavior.   Signs of a severe allergic reaction can include hives, swelling of the face and throat, difficulty breathing, a fast   heartbeat, dizziness, and weakness. These would usually start a few minutes to a few hours after the vaccination.    What should I do?   If you think it is a severe allergic reaction or other emergency that can't wait, call 9-1-1 or get the person to the nearest hospital. Otherwise, call your doctor.   Afterward, the reaction should be reported to the Vaccine Adverse Event Reporting System (VAERS). Your doctor might file this report, or you can do it yourself through the VAERS web site at www.vaers.hhs.gov, or by calling 1-800-822-7967.VAERS does not give medical advice.   6. The National Vaccine Injury Compensation Program   The National Vaccine Injury Compensation Program (VICP) is a federal program that was created to compensate people who may have been injured by certain vaccines.Persons who believe they may have been injured by a vaccine can learn about the program and about filing a claim by calling 1-800-338-2382 or visiting the VICP website at www.hrsa.gov/vaccinecompensation. There is a time limit to file a claim for compensation.    7. How can I learn more?     Ask your doctor. He or she can give you the vaccine package insert  or suggest other sources of information.   Call your local or state health department.   Contact the Centers for Disease Control and Prevention (CDC):  - Call 1-800-232-4636 (1-800-CDC-INFO) or  - Visit CDC's website at www.cdc.gov/vaccines      Vaccine Information Statement   Tdap Vaccine  (12/12/2013)  42 U.[place].C.  300aa-26    Department of Health and Human Services  Centers for Disease Control and Prevention

## 2018-11-10 ENCOUNTER — Encounter (HOSPITAL_BASED_OUTPATIENT_CLINIC_OR_DEPARTMENT_OTHER): Payer: Self-pay | Admitting: Family

## 2018-11-10 ENCOUNTER — Ambulatory Visit (HOSPITAL_BASED_OUTPATIENT_CLINIC_OR_DEPARTMENT_OTHER): Payer: 59

## 2018-11-10 ENCOUNTER — Other Ambulatory Visit (HOSPITAL_BASED_OUTPATIENT_CLINIC_OR_DEPARTMENT_OTHER): Payer: Self-pay | Admitting: Family

## 2018-11-10 ENCOUNTER — Ambulatory Visit: Payer: 59 | Attending: DERMATOLOGY | Admitting: Family

## 2018-11-10 VITALS — Temp 97.3°F | Ht 65.35 in | Wt 213.2 lb

## 2018-11-10 DIAGNOSIS — C4401 Basal cell carcinoma of skin of lip: Secondary | ICD-10-CM | POA: Insufficient documentation

## 2018-11-10 DIAGNOSIS — D489 Neoplasm of uncertain behavior, unspecified: Secondary | ICD-10-CM

## 2018-11-10 DIAGNOSIS — D485 Neoplasm of uncertain behavior of skin: Secondary | ICD-10-CM

## 2018-11-10 DIAGNOSIS — L851 Acquired keratosis [keratoderma] palmaris et plantaris: Secondary | ICD-10-CM | POA: Insufficient documentation

## 2018-11-10 NOTE — Addendum Note (Signed)
Addended by: Nelda Severe D on: 11/10/2018 01:07 PM     Modules accepted: Orders

## 2018-11-10 NOTE — Progress Notes (Signed)
Dermatology Clinic, [facility]  [place] [address]  [phone]    Date:   11/10/2018  Name: [name]  Age: 63 y.o.    Chief complaint: Skin Check    HPI  [name] is a 63 y.o. female presenting for a skin check. Patient states she has had a lesion on her left upper lip for 5 years. States lesion bleeds easily and itches. Denies rapid change. Patient also complains of a lesion on her lower left leg. States lesion has been there for 10+ years. Denies changing, bleeding, or itching. Otherwise denies any new, changing, bleeding, or rapidly growing lesions.  No other skin-related complaints. No personal history of skin cancer, but states her sister has a history of a NMSC.         Review of Systems   Constitutional: Negative for chills and fever.   Skin: Negative for itching and rash.     Current Medications  . acetaminophen (TYLENOL) 500 mg Oral Tablet Take 500 mg by mouth Every 4 hours as needed for Pain   . CANNABIDIOL, CBD, EXTRACT ORAL Take 15 mg by mouth Pt taking 1 caramel with 15 mg CBD extract.   . clobetasoL (CORMAX) 0.05 % Solution by Apply Topically route Twice daily   . clobetasoL (TEMOVATE) 0.05 % Cream by Apply Topically route Twice daily   . fluticasone (FLONASE) 50 mcg/actuation Nasal Spray, Suspension 1 Spray by Each Nostril route Once a day   . hydroCHLOROthiazide (HYDRODIURIL) 25 mg Oral Tablet Take 1 Tab (25 mg total) by mouth Once a day   . loratadine (CLARITIN) 10 mg Oral Tablet Take 10 mg by mouth Once a day   . multivitamin Oral Tablet Take 1 Tab by mouth Once a day   . naproxen (NAPROSYN) 500 mg Oral Tablet Take 1 Tab (500 mg total) by mouth Twice daily with food     Allergies   Allergen Reactions   . Iv Contrast Anaphylaxis   . Oregano      Hives     . Stadol [Butorphanol Tartrate] Mental Status Effect   . Tomato Hives/ Urticaria     Past Medical History:   Diagnosis Date   . Aortic stenosis    . Arthritis    . Chronic pain     . Heart murmur     echo 02/2016, aortic stenosis   . Heartburn    . High blood pressure    . Nausea with vomiting    . Valvular disease     aortic valve stenosis, following with CT surgery, echo 02/2016 and cath 03/2016         Physical Exam  Vitals: Temperature 36.3 C (97.3 F), temperature source Thermal Scan, height 1.66 m (5' 5.35"), weight 96.7 kg (213 lb 3 oz), not currently breastfeeding.  Physical Exam  Constitutional:       General: She is not in acute distress.     Appearance: Normal appearance.   HENT:      Head:     Skin:     General: Skin is warm and dry.      Coloration: Skin is not pale.          Neurological:      Mental Status: She is alert and oriented to person, place, and time. Mental status is at baseline.      Coordination: Coordination normal.     General skin exam was performed including head, neck, anterior/posterior  trunk, bilateral upper & lower extremities and revealed no areas of concern other than those documented.    Assessment and Plan  Problem List Items Addressed This Visit     None      Visit Diagnoses     Neoplasm of uncertain behavior    -  Primary      1. 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 Consent obtained, area cleaned, and anesthetized. A shave biopsy was performed.Aluminum chloride was used for hemostasis. Vaseline and bandage were placed over the wound and wound care instructions were given. Patient demonstrated understanding of the instructions. Patient was advised that it would take approximately 2-3 weeks for the pathology results to be available and that I will personally contact the patient at the number provided to further discuss the pathology results.    2. 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   The condition is usually asymptomatic, and unless disturbed,  most persist and grow slowly.  No intervention is indicated at this time.  I will continue to follow the patient.        The patient was educated on the importance of avoiding excessive sun exposure and wearing sunscreen daily SPF 30 or higher.  Advised patient to re-apply sunscreen every 2-3 hours.  Advised the patient to avoid going to and using tanning beds.  Advised to check skin routinely for any changes, especially any new moles or changes in existing mole      RTC in 6 months or sooner if needed    This is a shared visit with Dr. Lanice Schwab, APRN,FNP-BC  11/10/2018, 12:32  I personally saw and evaluated the patient. See mid-level's note for additional details. My findings/participation are lesion left upper lip suspecious for [facility], will biopsy.    Mauri Reading, MD

## 2018-11-14 LAB — HISTORICAL SURGICAL PATHOLOGY SPECIMEN

## 2018-11-17 ENCOUNTER — Ambulatory Visit (HOSPITAL_BASED_OUTPATIENT_CLINIC_OR_DEPARTMENT_OTHER): Payer: Self-pay | Admitting: Family

## 2018-11-17 NOTE — Telephone Encounter (Signed)
Patient scheduled for MOHS on 12-14-2018 with Dr. Renella Cunas at [facility]. [name]  11/17/2018, 11:33

## 2018-11-17 NOTE — Telephone Encounter (Signed)
Faxed referral for MOHS to [facility] for [facility] Left Upper Lip. Jan D Johnson  11/17/2018, 10:05

## 2018-11-21 ENCOUNTER — Other Ambulatory Visit: Payer: Self-pay

## 2018-11-21 ENCOUNTER — Ambulatory Visit: Payer: 59 | Attending: Family | Admitting: Gynecology

## 2018-11-21 DIAGNOSIS — Z13220 Encounter for screening for lipoid disorders: Secondary | ICD-10-CM

## 2018-11-21 DIAGNOSIS — Z131 Encounter for screening for diabetes mellitus: Principal | ICD-10-CM | POA: Insufficient documentation

## 2018-11-21 LAB — COMPREHENSIVE METABOLIC PNL, FASTING
ALBUMIN: 4.1 g/dL (ref 3.4–4.8)
ALKALINE PHOSPHATASE: 73 U/L (ref 50–130)
ALT (SGPT): 14 U/L (ref <55)
ALT (SGPT): 14 U/L (ref <55)
ANION GAP: 7 mmol/L (ref 4–13)
ANION GAP: 7 mmol/L (ref 4–13)
AST (SGOT): 17 U/L (ref 8–41)
BILIRUBIN TOTAL: 0.5 mg/dL (ref 0.3–1.3)
BUN/CREA RATIO: 19 (ref 6–22)
BUN: 15 mg/dL (ref 8–25)
CALCIUM: 9.4 mg/dL (ref 8.5–10.2)
CHLORIDE: 98 mmol/L (ref 96–111)
CO2 TOTAL: 31 mmol/L (ref 22–32)
CREATININE: 0.8 mg/dL (ref 0.49–1.10)
ESTIMATED GFR: >60 mL/min/1.73mˆ2 (ref >60)
GLUCOSE: 90 mg/dL (ref 70–105)
POTASSIUM: 4.1 mmol/L (ref 3.5–5.1)
PROTEIN TOTAL: 7.4 g/dL (ref 6.0–8.0)
SODIUM: 136 mmol/L (ref 136–145)

## 2018-11-21 LAB — LIPID PANEL
CHOL/HDL RATIO: 2.7
CHOLESTEROL: 195 mg/dL (ref <200)
HDL CHOL: 73 mg/dL (ref >=49)
LDL CALC: 106 mg/dL — ABNORMAL HIGH (ref <=100)
NON-HDL: 122 mg/dL (ref <=190)
TRIGLYCERIDES: 78 mg/dL (ref <=150)
VLDL CALC: 16 mg/dL (ref <30)

## 2018-11-21 LAB — FECAL DNA TESTING (AMB): FECAL DNA TEST (AMB): POSITIVE

## 2018-12-01 ENCOUNTER — Ambulatory Visit
Admission: RE | Admit: 2018-12-01 | Discharge: 2018-12-01 | Disposition: A | Discharge status: Home / Self Care | Payer: 59 | Source: Ambulatory Visit | Attending: Nurse Practitioner | Admitting: Nurse Practitioner

## 2018-12-01 ENCOUNTER — Ambulatory Visit (HOSPITAL_BASED_OUTPATIENT_CLINIC_OR_DEPARTMENT_OTHER): Payer: 59 | Admitting: THORACIC SURGERY CARDIOTHORACIC VASCULAR SURGERY

## 2018-12-01 ENCOUNTER — Ambulatory Visit (HOSPITAL_BASED_OUTPATIENT_CLINIC_OR_DEPARTMENT_OTHER)
Admission: RE | Admit: 2018-12-01 | Discharge: 2018-12-01 | Disposition: A | Discharge status: Home / Self Care | Payer: 59 | Source: Ambulatory Visit

## 2018-12-01 ENCOUNTER — Other Ambulatory Visit: Payer: Self-pay

## 2018-12-01 ENCOUNTER — Encounter (HOSPITAL_COMMUNITY): Payer: Self-pay | Admitting: THORACIC SURGERY CARDIOTHORACIC VASCULAR SURGERY

## 2018-12-01 ENCOUNTER — Telehealth (HOSPITAL_COMMUNITY): Payer: Self-pay | Admitting: Cardiovascular Disease

## 2018-12-01 VITALS — BP 144/81 | HR 89 | Ht 65.0 in | Wt 211.0 lb

## 2018-12-01 DIAGNOSIS — I712 Thoracic aortic aneurysm, without rupture: Secondary | ICD-10-CM

## 2018-12-01 DIAGNOSIS — J439 Emphysema, unspecified: Secondary | ICD-10-CM | POA: Insufficient documentation

## 2018-12-01 DIAGNOSIS — I350 Nonrheumatic aortic (valve) stenosis: Secondary | ICD-10-CM | POA: Insufficient documentation

## 2018-12-01 DIAGNOSIS — Z96642 Presence of left artificial hip joint: Secondary | ICD-10-CM | POA: Insufficient documentation

## 2018-12-01 DIAGNOSIS — I2510 Atherosclerotic heart disease of native coronary artery without angina pectoris: Secondary | ICD-10-CM | POA: Insufficient documentation

## 2018-12-01 DIAGNOSIS — I340 Nonrheumatic mitral (valve) insufficiency: Secondary | ICD-10-CM

## 2018-12-01 DIAGNOSIS — M1990 Unspecified osteoarthritis, unspecified site: Secondary | ICD-10-CM | POA: Insufficient documentation

## 2018-12-01 DIAGNOSIS — Z01818 Encounter for other preprocedural examination: Secondary | ICD-10-CM

## 2018-12-01 DIAGNOSIS — I10 Essential (primary) hypertension: Secondary | ICD-10-CM | POA: Insufficient documentation

## 2018-12-01 DIAGNOSIS — Z87891 Personal history of nicotine dependence: Secondary | ICD-10-CM | POA: Insufficient documentation

## 2018-12-01 DIAGNOSIS — I7121 Aneurysm of the ascending aorta, without rupture: Secondary | ICD-10-CM

## 2018-12-01 LAB — TYPE AND SCREEN
ABO/RH(D): O POS
ANTIBODY SCREEN: NEGATIVE

## 2018-12-01 LAB — CBC WITH DIFF
BASOPHIL #: <0.1 x10ˆ3/uL (ref <=0.20)
BASOPHIL %: 0 %
EOSINOPHIL #: <0.1 x10ˆ3/uL (ref <=0.50)
EOSINOPHIL %: 1 %
HCT: 43.2 % (ref 34.8–46.0)
HGB: 14.2 g/dL (ref 11.5–16.0)
IMMATURE GRANULOCYTE #: <0.1 10*3/uL (ref <0.10)
IMMATURE GRANULOCYTE %: 0 % (ref 0–1)
IMMATURE GRANULOCYTE %: 0 % (ref 0–1)
LYMPHOCYTE #: 2.07 x10ˆ3/uL (ref 1.00–4.80)
LYMPHOCYTE %: 29 %
MCH: 29.7 pg (ref 26.0–32.0)
MCHC: 32.9 g/dL (ref 31.0–35.5)
MCV: 90.4 fL (ref 78.0–100.0)
MONOCYTE #: 0.47 x10ˆ3/uL (ref 0.20–1.10)
MONOCYTE %: 7 %
MPV: 8.4 fL — ABNORMAL LOW (ref 8.7–12.5)
NEUTROPHIL #: 4.62 x10ˆ3/uL (ref 1.50–7.70)
NEUTROPHIL %: 63 %
PLATELETS: 253 x10ˆ3/uL (ref 150–400)
RBC: 4.78 10*6/uL (ref 3.85–5.22)
RDW-CV: 11.7 % (ref 11.5–15.5)
WBC: 7.3 10*3/uL (ref 3.7–11.0)

## 2018-12-01 LAB — BASIC METABOLIC PANEL
ANION GAP: 10 mmol/L (ref 4–13)
BUN/CREA RATIO: 18 (ref 6–22)
BUN: 15 mg/dL (ref 8–25)
CALCIUM: 9.6 mg/dL (ref 8.5–10.2)
CHLORIDE: 95 mmol/L — ABNORMAL LOW (ref 96–111)
CO2 TOTAL: 27 mmol/L (ref 22–32)
CREATININE: 0.82 mg/dL (ref 0.49–1.10)
ESTIMATED GFR: >60 mL/min/1.73mˆ2 (ref >60)
GLUCOSE: 92 mg/dL (ref 65–139)
POTASSIUM: 4.1 mmol/L (ref 3.5–5.1)
SODIUM: 132 mmol/L — ABNORMAL LOW (ref 136–145)

## 2018-12-01 LAB — PT/INR
INR: 1.12 (ref 0.80–1.20)
PROTHROMBIN TIME: 12.9 s (ref 9.1–13.9)

## 2018-12-01 NOTE — Patient Instructions (Signed)
Cardiac Surgery  [address], [address]  [place],  [place] [address]  Phone / [phone] ([place])  Fax / [phone]      Medication Adjustments:  None.     Follow up:  You have met with Dr. Lacinda Axon.  Dr. Lacinda Axon is very concerned with your aortic valve disease.  You have expressed your concerns with moving forward with surgery.  You have voiced you would like to evaluate the TAVR option.      We have discussed getting a heart cath done and presenting your case at structural heart.

## 2018-12-01 NOTE — Patient Instructions (Signed)
[facility]  [place], [place],  [address]  Phone / [phone]   Fax / [phone]     Referring physician: Dr Lacinda Axon  Patient phone number: [phone] Jerilynn Mages)  Insurance: [employer] Discussed with: patient in clinic on 12/01/18  For questions regarding pre-operative instructions please call: Katherine Roan at [phone]  Plan:  Cardiac Cath on 12/06/2018.   Marland Kitchen You will be called the work day prior to your procedure between 2-5pm to confirm your arrival time.   . You will arrive about 1 1/2 hours before your procedure.    Plan for future testing: none needed at this time. Labs in clinic on 12/01/18  Pre-Procedure/Op needs- NON FASTING Labs BMP, CBC/diff & PT/INR (within 30 days of procedure)      Pre-procedure/operative instructions:  1. Attire: Please dress in loose comfortable clothing.   2. Diet Instructions:   . You may have a regular diet until 8 hours prior to arrival   . You may have clear liquids (only) until 2 hours prior to arrival   3. Medication instructions:  Contrast Allergy- Yes-- You will be pre medicated in the PREOP area prior to your cath.  . You may take all other meds with a sip of water, including Aspirin and Plavix.    4. Transportation:  You will need someone to drive you home when you are discharged  . You may wish to pack a bag. Every effort will be made to discharge you home the same day, however you may need to stay overnight  5. Directions day of procedure:  . Your procedure(s) will take place in the [facility] on [address].   . The day of your procedure we encourage you to pull into the circle in front of the [facility] and take advantage of our complimentary valet parking. The entrance to the [facility] is right in the front. Upon entering the [facility], go  past the [facility] desk, walk past the wall of television screens all the way to the back wall (curvy lines) and take the elevator on your right up to 2nd floor and check in at the registration desk.

## 2018-12-01 NOTE — Progress Notes (Signed)
Care Team:     Primary Care Providers:  Derek Jack, APRN,FNP-BC (General)    Referring Provider:  Joaquin Bend    Subjective:     [name] is a 63 year old caucasian female who presents to our clinic for further surveillance of her ascending aortic aneurysm and aortic stenosis.  She has a past medical history of known aortic stenosis, HTN, emphysema, former smoker, arthritis, S/P L THA and hernia repair.  She was seen in clinic last on 11/25/17.  She is here for her 1 year follow up.  She had a CT chest on 11/25/17 that revealed ascending aortic aneurysm of 4.0.  TTE on 11/25/17 revealed EF of 55%, no regional wall abnormalities, mild AI, mild MR and severe AS (mean of 43.8, VMAX of 400.8, AVA of 0.8).  Her last heart cath on 03/26/16 revealed mild, non obstructive coronary artery disease.  As for symptoms, patient reports only mild dyspnea on exertion for the last few months.  She reports mild dyspnea with her house work.  She reports stress and activity can worsen symptoms while rest can help alleviate them. She also notes intermittent mild dizziness the last week.  She denies any chest pain, orthopnea, palpitations, syncope or edema.               Patient Active Problem List    Diagnosis Date Noted   . Aortic stenosis 12/01/2018   . Severe aortic stenosis 11/08/2018   . Low back pain radiating to left leg 12/02/2016   . S/P hip replacement 08/13/2016   . Essential hypertension 02/12/2016     Past Medical History:   Diagnosis Date   . Aortic stenosis    . Arthritis    . Chronic pain    . Heart murmur     echo 02/2016, aortic stenosis   . Heartburn    . High blood pressure    . Nausea with vomiting    . Valvular disease     aortic valve stenosis, following with CT surgery, echo 02/2016 and cath 03/2016          Past Surgical History:   Procedure Laterality Date   . HX CESAREAN SECTION  1984   . HX HERNIA REPAIR  2004, 2006, 2008    x3   . HX HIP REPLACEMENT Left 08/11/2016   . HX HYSTERECTOMY   2003            Allergies   Allergen Reactions   . Iv Contrast Anaphylaxis   . Oregano      Hives     . Stadol [Butorphanol Tartrate] Mental Status Effect   . Tomato Hives/ Urticaria      Social History     Tobacco Use   . Smoking status: Former Smoker     Packs/day: 1.00     Years: 35.00     Pack years: 35.00   . Smokeless tobacco: Never Used   . Tobacco comment: quit 2014   Substance Use Topics   . Alcohol use: Yes     Comment: social only      Family Medical History:     Problem Relation (Age of Onset)    Atrial fibrillation Sister    Heart Disease Mother    Hypertension (High Blood Pressure) Father    Lung Cancer Father, Brother    No Known Problems Sister    Sudden Death no cause Mother  Current Outpatient Medications:   .  acetaminophen (TYLENOL) 500 mg Oral Tablet, Take 500 mg by mouth Every 4 hours as needed for Pain, Disp: , Rfl:   .  CANNABIDIOL, CBD, EXTRACT ORAL, Take 15 mg by mouth Pt taking 1 caramel with 15 mg CBD extract., Disp: , Rfl:   .  clobetasoL (CORMAX) 0.05 % Solution, by Apply Topically route Twice daily, Disp: 1 Bottle, Rfl: 1  .  clobetasoL (TEMOVATE) 0.05 % Cream, by Apply Topically route Twice daily, Disp: 1 Tube, Rfl: 1  .  fluticasone (FLONASE) 50 mcg/actuation Nasal Spray, Suspension, 1 Spray by Each Nostril route Once a day, Disp: 3 Bottle, Rfl: 0  .  hydroCHLOROthiazide (HYDRODIURIL) 25 mg Oral Tablet, Take 1 Tab (25 mg total) by mouth Once a day, Disp: 90 Tab, Rfl: 4  .  loratadine (CLARITIN) 10 mg Oral Tablet, Take 10 mg by mouth Once a day, Disp: , Rfl:   .  multivitamin Oral Tablet, Take 1 Tab by mouth Once a day, Disp: , Rfl:   .  naproxen (NAPROSYN) 500 mg Oral Tablet, Take 1 Tab (500 mg total) by mouth Twice daily with food, Disp: 60 Tab, Rfl: 3       Review of Systems  Other than ROS in the HPI, all other systems were negative.    Objective:     BP (!) 144/81   Pulse 89   Ht 1.651 m (5\' 5" )   Wt 95.7 kg (210 lb 15.7 oz)   SpO2 94%   BMI 35.11 kg/m              Physical Exam:  General appearance: Appears healthy.  Alert; in no acute distress.  Pleasant.  EENT:  Non-icteric, no exudate or drainage  Neck:  No JVD or bruits  Cardiac: PMI normal. No lifts, heaves, or thrills. RRR. No murmurs, clicks, gallops, or rubs    Sternum stable   Respiratory: Respiratory effort normal and clear to auscultation  Abdomen: soft, non-tender. Bowel sounds normal. No masses,  no organomegaly  Extremities: no cyanosis, clubbing or edema present    Neuro:  No acute focal deficits appreciated    Data Review    CT chest reviewed by Dr. Lacinda Axon    CT CHEST WO IV CONTRAST performed on 12/01/2018 2:17 PM.    REASON FOR EXAM:  I71.2: Ascending aortic aneurysm (CMS HCC)    RADIATION DOSE: 359.90 mGycm    COMPARISON: 11/25/2017    FINDINGS:    LUNGS: Mild to moderate emphysematous changes are seen bilaterally. There  is no focal consolidation, pleural effusion, or pneumothorax., Specimen the  dependent portion of the lungs likely represents atelectasis. A nodule  along the oblique fissure on series 3 image 183, measuring 4 x 7 mm. This  is unchanged from the previous exam. A tiny left apical nodule on series 3  image 76 measures 3 mm. A small nodular opacity in the trachea on series 3  image 65, likely representing secretions.    HEART/MEDIASTINUM: The heart is normal in size no pericardial effusion.  There is extensive aortic valvular calcification. Mild mitral annular  calcification is also seen at the posterior/inferior aspect of the annulus.  Mild coronary artery calcification is noted. There is no mediastinal or  hilar adenopathy or mass. The thyroid is unremarkable. The ascending  thoracic aorta is mildly enlarged, measuring up to 38 mm.    ABDOMEN: A low attenuating thickening of the left adrenal gland has  attenuation  characteristics of a benign adenoma.    OTHER: Mild degenerative changes are seen in the spine.    IMPRESSION:  1.  Mild to moderate emphysematous changes.  2.  Severe  aortic valvular calcification, with mild enlargement of the  ascending aorta, measuring up to 38 mm.  3.  Mild coronary artery calcification        TTE reviewed by Dr. Lacinda Axon     Conclusions:  1. Normal left ventricular size. Normal left ventricular ejection fraction. LV Ejection Fraction is 61 %.  Concentric hypertrophy. Cannot determine LA pressure and diastolic dysfunction grade.  2. Resting Segmental Wall Motion Analysis: Total wall motion score is 1.00. There are no regional wall  motion abnormalities.  3. Normal right ventricular size. Normal right ventricular systolic function. RV systolic pressure could not  be determined due to the lack of a tricuspid regurgitation Doppler signal.  4. No aortic regurgitation seen. Severe aortic valve stenosis. AV Peak Vel = 550.9 cm/sec. AV Mean PG =  79.7 mmHg. AVA VTI = 0.6 cm2. LVOT/AV VTI = 0.21. SI LVOT = 38.0.  Findings:  Left Ventricle: Normal left ventricular size. Normal left ventricular ejection fraction. LV Ejection Fraction is 61  %. Concentric hypertrophy. Cannot determine LA pressure and diastolic dysfunction grade.  Resting Segmental Wall Motion Analysis: Total wall motion score is 1.00. There are no regional wall motion  abnormalities.  Right Ventricle: Normal right ventricular size. Normal right ventricular systolic function. RV systolic pressure could  not be determined due to the lack of a tricuspid regurgitation Doppler signal.  Left Atrium: The left atrium is normal in size.  Right Atrium: The right atrium is normal in size.  Mitral Valve: Normal mitral valve.  Aortic Valve: The aortic valve is not well visualized. No aortic regurgitation seen. Aortic cusps appear moderately  calcified. Severe aortic valve stenosis.  Tricuspid Valve: Normal tricuspid valve.  Pulmonic Valve: The pulmonic valve is not well visualized.  Pericardium: Normal pericardium with no pericardial effusion.  Aorta: The aortic root is normal in size. There is mild ascending aorta  dilation. Consider CT or MRI to further assess  aorta.  IVC: The inferior vena cava is of normal size. The inferior vena cava demonstrates less than 50% collapse consistent  with elevated right atrial pressure (8 mmHg).        Assessment:     [name] is a 63 year old caucasian female who presents to our clinic for further surveillance of her ascending aortic aneurysm and aortic stenosis.  She has a past medical history of known aortic stenosis, HTN, emphysema, former smoker, arthritis, S/P L THA and hernia repair.  She was seen in clinic last on 11/25/17.  She is here for her 1 year follow up.  She had a CT chest on 11/25/17 that revealed ascending aortic aneurysm of 4.0.  TTE on 11/25/17 revealed EF of 55%, no regional wall abnormalities, mild AI, mild MR and severe AS (mean of 43.8, VMAX of 400.8, AVA of 0.8).  Her last heart cath on 03/26/16 revealed mild, non obstructive coronary artery disease.  Today her TTE revealed severe AS (mean of 79.7, VMAX 550.9 and AVA of 0.6).  CT scan reveals ascending aorta dilation of approximately 4.0-4.1 cm.         STS risk score for AVR is as follows:  Risk of Mortality is 1.603% and Morbidity or Mortality is 10.698% and for AVR plus CAB is 2.334% and 14.812%    Plan:  Dr. Lacinda Axon discussed with the patient her diagnosis, testing results and treatment options.  Dr. Lacinda Axon expressed his utmost concern of her critical AS and that surgical intervention is absolutely needed.  Dr. Lacinda Axon recommends surgical AVR due to patient's low age and low risk.  However patient is very fearful of undergoing open heart surgery and is very interested in TAVR.  Dr. Lacinda Axon would like to get a repeat heart cath and then present patient at our structural heart conference to gather our colleagues opinions.  A surgical consent was NOT obtained.  She was advised if she develops any new or worsening symptoms to call 911 or go to local ED right away.  She verbalized understanding and is agreeable to plan.       Dalbert Batman, APRN 12/01/2018, 15:57

## 2018-12-06 ENCOUNTER — Encounter (HOSPITAL_COMMUNITY): Admission: RE | Disposition: A | Discharge status: Home / Self Care | Payer: Self-pay | Source: Home / Self Care

## 2018-12-06 ENCOUNTER — Other Ambulatory Visit: Payer: Self-pay

## 2018-12-06 ENCOUNTER — Inpatient Hospital Stay
Admission: RE | Admit: 2018-12-06 | Discharge: 2018-12-06 | Disposition: A | Discharge status: Home / Self Care | Payer: 59 | Attending: Nurse Practitioner | Admitting: Nurse Practitioner

## 2018-12-06 ENCOUNTER — Encounter (HOSPITAL_COMMUNITY): Payer: Self-pay

## 2018-12-06 DIAGNOSIS — Z79899 Other long term (current) drug therapy: Secondary | ICD-10-CM | POA: Insufficient documentation

## 2018-12-06 DIAGNOSIS — G8929 Other chronic pain: Secondary | ICD-10-CM | POA: Insufficient documentation

## 2018-12-06 DIAGNOSIS — I4519 Other right bundle-branch block: Secondary | ICD-10-CM | POA: Insufficient documentation

## 2018-12-06 DIAGNOSIS — I712 Thoracic aortic aneurysm, without rupture: Secondary | ICD-10-CM | POA: Insufficient documentation

## 2018-12-06 DIAGNOSIS — I350 Nonrheumatic aortic (valve) stenosis: Principal | ICD-10-CM | POA: Insufficient documentation

## 2018-12-06 DIAGNOSIS — I10 Essential (primary) hypertension: Secondary | ICD-10-CM | POA: Insufficient documentation

## 2018-12-06 DIAGNOSIS — Z01810 Encounter for preprocedural cardiovascular examination: Secondary | ICD-10-CM

## 2018-12-06 DIAGNOSIS — Z87891 Personal history of nicotine dependence: Secondary | ICD-10-CM | POA: Insufficient documentation

## 2018-12-06 DIAGNOSIS — Z01818 Encounter for other preprocedural examination: Secondary | ICD-10-CM

## 2018-12-06 LAB — ECG 12-LEAD
Atrial Rate: 78 {beats}/min
Calculated P Axis: 64 degrees
Calculated R Axis: 24 degrees
Calculated T Axis: 32 degrees
PR Interval: 146 ms
QRS Duration: 84 ms
QT Interval: 386 ms
QTC Calculation: 440 ms
Ventricular rate: 78 {beats}/min

## 2018-12-06 LAB — TYPE AND SCREEN
ABO/RH(D): O POS
ANTIBODY SCREEN: NEGATIVE

## 2018-12-06 SURGERY — CORONARY ANGIOGRAPHY
Laterality: Right

## 2018-12-06 MED ORDER — HEPARIN (PORCINE) 1,000 UNIT/ML INJECTION SOLUTION
INTRAMUSCULAR | Status: AC
Start: 2018-12-06 — End: 2018-12-06
  Filled 2018-12-06: qty 10

## 2018-12-06 MED ORDER — FENTANYL (PF) 50 MCG/ML INJECTION SOLUTION
INTRAMUSCULAR | Status: AC
Start: 2018-12-06 — End: 2018-12-06
  Filled 2018-12-06: qty 2

## 2018-12-06 MED ORDER — VERAPAMIL 2.5 MG/ML INTRAVENOUS SOLUTION
INTRAVENOUS | Status: AC
Start: 2018-12-06 — End: 2018-12-06
  Filled 2018-12-06: qty 2

## 2018-12-06 MED ORDER — MIDAZOLAM 1 MG/ML INJECTION SOLUTION
INTRAMUSCULAR | Status: AC
Start: 2018-12-06 — End: 2018-12-06
  Filled 2018-12-06: qty 4

## 2018-12-06 MED ORDER — SODIUM CHLORIDE 0.9 % INTRAVENOUS SOLUTION
INTRAVENOUS | Status: DC
Start: 2018-12-06 — End: 2018-12-06

## 2018-12-06 MED ORDER — VERAPAMIL 2.5 MG/ML INTRAVENOUS SOLUTION
Freq: Once | INTRAVENOUS | Status: DC | PRN
Start: 2018-12-06 — End: 2018-12-06
  Administered 2018-12-06: 2.5 mg via INTRA_ARTERIAL

## 2018-12-06 MED ORDER — DIPHENHYDRAMINE 50 MG/ML INJECTION SOLUTION
50.0000 mg | Freq: Once | INTRAMUSCULAR | Status: AC
Start: 2018-12-06 — End: 2018-12-06
  Administered 2018-12-06: 50 mg via INTRAVENOUS
  Filled 2018-12-06: qty 1

## 2018-12-06 MED ORDER — MIDAZOLAM 1 MG/ML INJECTION SOLUTION
Freq: Once | INTRAMUSCULAR | Status: DC | PRN
Start: 2018-12-06 — End: 2018-12-06
  Administered 2018-12-06 (×2): 2 mg via INTRAVENOUS

## 2018-12-06 MED ORDER — HEPARIN (PORCINE) (PF) 1,000 UNIT/500 ML IN 0.9 % SODIUM CHLORIDE IV
INTRAVENOUS | Status: AC
Start: 2018-12-06 — End: 2018-12-06
  Filled 2018-12-06: qty 2000

## 2018-12-06 MED ORDER — LIDOCAINE HCL 20 MG/ML (2 %) INJECTION SOLUTION
Freq: Once | INTRAMUSCULAR | Status: DC | PRN
Start: 2018-12-06 — End: 2018-12-06
  Administered 2018-12-06: 2 mL via INTRADERMAL

## 2018-12-06 MED ORDER — IOVERSOL 320 MG IODINE/ML INTRAVENOUS SOLUTION
Freq: Once | INTRAVENOUS | Status: DC | PRN
Start: 2018-12-06 — End: 2018-12-06
  Administered 2018-12-06: 70 mL via INTRACORONARY

## 2018-12-06 MED ORDER — HYDROCORTISONE SOD SUCCINATE 100 MG/2 ML VIAL WRAPPER
100.0000 mg | Freq: Once | INTRAMUSCULAR | Status: AC
Start: 2018-12-06 — End: 2018-12-06
  Administered 2018-12-06: 100 mg via INTRAVENOUS
  Filled 2018-12-06: qty 2

## 2018-12-06 MED ORDER — IOVERSOL 320 MG IODINE/ML INTRAVENOUS SOLUTION
INTRAVENOUS | Status: AC
Start: 2018-12-06 — End: 2018-12-06
  Filled 2018-12-06: qty 100

## 2018-12-06 MED ORDER — HEPARIN (PORCINE) 1,000 UNIT/ML INJECTION SOLUTION
Freq: Once | INTRAMUSCULAR | Status: DC | PRN
Start: 2018-12-06 — End: 2018-12-06
  Administered 2018-12-06: 4000 [IU] via INTRA_ARTERIAL

## 2018-12-06 MED ORDER — LIDOCAINE HCL 20 MG/ML (2 %) INJECTION SOLUTION
INTRAMUSCULAR | Status: AC
Start: 2018-12-06 — End: 2018-12-06
  Filled 2018-12-06: qty 20

## 2018-12-06 MED ORDER — FAMOTIDINE (PF) 20 MG/2 ML INTRAVENOUS SOLUTION
20.0000 mg | Freq: Once | INTRAVENOUS | Status: AC
Start: 2018-12-06 — End: 2018-12-06
  Administered 2018-12-06: 20 mg via INTRAVENOUS
  Filled 2018-12-06: qty 2

## 2018-12-06 MED ORDER — FENTANYL (PF) 50 MCG/ML INJECTION SOLUTION
Freq: Once | INTRAMUSCULAR | Status: DC | PRN
Start: 2018-12-06 — End: 2018-12-06
  Administered 2018-12-06 (×2): 50 ug via INTRAVENOUS

## 2018-12-06 MED ORDER — IOVERSOL 320 MG IODINE/ML INTRAVENOUS SOLUTION
INTRAVENOUS | Status: AC
Start: 2018-12-06 — End: 2018-12-06
  Filled 2018-12-06: qty 50

## 2018-12-06 MED ADMIN — lidocaine HCL 20 mg/mL (2 %) injection solution: INTRADERMAL | @ 08:00:00

## 2018-12-06 SURGICAL SUPPLY — 12 items
CATH ANGIO 5FR RADIAL TIG 4 CURVE 100CM OPTITORQUE LRG LUM SH 2 BRD SFT TIP COR SS NYL POLYUR (CARDIAC) ×2 IMPLANT
CATH ANGIO 5FR RADL TIG 4 CURV_E 100CM OPTITORQUE LRG LUM SH (CARDIAC) ×1
DISCONTINUED NO SUB - GW .035IN 260CM FIX COR VAS 3MM J CURVE STRL LF  DISP ANGIO (WIRE) ×2 IMPLANT
DISCONTINUED USE 338553 - PACK SURG ANGIO STRL DISP LTX (CARDIAC) ×2 IMPLANT
ELECTRODE DEFIBR QCMBO 59-95F TEMP CONTROL ADULT (Electrical Supplies) ×2 IMPLANT
ELECTRODE DEFIBR RD 24IN EDGE_SYS QCMB LEADWIRE ADLT LP12 LF (Electrical Supplies) ×1
GW .035IN 6CM 260CM STD TIP FI_COR EXCH WRE PTFE VAS 3MM (WIRE) ×1
KIT INTROD 10CM 6FR 22GA GLIDESHEATH SLNDR .021IN PLASTIC SHEATH DIL 2 WL PNCT SHORT ANG MINIWIRE 45 (GUIDING) ×2 IMPLANT
KIT INTROD 10CM 6FR 22GA GLIDE_SHEATH SLNDR .021IN PLASTIC (GUIDING) ×1
KIT LEFT HEART MANIFOLD 4 PORT (MISCELLANEOUS PT CARE ITEMS) ×1
KIT SURG LFT HRT STRL DISP RUBY MEM LF (MISCELLANEOUS PT CARE ITEMS) ×2 IMPLANT
PACK CATHETERIZATION CUSTOM_5EA/CS (CARDIAC) ×1

## 2018-12-06 NOTE — Brief Op Note (Signed)
[facility]  Cardiac Catheterization Brief  Note    PATIENT NAME:  [facility] NUMBER:  [id number]  DATE OF BIRTH:  1956-01-08  DATE OF SERVICE:  12/06/18    PRIMARY CARE PROVIDER:  Derek Jack, APRN,FNP-BC      INDICATION :  62 year old female with a recent diagnosis of severe aortic stenosis and thoracic aortic aneurysm with ongoing plans for surgical intervention.  The patient presents today for her diagnostic cardiac catheterization to aid in the planning for her surgical intervention.    NAME OF PROCEDURES:  Selective coronary angiography.    ACCESS:  6 French right radial artery access.    OPERATORS: Kirandeep Fariss, RAMESHStaff) [name], MD (Fellow).    COMPLICATIONS: None    IMPRESSION:  1. Normal epicardial coronary arteries.  2. Codominant coronary circulation.    RECOMMENDATIONS:  The patient should undergo standard postprocedure care in HVI pre-post area  The patient should be discharged home later today when criteria met.   Follow-up with cardiothoracic surgery as outpatient.    [name], MD  Fellow, Division of Cardiology   [facility]      See fellow's note for details. I saw and evaluated the patient.  I agree with the fellow's description of the procedure, findings, impressions, and recommendations. I was present for the surgical procedure and otherwise immediately available for the duration of the procedure.  I have reviewed the images and confirmed or revised the interpretation as documented by the fellow.  Any exceptions are noted. I was present and supervised all procedures performed and participated in all critical aspects of these procedures.      [name], MD 12/06/2018 12:22

## 2018-12-06 NOTE — Discharge Instructions (Signed)
Discharge Instructions after Your Cardiac Catheterization with Radial Approach      Activity  For the next 24 hours  • You may feel sleepy.  Do not drive or operate machinery or power tools.  (You must have someone take you home)  • Do not drink any alcoholic beverages  • Do not make any important decisions or sign any legal documents  • Avoid any pushing or pulling motions with affected wrist for 3 days  • Do not lift with the affected arm for 48 hours  • Follow your doctors instructions about returning to work      Care of Wound  • Keep the dressing in place for 24 hours.  You may remove the dressing and shower after 24 hours.  • Cleanse the site gently with soap and water. Pat area dry. No lotions or creams.  Leave the site open to air.  • Do not get your wrist saturated for 5-7 days or until the wound is healed.      Diet  • You may resume your normal diet.  • To help eliminate contrast material from your body, we encourage you to increase your fluid intake  · If you are taking Metformin (Glucophage) hold this medication for 3 days and then resume as prescribed.    Call your doctor if you have:  • If bleeding or a hard bruise develops under the skin, apply manual pressure and call 911 immediately.  • Signs that you may be bleeding are: tightness, stinging, pain, warmness, wetness where the puncture site was put in your wrist.    • If bleeding occurs, lie down and apply firm pressure about 2 fingers above the site.  Call 911 and keep pressure on the site.  • Severe Pain, numbness, tingling, change in color or coldness of the arm used for puncture site.   • Temperature greater than 101 degrees F.  • Redness around the site.  • Drainage from the site.  Procedural Sedation  Procedural sedation is medicine to ease discomfort, pain, and anxiety during a procedure. The medicine is often given through an IV (intravenous) line in your arm or hand. In some cases, the medicine may be taken by mouth or inhaled. While  you are under sedation, you will likely be awake. But you may not remember anything afterward.   Why procedural sedation is used  Sedation is used for many types of procedures. The goal is to reduce pain, anxiety, and stressful memories of a procedure. It can help your healthcare provider treat you. For example, having a broken bone fixed may be easier if you feel relaxed.   This type of sedation is used only for short, basic procedures. It's not used for complex surgery. Some procedures that use this type of sedation include:   · Dental surgery  · Breast biopsy, to take a sample of breast tissue  · Endoscopy, to look at gastrointestinal problems  · Bronchoscopy, to check for lung problems  · Bone or joint realignment, to fix a broken bone or dislocated joint  · Minor foot or skin surgery  · Electrical cardioversion, to restore a normal heart rhythm  · Lumbar puncture, to assess neurological disease  Risks of procedural sedation  Risks and possible side effects include:   · Headache  · Nausea and vomiting  · Unpleasant memory of the procedure  · Lowered rate of breathing  · Changes in heart rate and blood pressure (rare)  · Inhalation   of stomach contents into your lungs (rare)  Side effects will likely go away shortly after the procedure. Your healthcare team will watch your heart rate and breathing during and after your sedation. This is to help prevent problems.   Your own risks may vary. They can be based on your age and your overall health. They also depend on the type of sedation you are given. Talk with your healthcare provider about the risks that apply most to you.   Getting ready for procedural sedation   Talk with your healthcare provider about how to get ready for your procedure. Tell him or her about all the medicines you take. This includes over-the-counter medicines such as ibuprofen. It also includes vitamins, herbs, and other supplements. You may need to stop taking some medicines before the  procedure, such as blood thinners and aspirin. If you smoke, you should stop. This is to lessen the chance of a lung problem. Talk with your healthcare provider if you need help to stop smoking.   Tell your healthcare provider if you:   · Have had any problems in the past with sedation or anesthesia  · Have had any recent changes in your health, such as an infection or fever  · Are pregnant or think you could be  Also:  · Ask a family member or friend to take you home after the procedure. You can’t drive on the day you have sedation.  · Follow any directions you are given for not eating or drinking before procedure.  · Don't make any important decisions, such as financial or legal, on the day after you have sedation.  · Follow all other instructions from your healthcare provider.  During your procedural sedation  You may have your procedure in a hospital or a medical clinic. Sedation is done by a trained healthcare provider. In general, you can expect the following:   · You will be given medicine through an IV line in your arm or hand. Or you may receive a shot. The medicine may also be given by mouth. Or you may inhale it through a mask.  · If you have medicine through an IV, you may feel the effects very quickly. You will start to feel relaxed and drowsy.  · During the procedure, your heart rate, breathing, and blood pressure will be closely watched. Your breathing and blood pressure may decrease a little. But you will likely not need help with your breathing. You may receive a little extra oxygen. This is done through a mask or some soft plastic prongs under your nose.  · You will probably be awake the entire time. If you do fall asleep, you should be easy to wake up, if needed. You should feel little or no pain.  · When your procedure is over, the sedative medicine will be stopped.  After your procedural sedation  You will begin to feel more awake and aware. But you will likely be drowsy for a while afterward.  You will be closely watched as you become more alert. You may have a faint memory of the procedure. Or you may not remember it at all.   You should be able to return home within 1 to 2 hours after your procedure. Plan to have someone stay with you for a few hours. Side effects such as headache and nausea may go away quickly. Tell your healthcare provider if they continue.   Don’t drive or make any important decisions for at least 24 hours.   Be sure to follow all after-care instructions.   When to call your healthcare provider  Have someone call your healthcare provider right away if you have any of these:   · Drowsiness that gets worse  · Weakness or dizziness that gets worse  · Repeated vomiting  · You can’t be awakened  · Severe or ongoing pain from the procedure, not relieved by the pain medicine  [employer] last reviewed this educational content on 08/19/2018  © 2000-2019 [employer] [address], [place] [address]. All rights reserved. This information is not intended as a substitute for professional medical care. Always follow your healthcare professional's instructions.

## 2018-12-06 NOTE — Sedation Documentation (Signed)
12/06/18  Procedure(s):  CORONARY ANGIOGRAPHY    Diagnosis:     Sedation Informed Consent, pre-sedation risk assessment and evaluation completed.  History of previous adverse experiences with sedation/analgesia/anesthesia assessed.  Monitored conscious sedation was administered under my direct supervision by an appropriately trained sedation nurse.  Appropriate Facility and Equipment compliant.      Procedure time out  Timeouts     Hector Brunswick, RN at Tue Dec 06, 2018 0815 EST     Timeout Details     Timeout type:  Preprocedure          Procedures     Panel 1: Right CORONARY ANGIOGRAPHY W/LEFT HEART CATH W/WO LVG with [name], MD          Timeout Questions    Correct patient? Yes  Correct site? Yes  Correct side? Yes  Correct position? Yes  Correct procedure? Yes  Site marked? Yes  H&P note completed? Yes  Consents verified? Yes  Radiology studies available? Yes  Relevant lab results available? Yes  Allergies reviewed? Yes  Are all required blood products & devices for the procedure available? Yes  Is documentation verified? Yes           Staff Present     Physicians  Mana Morison, Maren Beach, MD Staff  Hector Brunswick, RN  Ellin Saba, [facility]  Hoyt Koch, TECHNOLOGIST          Verification History     Staff Performed Verified    Hector Brunswick, RN Tue Dec 06, 2018 0815 EST Tue Dec 06, 2018 0815 EST                      Physician in  and out times  Physicians     Name Panel Role Time Period    [name], MD Panel 1 Primary 12/06/2018 0820 - 12/06/2018 9629      [name], MD Panel 1 Fellow 12/06/2018 0818 - 12/06/2018 0833      Sedation Staff     Name Type Time Period    Hector Brunswick, RN Invasive Nurse 12/06/2018 0757          Sedation and Procedure Times:  Sedation Start Time:: 5284  Sedation End/Recovery Start Time: 0831     Proc Name Event Type Event Time    CORONARY ANGIOGRAPHY  Incision Start Tue Dec 06, 2018  8:18 AM    CORONARY ANGIOGRAPHY  Incision Close Tue Dec 06, 2018  8:33 AM                 Aldrete Scores    Pre Sedation  Activity: 2-->able to move 4 extremities voluntarily or on command  Respiration: 2-->able to breathe and cough freely  Circulation: 2-->BP within 20% of pre-anesthetic level  Consciousness: 2-->fully awake  O2 Saturation: 2-->able to maintain O2 saturation greater than 92% on room air  Dressing: 2-->dry and clean or not applicable  Pain: 2-->pain free  Ambulation: 2-->able to stand up and walk straight, on ordered bedrest, or performing at previous level of functioning  Fasting/Feeding: 2-->able to drink fluids or NPO, minimal nausea/ no vomiting  Urine Output: 2-->has voided, adequate urine output per device, or not applicable  Modified Aldrete Score: 20      Post Sedation  Assessment Scored:: Post-Procedure  Activity: 2-->able to move 4 extremities voluntarily or on command  Respiration: 2-->able to breathe and cough freely  Circulation: 2-->BP within 20% of pre-anesthetic level  Consciousness: 2-->fully awake  O2 Saturation:  2-->able to maintain O2 saturation greater than 92% on room air  Dressing: 2-->dry and clean or not applicable  Pain: 2-->pain free  Ambulation: 2-->able to stand up and walk straight, on ordered bedrest, or performing at previous level of functioning  Urine Output: 2-->has voided, adequate urine output per device, or not applicable  Post Modified Aldrete Score: 20      Sedation Type: Moderate Sedation     Medications (moderate): Fentanyl, Versed  Hospital[facility]  Unit: [facility]  IV Type: Peripheral IV  Additional Intervention needed:: No             Patient was continuously monitored throughout the procedure.  Provider was in attendance throughout sedation.  See Invasive Procedure Log for additional details.    [name], MD       The patient was continuously monitored throughout the procedure and in recovery. I was in attendance and supervised the sedation (during the start and stop times listed above) and remained immediately available until  the patient returned to pre-procedure baseline.

## 2018-12-06 NOTE — H&P (Signed)
[facility]  H&P Update Form    [name], [name], 63 y.o. female  Encounter Start Date:  12/06/2018  Inpatient Admission Date:   Date of Birth:  [birth year]    12/06/2018    STOP: IF H&P IS GREATER THAN 30 DAYS FROM SURGICAL DAY COMPLETE NEW H&P IS REQUIRED.     H & P updated the day of the procedure.  1.  H&P completed within 30 days of surgical procedure by Dr. Lacinda Axon on 02/13  and has been reviewed within 24 hours of the surgery, the patient has been examined, and no change has occured in the patients condition since the H&P was completed.       Change in medications: No          Last Menstrual Period: Post-Menopausal      Comments:     2.  Patient continues to be appropiate candidate for planned surgical procedure. YES      [name] Amber, MD          12/06/2018  I saw and examined the patient.  I reviewed the fellow's note.  I agree with the findings and plan of care as documented in the fellow's note.  Any exceptions/additions are edited/noted.    [name], MD

## 2018-12-06 NOTE — Nurses Notes (Signed)
Patient returned to [facility] pre-post from procedure. Denies any concerns. Patient placed on monitor.  Pressure dressing to Right wrist CDI, no bleeding or hematoma present.   Patient educated on s/s of bleeding and bedrest precautions, verbalized understanding. Will continue to monitor.

## 2018-12-06 NOTE — Nurses Notes (Signed)
Patient able to eat and drink without difficulties. Patient able to use the restroom, and has returned to baseline ambulatory status. Gait steady without difficulties. No reports of pain noted. IV discontinued x2. Right wrist dressing CDI, no bleeding or hematoma present.  Patient and family verbalizes understanding of discharge instructions. Discharge instructions sent home with patient. Escorted to lobby via wheelchair.

## 2018-12-06 NOTE — Nurses Notes (Signed)
Patient ambulated back to [facility] pre-post. Offered restroom. Changed into gown. Instructed on use of call bell and television remote. Vitals signs obtained. Attempting IV access and lab collection at this time. Awaiting healthcare provider to consent patient. Will prep accordingly.Will continue to monitor.

## 2018-12-07 ENCOUNTER — Encounter (HOSPITAL_COMMUNITY): Payer: Self-pay | Admitting: Interventional Cardiology

## 2018-12-07 ENCOUNTER — Telehealth (HOSPITAL_COMMUNITY): Payer: Self-pay | Admitting: THORACIC SURGERY CARDIOTHORACIC VASCULAR SURGERY

## 2018-12-07 DIAGNOSIS — I350 Nonrheumatic aortic (valve) stenosis: Secondary | ICD-10-CM

## 2018-12-07 DIAGNOSIS — Z01818 Encounter for other preprocedural examination: Secondary | ICD-10-CM

## 2018-12-07 NOTE — Nursing Note (Signed)
Case/Name/MRN   [place] [id number] AS   Discussion 63 year old female --ascending aortic aneurysm and aortic stenosis.  She has PMH of known aortic stenosis, HTN, emphysema, former smoker, arthritis, S/P L THA and hernia repair.  She was seen in clinic last on 11/25/17.  She is here for her 1 year follow up.  She had a CT chest on 11/25/17 that revealed ascending aortic aneurysm of 4.0.  TTE on 11/25/17 revealed EF of 55%, no regional wall abnormalities, mild AI, mild MR and severe AS (mean of 43.8, VMAX of 400.8, AVA of 0.8).  Her last heart cath on 03/26/16 revealed mild, non obstructive coronary artery disease.  As for symptoms, patient reports only mild dyspnea on exertion for the last few months.  She reports mild dyspnea with her house work.  She reports stress and activity can worsen symptoms while rest can help alleviate them. She also notes intermittent mild dizziness the last week.  She denies any chest pain, orthopnea, palpitations, syncope or edema.         GROUP DECISION Patient discussed and images reviewed. Pt with critical AS, plan for surgical mini AVR

## 2018-12-07 NOTE — Patient Instructions (Addendum)
Patient called and relayed discussions of structural heart to return to clinic with a CT mini mitral.    Patient does have anaphylaxis reaction to IV contrast and will require pre medication.  Jed Limerick, NP and Collier Flowers, PA sent message requesting pre medication orders.      Patient does want daughter to be able to come to the appointment, so if the appointment can be scheduled earlier in the day she would prefer that.      Will discuss all pre testing required, order, and schedule.  Will discuss pre medicating for IV contrast allergy.      Patient instructed to come to emergency room with any increase in shortness of breath or chest pain.      Patient verbalizes understanding.

## 2018-12-08 ENCOUNTER — Other Ambulatory Visit (HOSPITAL_BASED_OUTPATIENT_CLINIC_OR_DEPARTMENT_OTHER): Payer: Self-pay

## 2018-12-08 ENCOUNTER — Ambulatory Visit (HOSPITAL_BASED_OUTPATIENT_CLINIC_OR_DEPARTMENT_OTHER): Payer: Self-pay | Admitting: Family

## 2018-12-08 NOTE — Telephone Encounter (Signed)
I would be willing to place the order and she can schedule in the spring if she wants to do that.   Let me know!  Thanks!  Derek Jack, APRN,FNP-BC

## 2018-12-08 NOTE — Telephone Encounter (Signed)
Spoke to patient about her positive Cologuard.  She is in the midst of scheduling robotic aortic valve replacement surgery at [facility].   She also has basal cell skin cancer on her face that she is trying to get removed.  She is unable to schedule a diagnostic colonoscopy at this time due to her upcoming urgent heart surgery.  Please advise.  She uses MyChart regularly. Kerman Passey, RN

## 2018-12-09 ENCOUNTER — Ambulatory Visit (HOSPITAL_BASED_OUTPATIENT_CLINIC_OR_DEPARTMENT_OTHER): Payer: Self-pay | Admitting: Family

## 2018-12-09 ENCOUNTER — Encounter (HOSPITAL_BASED_OUTPATIENT_CLINIC_OR_DEPARTMENT_OTHER): Payer: Self-pay

## 2018-12-09 ENCOUNTER — Telehealth (HOSPITAL_COMMUNITY): Payer: Self-pay | Admitting: THORACIC SURGERY CARDIOTHORACIC VASCULAR SURGERY

## 2018-12-09 ENCOUNTER — Ambulatory Visit (HOSPITAL_COMMUNITY): Payer: Self-pay | Admitting: THORACIC SURGERY CARDIOTHORACIC VASCULAR SURGERY

## 2018-12-09 NOTE — Telephone Encounter (Signed)
[name] called in CTA pre medication for IV Contrast allergy.

## 2018-12-09 NOTE — Telephone Encounter (Signed)
Talked to [facility] and patient cancelled MOHS for 12-14-18 and rescheduled for 03-08-2019 due to having heart surgery. [name]  12/09/2018, 10:12. [name]  12/09/2018, 10:12

## 2018-12-09 NOTE — Patient Instructions (Signed)
Called patient and PCP had discussed setting the patient up for a colonoscopy for a screening measure.    Patient has critical AS and heart surgery needs to be performed soon.  Patient verbalizes understanding.      Patient is also going to delay having a skin cancer procedure that she described as a biopsy.    Instructed the patient to call back with any questions/concerns.

## 2018-12-12 ENCOUNTER — Telehealth (HOSPITAL_COMMUNITY): Payer: Self-pay | Admitting: THORACIC SURGERY CARDIOTHORACIC VASCULAR SURGERY

## 2018-12-12 NOTE — Patient Instructions (Signed)
DO NOT consume caffeinated drinks on the day before or the day of your exam. For example: coffee, tea, energy drinks, or caffeinated sodas.  *DO NOT consume energy or diet pills on the day before or the day of your exam. (ask your doctor if you have questions)  * Do not use Viagra of any similar medication 48 hours prior to exam appointment. It is not compatible with the medications you will receive during the procedure. (ask your doctor if you have questions)  * DO eat and drink normally up to 4 hours prior to the exam. Clear liquids may be consumed during this 4 hours.  * DO drink plenty of water 24 hours before and after the appointment.  * DO take your prescribed medications.  * Arrive in the Radiology department 1 hour before your appointment time and plan to be in our department for 2 hours.*You MUST go to registration on the 1st floor prior to coming to the radiology department.      Reviewed CT instructions. Lonna Duval, NP called in pre contrast medication for allergies.    Confirmed CT and appointments for 12/29/18.        CT instructions also to be mailed out.

## 2018-12-20 ENCOUNTER — Other Ambulatory Visit: Payer: Self-pay

## 2018-12-20 ENCOUNTER — Encounter (HOSPITAL_BASED_OUTPATIENT_CLINIC_OR_DEPARTMENT_OTHER): Payer: Self-pay | Admitting: Family

## 2018-12-20 ENCOUNTER — Ambulatory Visit: Payer: 59 | Attending: Family | Admitting: Family

## 2018-12-20 VITALS — BP 130/76 | HR 80 | Temp 97.1°F | Ht 65.0 in | Wt 209.9 lb

## 2018-12-20 DIAGNOSIS — Z79899 Other long term (current) drug therapy: Secondary | ICD-10-CM | POA: Insufficient documentation

## 2018-12-20 DIAGNOSIS — Z96649 Presence of unspecified artificial hip joint: Secondary | ICD-10-CM | POA: Insufficient documentation

## 2018-12-20 DIAGNOSIS — I350 Nonrheumatic aortic (valve) stenosis: Secondary | ICD-10-CM | POA: Insufficient documentation

## 2018-12-20 DIAGNOSIS — Z7951 Long term (current) use of inhaled steroids: Secondary | ICD-10-CM | POA: Insufficient documentation

## 2018-12-20 DIAGNOSIS — Z1239 Encounter for other screening for malignant neoplasm of breast: Secondary | ICD-10-CM

## 2018-12-20 DIAGNOSIS — Z791 Long term (current) use of non-steroidal anti-inflammatories (NSAID): Secondary | ICD-10-CM | POA: Insufficient documentation

## 2018-12-20 DIAGNOSIS — I10 Essential (primary) hypertension: Secondary | ICD-10-CM | POA: Insufficient documentation

## 2018-12-20 NOTE — Progress Notes (Signed)
Subjective:   [name] is a 63 y.o. female with hypertension.  Hypertension treatment  Patient presents today for follow-up of HCTZ, this was restarted 6 weeks ago.   Pertinent history:taking medications as instructed, no medication side effects noted, home BP monitoring in range of 244-010U systolic over 72Z diastolic, no TIAs, no chest pain on exertion, no dyspnea on exertion, no swelling of ankles    Patient was diagnosed with aortic stenosis during a pre-op visit for hip replacement. She had a follow-up recently with cardiology and her stenosis is bad enough now, she needs heart valve replacement. She has her visit with them on 12/29/2018. Denies SOB or dizziness.     Doing well in term of hip replacement.     Lab Results   Component Value Date    SODIUM 132 (L) 12/01/2018    POTASSIUM 4.1 12/01/2018    CHLORIDE 95 (L) 12/01/2018    CO2 27 12/01/2018    ANIONGAP 10 12/01/2018    BUN 15 12/01/2018    CREATININE 0.82 12/01/2018    BUNCRRATIO 18 12/01/2018    GFR >60 12/01/2018    GLUCOSENF 92 12/01/2018    CHOLESTEROL 195 11/21/2018    HDLCHOL 73 11/21/2018    LDLCHOL 106 (H) 11/21/2018    TRIG 78 11/21/2018     Medication list:  acetaminophen (TYLENOL) 500 mg Oral Tablet, Take 500 mg by mouth Every 4 hours as needed for Pain  CANNABIDIOL, CBD, EXTRACT ORAL, Take 15 mg by mouth Pt taking 1 caramel with 15 mg CBD extract.  clobetasoL (CORMAX) 0.05 % Solution, by Apply Topically route Twice daily  clobetasoL (TEMOVATE) 0.05 % Cream, by Apply Topically route Twice daily  fluticasone (FLONASE) 50 mcg/actuation Nasal Spray, Suspension, 1 Spray by Each Nostril route Once a day  hydroCHLOROthiazide (HYDRODIURIL) 25 mg Oral Tablet, Take 1 Tab (25 mg total) by mouth Once a day  loratadine (CLARITIN) 10 mg Oral Tablet, Take 10 mg by mouth Once a day  multivitamin Oral Tablet, Take 1 Tab by mouth Once a day  naproxen (NAPROSYN) 500 mg Oral Tablet, Take 1 Tab (500 mg total) by mouth Twice daily with food    No  facility-administered medications prior to visit.      Review of Systems - as per HPI.     OBJECTIVE:  BP 130/76   Pulse 80   Temp 36.2 C (97.1 F) (Temporal)   Ht 1.651 m (5\' 5" )   Wt 95.2 kg (209 lb 14.1 oz)   BMI 34.93 kg/m      Body mass index is 34.93 kg/m.  BP Readings from Last 3 Encounters:   12/20/18 130/76   12/06/18 (!) 145/88   12/01/18 (!) 144/81     Wt Readings from Last 3 Encounters:   12/20/18 95.2 kg (209 lb 14.1 oz)   12/06/18 95.2 kg (209 lb 14.1 oz)   12/01/18 95.7 kg (210 lb 15.7 oz)     General appearance: in no apparent distress and well developed and well nourished  General exam BP noted to be well controlled today in office, S1, S2 normal, no gallop, no murmur, chest clear, no JVD, no HSM, no edema.     ASSESSMENT:  PLAN:    ICD-10-CM    1. Essential hypertension I10    2. Screening for breast cancer Z12.39 MAMMO BILATERAL SCREENING-ADDL VIEWS/BREAST US AS REQ BY RAD   3. Severe aortic stenosis I35.0      HTN: improved with HCTZ  Will be having aortic valve replacement soon. Advised to keep follow-up as directed by cards.   Return in about 6 months (around 06/22/2019) for HTN follow-up.   Patient was seen independently, co-signing physician on site if consultation was needed.    Derek Jack, APRN,FNP-BC  [facility], [place]  Operated by [facility]  [address]  [place] [address]  Dept: [phone]

## 2018-12-21 ENCOUNTER — Encounter (HOSPITAL_COMMUNITY): Payer: Self-pay | Admitting: Nurse Practitioner

## 2018-12-21 ENCOUNTER — Other Ambulatory Visit (HOSPITAL_BASED_OUTPATIENT_CLINIC_OR_DEPARTMENT_OTHER): Payer: Self-pay | Admitting: Family

## 2018-12-21 DIAGNOSIS — J0100 Acute maxillary sinusitis, unspecified: Secondary | ICD-10-CM

## 2018-12-21 MED ORDER — FLUTICASONE PROPIONATE 50 MCG/ACTUATION NASAL SPRAY,SUSPENSION
1.0000 | Freq: Every day | NASAL | 0 refills | Status: DC
Start: 2018-12-21 — End: 2020-01-09

## 2018-12-21 NOTE — Patient Instructions (Signed)
Patient called in and wanted to discuss valve types prior to her appointment with Dr. Lacinda Axon next Thursday.  We went over tissue and mechanical valves.  She will further think about the valve types and talk with Dr. Lacinda Axon next week.

## 2018-12-21 NOTE — Nursing Note (Signed)
Refill Request Note: [place]    Patient requests refill on listed medications.  Last visit at [facility]:  12/20/2018  Next pending visit [facility]: 06/22/2019    Medication pended to provider for approval.    Deja Kaigler, MA 12/21/2018, 10:12

## 2018-12-22 ENCOUNTER — Encounter (HOSPITAL_COMMUNITY): Payer: Self-pay | Admitting: Nurse Practitioner

## 2018-12-22 ENCOUNTER — Telehealth (HOSPITAL_COMMUNITY): Payer: Self-pay | Admitting: Nurse Practitioner

## 2018-12-22 NOTE — Patient Instructions (Signed)
Patient has history of anaphylaxis with IV contrast.  Spoke to CT nurse and Dr. Lacinda Axon.  Benefits do not outweigh the risks.  We will cancel scan.  Spoke with patient who agrees and we discussed changing approach to mini AVR (mini sternotomy).  Patient is agreeable with plan and we will see her in clinic on Thursday.

## 2018-12-22 NOTE — Progress Notes (Signed)
 Care Team:     Primary Care Providers:  Derek Jack, APRN,FNP-BC (General)    Referring Provider:  Joaquin Bend    Subjective:     [name] is a 63 year old caucasian female who presents to our clinic for further surveillance of her ascending aortic aneurysm and aortic stenosis.  She has a past medical history of known aortic stenosis, HTN, emphysema, former smoker, arthritis, S/P L THA and hernia repair.   She had a CT chest on 11/25/17 that revealed ascending aortic aneurysm of 4.0.  TTE on 11/25/17 revealed EF of 55%, no regional wall abnormalities, mild AI, mild MR and severe AS (mean of 43.8, VMAX of 400.8, AVA of 0.8).  She seen [place] on 12/01/18 for her 1 year follow up when her TTE revealed critical AS (mean of 79.7, VMAX 550.9 and AVA of 0.6).  CT scan revealed ascending aorta dilation of approximately 4.0-4.1 cm.  Cardiac cath on 12/06/18 revealed normal coronary arteries.   As for symptoms, patient reports only mild dyspnea on exertion for the last few months.  She reports mild dyspnea with her house work.  She reports stress and activity can worsen symptoms while rest can help alleviate them. She also notes intermittent mild dizziness.  She denies any chest pain, orthopnea, palpitations, syncope or edema.    Patient Active Problem List    Diagnosis Date Noted   . Aortic stenosis 12/01/2018   . Severe aortic stenosis 11/08/2018   . Low back pain radiating to left leg 12/02/2016   . S/P hip replacement 08/13/2016   . Essential hypertension 02/12/2016     Past Medical History:   Diagnosis Date   . Aortic stenosis    . Arthritis    . Chronic pain    . Heart murmur     echo 02/2016, aortic stenosis   . Heartburn    . High blood pressure    . Nausea with vomiting    . Valvular disease     aortic valve stenosis, following with CT surgery, echo 02/2016 and cath 03/2016          Past Surgical History:   Procedure Laterality Date   . HX CESAREAN SECTION  1984   . HX HERNIA REPAIR  2004, 2006, 2008      x3   . HX HIP REPLACEMENT Left 08/11/2016   . HX HYSTERECTOMY  2003            Allergies   Allergen Reactions   . Iv Contrast Anaphylaxis   . Oregano      Hives     . Stadol [Butorphanol Tartrate] Mental Status Effect   . Tomato Hives/ Urticaria      Social History     Tobacco Use   . Smoking status: Former Smoker     Packs/day: 1.00     Years: 35.00     Pack years: 35.00   . Smokeless tobacco: Never Used   . Tobacco comment: quit 2014   Substance Use Topics   . Alcohol use: Yes     Comment: social only      Family Medical History:     Problem Relation (Age of Onset)    Atrial fibrillation Sister    Heart Disease Mother    Hypertension (High Blood Pressure) Father    Lung Cancer Father, Brother    Mitral valve prolapse Sister    Sudden Death no cause Mother  Current Outpatient Medications:   .  acetaminophen (TYLENOL) 500 mg Oral Tablet, Take 500 mg by mouth Every 4 hours as needed for Pain, Disp: , Rfl:   .  CANNABIDIOL, CBD, EXTRACT ORAL, Take 15 mg by mouth Pt taking 1 caramel with 15 mg CBD extract., Disp: , Rfl:   .  clobetasoL (CORMAX) 0.05 % Solution, by Apply Topically route Twice daily, Disp: 1 Bottle, Rfl: 1  .  clobetasoL (TEMOVATE) 0.05 % Cream, by Apply Topically route Twice daily, Disp: 1 Tube, Rfl: 1  .  fluticasone propionate (FLONASE) 50 mcg/actuation Nasal Spray, Suspension, 1 Spray by Each Nostril route Once a day, Disp: 3 Bottle, Rfl: 0  .  hydroCHLOROthiazide (HYDRODIURIL) 25 mg Oral Tablet, Take 1 Tab (25 mg total) by mouth Once a day, Disp: 90 Tab, Rfl: 4  .  loratadine (CLARITIN) 10 mg Oral Tablet, Take 10 mg by mouth Once a day, Disp: , Rfl:   .  multivitamin Oral Tablet, Take 1 Tab by mouth Once a day, Disp: , Rfl:   .  naproxen (NAPROSYN) 500 mg Oral Tablet, Take 1 Tab (500 mg total) by mouth Twice daily with food, Disp: 60 Tab, Rfl: 3       Review of Systems  Other than ROS in the HPI, all other systems were negative.    Objective:     BP 138/85   Pulse 80   Temp 36.9 C  (98.4 F) (Thermal Scan)   Ht 1.651 m (5\' 5" )   Wt 96.5 kg (212 lb 11.9 oz)   SpO2 96%   BMI 35.40 kg/m       Physical Exam:  General appearance: Appears healthy.  Alert; in no acute distress.  Pleasant.  EENT:  Non-icteric, no exudate or drainage  Neck:  No JVD or bruits  Cardiac: PMI normal. No lifts, heaves, or thrills. RRR. No clicks, gallops, or rubs, positive grade IV systolic murmur   Sternum stable   Respiratory: Respiratory effort normal and clear to auscultation  Abdomen: soft, non-tender. Bowel sounds normal. No masses,  no organomegaly  Extremities: no cyanosis, clubbing or edema present    Neuro:  No acute focal deficits appreciated        Data Review    No new testing      Assessment:     [name] is a 63 year old caucasian female who presents to our clinic for further surveillance of her ascending aortic aneurysm and critical aortic stenosis.  She has a past medical history of known aortic stenosis, HTN, emphysema, former smoker, arthritis, S/P L THA and hernia repair.   She had a CT chest on 11/25/17 that revealed ascending aortic aneurysm of 4.0.  TTE on 11/25/17 revealed EF of 55%, no regional wall abnormalities, mild AI, mild MR and severe AS (mean of 43.8, VMAX of 400.8, AVA of 0.8).  She seen [place] on 12/01/18 for her 1 year follow up when her TTE revealed critical AS (mean of 79.7, VMAX 550.9 and AVA of 0.6).  CT scan revealed ascending aorta dilation of approximately 4.0-4.1 cm.  Cardiac cath on 12/06/18 revealed normal coronary arteries.  Pertinent records and imaging reviewed by Dr. Lacinda Axon     STS risk score for AVR is as follows:  Risk of Mortality is 1.603% and Morbidity or Mortality is 10.698%     Plan:     Dr. Lacinda Axon discussed with the patient her diagnosis, testing results and treatment options.  Dr. Lacinda Axon  recommends mini-sternotomy, aortic valve replacement.  The surgery, risks, benefits, alternatives, STS risk scores, and valve types were discussed with the patient and she elects  to proceed with surgery.  She is unsure of which valve type after extensive discussion.  She will let us know.  A surgical consent was obtained.  She has obtained dental clearance and will need PAT.  She verbalized understanding and is agreeable to plan.  She was advised if she develops any new or worsening symptoms to call 911 or go to local ED right away     Dalbert Batman, APRN 12/29/2018, 13:22     The patient was seen and examined.  All pertinent studies including the cardiac catheterization and echo were reviewed.  The patient has symptomatic aortic stenosis with low STS risk for surgical AVR.  I discussed the alternatives, risks, benefits, operative details, anticipated hospital course, and expected recovery through to the follow up appointment and cardiac rehab with the patient and family.  I reviewed the risks of death, bleeding, blood transfusion, return to OR, stroke, MI, pneumonia, wound infection, renal failure, need for dialysis, need for prolonged ventilation, need to drain effusions, up to 33% risk of postoperative atrial fibrillation, and the possible need for pacemaker following AVR.  The patient's estimated preoperative STS risk for mortality and major morbidity were reviewed.  We also discussed the valve choice and the risks of bioprosthetic valve degeneration versus the implications of lifelong anticoagulation with mechanical AVR.  Given the patient's age, desires, and risk factors will plan for mechanical valve.  The patient expressed complete understanding and consented to proceed with surgery.  All questions were answered.     C. Lacinda Axon, MD

## 2018-12-29 ENCOUNTER — Ambulatory Visit (HOSPITAL_BASED_OUTPATIENT_CLINIC_OR_DEPARTMENT_OTHER)
Admission: RE | Admit: 2018-12-29 | Discharge: 2018-12-29 | Disposition: A | Discharge status: Home / Self Care | Payer: 59 | Source: Ambulatory Visit

## 2018-12-29 ENCOUNTER — Ambulatory Visit (HOSPITAL_BASED_OUTPATIENT_CLINIC_OR_DEPARTMENT_OTHER): Payer: 59 | Admitting: THORACIC SURGERY CARDIOTHORACIC VASCULAR SURGERY

## 2018-12-29 ENCOUNTER — Ambulatory Visit (HOSPITAL_COMMUNITY)
Admission: RE | Admit: 2018-12-29 | Discharge: 2018-12-29 | Disposition: A | Discharge status: Home / Self Care | Payer: 59 | Source: Ambulatory Visit

## 2018-12-29 ENCOUNTER — Encounter (HOSPITAL_COMMUNITY): Payer: Self-pay | Admitting: THORACIC SURGERY CARDIOTHORACIC VASCULAR SURGERY

## 2018-12-29 ENCOUNTER — Encounter (HOSPITAL_COMMUNITY): Payer: Self-pay

## 2018-12-29 ENCOUNTER — Other Ambulatory Visit (HOSPITAL_COMMUNITY): Payer: Self-pay

## 2018-12-29 ENCOUNTER — Other Ambulatory Visit: Payer: Self-pay

## 2018-12-29 ENCOUNTER — Ambulatory Visit
Admission: RE | Admit: 2018-12-29 | Discharge: 2018-12-29 | Disposition: A | Discharge status: Home / Self Care | Payer: 59 | Source: Ambulatory Visit | Attending: THORACIC SURGERY CARDIOTHORACIC VASCULAR SURGERY | Admitting: THORACIC SURGERY CARDIOTHORACIC VASCULAR SURGERY

## 2018-12-29 VITALS — BP 138/85 | HR 80 | Temp 98.4°F | Ht 65.0 in | Wt 212.7 lb

## 2018-12-29 DIAGNOSIS — I10 Essential (primary) hypertension: Secondary | ICD-10-CM | POA: Insufficient documentation

## 2018-12-29 DIAGNOSIS — I712 Thoracic aortic aneurysm, without rupture: Secondary | ICD-10-CM | POA: Insufficient documentation

## 2018-12-29 DIAGNOSIS — I714 Abdominal aortic aneurysm, without rupture: Secondary | ICD-10-CM

## 2018-12-29 DIAGNOSIS — I350 Nonrheumatic aortic (valve) stenosis: Principal | ICD-10-CM | POA: Insufficient documentation

## 2018-12-29 DIAGNOSIS — Z87891 Personal history of nicotine dependence: Secondary | ICD-10-CM | POA: Insufficient documentation

## 2018-12-29 DIAGNOSIS — Z01818 Encounter for other preprocedural examination: Secondary | ICD-10-CM

## 2018-12-29 DIAGNOSIS — J439 Emphysema, unspecified: Secondary | ICD-10-CM | POA: Insufficient documentation

## 2018-12-29 DIAGNOSIS — M1990 Unspecified osteoarthritis, unspecified site: Secondary | ICD-10-CM | POA: Insufficient documentation

## 2018-12-29 DIAGNOSIS — Z96642 Presence of left artificial hip joint: Secondary | ICD-10-CM | POA: Insufficient documentation

## 2018-12-29 HISTORY — DX: Presence of spectacles and contact lenses: Z97.3

## 2018-12-29 HISTORY — DX: Malignant (primary) neoplasm, unspecified: C80.1

## 2018-12-29 HISTORY — DX: Motion sickness, initial encounter: T75.3XXA

## 2018-12-29 HISTORY — DX: Polyp of colon: K63.5

## 2018-12-29 HISTORY — DX: Melena: K92.1

## 2018-12-29 HISTORY — DX: Obesity, unspecified: E66.9

## 2018-12-29 LAB — BASIC METABOLIC PANEL
ANION GAP: 12 mmol/L (ref 4–13)
BUN/CREA RATIO: 16 (ref 6–22)
BUN: 12 mg/dL (ref 8–25)
CALCIUM: 9.2 mg/dL (ref 8.5–10.2)
CHLORIDE: 94 mmol/L — ABNORMAL LOW (ref 96–111)
CO2 TOTAL: 25 mmol/L (ref 22–32)
CREATININE: 0.77 mg/dL (ref 0.49–1.10)
ESTIMATED GFR: >60 mL/min/1.73mˆ2 (ref >60)
GLUCOSE: 87 mg/dL (ref 65–139)
POTASSIUM: 3.6 mmol/L (ref 3.5–5.1)
SODIUM: 131 mmol/L — ABNORMAL LOW (ref 136–145)

## 2018-12-29 LAB — CBC
HCT: 42.7 % (ref 34.8–46.0)
HGB: 14.2 g/dL (ref 11.5–16.0)
MCH: 30 pg (ref 26.0–32.0)
MCHC: 33.3 g/dL (ref 31.0–35.5)
MCV: 90.3 fL (ref 78.0–100.0)
MPV: 8.8 fL (ref 8.7–12.5)
PLATELETS: 260 x10ˆ3/uL (ref 150–400)
RBC: 4.73 x10ˆ6/uL (ref 3.85–5.22)
RDW-CV: 11.5 % (ref 11.5–15.5)
WBC: 4.7 x10ˆ3/uL (ref 3.7–11.0)

## 2018-12-29 LAB — URINALYSIS, MICROSCOPIC
RBCS: 1 /HPF (ref <6.0)
WBCS: 1 /HPF (ref <11.0)

## 2018-12-29 LAB — ECG 12-LEAD
Atrial Rate: 72 {beats}/min
Calculated P Axis: 59 degrees
Calculated R Axis: 32 degrees
Calculated T Axis: 53 degrees
PR Interval: 142 ms
QRS Duration: 90 ms
QT Interval: 416 ms
QTC Calculation: 455 ms
Ventricular rate: 72 {beats}/min

## 2018-12-29 LAB — HEPATIC FUNCTION PANEL
ALBUMIN: 4.5 g/dL (ref 3.4–4.8)
ALKALINE PHOSPHATASE: 85 U/L (ref 50–130)
ALT (SGPT): 13 U/L (ref <55)
AST (SGOT): 19 U/L (ref 8–41)
BILIRUBIN DIRECT: 0.2 mg/dL (ref <0.3)
BILIRUBIN TOTAL: 0.4 mg/dL (ref 0.3–1.3)
PROTEIN TOTAL: 8 g/dL (ref 6.0–8.0)

## 2018-12-29 LAB — PT/INR
INR: 1.1 (ref 0.80–1.20)
PROTHROMBIN TIME: 12.7 s (ref 9.1–13.9)
PROTHROMBIN TIME: 12.7 seconds (ref 9.1–13.9)

## 2018-12-29 LAB — URINALYSIS, MACROSCOPIC
BILIRUBIN: NEGATIVE mg/dL
BLOOD: NEGATIVE mg/dL
COLOR: NORMAL
GLUCOSE: NEGATIVE mg/dL
KETONES: NEGATIVE mg/dL
LEUKOCYTES: NEGATIVE WBCs/uL
NITRITE: NEGATIVE
PH: 7 (ref 5.0–8.0)
PROTEIN: NEGATIVE mg/dL
SPECIFIC GRAVITY: 1.017 (ref 1.005–1.030)
UROBILINOGEN: NEGATIVE mg/dL

## 2018-12-29 LAB — POC BLOOD GLUCOSE (RESULTS): GLUCOSE, POC: 94 mg/dL (ref 70–105)

## 2018-12-29 LAB — TYPE AND SCREEN
ABO/RH(D): O POS
ANTIBODY SCREEN: NEGATIVE

## 2018-12-29 NOTE — Anesthesia Preprocedure Evaluation (Addendum)
ANESTHESIA PRE-OP EVALUATION  Planned Procedure: Mini sternotomy, aortic valve replacement with an undetermined valve type (N/A Chest)  PERFUSION CHARGE/STBY/CARDIOPULMONARY BYPASS (N/A )  AUTOLOGOUS BLOOD CONSERVATION SETUP (N/A )  AUTOLOGOUS BLOOD CONSERVATION, MONITORING PER HOUR (N/A )  Review of Systems         patient summary reviewed  nursing notes reviewed        Pulmonary     Cardiovascular    Hypertension, well controlled, valvular problems/murmurs, murmur and DOE        GI/Hepatic/Renal   negative GI/hepatic/renal ROS,      Endo/Other          Neuro/Psych/MS        Cancer  CA,                  Physical Assessment      Patient summary reviewed and Nursing notes reviewed   Airway       Mallampati: III    TM distance: >3 FB    Neck ROM: full  Mouth Opening: fair.  No Facial hair    No endotracheal tube present  No Tracheostomy present    Dental           (+) caps           Pulmonary    Breath sounds clear to auscultation  (-) no rhonchi, no decreased breath sounds, no wheezes, no rales and no stridor     Cardiovascular        (+) murmur present (4/6 mumur RUSB to neck)        Other findings  Caps/crowns          Plan  ASA 4 - emergent     Planned anesthesia type: general            Additional Plans: Arterial line and Central line  Intravenous induction     Anesthesia issues/risks discussed are: Dental Injuries, Post-op Intubation/Ventilation, Stroke, Art Line Placement, Nerve Injuries, Central Line Placement, Cardiac Events/MI, Blood Loss and Post-op Pain Management.  Anesthetic plan and risks discussed with patient.      Use of blood products discussed with patient who consented to blood products.     Patient's NPO status is appropriate for Anesthesia.           Plan discussed with resident and attending.                 EKG Ordered: 12/29/2018  Study Result     [name]  Female, 63 years old.    XR CHEST PA AND LATERAL performed on 12/29/2018 11:58 AM.    REASON FOR EXAM:  I35.0: Aortic  stenosis  Z01.818: Preoperative testing    TECHNIQUE: 2 views/2 images submitted for interpretation.    COMPARISON: Chest radiograph 06/17/2016 and priors. CT of the chest without  contrast 12/01/2018.      FINDINGS:    No pneumonia. No pulmonary edema.  No substantial pleural effusion or pneumothorax.  Cardiomediastinal silhouette is normal.      IMPRESSION:  No acute intrathoracic abnormality.                 Other Studies:  CBC/Diff, BMP, U/A, PT/INR, Hepatic Function Panel, Hemoglobin A1c, Type and Screen            Consults: None    Patient instructed to take the following medications day of surgery, None  .  Bowel Prep Instructions:               -  Magnesium Citrate: 1 bottle (one bottle contains 300 ml or 10 ounces).                -Can be purchased over the counter (no prescription needed) at any pharmacy.                -Start 2 days before surgery at 9 am.                -After taking bowel prep, you will be on a clear liquid diet for the two days leading up to surgery.                            -A clear liquid diet consists of: chicken broth, popsicle, Jell-O, water, black coffee, Sprite. (Any liquid you can see through).                -Nothing to eat or drink the day of surgery.     Medication Stopping Instructions:  Please stop all vitamins, fish oil, omega 3, and NSAID's (Motrin, Ibuprofen, Aleve, Naprosyn/naproxen) one week prior to surgery.  If you are on Aspirin 81 mg, continue taking uninterrupted.   No medications day of surgery.   Nothing to eat or drink day of surgery.   Please stop taking Hydrochlorothiazide/Hydrodiuril 2 days before surgery.      Patient provided anesthesia consent in PEC. Instructed to review consent prior to OR date. Educated that consent will be signed morning of surgery with anesthesiologist.    Patient aware to avoid NSAID's, vitamins and herbal supplements 7 days prior to procedure.

## 2018-12-29 NOTE — Patient Instructions (Addendum)
Cardiac Surgery  [address], [address]  [place], [place] [address]  Phone / [phone] ([place])  Fax / [phone]    Surgical Plan:  Patient is scheduled for a mini sternotomy, aortic valve replacement with an undetermined valve type by Dr. Lacinda Axon on 01-04-19.    Surgical consent obtained and scanned into media manager.     Pre-operative testing to be completed to include: Preadmission Testing (PAT) today at 1 pm    Dental clearance has already been obtained and is scanned into Environmental health practitioner    Referring physician: Dr. Earlie Raveling     Bowel Prep Instructions:   - Magnesium Citrate: 1 bottle (one bottle contains 300 ml or 10 ounces).    -Can be purchased over the counter (no prescription needed) at any pharmacy.    -Start 2 days before surgery at 9 am.    -After taking bowel prep, you will be on a clear liquid diet for the two days leading up to surgery.    -A clear liquid diet consists of: chicken broth, popsicle, Jell-O, water, black coffee, Sprite. (Any liquid you can see through).    -Nothing to eat or drink the day of surgery.     Medication Stopping Instructions:  Please stop all vitamins, fish oil, omega 3, and NSAID's (Motrin, Ibuprofen, Aleve, Naprosyn/naproxen) one week prior to surgery.  If you are on Aspirin 81 mg, continue taking uninterrupted.   No medications day of surgery.   Nothing to eat or drink day of surgery.   Please stop taking Hydrochlorothiazide/Hydrodiuril 2 days before surgery.    If you have any questions regarding pre-operative instructions please call:  Burnetta Sabin, RN at [phone] or  Frances Furbish, RN at [phone]        Directions Day of Surgery:  You will be called the day before surgery between 2-6pm to confirm your arrival time for surgery.     Your procedure(s) will take place in the [facility] [address]. The day of your procedure we encourage you to pull into the circle in front of the [facility] and take  advantage of our complimentary valet parking. The entrance to the [facility] is right in the front. Upon entering the [facility], go past the [place] desk, walk past the wall of television screens all the way to the back wall and take the elevator on your right up to 2nd floor and check in at the registration desk    What to Expect After [facility] Stay  Once out of the operating room, you will be taken to the CVICU, or cardiovascular intensive care unit. The stay within the CVICU is usually only one to two nights, but may be longer if closer observation is needed. Once you no longer require CVICU care, you will be transferred to 9SEor 10SE. These units are step down units and specialize in caring for postoperative heart surgery patients. Your whole stay at [facility] will be within the [facility]. The average stay for a patient experiencing heart surgery is 5-7 days. This is completely individual and some patients require more than 7 days to be discharged from the hospital. Remember, the more active and involved each patient is, the better the outcome and shorter the stay.  Drains/Equipment   Many people are surprised by the amount of things attached to their person after surgery. Things you can expect will be: cardiac EKG  monitor, oxygen probe attached to your finger, toe, head, or earlobe, chest tubes, JP drains, pacer wires coming from your chest with a long, rectangle pacer box, and wounds on your chest, ribs, and/or legs depending on the surgery and approach. The equipment gives your care team the ability to quickly assess your status. These readings are projected on computers at the nursing stations so someone is always able to monitor you.  Wound Care  You may raise your arms over your head to brush your teeth or wash your hair, but please be careful as you do so. It is helpful to breathe in through your nose as you raise your arms and out  through your mouth as you lower them. Your sternum and surrounding muscles may be very sore. If your surgery is through your breastbone (sternum), do not lift anything over 5 pounds for 4 weeks after surgery. If your incision is on the side of your chest, do not lift anything heavier than 10 pounds for 4 weeks after surgery.   Do not push or pull with your arms, especially when rising from a bed/chair. Assistive devices should only be used for balance. Do not place your full weight on these devices until the incision is fully healed.  It is important to keep your incisions clean and dry.  Do not take tub baths for 4 weeks or until the Surgeon has given approval. Shower daily and pat incisions dry with clean towel. Wash all incisions with antimicrobial soap and water with a clean cloth. Do not put any creams, lotions or antibiotic ointments on your incisions. Do not wear tight clothing that could cause irritation to your incisions. Keep your legs raised when sitting more than 15 minutes and remember to stretch.  If you experience any of the following symptoms, please notify the Surgeon's office immediately at [phone]. These symptoms include: increased warmth of the skin around the incision, redness that spreads out more than one inch from incision edges, increased swelling, tightness or pain around an incision, large amount of clear or pinkish drainage, sudden increase in the amount of drainage, white, yellow, or green drainage with any odor coming from an incision, fever higher than 101 F (38.3 C), chills or temperature of 99 F (37.2 C) to 100.9 F (38.27 C) for more than 3 days. Continued or severe sadness, lightheadedness/dizziness, increased shortness of breath, burning when passing urine, severe calf pain, new severe pain in your chest, heart rate greater than 110 beats per minute or less than 50 beats per minutes.   Activity  Gradually increase activity as tolerated, no lifting over 10 pounds, and avoid  vigorous/strenuous activity. We suggest cautious, progressive activity to regain your normal abilities. In time, you should be able to do your regular routine activities of daily living, return to work and take part in recreational activities.   Tips:  1. Get up and get dressed every day. Please do not stay in bed as this will slow your healing process. Wear comfortable clothes and be cautious of your incisions.   2. Complete tasks in sections throughout the day instead of all at once. Remember to pace yourself. If you do too much at once or in one day, you will feel overworked and may feel fatigued the following day and need to rest, which may slow recovery. Balance your activity and rest times so it is spread out throughout the day.   3. If you need to climb stairs, please go slowly and cautiously.  Remember, it takes more energy to climb steps as it does walking on a level surface. You should only use a railing for balance. Please do not pull yourself up the steps using the railing as the strain can cause damage to your incision and alter healing.   4. Your body may begin to give you signals that it is being overworked and rest is needed. These include shortness of breath, dizziness, fatigue and pain. If you experience any of these, stop and rest.   Driving  You will not be cleared to drive until after your follow-up appointment with the Surgeon approximately 2-4 weeks after surgery. Please do not drive while using Narcotic (pain) medications. If you were involved in a car accident, you would damage your sternum and any other areas that were manipulated during surgery. If a passenger, we suggest you ride in the back seat whenever possible. If you are a front seat passenger, move as far back from the dash as possible to increase leg room and use a pillow between your chest and the seat belt as to avoid incisional irritation or pain. We do not advise to take long trips until seen by the Surgeon after surgery or  without a doctor's approval. When long trips are approved, please allow ample time to stops to walk and promote blood flow to prevent complications.

## 2018-12-30 ENCOUNTER — Telehealth (HOSPITAL_COMMUNITY): Payer: Self-pay | Admitting: THORACIC SURGERY CARDIOTHORACIC VASCULAR SURGERY

## 2018-12-30 LAB — HGA1C (HEMOGLOBIN A1C WITH EST AVG GLUCOSE)
ESTIMATED AVERAGE GLUCOSE: 103 mg/dL
HEMOGLOBIN A1C: 5.2 % (ref 4.0–5.6)

## 2018-12-30 NOTE — Patient Instructions (Signed)
Reviewed pre op instructions with patient and what to expect after surgery.    Patient verbalizes understanding of all instructions.      Surgery date on the 01-04-19 has been confirmed with patient.

## 2019-01-03 MED ORDER — POTASSIUM CHLORIDE 2 MEQ/ML MICROPLEGIA SOLUTION
1.0000 | Freq: Once | INTRAVENOUS | Status: DC
Start: 2019-01-04 — End: 2019-01-05
  Filled 2019-01-03: qty 50

## 2019-01-03 MED ORDER — CEFAZOLIN 2 GRAM SOLUTION FOR INJECTION - IV PUSH KIT
2.0000 g | Freq: Once | INTRAMUSCULAR | Status: AC
Start: 2019-01-04 — End: 2019-01-04
  Administered 2019-01-04 (×2): 2 g via INTRAVENOUS
  Filled 2019-01-03: qty 20

## 2019-01-03 MED ORDER — SODIUM BICARBONATE 1 MEQ/ML (8.4 %) INTRAVENOUS SOLUTION
1.0000 | Freq: Once | Status: DC
Start: 2019-01-04 — End: 2019-01-05
  Filled 2019-01-03: qty 29.4

## 2019-01-03 MED ORDER — SODIUM BICARBONATE 1 MEQ/ML (8.4 %) INTRAVENOUS SOLUTION
1.0000 | Freq: Once | INTRAVENOUS | Status: DC
Start: 2019-01-04 — End: 2019-01-05
  Filled 2019-01-03: qty 3.35

## 2019-01-04 ENCOUNTER — Inpatient Hospital Stay (HOSPITAL_COMMUNITY): Payer: 59 | Admitting: ANESTHESIOLOGY

## 2019-01-04 ENCOUNTER — Inpatient Hospital Stay (HOSPITAL_COMMUNITY): Payer: 59

## 2019-01-04 ENCOUNTER — Other Ambulatory Visit: Payer: Self-pay

## 2019-01-04 ENCOUNTER — Inpatient Hospital Stay (HOSPITAL_COMMUNITY): Payer: 59 | Admitting: THORACIC SURGERY CARDIOTHORACIC VASCULAR SURGERY

## 2019-01-04 ENCOUNTER — Encounter (HOSPITAL_COMMUNITY): Payer: Self-pay

## 2019-01-04 ENCOUNTER — Other Ambulatory Visit (HOSPITAL_COMMUNITY): Payer: Self-pay | Admitting: THORACIC SURGERY CARDIOTHORACIC VASCULAR SURGERY

## 2019-01-04 ENCOUNTER — Inpatient Hospital Stay (HOSPITAL_COMMUNITY): Payer: 59 | Admitting: Radiology

## 2019-01-04 ENCOUNTER — Inpatient Hospital Stay
Admission: RE | Admit: 2019-01-04 | Discharge: 2019-01-10 | DRG: 221 | Disposition: A | Discharge status: Home Health | Payer: 59 | Source: Ambulatory Visit | Attending: THORACIC SURGERY CARDIOTHORACIC VASCULAR SURGERY | Admitting: THORACIC SURGERY CARDIOTHORACIC VASCULAR SURGERY

## 2019-01-04 ENCOUNTER — Encounter (HOSPITAL_COMMUNITY)
Admission: RE | Disposition: A | Discharge status: Home Health | Payer: Self-pay | Source: Ambulatory Visit | Attending: THORACIC SURGERY CARDIOTHORACIC VASCULAR SURGERY

## 2019-01-04 DIAGNOSIS — J984 Other disorders of lung: Secondary | ICD-10-CM

## 2019-01-04 DIAGNOSIS — Z952 Presence of prosthetic heart valve: Secondary | ICD-10-CM

## 2019-01-04 DIAGNOSIS — Z95828 Presence of other vascular implants and grafts: Secondary | ICD-10-CM

## 2019-01-04 DIAGNOSIS — I350 Nonrheumatic aortic (valve) stenosis: Secondary | ICD-10-CM

## 2019-01-04 DIAGNOSIS — J9811 Atelectasis: Secondary | ICD-10-CM

## 2019-01-04 DIAGNOSIS — I4891 Unspecified atrial fibrillation: Secondary | ICD-10-CM | POA: Diagnosis not present

## 2019-01-04 DIAGNOSIS — Z9889 Other specified postprocedural states: Secondary | ICD-10-CM

## 2019-01-04 DIAGNOSIS — I712 Thoracic aortic aneurysm, without rupture: Secondary | ICD-10-CM | POA: Diagnosis present

## 2019-01-04 DIAGNOSIS — Z87891 Personal history of nicotine dependence: Secondary | ICD-10-CM

## 2019-01-04 DIAGNOSIS — I4581 Long QT syndrome: Secondary | ICD-10-CM

## 2019-01-04 DIAGNOSIS — I359 Nonrheumatic aortic valve disorder, unspecified: Secondary | ICD-10-CM

## 2019-01-04 DIAGNOSIS — I10 Essential (primary) hypertension: Secondary | ICD-10-CM | POA: Diagnosis present

## 2019-01-04 DIAGNOSIS — Z978 Presence of other specified devices: Secondary | ICD-10-CM

## 2019-01-04 DIAGNOSIS — J9589 Other postprocedural complications and disorders of respiratory system, not elsewhere classified: Secondary | ICD-10-CM

## 2019-01-04 DIAGNOSIS — Z79899 Other long term (current) drug therapy: Secondary | ICD-10-CM

## 2019-01-04 DIAGNOSIS — J90 Pleural effusion, not elsewhere classified: Secondary | ICD-10-CM

## 2019-01-04 DIAGNOSIS — J439 Emphysema, unspecified: Secondary | ICD-10-CM | POA: Diagnosis present

## 2019-01-04 DIAGNOSIS — Z01818 Encounter for other preprocedural examination: Secondary | ICD-10-CM

## 2019-01-04 DIAGNOSIS — R609 Edema, unspecified: Secondary | ICD-10-CM

## 2019-01-04 DIAGNOSIS — R011 Cardiac murmur, unspecified: Secondary | ICD-10-CM

## 2019-01-04 DIAGNOSIS — R9431 Abnormal electrocardiogram [ECG] [EKG]: Secondary | ICD-10-CM

## 2019-01-04 HISTORY — DX: Cardiac murmur, unspecified: R01.1

## 2019-01-04 LAB — POCT ISTAT CG8 BLOOD GAS ELECTROLYTES HEMATOCRIT (RESULTS)
BASE EXCESS (BEP): 3 meq/L (ref ?–2.0)
BASE EXCESS (BEP): 2 meq/L (ref ?–2.0)
BASE EXCESS (BEP): 2 meq/L (ref ?–2.0)
BASE EXCESS (BEP): 2 meq/L (ref ?–2.0)
BASE EXCESS (BEP): 5 meq/L — ABNORMAL HIGH (ref ?–2.0)
BASE EXCESS (BEP): 2 meq/L (ref ?–2.0)
BASE EXCESS (BEP): 3 meq/L (ref ?–2.0)
GLUCOSE, POC: 101 mg/dL (ref 70–105)
GLUCOSE, POC: 107 mg/dL — ABNORMAL HIGH (ref 70–105)
GLUCOSE, POC: 137 mg/dL — ABNORMAL HIGH (ref 70–105)
GLUCOSE, POC: 137 mg/dL — ABNORMAL HIGH (ref 70–105)
GLUCOSE, POC: 166 mg/dL — ABNORMAL HIGH (ref 70–105)
GLUCOSE, POC: 178 mg/dL — ABNORMAL HIGH (ref 70–105)
GLUCOSE, POC: 181 mg/dL — ABNORMAL HIGH (ref 70–105)
HCO3 (HCO3P): 28 mmol/L — ABNORMAL HIGH (ref 22–26)
HCO3 (HCO3P): 27 mmol/L — ABNORMAL HIGH (ref 22–26)
HCO3 (HCO3P): 26 mmol/L (ref 22–26)
HCO3 (HCO3P): 28 mmol/L — ABNORMAL HIGH (ref 22–26)
HCO3 (HCO3P): 29 mmol/L — ABNORMAL HIGH (ref 22–26)
HCO3 (HCO3P): 27 mmol/L — ABNORMAL HIGH (ref 22–26)
HCO3 (HCO3P): 29 mmol/L — ABNORMAL HIGH (ref 22–26)
HCO3 (HCO3P): 29 mmol/L — ABNORMAL HIGH (ref 22–26)
HEMATOCRIT, POC: 35 % (ref 33.5–45.2)
HEMATOCRIT, POC: 32 % — ABNORMAL LOW (ref 33.5–45.2)
HEMATOCRIT, POC: 27 % — ABNORMAL LOW (ref 33.5–45.2)
HEMATOCRIT, POC: 28 % — ABNORMAL LOW (ref 33.5–45.2)
HEMATOCRIT, POC: 29 % — ABNORMAL LOW (ref 33.5–45.2)
HEMATOCRIT, POC: 26 % — ABNORMAL LOW (ref 33.5–45.2)
HEMATOCRIT, POC: 31 % — ABNORMAL LOW (ref 33.5–45.2)
HEMOGLOBIN, POC: 11.9 g/dL (ref 11.2–15.2)
HEMOGLOBIN, POC: 10.9 g/dL — ABNORMAL LOW (ref 11.2–15.2)
HEMOGLOBIN, POC: 9.2 g/dL — ABNORMAL LOW (ref 11.2–15.2)
HEMOGLOBIN, POC: 9.5 g/dL — ABNORMAL LOW (ref 11.2–15.2)
HEMOGLOBIN, POC: 9.9 g/dL — ABNORMAL LOW (ref 11.2–15.2)
HEMOGLOBIN, POC: 10.5 g/dL — ABNORMAL LOW (ref 11.2–15.2)
HEMOGLOBIN, POC: 8.8 g/dL — ABNORMAL LOW (ref 11.2–15.2)
IONIZED CALCIUM, POC: 1.15 mmol/L — ABNORMAL LOW (ref 1.30–1.46)
IONIZED CALCIUM, POC: 1.15 mmol/L — ABNORMAL LOW (ref 1.30–1.46)
IONIZED CALCIUM, POC: 1.08 mmol/L — ABNORMAL LOW (ref 1.30–1.46)
IONIZED CALCIUM, POC: 1.04 mmol/L — ABNORMAL LOW (ref 1.30–1.46)
IONIZED CALCIUM, POC: 1.06 mmol/L — ABNORMAL LOW (ref 1.30–1.46)
IONIZED CALCIUM, POC: 1.08 mmol/L — ABNORMAL LOW (ref 1.30–1.46)
IONIZED CALCIUM, POC: 0.97 mmol/L — ABNORMAL LOW (ref 1.30–1.46)
IONIZED CALCIUM, POC: 0.97 mmol/L — ABNORMAL LOW (ref 1.30–1.46)
IONIZED CALCIUM, POC: 1.05 mmol/L — ABNORMAL LOW (ref 1.30–1.46)
PCO2 (PCO2P): 46 mmHg — ABNORMAL HIGH (ref 35–45)
PCO2 (PCO2P): 46 mmHg — ABNORMAL HIGH (ref 35–45)
PCO2 (PCO2P): 43 mmHg (ref 35–45)
PCO2 (PCO2P): 43 mmHg (ref 41–51)
PCO2 (PCO2P): 51 mmHg (ref 35–45)
PCO2 (PCO2P): 42 mmHg (ref 35–45)
PCO2 (PCO2P): 42 mmHg (ref 35–45)
PCO2 (PCO2P): 53 mmHg (ref 35–45)
PCO2 (PCO2P): 53 mmHg (ref 35–45)
PH (PHP): 7.4 (ref 7.35–7.45)
PH (PHP): 7.38 (ref 7.35–7.45)
PH (PHP): 7.35 (ref 7.35–7.45)
PH (PHP): 7.4 (ref 7.31–7.41)
PH (PHP): 7.44 (ref 7.35–7.45)
PH (PHP): 7.34 — ABNORMAL LOW (ref 7.35–7.45)
PH (PHP): 7.42 (ref 7.35–7.45)
PO2 (PO2P): 462 mmHg — ABNORMAL HIGH (ref 72–100)
PO2 (PO2P): 427 mmHg — ABNORMAL HIGH (ref 72–100)
PO2 (PO2P): 335 mmHg — ABNORMAL HIGH (ref 72–100)
PO2 (PO2P): 355 mmHg — ABNORMAL HIGH (ref 72–100)
PO2 (PO2P): 46 mmHg (ref 35–50)
PO2 (PO2P): 288 mmHg — ABNORMAL HIGH (ref 72–100)
PO2 (PO2P): 383 mmHg — ABNORMAL HIGH (ref 72–100)
POTASSIUM, POC: 3.7 mmol/L (ref 3.5–5.0)
POTASSIUM, POC: 3.5 mmol/L (ref 3.5–5.0)
POTASSIUM, POC: 4.6 mmol/L (ref 3.5–5.0)
POTASSIUM, POC: 5.1 mmol/L — ABNORMAL HIGH (ref 3.5–5.0)
POTASSIUM, POC: 5.1 mmol/L — ABNORMAL HIGH (ref 3.5–5.0)
POTASSIUM, POC: 5.1 mmol/L — ABNORMAL HIGH (ref 3.5–5.0)
POTASSIUM, POC: 3.8 mmol/L (ref 3.5–5.0)
POTASSIUM, POC: 4.6 mmol/L (ref 3.5–5.0)
SO2 (SO2P): 100 % (ref >=80)
SO2 (SO2P): 100 % (ref >=80)
SO2 (SO2P): 100 % (ref >=80)
SO2 (SO2P): 100 % (ref >=80)
SO2 (SO2P): 81 % (ref >=80)
SO2 (SO2P): 100 % (ref >=80)
SO2 (SO2P): 100 % (ref >=80)
SODIUM, POC: 134 mmol/L — ABNORMAL LOW (ref 136–145)
SODIUM, POC: 131 mmol/L — ABNORMAL LOW (ref 136–145)
SODIUM, POC: 131 mmol/L — ABNORMAL LOW (ref 136–145)
SODIUM, POC: 132 mmol/L — ABNORMAL LOW (ref 136–145)
SODIUM, POC: 133 mmol/L — ABNORMAL LOW (ref 136–145)
SODIUM, POC: 132 mmol/L — ABNORMAL LOW (ref 136–145)
SODIUM, POC: 135 mmol/L — ABNORMAL LOW (ref 136–145)
TCO2 (TOC2P): 29 mmol/L (ref 22–32)
TCO2 (TOC2P): 29 mmol/L (ref 22–32)
TCO2 (TOC2P): 28 mmol/L (ref 22–32)
TCO2 (TOC2P): 29 mmol/L (ref 22–32)
TCO2 (TOC2P): 30 mmol/L (ref 22–32)
TCO2 (TOC2P): 28 mmol/L (ref 22–32)
TCO2 (TOC2P): 30 mmol/L (ref 22–32)

## 2019-01-04 LAB — PTT (PARTIAL THROMBOPLASTIN TIME): APTT: 25 s (ref 24.2–37.5)

## 2019-01-04 LAB — CBC WITH DIFF
BASOPHIL #: <0.1 x10ˆ3/uL (ref <=0.20)
BASOPHIL %: 0 %
EOSINOPHIL #: <0.1 x10ˆ3/uL (ref <=0.50)
EOSINOPHIL %: 0 %
HCT: 32.5 % — ABNORMAL LOW (ref 34.8–46.0)
HGB: 10.9 g/dL — ABNORMAL LOW (ref 11.5–16.0)
IMMATURE GRANULOCYTE #: 0.11 10*3/uL — ABNORMAL HIGH (ref <0.10)
IMMATURE GRANULOCYTE %: 1 % (ref 0–1)
LYMPHOCYTE #: 0.88 x10ˆ3/uL — ABNORMAL LOW (ref 1.00–4.80)
LYMPHOCYTE %: 5 %
MCH: 30.2 pg (ref 26.0–32.0)
MCHC: 33.5 g/dL (ref 31.0–35.5)
MCV: 90 fL (ref 78.0–100.0)
MONOCYTE #: 0.72 x10ˆ3/uL (ref 0.20–1.10)
MONOCYTE %: 4 %
MPV: 8.6 fL — ABNORMAL LOW (ref 8.7–12.5)
NEUTROPHIL #: 14.53 10*3/uL — ABNORMAL HIGH (ref 1.50–7.70)
NEUTROPHIL %: 90 %
PLATELETS: 155 x10ˆ3/uL (ref 150–400)
RBC: 3.61 x10ˆ6/uL — ABNORMAL LOW (ref 3.85–5.22)
RDW-CV: 11.6 % (ref 11.5–15.5)
WBC: 16.3 x10ˆ3/uL — ABNORMAL HIGH (ref 3.7–11.0)

## 2019-01-04 LAB — TYPE AND CROSS RED CELLS - UNITS
ABO/RH(D): O POS
ANTIBODY SCREEN: NEGATIVE
UNITS ORDERED: 2

## 2019-01-04 LAB — ARTERIAL BLOOD GAS/LACTATE/CO-OX/LYTES (NA/K/CA/CL/GLUC)
%FIO2 (ARTERIAL): 44 %
PAO2/FIO2 RATIO: 220 (ref <=200)

## 2019-01-04 LAB — BPAM PACKED CELL ORDER
UNIT DIVISION: 0
UNIT DIVISION: 0

## 2019-01-04 LAB — ARTERIAL BLOOD GAS/LACTATE/CO-OX/LYTES (NA/K/CA/CL/GLUC) - ORS ONLY
BASE DEFICIT: 1.1 mmol/L (ref 0.0–3.0)
BICARBONATE (ARTERIAL): 24 mmol/L (ref 18.0–26.0)
CARBOXYHEMOGLOBIN: 1.5 % (ref 0.0–2.5)
CHLORIDE: 109 mmol/L (ref 101–111)
GLUCOSE: 117 mg/dL — ABNORMAL HIGH (ref 60–105)
HEMOGLOBIN: 12.1 g/dL (ref 12.0–18.0)
IONIZED CALCIUM: 1.24 mmol/L (ref 1.10–1.35)
LACTATE: 1.1 mmol/L (ref 0.0–1.3)
MET-HEMOGLOBIN: 1.4 % (ref 0.0–2.0)
O2CT: 16.4 % (ref 15.7–24.3)
OXYHEMOGLOBIN: 95.5 % (ref 85.0–98.0)
PCO2 (ARTERIAL): 48 mmHg — ABNORMAL HIGH (ref 35.0–45.0)
PH (ARTERIAL): 7.33 — ABNORMAL LOW (ref 7.35–7.45)
PO2 (ARTERIAL): 97 mmHg (ref 72.0–100.0)
SODIUM: 134 mmol/L — ABNORMAL LOW (ref 137–145)
WHOLE BLOOD POTASSIUM: 3.5 mmol/L (ref 3.5–4.6)

## 2019-01-04 LAB — ELECTROLYTES
ANION GAP: 6 mmol/L (ref 4–13)
CHLORIDE: 106 mmol/L (ref 96–111)
CO2 TOTAL: 26 mmol/L (ref 22–32)
POTASSIUM: 3.6 mmol/L (ref 3.5–5.1)
SODIUM: 138 mmol/L (ref 136–145)
SODIUM: 138 mmol/L (ref 136–145)

## 2019-01-04 LAB — POC BLOOD GLUCOSE (RESULTS)
GLUCOSE, POC: 93 mg/dL (ref 70–105)
GLUCOSE, POC: 126 mg/dL — ABNORMAL HIGH (ref 70–105)
GLUCOSE, POC: 151 mg/dL — ABNORMAL HIGH (ref 70–105)

## 2019-01-04 LAB — MAGNESIUM: MAGNESIUM: 3.1 mg/dL — ABNORMAL HIGH (ref 1.6–2.6)

## 2019-01-04 LAB — CREATININE: CREATININE: 0.7 mg/dL (ref 0.49–1.10)

## 2019-01-04 LAB — EKG 12-LEAD (POST CARD INTERVENTION FOLLOW UP)
Atrial Rate: 84 {beats}/min
Calculated P Axis: 68 degrees
Calculated R Axis: 37 degrees
Calculated T Axis: 79 degrees
PR Interval: 206 ms
QRS Duration: 96 ms
QT Interval: 436 ms
QTC Calculation: 515 ms
Ventricular rate: 84 {beats}/min

## 2019-01-04 LAB — PT/INR
INR: 1.31 — ABNORMAL HIGH (ref 0.80–1.20)
PROTHROMBIN TIME: 15.2 s — ABNORMAL HIGH (ref 9.1–13.9)

## 2019-01-04 LAB — BUN: BUN: 8 mg/dL (ref 8–25)

## 2019-01-04 LAB — CREATININE WITH EGFR
CREATININE: 0.7 mg/dL (ref 0.49–1.10)
ESTIMATED GFR: >60 mL/min/1.73mˆ2 (ref >60)

## 2019-01-04 LAB — POTASSIUM: POTASSIUM: 4.2 mmol/L (ref 3.5–5.1)

## 2019-01-04 SURGERY — REPLACEMENT VALVE AORTIC MINI-STERNOTOMY
Anesthesia: General | Site: Chest | Wound class: Clean Wound: Uninfected operative wounds in which no inflammation occurred

## 2019-01-04 MED ORDER — INSULIN REGULAR HUMAN 100 UNIT/ML INJ FOR MIXTURES -RX ADMIN UNIT ROUNDS TO NEAREST 0.01 ML
0.0250 [IU]/kg/h | INTRAMUSCULAR | Status: DC
Start: 2019-01-04 — End: 2019-01-04
  Administered 2019-01-04: 0 [IU]/kg/h via INTRAVENOUS
  Filled 2019-01-04: qty 2.5

## 2019-01-04 MED ORDER — ONDANSETRON HCL (PF) 4 MG/2 ML INJECTION SOLUTION
4.0000 mg | Freq: Three times a day (TID) | INTRAMUSCULAR | Status: DC | PRN
Start: 2019-01-04 — End: 2019-01-05
  Administered 2019-01-04: 4 mg via INTRAVENOUS
  Filled 2019-01-04: qty 2

## 2019-01-04 MED ORDER — LIDOCAINE (PF) 100 MG/5 ML (2 %) INTRAVENOUS SYRINGE
INJECTION | Freq: Once | INTRAVENOUS | Status: DC | PRN
Start: 2019-01-04 — End: 2019-01-04
  Administered 2019-01-04: 50 mg via INTRAVENOUS
  Administered 2019-01-04: 100 mg via INTRAVENOUS

## 2019-01-04 MED ORDER — SODIUM CHLORIDE 0.9 % (FLUSH) INJECTION SYRINGE
2.0000 mL | INJECTION | INTRAMUSCULAR | Status: DC | PRN
Start: 2019-01-04 — End: 2019-01-04

## 2019-01-04 MED ORDER — OXYCODONE 5 MG TABLET
10.0000 mg | ORAL_TABLET | ORAL | Status: DC | PRN
Start: 2019-01-04 — End: 2019-01-06

## 2019-01-04 MED ORDER — MAGNESIUM SULFATE 4 GRAM/100 ML (4 %) IN WATER INTRAVENOUS PIGGYBACK
INJECTION | INTRAVENOUS | Status: AC
Start: 2019-01-04 — End: 2019-01-04
  Filled 2019-01-04: qty 100

## 2019-01-04 MED ORDER — SODIUM CHLORIDE 0.9 % (FLUSH) INJECTION SYRINGE
2.0000 mL | INJECTION | INTRAMUSCULAR | Status: DC | PRN
Start: 2019-01-04 — End: 2019-01-10
  Administered 2019-01-04: 2 mL
  Administered 2019-01-05: 4 mL
  Filled 2019-01-04 (×2): qty 10

## 2019-01-04 MED ORDER — MIDAZOLAM 1 MG/ML INJECTION SOLUTION
Freq: Once | INTRAMUSCULAR | Status: DC | PRN
Start: 2019-01-04 — End: 2019-01-04
  Administered 2019-01-04 (×2): 1 mg via INTRAVENOUS

## 2019-01-04 MED ORDER — BUPIVACAINE LIPOSOME(PF) 1.3 %(13.3 MG/ML) SUSPENSION FOR INFILTRATION
20.0000 mL | Freq: Once | Status: DC
Start: 2019-01-04 — End: 2019-01-05
  Filled 2019-01-04: qty 20

## 2019-01-04 MED ORDER — MAGNESIUM SULFATE 4 GRAM/100 ML (4 %) IN WATER INTRAVENOUS PIGGYBACK
INJECTION | Freq: Once | INTRAVENOUS | Status: DC | PRN
Start: 2019-01-04 — End: 2019-01-04
  Administered 2019-01-04: 4 g via INTRAVENOUS

## 2019-01-04 MED ORDER — BUPIVACAINE LIPOSOME(PF) 1.3 %(13.3 MG/ML) SUSPENSION FOR INFILTRATION
20.0000 mL | Freq: Once | Status: DC
Start: 2019-01-04 — End: 2019-01-04

## 2019-01-04 MED ORDER — VANCOMYCIN 1,000 MG IV POWDER - FOR OR
Freq: Once | TOPICAL | Status: DC | PRN
Start: 2019-01-04 — End: 2019-01-04
  Administered 2019-01-04: 4 g via TOPICAL

## 2019-01-04 MED ORDER — SODIUM CHLORIDE 0.9 % INTRAVENOUS SOLUTION
INTRAVENOUS | Status: AC
Start: 2019-01-04 — End: 2019-01-04
  Filled 2019-01-04: qty 15

## 2019-01-04 MED ORDER — HYDROMORPHONE 2 MG/ML INJECTION SYRINGE
0.2000 mg | INJECTION | INTRAMUSCULAR | Status: DC | PRN
Start: 2019-01-04 — End: 2019-01-05
  Administered 2019-01-04 – 2019-01-05 (×2): 0.2 mg via INTRAVENOUS
  Filled 2019-01-04 (×2): qty 1

## 2019-01-04 MED ORDER — WARFARIN 5 MG TABLET
5.0000 mg | ORAL_TABLET | Freq: Once | ORAL | Status: AC
Start: 2019-01-04 — End: 2019-01-04
  Administered 2019-01-04: 5 mg via ORAL
  Filled 2019-01-04: qty 1

## 2019-01-04 MED ORDER — CALCIUM CHLORIDE 100 MG/ML (10 %) INTRAVENOUS SYRINGE
INJECTION | Freq: Once | INTRAVENOUS | Status: DC | PRN
Start: 2019-01-04 — End: 2019-01-04
  Administered 2019-01-04 (×4): 250 mg via INTRAVENOUS

## 2019-01-04 MED ORDER — IPRATROPIUM BROMIDE 0.02 % SOLUTION FOR INHALATION
0.5000 mg | RESPIRATORY_TRACT | Status: DC | PRN
Start: 2019-01-04 — End: 2019-01-10

## 2019-01-04 MED ORDER — AMINOCAPROIC ACID 5 G IN NS 250ML INFUSION - FOR ANES
INTRAVENOUS | Status: DC | PRN
Start: 2019-01-04 — End: 2019-01-04
  Administered 2019-01-04: 1 g/h via INTRAVENOUS

## 2019-01-04 MED ORDER — OXYCODONE 5 MG TABLET
5.0000 mg | ORAL_TABLET | ORAL | Status: DC | PRN
Start: 2019-01-04 — End: 2019-01-06

## 2019-01-04 MED ORDER — ACETAMINOPHEN 325 MG TABLET
975.0000 mg | ORAL_TABLET | Freq: Four times a day (QID) | ORAL | Status: DC
Start: 2019-01-04 — End: 2019-01-06
  Administered 2019-01-04 – 2019-01-05 (×2): 975 mg via ORAL
  Administered 2019-01-05: 0 mg via ORAL
  Administered 2019-01-05 – 2019-01-06 (×4): 975 mg via ORAL
  Filled 2019-01-04 (×6): qty 3

## 2019-01-04 MED ORDER — PROPOFOL 10 MG/ML IV BOLUS
INJECTION | Freq: Once | INTRAVENOUS | Status: DC | PRN
Start: 2019-01-04 — End: 2019-01-04
  Administered 2019-01-04: 25 mg via INTRAVENOUS
  Administered 2019-01-04: 30 mg via INTRAVENOUS
  Administered 2019-01-04: 50 mg via INTRAVENOUS
  Administered 2019-01-04: 30 mg via INTRAVENOUS

## 2019-01-04 MED ORDER — PHENYLEPHRINE 0.5 MG/5 ML (100 MCG/ML)IN 0.9 % SOD.CHLORIDE IV SYRINGE
INJECTION | Freq: Once | INTRAVENOUS | Status: DC | PRN
Start: 2019-01-04 — End: 2019-01-04
  Administered 2019-01-04 (×2): 200 ug via INTRAVENOUS

## 2019-01-04 MED ORDER — CEFAZOLIN 10 GRAM SOLUTION FOR INJECTION
2.0000 g | Freq: Three times a day (TID) | INTRAMUSCULAR | Status: DC
Start: 2019-01-04 — End: 2019-01-04
  Filled 2019-01-04: qty 20

## 2019-01-04 MED ORDER — MUPIROCIN 2 % TOPICAL OINTMENT
TOPICAL_OINTMENT | Freq: Two times a day (BID) | CUTANEOUS | Status: AC
Start: 2019-01-04 — End: 2019-01-09
  Administered 2019-01-04 – 2019-01-08 (×3): 0 via TOPICAL
  Filled 2019-01-04: qty 22

## 2019-01-04 MED ORDER — SODIUM CHLORIDE 0.9 % INTRAVENOUS SOLUTION
INTRAVENOUS | Status: AC
Start: 2019-01-04 — End: 2019-01-04
  Filled 2019-01-04: qty 2.5

## 2019-01-04 MED ORDER — AMINOCAPROIC ACID 250 MG/ML INTRAVENOUS SOLUTION
Freq: Once | INTRAVENOUS | Status: DC | PRN
Start: 2019-01-04 — End: 2019-01-04
  Administered 2019-01-04: 10 g via INTRAVENOUS

## 2019-01-04 MED ORDER — NITROGLYCERIN 50 MCG/ML IV DILUTION - FOR ANES
INJECTION | Freq: Once | INTRAVENOUS | Status: DC | PRN
Start: 2019-01-04 — End: 2019-01-04
  Administered 2019-01-04: 50 ug via INTRAVENOUS

## 2019-01-04 MED ORDER — PHENYLEPHRINE 50 MG/250 ML (200 MCG/ML) IN 0.9 % SODIUM CHLORIDE IV
INTRAVENOUS | Status: AC
Start: 2019-01-04 — End: 2019-01-04
  Filled 2019-01-04: qty 250

## 2019-01-04 MED ORDER — SODIUM CHLORIDE 0.9 % IRRIGATION SOLUTION
Freq: Once | Status: DC | PRN
Start: 2019-01-04 — End: 2019-01-04
  Administered 2019-01-04: 2000 mL

## 2019-01-04 MED ORDER — SODIUM CHLORIDE 0.9 % (FLUSH) INJECTION SYRINGE
2.0000 mL | INJECTION | Freq: Three times a day (TID) | INTRAMUSCULAR | Status: DC
Start: 2019-01-04 — End: 2019-01-04

## 2019-01-04 MED ORDER — ROCURONIUM 10 MG/ML INTRAVENOUS SOLUTION
Freq: Once | INTRAVENOUS | Status: DC | PRN
Start: 2019-01-04 — End: 2019-01-04
  Administered 2019-01-04: 100 mg via INTRAVENOUS
  Administered 2019-01-04: 10 mg via INTRAVENOUS

## 2019-01-04 MED ORDER — HEPARIN (PORCINE) 1,000 UNIT/ML INJECTION SOLUTION
Freq: Once | INTRAMUSCULAR | Status: DC | PRN
Start: 2019-01-04 — End: 2019-01-04
  Administered 2019-01-04: 25000 [IU] via INTRAVENOUS

## 2019-01-04 MED ORDER — AMIODARONE 150 MG IN D5W 100ML IV BOLUS - FOR ANES
INJECTION | Freq: Once | INTRAVENOUS | Status: DC | PRN
Start: 2019-01-04 — End: 2019-01-04
  Administered 2019-01-04: 150 mg via INTRAVENOUS

## 2019-01-04 MED ORDER — PHENYLEPHRINE 50 MG/250 ML (200 MCG/ML) IN 0.9 % SODIUM CHLORIDE IV
INTRAVENOUS | Status: DC | PRN
Start: 2019-01-04 — End: 2019-01-04
  Administered 2019-01-04: 0.5 ug/kg/min via INTRAVENOUS
  Administered 2019-01-04 (×2): 0 ug/kg/min via INTRAVENOUS
  Administered 2019-01-04: 0.3 ug/kg/min via INTRAVENOUS
  Administered 2019-01-04: 0.5 ug/kg/min via INTRAVENOUS

## 2019-01-04 MED ORDER — CEFAZOLIN 1 GRAM SOLUTION FOR INJECTION
INTRAMUSCULAR | Status: AC
Start: 2019-01-04 — End: 2019-01-04
  Filled 2019-01-04: qty 20

## 2019-01-04 MED ORDER — NITROGLYCERIN 100 MCG/ML IN D5W INJECTION
INJECTION | INTRAVENOUS | Status: AC
Start: 2019-01-04 — End: 2019-01-04
  Filled 2019-01-04: qty 20

## 2019-01-04 MED ORDER — SODIUM CHLORIDE 0.9 % (FLUSH) INJECTION SYRINGE
2.0000 mL | INJECTION | Freq: Three times a day (TID) | INTRAMUSCULAR | Status: DC
Start: 2019-01-04 — End: 2019-01-10
  Administered 2019-01-04: 0 mL
  Administered 2019-01-04: 2 mL
  Administered 2019-01-04: 0 mL
  Administered 2019-01-05: 2 mL
  Administered 2019-01-05: 0 mL
  Administered 2019-01-05 – 2019-01-07 (×4): 2 mL
  Administered 2019-01-07: 0 mL
  Administered 2019-01-07: 2 mL
  Administered 2019-01-08: 0 mL
  Administered 2019-01-08 (×2): 2 mL
  Administered 2019-01-09: 0 mL
  Administered 2019-01-09 – 2019-01-10 (×3): 2 mL

## 2019-01-04 MED ORDER — EPINEPHRINE 15MG IN NS 250ML INFUSION - FOR ANES
INTRAVENOUS | Status: DC | PRN
Start: 2019-01-04 — End: 2019-01-04
  Administered 2019-01-04: 0.03 ug/kg/min via INTRAVENOUS

## 2019-01-04 MED ORDER — ELECTROLYTE-A INTRAVENOUS SOLUTION
INTRAVENOUS | Status: DC
Start: 2019-01-04 — End: 2019-01-04

## 2019-01-04 MED ORDER — ACETAMINOPHEN 325 MG TABLET
650.0000 mg | ORAL_TABLET | ORAL | Status: DC | PRN
Start: 2019-01-06 — End: 2019-01-10
  Administered 2019-01-06: 325 mg via ORAL
  Administered 2019-01-06: 650 mg via ORAL
  Administered 2019-01-07: 0 mg via ORAL
  Administered 2019-01-09: 650 mg via ORAL
  Filled 2019-01-04 (×6): qty 2

## 2019-01-04 MED ORDER — MAGNESIUM HYDROXIDE 400 MG/5 ML ORAL SUSPENSION
30.0000 mL | Freq: Every day | ORAL | Status: DC | PRN
Start: 2019-01-04 — End: 2019-01-10

## 2019-01-04 MED ORDER — HEPARIN (PORCINE) 5,000 UNIT/ML INJECTION SOLUTION
5000.0000 [IU] | Freq: Three times a day (TID) | INTRAMUSCULAR | Status: DC
Start: 2019-01-05 — End: 2019-01-06
  Administered 2019-01-05 – 2019-01-06 (×3): 5000 [IU] via SUBCUTANEOUS
  Filled 2019-01-04 (×3): qty 1

## 2019-01-04 MED ORDER — DEXTROSE 5 % IN WATER (D5W) INTRAVENOUS SOLUTION
2.0000 g | Freq: Three times a day (TID) | INTRAVENOUS | Status: AC
Start: 2019-01-04 — End: 2019-01-06
  Administered 2019-01-04: 2 g via INTRAVENOUS
  Administered 2019-01-04: 0 g via INTRAVENOUS
  Administered 2019-01-05: 2 g via INTRAVENOUS
  Administered 2019-01-05 (×2): 0 g via INTRAVENOUS
  Administered 2019-01-05: 2 g via INTRAVENOUS
  Administered 2019-01-05: 0 g via INTRAVENOUS
  Administered 2019-01-05 – 2019-01-06 (×2): 2 g via INTRAVENOUS
  Administered 2019-01-06: 0 g via INTRAVENOUS
  Filled 2019-01-04 (×5): qty 20

## 2019-01-04 MED ORDER — DEXMEDETOMIDINE 4 MCG/ML IV DILUTION
Freq: Once | INTRAMUSCULAR | Status: DC | PRN
Start: 2019-01-04 — End: 2019-01-04
  Administered 2019-01-04 (×2): 12 ug via INTRAVENOUS

## 2019-01-04 MED ORDER — KETOROLAC 30 MG/ML (1 ML) INJECTION SOLUTION
15.0000 mg | Freq: Once | INTRAMUSCULAR | Status: AC
Start: 2019-01-04 — End: 2019-01-04
  Filled 2019-01-04: qty 1

## 2019-01-04 MED ORDER — DEXMEDETOMIDINE 4 MCG/ML IV DILUTION
INTRAMUSCULAR | Status: AC
Start: 2019-01-04 — End: 2019-01-04
  Filled 2019-01-04: qty 10

## 2019-01-04 MED ORDER — ASCORBIC ACID (VITAMIN C) 500 MG TABLET
2000.0000 mg | ORAL_TABLET | Freq: Once | ORAL | Status: AC
Start: 2019-01-04 — End: 2019-01-04
  Administered 2019-01-04 (×2): 2000 mg via ORAL
  Filled 2019-01-04: qty 4

## 2019-01-04 MED ORDER — LEVALBUTEROL CONCENTRATE 1.25 MG/0.5 ML SOLUTION FOR NEBULIZATION
1.2500 mg | INHALATION_SOLUTION | RESPIRATORY_TRACT | Status: DC | PRN
Start: 2019-01-04 — End: 2019-01-10

## 2019-01-04 MED ORDER — KETOROLAC 30 MG/ML (1 ML) INJECTION SOLUTION
15.0000 mg | Freq: Four times a day (QID) | INTRAMUSCULAR | Status: DC | PRN
Start: 2019-01-04 — End: 2019-01-05
  Administered 2019-01-05: 15 mg via INTRAVENOUS
  Filled 2019-01-04: qty 1

## 2019-01-04 MED ORDER — MANNITOL 20 % INTRAVENOUS SOLUTION
INTRAVENOUS | Status: AC
Start: 2019-01-04 — End: 2019-01-04
  Filled 2019-01-04: qty 250

## 2019-01-04 MED ORDER — ELECTROLYTE-A INTRAVENOUS SOLUTION
INTRAVENOUS | Status: DC | PRN
Start: 2019-01-04 — End: 2019-01-04

## 2019-01-04 MED ORDER — CHLORHEXIDINE GLUCONATE 0.12 % MOUTHWASH
15.0000 mL | MOUTHWASH | Freq: Two times a day (BID) | Status: DC
Start: 2019-01-04 — End: 2019-01-05
  Administered 2019-01-04: 15 mL via TOPICAL
  Administered 2019-01-04: 0 mL via TOPICAL
  Administered 2019-01-05: 15 mL via TOPICAL
  Filled 2019-01-04 (×2): qty 15

## 2019-01-04 MED ORDER — VANCOMYCIN 1,000 MG INTRAVENOUS INJECTION
INTRAVENOUS | Status: AC
Start: 2019-01-04 — End: 2019-01-04
  Filled 2019-01-04: qty 40

## 2019-01-04 MED ORDER — ASPIRIN 81 MG CHEWABLE TABLET
81.0000 mg | CHEWABLE_TABLET | Freq: Every day | ORAL | Status: DC
Start: 2019-01-04 — End: 2019-01-10
  Administered 2019-01-04: 0 mg via ORAL
  Administered 2019-01-05 – 2019-01-07 (×3): 81 mg via ORAL
  Administered 2019-01-08: 0 mg via ORAL
  Administered 2019-01-09 – 2019-01-10 (×2): 81 mg via ORAL
  Filled 2019-01-04 (×7): qty 1

## 2019-01-04 MED ORDER — INSULIN LISPRO 100 UNIT/ML SUB-Q SSIP
0.00 [IU] | INJECTION | SUBCUTANEOUS | Status: DC | PRN
Start: 2019-01-04 — End: 2019-01-05
  Administered 2019-01-04: 2 [IU] via SUBCUTANEOUS
  Filled 2019-01-04: qty 3

## 2019-01-04 MED ORDER — SUGAMMADEX 100 MG/ML INTRAVENOUS SOLUTION
Freq: Once | INTRAVENOUS | Status: DC | PRN
Start: 2019-01-04 — End: 2019-01-04
  Administered 2019-01-04: 380 mg via INTRAVENOUS

## 2019-01-04 MED ORDER — ALBUMIN, HUMAN 5 % INTRAVENOUS SOLUTION
INTRAVENOUS | Status: DC | PRN
Start: 2019-01-04 — End: 2019-01-04

## 2019-01-04 MED ORDER — BISACODYL 10 MG RECTAL SUPPOSITORY
10.0000 mg | Freq: Every day | RECTAL | Status: DC | PRN
Start: 2019-01-04 — End: 2019-01-10

## 2019-01-04 MED ORDER — SODIUM CHLORIDE 0.9 % INTRAVENOUS SOLUTION
INTRAVENOUS | Status: AC
Start: 2019-01-04 — End: 2019-01-04
  Filled 2019-01-04: qty 30

## 2019-01-04 MED ORDER — ONDANSETRON HCL (PF) 4 MG/2 ML INJECTION SOLUTION
Freq: Once | INTRAMUSCULAR | Status: DC | PRN
Start: 2019-01-04 — End: 2019-01-04
  Administered 2019-01-04: 4 mg via INTRAVENOUS

## 2019-01-04 MED ORDER — NOREPINEPHRINE 16MG IN NS 250ML INFUSION - FOR ANES
INTRAVENOUS | Status: DC | PRN
Start: 2019-01-04 — End: 2019-01-04
  Administered 2019-01-04: 0.03 ug/kg/min via INTRAVENOUS
  Administered 2019-01-04: 0 ug/kg/min via INTRAVENOUS

## 2019-01-04 MED ORDER — ASCORBIC ACID (VITAMIN C) 500 MG TABLET
500.0000 mg | ORAL_TABLET | Freq: Two times a day (BID) | ORAL | Status: AC
Start: 2019-01-05 — End: 2019-01-09
  Administered 2019-01-05 – 2019-01-07 (×5): 500 mg via ORAL
  Administered 2019-01-07 – 2019-01-08 (×3): 0 mg via ORAL
  Filled 2019-01-04 (×8): qty 1

## 2019-01-04 MED ORDER — BUPIVACAINE (PF) 0.25 % (2.5 MG/ML) INJECTION SOLUTION
Freq: Once | INTRAMUSCULAR | Status: DC | PRN
Start: 2019-01-04 — End: 2019-01-04
  Administered 2019-01-04: 20 mL

## 2019-01-04 MED ORDER — SODIUM CHLORIDE 0.9 % INTRAVENOUS SOLUTION
INTRAVENOUS | Status: DC | PRN
Start: 2019-01-04 — End: 2019-01-04
  Administered 2019-01-04: 4 [IU]/h via INTRAVENOUS
  Administered 2019-01-04: 1 [IU]/h via INTRAVENOUS

## 2019-01-04 MED ORDER — METOPROLOL TARTRATE 6.25 MG QUARTER TAB
6.2500 mg | ORAL_TABLET | Freq: Once | ORAL | Status: DC | PRN
Start: 2019-01-04 — End: 2019-01-05

## 2019-01-04 MED ORDER — LACTATED RINGERS INTRAVENOUS SOLUTION
INTRAVENOUS | Status: DC
Start: 2019-01-04 — End: 2019-01-04

## 2019-01-04 MED ORDER — PROMETHAZINE 12.5 MG TABLET
12.5000 mg | ORAL_TABLET | Freq: Once | ORAL | Status: AC
Start: 2019-01-04 — End: 2019-01-04
  Administered 2019-01-04: 12.5 mg via ORAL
  Filled 2019-01-04: qty 1

## 2019-01-04 MED ORDER — NITROGLYCERIN 50 MG/250 ML (200 MCG/ML) IN 5 % DEXTROSE INTRAVENOUS
INTRAVENOUS | Status: AC
Start: 2019-01-04 — End: 2019-01-04
  Filled 2019-01-04: qty 250

## 2019-01-04 MED ORDER — PROTAMINE 10 MG/ML INTRAVENOUS SOLUTION
Freq: Once | INTRAVENOUS | Status: DC | PRN
Start: 2019-01-04 — End: 2019-01-04
  Administered 2019-01-04: 190 mg via INTRAVENOUS

## 2019-01-04 MED ORDER — FENTANYL (PF) 50 MCG/ML INJECTION SOLUTION
Freq: Once | INTRAMUSCULAR | Status: DC | PRN
Start: 2019-01-04 — End: 2019-01-04
  Administered 2019-01-04 (×4): 50 ug via INTRAVENOUS
  Administered 2019-01-04: 200 ug via INTRAVENOUS

## 2019-01-04 MED ORDER — PROCHLORPERAZINE EDISYLATE 10 MG/2 ML (5 MG/ML) INJECTION SOLUTION
5.0000 mg | Freq: Four times a day (QID) | INTRAMUSCULAR | Status: DC | PRN
Start: 2019-01-04 — End: 2019-01-05
  Administered 2019-01-04: 5 mg via INTRAVENOUS
  Filled 2019-01-04 (×2): qty 2

## 2019-01-04 MED ORDER — HYDROMORPHONE 2 MG/ML INJECTION SYRINGE
INJECTION | INTRAMUSCULAR | Status: AC
Start: 2019-01-04 — End: 2019-01-04
  Administered 2019-01-04: 0.2 mg via INTRAVENOUS
  Filled 2019-01-04: qty 1

## 2019-01-04 MED ORDER — ACETAMINOPHEN 1,000 MG/100 ML (10 MG/ML) INTRAVENOUS SOLUTION
1000.0000 mg | Freq: Once | INTRAVENOUS | Status: AC
Start: 2019-01-04 — End: 2019-01-04
  Administered 2019-01-04: 1000 mg via INTRAVENOUS
  Administered 2019-01-04: 0 mg via INTRAVENOUS
  Filled 2019-01-04: qty 100

## 2019-01-04 MED ORDER — KETAMINE 10 MG/ML INJECTION WRAPPER
Freq: Once | INTRAMUSCULAR | Status: DC | PRN
Start: 2019-01-04 — End: 2019-01-04
  Administered 2019-01-04: 30 mg via INTRAVENOUS

## 2019-01-04 MED ADMIN — electrolyte-A intravenous solution: INTRAVENOUS | @ 09:00:00

## 2019-01-04 MED ADMIN — phenylephrine 0.5 mg/5 mL (100 mcg/mL)in 0.9 % sod.chloride IV syringe: INTRAVENOUS | @ 10:00:00

## 2019-01-04 MED ADMIN — lactated Ringers intravenous solution: INTRAVENOUS | @ 11:00:00

## 2019-01-04 MED ADMIN — chlorhexidine gluconate 0.12 % mouthwash: TOPICAL | @ 15:00:00

## 2019-01-04 MED ADMIN — chlorhexidine gluconate 0.12 % mouthwash: TOPICAL | @ 21:00:00

## 2019-01-04 MED ADMIN — sodium chloride 0.9 % (flush) injection syringe: @ 14:00:00

## 2019-01-04 MED ADMIN — magnesium sulfate 4 gram/100 mL (4 %) in water intravenous piggyback: INTRAVENOUS | @ 12:00:00

## 2019-01-04 MED ADMIN — midazolam 1 mg/mL injection solution: INTRAVENOUS | @ 09:00:00

## 2019-01-04 MED ADMIN — warfarin 5 mg tablet: ORAL | @ 21:00:00

## 2019-01-04 MED ADMIN — sugammadex 100 mg/mL intravenous solution: INTRAVENOUS | @ 13:00:00

## 2019-01-04 MED ADMIN — lactated Ringers intravenous solution: INTRAVENOUS | @ 07:00:00

## 2019-01-04 MED ADMIN — sodium chloride 0.9 % (flush) injection syringe: @ 07:00:00

## 2019-01-04 MED ADMIN — HYDROmorphone 2 mg/mL injection syringe: INTRAVENOUS | @ 14:00:00

## 2019-01-04 MED ADMIN — ascorbic acid (vitamin C) 500 mg tablet: ORAL | @ 22:00:00

## 2019-01-04 MED ADMIN — protamine 10 mg/mL intravenous solution: INTRAVENOUS | @ 12:00:00

## 2019-01-04 MED ADMIN — oxyCODONE-acetaminophen 5 mg-325 mg tablet: INTRAVENOUS | @ 10:00:00

## 2019-01-04 MED ADMIN — sodium chloride 0.9 % (flush) injection syringe: @ 22:00:00

## 2019-01-04 MED ADMIN — sodium chloride 0.9 % (flush) injection syringe: INTRAVENOUS | @ 09:00:00

## 2019-01-04 MED ADMIN — lactated Ringers intravenous solution: INTRAVENOUS | @ 13:00:00

## 2019-01-04 MED ADMIN — sodium chloride 0.9 % (flush) injection syringe: TOPICAL | @ 11:00:00

## 2019-01-04 MED ADMIN — dilTIAZem CD 120 mg capsule,extended release 24 hr: INTRAVENOUS | @ 10:00:00

## 2019-01-04 MED ADMIN — sodium chloride 0.9 % (flush) injection syringe: INTRAVENOUS | @ 12:00:00

## 2019-01-04 MED ADMIN — AMPICILLIN IVPB 50/100ML/15MIN - 2G VIAL PREP: ORAL | @ 18:00:00

## 2019-01-04 MED ADMIN — lactated Ringers intravenous solution: ORAL | @ 22:00:00

## 2019-01-04 MED ADMIN — sodium chloride 0.9 % (flush) injection syringe: INTRAVENOUS | @ 13:00:00

## 2019-01-04 MED ADMIN — lidocaine 1 %-epinephrine 1:100,000 injection solution: INTRAVENOUS | @ 17:00:00

## 2019-01-04 MED ADMIN — sodium chloride 0.9 % intravenous solution: INTRAVENOUS | @ 14:00:00

## 2019-01-04 MED ADMIN — pantoprazole 40 mg tablet,delayed release: ORAL | @ 14:00:00

## 2019-01-04 MED ADMIN — calcium acetate(phosphate binders) 667 mg tablet: INTRAVENOUS | @ 10:00:00

## 2019-01-04 MED ADMIN — PREMIXED HEMODIALYSATE W/ ADDITIVES: INTRAVENOUS | @ 13:00:00

## 2019-01-04 MED ADMIN — diazePAM 5 mg tablet: INTRAVENOUS | @ 13:00:00

## 2019-01-04 MED ADMIN — midazolam 2 mg/mL oral syrup: INTRAVENOUS | @ 12:00:00

## 2019-01-04 SURGICAL SUPPLY — 102 items
CABLE PACE SCREW DOWN BEIGE_FL60197 BEIGE (CARDIAC) ×1
CABLE PACING BGE 6FT FSLC ATR VNTRC EXT CONN SCREW DN STRL (CARDIAC) ×1
CABLE PACING BGE 6FT FSLC ATR VNTRC EXT CONN SCREW DN STRL LF  DISP (CARDIAC) ×2 IMPLANT
CANNULA 14FR 18MM 12.5IN POLYUR SLF INFL TXTR BAL PSHP STY HNDL RETRO COR SIN STRL PERF DISP (CANNULA) ×2 IMPLANT
CANNULA 14GA DLP 5.5IN ANTGRD INTROD VENT LINE STD TIP ADULT AOR ROOT PERF (CANNULA) ×2 IMPLANT
CANNULA 36/46FR .5IN MC2 15IN 2 STG ML PRT TIP NONVENT CONN TW VENOUS PERF (CANNULA) ×2 IMPLANT
CANNULA AROTIC ROOT 14GA W/_20014 20/PK (CANNULA) ×2
CANNULA DUAL MALLEABLE 16 X10_MIN ORDER 10 (CANNULA)
CANNULA MC2 VENOUS 36/46FR_OVAL W/1/2 CONN PK/10 91265C (CANNULA) ×1
CANNULA PERF 14FR 18MM COR (CANNULA) ×1
CANNULA PERF 18FR FEM ART (CANNULA) ×3 IMPLANT
CART HEPCON 5L HEP DS RSPN CAR_T SYRG BLUNT TIP NEEDLE (TEST) ×3 IMPLANT
CART HEPCON SILVER 2-3.5MG/KG_BLUNT HEP 4 CHNL SYRG NEEDLE (TEST) IMPLANT
CART HMS + HEP ASY 2 CHNL 9 SYRG HI RNG ACT CLOT TIME ANALYZER DISP (TEST) ×8 IMPLANT
CART HMS + HR-ACT HEP ASY 2 CH_NL 9 SYRG BLUNT NEEDLE (TEST) ×8
CART HMS + RD .9- MG/KG 4 CHNL 9 SYRG HEP ASY BLUNT NEEDLE ANALYZER DISP (TEST) ×8 IMPLANT
CART HMS + RD 0-.9MG/KG HEP AS_Y 4 CHNL 9 SYRG BLUNT NEEDLE (TEST) ×4
CART ISTAT HEMA CG8+ ANALYZER (REAGENT) ×18 IMPLANT
CATH PERF DLP 13FR 13IN 2IN .25IN VENT CONN PRFRM DEPTH MRK LFT HRT PVC (CARDIAC) ×2 IMPLANT
CATH PERF DLP 13FR 13IN 2IN .2_5IN VENT CONN PRFRM D MRK LFT (CARDIAC) ×1
CATH PERF DLP 20FR .25IN 16IN_BLT ANG RGT MLBL INTROD NVNT (CARDIAC) ×1
CATH PERF DLP 20FR 16IN .25IN MLBL INTROD NONVENT SLIP CONN LFT VNTRC SIL ADULT (CARDIAC) ×2 IMPLANT
CNTNR POLYPROP 4OZ STL SCREW_ON LID LEK RST STRL SP LF (TUB) ×3
CONN PERF RDC .5IN 3/8IN .5IN STRL (Connecting Tubes/Misc) ×2 IMPLANT
CONNECTOR 3/8 X 1/2_050518000 (Connecting Tubes/Misc) ×1
CONTAINR POLYPROP 4OZ STL SCREW ON LID LEAK RST STRL SPECI LF (TUB) ×6
CONV USE 153512 - CONTAINR POLYPROP 4OZ STL SCREW ON LID LEAK RST STRL SPECI LF (TUB) ×6 IMPLANT
CONV USE ITEM 338686 - PACK SURG CARDIAC NONST DISP LF (CUSTOM TRAYS & PACK) ×2 IMPLANT
DEVICE SUT COR-KNT QUICK LD 6 PCH TI STRL (GUIDING) ×2 IMPLANT
DEVICE SUT COR-KNT QUICK LD 6_CH TI STRL (GUIDING) ×1
DISC USE ITEM 163322 - SYRINGE LL 20ML LTX STRL MED (NEEDLES & SYRINGE SUPPLIES) ×2 IMPLANT
DRAIN INCS 24FR JP SIL CHNL STRL LF  RND FLUTE DISP WHT (Drains/Resovoirs) ×4 IMPLANT
DRAIN WND RND 24FR JP2234_10/BX (Drains/Resovoirs) ×2
DUP USE ITEM 161800 - RESERVOIR AUTOTRANSFUSION 40 U_M COLLECTION FILTER (PERFUSION/HEART SUPPLIES) ×3 IMPLANT
DUPE USE ITEM 150679 - FX OXY W HR ART FILTER ADVANCE (MISCELLANEOUS PT CARE ITEMS) ×2 IMPLANT
DUPE USE ITEM 306370 - OXYGENATOR PERF 3CXFX15RE (PERFUSION/HEART SUPPLIES) IMPLANT
FILTER BLOOD PALL LEUKOGUARD 6/CS LG6B (BLOOD) IMPLANT
FILTER BLOOD TRNSF_SQ40S (BLOOD)
FILTER O2 PRE BYPASS 1/4X1/4_029029000 25EA/CS (ANETHESIA SUPPLIES) ×1
FILTER PERF .25IN O2 STRL (ANETHESIA SUPPLIES) ×2 IMPLANT
FILTER TRNSF BLOOD MICAGGR LOW RSDL VOL LF  PALL SQ40 40UM PLSTR DISP (BLOOD) IMPLANT
FX OXY W HR ART FILTER ADVANCE (MISCELLANEOUS PT CARE ITEMS) ×1
GW AMPLATZSS .035IN 7CM 180CM_LXB FLPY TIP FLTWR COIL SS (WIRE) ×1
GW URO .035IN 180CM 7CM AMPLATZSS STR (WIRE) ×2 IMPLANT
HEMOCONCENTRATOR DHF06_020230801 CS/8 (PACK) IMPLANT
INSERT INTRACK CLAMP STRL LF  DISP (SURGICAL INSTRUMENTS) ×2 IMPLANT
INSERT STD TRACTION 66MM (INSTRUMENTS) ×1
INTROD SHTH 5FR KT SS COAT .01_610370 (GUIDING) ×2
KIT CATH INSERTION 18GA .038IN ART PERC 11 SCALPEL NEEDLE SYRG GW (PERFUSION/HEART SUPPLIES) ×2 IMPLANT
KIT CATH INSERTION 18GA .038IN_ART PERC 11 SCALPEL NEEDLE (PERFUSION/HEART SUPPLIES) ×2
KIT MICROINTRO 5FR SS PERC (GUIDING) ×2 IMPLANT
KIT PACING MYOWR MYOCARDIUM VN_TRC WRE TMPRY (KITS & TRAYS (DISPOSABLE)) ×6 IMPLANT
KIT PRESS MONITOR 3W STOPCOCK TRANSDUC FLUSH DEV MACDRP TRUWAVE 60IN 12IN STRL DISP MODL PX600F (CARDIAC) IMPLANT
KIT PRESS MONITOR 3W STOPCOCK_TRANSDUC FLSH DEV MACDRP (CARDIAC)
KIT SUT COR-KNT MINI CMB STRL (MISCELLANEOUS PT CARE ITEMS) ×1
KIT SUT COR-KNT MINI COMBO STRL (MISCELLANEOUS PT CARE ITEMS) ×2 IMPLANT
LINE ISOLATION EA 1K62R1 PK/10 (IV TUBING & ACCESSORIES) IMPLANT
NEEDLE SPINAL BLK 3.5IN 22GA QUINCKE REG WL POLYPROP QUINCKE TIP STRL LF  DISP (ANETHESIA SUPPLIES) ×2 IMPLANT
NEEDLE SPINAL BLK 3.5IN 22GA Q_UINCKE REG WL POLYPROP REG BVL (ANETHESIA SUPPLIES) ×2
OXGNTR ADULT HLS SET ADV 7 ATF TBG BIOLINE COAT 3/8 IN PERF (SUPP) IMPLANT
OXYGENATOR ID QUADROX BIOLINE (PERFUSION/HEART SUPPLIES) IMPLANT
OXYGENATOR PERF 3CXFX15RE (PERFUSION/HEART SUPPLIES)
PACK CUSTOM CARDIAC (CUSTOM TRAYS & PACK) ×1
PACK CUSTOM TUBING W/PUMP (Connecting Tubes/Misc) ×3 IMPLANT
PACK PRC STRL DISP HARV SMRT_PRP 2 LF (KITS & TRAYS (DISPOSABLE)) IMPLANT
PACK SURG CARDIAC NONST DISP LF (CUSTOM TRAYS & PACK) ×2
PACK SURG LOOP RVAD ALLEGHENY GN HOSPITAL (TUBE/TUBING & SUCTION SUPPLIES) IMPLANT
PACK TUBING ECMO CUSTOM (PACK) IMPLANT
PUMP CNTRF REV COAT DISP (PERFUSION/HEART SUPPLIES) IMPLANT
SENSOR CDI 500_20/CS CDI510H (INSTRUMENTS) ×1
SENSOR SHUNT CDI HEP 1.2ML SYS 500 STRL DISP (SURGICAL INSTRUMENTS) ×2 IMPLANT
SET AUTOTRANS WASH CHAMBER TUB_E AT1 CATS (IV TUBING & ACCESSORIES) ×3 IMPLANT
SET AUTOTRANSFUSION TUBING ATS_SUCTION LINE (Cautery Accessories) ×3 IMPLANT
SET BLOOD PUMP CENTRIMAG (PACK) IMPLANT
SET CARDIOPLEGIA DEL LINE 6FT (PERFUSION/HEART SUPPLIES)
SET CRDPLG 16 CONVOLUTION (PERFUSION/HEART SUPPLIES) ×3 IMPLANT
SET EXT 3/16IN 6FT LWVL LINE (PERFUSION/HEART SUPPLIES) IMPLANT
SET HLS ADV 7 AUTOTRANS TUBING_BIOLINE ADLT 3/8IN 600ML STRL (SUPP)
SET VENTRIC AST DEV CENTRIMAG_BLOOD PUMP (PACK)
SHEATH INTROD 10CM 5FR PINN .035IN PERI SS SNAPON DIL LOCK KINK RST SMOOTH TRNS SPRG COIL GW 2.5CM (INTRODUCER) ×2 IMPLANT
SHEATH INTROD PINNACLE 5FRX10_10/BX RSS501 (INTRODUCER) ×1
SOL IRRG 0.9% NACL 1000ML PLASTIC PR BTL ISTNC N-PYRG STRL LF (SOLUTIONS) ×8 IMPLANT
SOL IV VIAFLEX PLAS-LYTE A PH 7.4 TY 1 1000ML LF (SOLUTIONS) IMPLANT
SOLUTION IRRG NS 2F7124 1000CC_12/CS (SOLUTIONS) ×4
SOLUTION IV ELECTROLYTES_1000ML 2B2544X CS/14 (SOLUTIONS)
STOPCOCK PERF 1 WY UNDIR PURGE LINE STRL (PERFUSION/HEART SUPPLIES) IMPLANT
STOPCOCK PERF 1 WY UNDIR PURGE_LINE STRL (PERFUSION/HEART SUPPLIES)
SYRINGE LL 20ML LTX STRL MED (NEEDLES & SYRINGE SUPPLIES) ×1
TIP APPL 10CM 16GA 2 CANN (CANNULA) IMPLANT
TRAY CATH 16FR LUBRISIL CMP CR 2 CONN ADV FOLEY SAF FLOW URMTR SILVER LF (Drains/Resovoirs) ×2 IMPLANT
TRAY CATH 16FR LUBRISIL CMP CR_2 CONN ADV FOLEY SAF FLOW (Drains/Resovoirs) ×1
TUBING MONITORING PRESS 72IN_50-P172 (CUFF)
TUBING PERF 1/2X3/32X6FT_020466101 (PERFUSION/HEART SUPPLIES) IMPLANT
TUBING PERF 1/4X1/16X6FT_020463101 (PERFUSION/HEART SUPPLIES)
TUBING PERF 3/8X3/32X6FT_020465101 (PERFUSION/HEART SUPPLIES)
TUBING PERF PUMP (TUBE/TUBING & SUCTION SUPPLIES) IMPLANT
TUBING PERF PUMP .25INX1/16IN STD 72IN SEG STRL (PERFUSION/HEART SUPPLIES) IMPLANT
TUBING PERF PUMP 3/8INX3/32IN 72IN STD DRMTR 68 SHOR STRL (PERFUSION/HEART SUPPLIES) IMPLANT
TUBING PRESS MONITOR 72IN TRUWAVE MALE TO FEMALE CONN TRANSDUC STRL LF  DISP (CUFF) IMPLANT
TUBING RVAD LOOP (TUBE/TUBING & SUCTION SUPPLIES)
TUBING ~~LOC~~ PIGTAIL (TUBE/TUBING & SUCTION SUPPLIES)
VALVE AOR 21MM 30MM 19.4MM 14.7MM ONX CNFRM-X 2.84SQ CM 22.2MM HRT SW RING PTFE 11.9MM ×2 IMPLANT

## 2019-01-04 NOTE — Nurses Notes (Signed)
Patient admitted from OR s/p AVR. Pt connected to monitor and ventilator by RN and RT. Assessment, VS and cardiac profiles per flowsheet. Insulin gtt infusing; see MAR for current rates. L Brachial art line monitoring invasive BPs. Foley and chest tubes draining properly. Obtained labs, glucose level, ABG and CXR; awaiting results. Bair hugger placed. ACNP Noberto Retort at bedside to assess patient, aware of current VS. Will continue to monitor patient at this time.

## 2019-01-04 NOTE — Care Management Notes (Signed)
Attempted to meet with the pt to complete the initial assessment, however pt soundly sleeping following OR. Will follow-up to complete the initial assessment.  Jennette Dubin, MSW  [phone]

## 2019-01-04 NOTE — Brief Op Note (Signed)
[facility]                                                     BRIEF OPERATIVE NOTE    Patient Name: Zani, Kyllonen Number: [id number]  Date of Service: 01/04/2019   Date of Birth: [birth year]    All elements must be documented.    Pre-Operative Diagnosis:   Severe, symptomatic aortic stenosis   Post-Operative Diagnosis:   Severe symptomatic aortic stenosis     Procedure(s)/Description:    1. Aortic valve replacement with a 69mm On X mechanical valve via mini-sternotomy  Findings/Complexity (inherent to the procedure performed): Post-replacement valve gradient per TEE: 9 mmHg     Attending Surgeon: Dr. Lacinda Axon  Assistant(s): Delphina Cahill     Anesthesia Type: General  Estimated Blood Loss:  200 ml  Blood Given: Cell Saver  Fluids Given: see anesthesia record   Complications (not routinely expected or not inherent to difficulty/nature of procedure):none  Characteristic Event (routinely expected or inherent to the difficulty/nature of the procedure): none  Did the use of current and/or prior Anticoagulants impact the outcome of the case? no  Wound Class: Clean Wound: Uninfected operative wounds in which no inflammation occurred    Tubes: Chest Tube  Drains: None  Specimens/ Cultures: none  Implants:   1. 17mm On X mechanical valve in the aortic position   2. Temporary atrial and ventricular pacing wires          Disposition: ICU - intubated and hemodynamically stable.  Condition: stable    Francee Nodal, DO

## 2019-01-04 NOTE — Anesthesia Postprocedure Evaluation (Signed)
Anesthesia Post Op Evaluation    [name]  Procedure(s) with comments:  Mini sternotomy, aortic valve replacement with an undetermined valve type - Mini sternotomy, aortic valve replacement with On-X 30mm mechanical valve   PERFUSION CHARGE/STBY/CARDIOPULMONARY BYPASS  AUTOLOGOUS BLOOD CONSERVATION SETUP  AUTOLOGOUS BLOOD CONSERVATION, MONITORING PER HOUR    Last Vitals:Temperature: 36.8 C (98.2 F) (01/04/19 1900)  Heart Rate: 81 (01/04/19 1900)  BP (Non-Invasive): 127/72 (01/04/19 1900)  Respiratory Rate: (!) 11 (01/04/19 1900)  SpO2: 97 % (01/04/19 1345)    Patient is sufficiently recovered from the effects of anesthesia to participate in the evaluation and has returned to their pre-procedure level.  Patient location during evaluation: PACU   Post-procedure handoff checklist completed    Patient participation: complete - patient participated  Level of consciousness: awake and alert and responsive to verbal stimuli  Pain management: adequate  Airway patency: patent  Anesthetic complications: no  Cardiovascular status: acceptable  Respiratory status: acceptable  Hydration status: acceptable  Patient post-procedure temperature: Pt Normothermic   PONV Status: Absent

## 2019-01-04 NOTE — Anesthesia Procedure Notes (Signed)
Central Line Placement    Patient Location  Pt location: In OR  Consent  written and verbal from patient.  Protocol   Patient identity confirmed with verbally with patient, arm band, provided demographic data, hospital-assigned identification number and ID band.  Preprocedure hand washing was performed; sterile field was maintained . Time out performed:yes was performed to confirm correct patient and procedure. Ultrasound used for needle placement, imaging, supervision, and interpretation. Ultrasound image archived     Procedure   A 9 Fr Double lumen central line was inserted into the right Internal Jugular, for central pressure monitoring, multiple IV medications or infusions and vascular access. Patient was placed in  Trendelenburg .  Technique Guidewire, Direct puncture and Anatomical landmarks used guidewire used and guidewire removed    Number of attempts :1.Site was prepped with chlorhexidine gluconate and isopropyl alcohol, patient was sedated, and site local  was used. Line secured by Tape and Transparent dressing and line sutured at  cms. Total catheter length of         Line Readjusted: No      Post Placement  blood return through all ports, Dark color and free fluid flow  Complications(if any).  atraumatic  Performed By  Performing provider: Kandis Nab, DO  Authorizing provider: Theophilus Bones, MD  Comments

## 2019-01-04 NOTE — Progress Notes (Signed)
[facility]  HVI Critical Care Consult/Progress Note    [name], [name], 63 y.o. female  Date of Birth: September 13, 1956  Medical Record Number:  [id number]   Inpatient Admission Date: 01/04/2019   Hospital Day:  LOS: 0 days     Chief Complaint:  milddyspnea on exertion for the last few months.   History of Present Illness: 63 year old caucasian female who presents to our clinic for further surveillance of her ascending aortic aneurysm and aortic stenosis. She has a past medical history of known aortic stenosis, HTN, emphysema, former smoker, arthritis, S/P L THA and hernia repair. She had a CT chest on 11/25/17 that revealed ascending aortic aneurysm of 4.0. TTE on 11/25/17 revealed EF of 55%, no regional wall abnormalities, mild AI, mild MR and severe AS (mean of 43.8, VMAX of 400.8, AVA of 0.8).  She seen [place] on 12/01/18 for her 1 year follow up when her TTE revealed critical AS (mean of 79.7, VMAX 550.9 and AVA of 0.6). CT scan revealed ascending aorta dilation of approximately 4.0-4.1 cm. Cardiac cath on 12/06/18 revealed normal coronary arteries.  As for symptoms, patient reports only milddyspnea on exertion for the last few months.She reports mild dyspnea with her house work.She reports stress and activity can worsen symptoms while rest can help alleviate them. She also notes intermittent mild dizziness. She denies any chest pain, orthopnea, palpitations, syncope or edema.   POD #/ Procedure: S/P Aortic valve replacement with a 30mm On X mechanical valve via mini-sternotomy   Hospital Course/Major events:  3/18 extubated in OR, no drips   Past Medical History: Past Medical History:   Diagnosis Date   . 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 Aortic stenosis    . Arthritis    . Blood in stool     positive cologuard.  Has not yet scheduled colonoscopy   . Cancer (CMS HCC)     skin cancer L lip.  Needs MOHS to be scheduled   . Chronic pain    . Colon polyp    . Dyspnea on  exertion     with 2 flights   . Exercise intolerance    . Heart murmur     echo 02/2016, aortic stenosis   . Heartburn    . High blood pressure    . Motion sickness    . Nausea with vomiting    . Obesity    . Rash     scalp psorriais and involves R ear   . Valvular disease     aortic valve stenosis, following with CT surgery, echo 02/2016 and cath 03/2016   . Wears glasses        Past Surgical History: Past Surgical History:   Procedure Laterality Date   . HX CESAREAN SECTION  1984   . HX HEART CATHETERIZATION      x2 with no intervention per pt report   . HX HERNIA REPAIR  2004, 2006, 2008    x3   . HX HIP REPLACEMENT Left 08/11/2016   . HX HYSTERECTOMY  2003      Social History:  Social History     Socioeconomic History   . Marital status: Single     Spouse name: Not on file   . Number of children: Not on file   . Years of education: Not on file   . Highest education level: Not on file   Occupational History   .  Not on file   Social Needs   . Financial resource strain: Not hard at all   . Food insecurity     Worry: Never true     Inability: Never true   . Transportation needs     Medical: No     Non-medical: No   Tobacco Use   . Smoking status: Former Smoker     Packs/day: 1.00     Years: 35.00     Pack years: 35.00     Types: Cigarettes     Last attempt to quit: 2014     Years since quitting: 6.2   . Smokeless tobacco: Never Used   Substance and Sexual Activity   . Alcohol use: Yes     Frequency: Monthly or less     Comment: social only   . Drug use: No     Comment: CBD oil 15mg  edible   . Sexual activity: Not Currently   Lifestyle   . Physical activity     Days per week: 3 days     Minutes per session: 40 min   . Stress: To some extent   Relationships   . Social Product manager on phone: Three times a week     Gets together: Three times a week     Attends religious service: Never     Active member of club or organization: No     Attends meetings of clubs or organizations: Never     Relationship status:  Divorced   . Intimate partner violence     Fear of current or ex partner: No     Emotionally abused: No     Physically abused: No     Forced sexual activity: No   Other Topics Concern   . Ability to Walk 1 Flight of Steps without SOB/CP Yes   . Routine Exercise Not Asked   . Ability to Walk 2 Flight of Steps without SOB/CP No   . Unable to Ambulate Not Asked   . Total Care Not Asked   . Ability To Do Own ADL's Yes   . Uses Walker Not Asked   . Other Activity Level Not Asked   . Uses Cane Not Asked   Social History Narrative   . Not on file      Family History: Family Medical History:     Problem Relation (Age of Onset)    Atrial fibrillation Sister    Heart Disease Mother    Hypertension (High Blood Pressure) Father    Lung Cancer Father, Brother    Mitral valve prolapse Sister    Sudden Death no cause Mother             Allergies: Allergies   Allergen Reactions   . Iv Contrast Anaphylaxis   . Oregano      Hives     . Stadol [Butorphanol Tartrate] Mental Status Effect   . Tomato Hives/ Urticaria      ROS:  Other than ROS in the HPI, all other systems were negative.     Problem List:   Patient Active Problem List   Diagnosis   . Essential hypertension   . S/P hip replacement   . Low back pain radiating to left leg   . Severe aortic stenosis   . Aortic stenosis       Medications:  Scheduled Meds: acetaminophen, 1,000 mg, Once    Followed by  acetaminophen, 975 mg, Q6H  ascorbic acid (  vitamin C), 2,000 mg, Once    Followed by  [START ON 01/05/2019] ascorbic acid (vitamin C), 500 mg, 2x/day  aspirin, 81 mg, Daily  bupivacaine liposome (PF), 20 mL, INTRA-OP Once  cardioplegia initial solution A for microplegia, 1 Each, INTRA-OP Once  cardioplegia reperfusate solution C (HOT SHOT) for microplegia, 1 Each, INTRA-OP Once  ceFAZolin (ANCEF) IVPB, 2 g, Q8H  chlorhexidine gluconate, 15 mL, 2x/day  [START ON 01/05/2019] heparin, 5,000 Units, Q8HRS  mupirocin, , 2x/day  NS flush, 2 mL, Q8HRS  potassium chloride, 1 Each, INTRA-OP  Once      IV Infusions: electrolyte-A (PLASMALYTE-A)  insulin regular human (HUMULIN R) 250 units in NS 249mL infusion, Last Rate: Stopped (01/04/19 1330)  LR, Last Rate: 30 mL/hr at 01/04/19 0637      PRN:  [START ON 01/06/2019] acetaminophen, 650 mg, Q4H PRN  bisacodyL, 10 mg, Daily PRN  HYDROmorphone (DILAUDID) injection, 0.2 mg, Q4H PRN  ipratropium, 0.5 mg, Q4H PRN  levalbuterol HCL, 1.25 mg, Q4H PRN  magnesium hydroxide, 30 mL, Daily PRN  metoprolol tartrate, 6.25 mg, Once PRN  NS flush, 2-6 mL, Q1 MIN PRN  ondansetron, 4 mg, Q8H PRN  oxyCODONE, 5 mg, Q4H PRN  oxyCODONE, 10 mg, Q4H PRN        Last VS:    Temperature: 35.8 C (96.4 F)  Heart Rate: 85  Respiratory Rate: (!) 23  BP (Non-Invasive): 97/63      Comprehensive Physical Exam:    Lifestyle   Physical activity   . Days per week: 3 days   . Minutes per session: 40 min   Physical Exam   Constitutional: She is oriented to person, place, and time. No distress.   HENT:   Head: Normocephalic and atraumatic.   Eyes: Pupils are equal, round, and reactive to light.   Neck: Normal range of motion. No JVD present.   Cardiovascular: Normal rate and regular rhythm.   Pulmonary/Chest: Effort normal and breath sounds normal. No stridor. No respiratory distress. She has no wheezes.   Abdominal: Soft. She exhibits no distension. There is no abdominal tenderness.   Musculoskeletal: Normal range of motion.         General: No edema.   Neurological: She is alert and oriented to person, place, and time.   Skin: Skin is warm and dry. She is not diaphoretic.         [x] I have reviewed the patient's vitals signs, the nursing notes, the physician's notes and other progress notes     [x] I have reviewed the laboratory and imaging data    [x] I have discussed this case with Dr. Florene Glen and Dr. Lacinda Axon      Systems Based Assessment and Plan:    Neurologic:  Data/Assessment CAM: negative  GCS: 15   Diagnosis Incisional pain   Course: Critical   Plan PRN Morphine  Day/night  hygiene  Scheduled Tylenol       Cardiovascular:  Data/Assessment Most Recent Hemodynamics:  104/64  Cardiovascular support: NA  Temp pacer Wires: AV   Diagnosis No active diagnosis   Course Stable   Plan maintain MAP 65-85  Monitor for arrhythmias     Respiratory:  Data/Assessment: CTO #1: 60  CXR: Trace bilateral pleural effusions with bibasilar atelectasis.  No substantial pneumothorax.  Heart within normal limits of size given the patient's position.  Median sternotomy with the wires intact and aligned.  Most recent ABG: pending  Vent Settings: extubated   Diagnosis Atelectasis   Course  Critical   Plan pulm toilet  Wean O2 as tol         Renal:  Data/Assessment: Reviewed, foley in place   Diagnosis No active diagnosis   Course Stable   Plan Monitor BMP  Follow U/O       Gastrointestinal:  Data/Assessment: Famotidine (Pepcid)  for stress ulcer prophylaxis   Diet:  NPO   Bowel Regimen:  Colace   Diagnosis Not applicable   Course  Stable   Plan Once fully awake will start clear liquids     Endocrine:  Data/Assessment Last three Fingerstick glucose: 178/126  Last HbA1C: 5.2     Diagnosis No active diagnosis   Course Stable   Plan Aspart SSI qAC/HS  maintain BG<160       Hematologic:  Data/Assessment:  Reviewed, minimal chest tube drainage   Diagnosis No active diagnosis   Course Stable   Plan SCDs (Sequential Compression Device)  For DVT Prophylaxis     Infectious Diseases:  Data/Assessment Tmax last 24 hrs: Temp (24hrs) Max:36.2 C (97.2 F)       Diagnosis post op abx   Course Stable   Plan Antibiotic therapy  Agent:  Cefazolin (Ancef)  Indication:Empiric with antibiotic end date:   To be determined once cultures result.  Management:   ICU Team Managing.       Family Communication and Disposition:  Data/Assessment 3/18 family aware of patients condition, plan to D/C home             Critical Care E&M Attestation:  I have spent a total of 40 minutes evaluating and managing this complex, critically ill patient  excluding procedures.      Noberto Retort, APRN-ACNP-BC, 01/04/2019

## 2019-01-04 NOTE — Anesthesia Procedure Notes (Signed)
Arterial Line Procedure    Pt location: In OR  Consent:     Consent given by:  Patient  Universal protocol:     Procedure explained and questions answered to patient or proxy's satisfaction: yes      Patient identity confirmed:  Arm band, verbally with patient, provided demographic data and hospital-assigned identification number  Pre-procedure details:   Ultrasound used for needle placement, imaging, supervision, and interpretation. Ultrasound image archived Skin Prep used: Chlorhexidine gluconate and Isopropyl alcohol  Anesthesia (see MAR for exact dosages):     Anesthesia method:  local infiltration    Local anesthetic:  lidocaine 1% w/o epi   A 20 G Catheter type: Arrow in length,  Placed on the left  brachial artery  using anatomical landmarks With  number of attempts:1.Secured with: tape   MEDICATIONS:     Post-procedure details:    Patient tolerance of procedure:  Tolerated well, no immediate complications Waveform appropriate, Correlates with cuff, Flush/aspirate well and Calibrated  Complications:none  Performed By:  Performing provider: Kandis Nab, DO Authorizing provider: Theophilus Bones, MD

## 2019-01-04 NOTE — H&P (Signed)
[facility]      H&P [place], 63 y.o. female  Date of Admission:  01/04/2019  Date of Birth:  [birth year]    01/04/2019    STOP: IF H&P IS GREATER THAN 30 DAYS FROM SURGICAL DAY COMPLETE NEW H&P IS REQUIRED.     H & P updated the day of the procedure.  1.  H&P completed within 30 days by Dalbert Batman APRN/ Dr. Lacinda Axon MD on 12/29/2018) of surgical procedure and has been reviewed within 24 hours of admission but prior to surgery or a procedure requiring anesthesia services, the patient has been examined, and no change has occured in the patients condition since the H&P was completed.       Change in medications: No        No LMP recorded. Patient has had a hysterectomy.      Comments: We discussed mechanical vs bioprosthetic valve types and the patient has decided to proceed with mechanical valve.  We discussed the requirements of life long coumadin therapy and the risks of anticoagulation.  The patient verbalized understanding of these risks and wishes to proceed.  All questions/ concerns appropriately addressed with patient.     2.  Patient continues to be appropriate candidate for planned surgical procedure. YES    Lovena Le D'Etcheverry, PA-C

## 2019-01-04 NOTE — Anesthesia Transfer of Care (Signed)
ANESTHESIA TRANSFER OF CARE   [name] is a 63 y.o. ,female, Weight: 95.1 kg (209 lb 10.5 oz)   had Procedure(s) with comments:  Mini sternotomy, aortic valve replacement with an undetermined valve type - Mini sternotomy, aortic valve replacement with On-X 25mm mechanical valve   PERFUSION CHARGE/STBY/CARDIOPULMONARY BYPASS  AUTOLOGOUS BLOOD CONSERVATION SETUP  AUTOLOGOUS BLOOD CONSERVATION, MONITORING PER HOUR  performed  01/04/19   Primary Service: [name], MD    Past Medical History:   Diagnosis Date   . 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 Aortic stenosis    . Arthritis    . Blood in stool     positive cologuard.  Has not yet scheduled colonoscopy   . Cancer (CMS HCC)     skin cancer L lip.  Needs MOHS to be scheduled   . Chronic pain    . Colon polyp    . Dyspnea on exertion     with 2 flights   . Exercise intolerance    . Heart murmur     echo 02/2016, aortic stenosis   . Heartburn    . High blood pressure    . Motion sickness    . Nausea with vomiting    . Obesity    . Rash     scalp psorriais and involves R ear   . Valvular disease     aortic valve stenosis, following with CT surgery, echo 02/2016 and cath 03/2016   . Wears glasses       Allergy History as of 01/04/19     BUTORPHANOL TARTRATE       Noted Status Severity Type Reaction    01/08/16 1311 Flossie Buffy 01/08/16 Active   Mental Status Effect          IV CONTRAST       Noted Status Severity Type Reaction    01/08/16 1312 Flossie Buffy 01/08/16 Active High  Anaphylaxis          TOMATO       Noted Status Severity Type Reaction    01/08/16 1312 Flossie Buffy 01/08/16 Active Low  Hives/ Urticaria          OREGANO       Noted Status Severity Type Reaction    08/04/16 1129 Rozzell, Kelly Splinter, RN 01/08/16 Active       Comments:  Hives       01/08/16 1312 Flossie Buffy 01/08/16 Active                 I completed my transfer of care / handoff to the receiving personnel during which we discussed:  Access, All  key/critical aspects of case discussed, Antibiotics, Fluids/Product, Labs, Airway, Analgesia, Expectation of post procedure, Gave opportunity for questions and acknowledgement of understanding and PMHx    Post Location: ICU                                          Additional Info:[name] is a 63 y.o. transported to CVICU post operatively in stable condition and is extubated at this time.  All questions were answered for the CVICU team and signout was given to the CVICU nurse including but not limited to significant past medical history, allergies, clinical course during the surgery and post-operative considerations.    Roderic Palau A. Karlen Barbar, DO, MPH 01/04/2019,  13:29  PGY-3/CA-2 Department of Anesthesiology  Pager [phone]                          Last OR Temp: Temperature: 36.2 C (97.2 F)  ABG:  PCO2 (PCO2P)   Date Value Ref Range Status   01/04/2019 53 (HH) 35 - 45 mmHg Final     PO2 (PO2P)   Date Value Ref Range Status   01/04/2019 383 (H) 72 - 100 mmHg Final     POTASSIUM   Date Value Ref Range Status   12/29/2018 3.6 3.5 - 5.1 mmol/L Final     KETONES   Date Value Ref Range Status   12/29/2018 Negative Negative mg/dL Final     POTASSIUM, POC   Date Value Ref Range Status   01/04/2019 3.8 3.5 - 5.0 mmol/L Final     CALCIUM   Date Value Ref Range Status   12/29/2018 9.2 8.5 - 10.2 mg/dL Final     Calculated P Axis   Date Value Ref Range Status   12/29/2018 59 degrees Final     Calculated R Axis   Date Value Ref Range Status   12/29/2018 32 degrees Final     Calculated T Axis   Date Value Ref Range Status   12/29/2018 53 degrees Final     IONIZED CALCIUM, POC   Date Value Ref Range Status   01/04/2019 1.05 (L) 1.30 - 1.46 mmol/L Final     BASE EXCESS (BEP)   Date Value Ref Range Status   01/04/2019 3.0 -2.0 - 3.0 mEq/L Final     HCO3 (HCO3P)   Date Value Ref Range Status   01/04/2019 29 (H) 22 - 26 mmol/L Final     Airway:* No LDAs found *  Blood pressure (!) 142/99, pulse 96, temperature 36.2 C (97.2 F),  height 1.662 m (5' 5.43"), weight 95.1 kg (209 lb 10.5 oz), SpO2 97 %, not currently breastfeeding.

## 2019-01-04 NOTE — Anesthesia OR-ICU Handoff (Signed)
Anesthesia ICU Transfer of Care  [name] is a 63 y.o. ,female, Weight: 95.1 kg (209 lb 10.5 oz)   had Procedure(s) with comments:  Mini sternotomy, aortic valve replacement with an undetermined valve type - Mini sternotomy, aortic valve replacement with On-X 52m mechanical valve   PERFUSION CHARGE/STBY/CARDIOPULMONARY BYPASS  AUTOLOGOUS BLOOD CONSERVATION SETUP  AUTOLOGOUS BLOOD CONSERVATION, MONITORING PER HOUR  performed  01/04/19   Primary Service: CLyla Son MD    Past Medical History:   Diagnosis Date   . 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 Aortic stenosis    . Arthritis    . Blood in stool     positive cologuard.  Has not yet scheduled colonoscopy   . Cancer (CMS HCC)     skin cancer L lip.  Needs MOHS to be scheduled   . Chronic pain    . Colon polyp    . Dyspnea on exertion     with 2 flights   . Exercise intolerance    . Heart murmur     echo 02/2016, aortic stenosis   . Heartburn    . High blood pressure    . Motion sickness    . Nausea with vomiting    . Obesity    . Rash     scalp psorriais and involves R ear   . Valvular disease     aortic valve stenosis, following with CT surgery, echo 02/2016 and cath 03/2016   . Wears glasses       Allergy History as of 01/04/19     BUTORPHANOL TARTRATE       Noted Status Severity Type Reaction    01/08/16 1311 WFlossie Buffy03/22/17 Active   Mental Status Effect          IV CONTRAST       Noted Status Severity Type Reaction    01/08/16 1312 WFlossie Buffy03/22/17 Active High  Anaphylaxis          TOMATO       Noted Status Severity Type Reaction    01/08/16 1312 WFlossie Buffy03/22/17 Active Low  Hives/ Urticaria          OREGANO       Noted Status Severity Type Reaction    08/04/16 1129 RRich Reining RN 01/08/16 Active       Comments:  Hives       01/08/16 1312 WFlossie Buffy03/22/17 Active                 I completed my ICU transfer of care/ Handoff to the ICU receiving personnel during which we discussed :  All  key and critical aspects of case discussed                                                                     Last OR Temp: Temperature: 36.8 C (98.2 F)  ABG:  PH (ARTERIAL)   Date Value Ref Range Status   01/04/2019 7.33 (L) 7.35 - 7.45 Final     PCO2 (ARTERIAL)   Date Value Ref Range Status   01/04/2019 48.0 (H) 35.0 - 45.0 mm/Hg Final     PCO2 (PCO2P)  Date Value Ref Range Status   01/04/2019 53 (HH) 35 - 45 mmHg Final     PO2 (ARTERIAL)   Date Value Ref Range Status   01/04/2019 97.0 72.0 - 100.0 mm/Hg Final     PO2 (PO2P)   Date Value Ref Range Status   01/04/2019 383 (H) 72 - 100 mmHg Final     SODIUM   Date Value Ref Range Status   01/04/2019 134 (L) 137 - 145 mmol/L Final     POTASSIUM   Date Value Ref Range Status   01/04/2019 3.6 3.5 - 5.1 mmol/L Final     KETONES   Date Value Ref Range Status   12/29/2018 Negative Negative mg/dL Final     POTASSIUM, POC   Date Value Ref Range Status   01/04/2019 3.8 3.5 - 5.0 mmol/L Final     WHOLE BLOOD POTASSIUM   Date Value Ref Range Status   01/04/2019 3.5 3.5 - 4.6 mmol/L Final     CHLORIDE   Date Value Ref Range Status   01/04/2019 109 101 - 111 mmol/L Final     CALCIUM   Date Value Ref Range Status   12/29/2018 9.2 8.5 - 10.2 mg/dL Final     Calculated P Axis   Date Value Ref Range Status   01/04/2019 68 degrees Final     Calculated R Axis   Date Value Ref Range Status   01/04/2019 37 degrees Final     Calculated T Axis   Date Value Ref Range Status   01/04/2019 79 degrees Final     IONIZED CALCIUM   Date Value Ref Range Status   01/04/2019 1.24 1.10 - 1.35 mmol/L Final     IONIZED CALCIUM, POC   Date Value Ref Range Status   01/04/2019 1.05 (L) 1.30 - 1.46 mmol/L Final     LACTATE   Date Value Ref Range Status   01/04/2019 1.1 0.0 - 1.3 mmol/L Final     HEMOGLOBIN   Date Value Ref Range Status   01/04/2019 12.1 12.0 - 18.0 g/dL Final     OXYHEMOGLOBIN   Date Value Ref Range Status   01/04/2019 95.5 85.0 - 98.0 % Final     CARBOXYHEMOGLOBIN   Date Value Ref  Range Status   01/04/2019 1.5 0.0 - 2.5 % Final     MET-HEMOGLOBIN   Date Value Ref Range Status   01/04/2019 1.4 0.0 - 2.0 % Final     BASE EXCESS (BEP)   Date Value Ref Range Status   01/04/2019 3.0 -2.0 - 3.0 mEq/L Final     BASE DEFICIT   Date Value Ref Range Status   01/04/2019 1.1 0.0 - 3.0 mmol/L Final     BICARBONATE (ARTERIAL)   Date Value Ref Range Status   01/04/2019 24.0 18.0 - 26.0 mmol/L Final     HCO3 (HCO3P)   Date Value Ref Range Status   01/04/2019 29 (H) 22 - 26 mmol/L Final     Airway:* No LDAs found *

## 2019-01-04 NOTE — Nurses Notes (Signed)
Patient ambulated back to [facility] pre-post. Offered restroom. Changed into gown. Instructed on use of call bell and television remote. Vitals signs obtained. Attempting IV access and lab collection at this time. Awaiting healthcare provider to consent patient. Will prep accordingly.Will continue to monitor.

## 2019-01-04 NOTE — Anesthesia OR-ICU Handoff (Signed)
Anesthesia ICU Transfer of Care  [name] is a 63 y.o. ,female, Weight: 95.1 kg (209 lb 10.5 oz)   had Procedure(s) with comments:  Mini sternotomy, aortic valve replacement with an undetermined valve type - Mini sternotomy, aortic valve replacement with On-X 68mm mechanical valve   PERFUSION CHARGE/STBY/CARDIOPULMONARY BYPASS  AUTOLOGOUS BLOOD CONSERVATION SETUP  AUTOLOGOUS BLOOD CONSERVATION, MONITORING PER HOUR  performed  01/04/19   Primary Service: [name], MD    Past Medical History:   Diagnosis Date   . 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 Aortic stenosis    . Arthritis    . Blood in stool     positive cologuard.  Has not yet scheduled colonoscopy   . Cancer (CMS HCC)     skin cancer L lip.  Needs MOHS to be scheduled   . Chronic pain    . Colon polyp    . Dyspnea on exertion     with 2 flights   . Exercise intolerance    . Heart murmur     echo 02/2016, aortic stenosis   . Heartburn    . High blood pressure    . Motion sickness    . Nausea with vomiting    . Obesity    . Rash     scalp psorriais and involves R ear   . Valvular disease     aortic valve stenosis, following with CT surgery, echo 02/2016 and cath 03/2016   . Wears glasses       Allergy History as of 01/04/19     BUTORPHANOL TARTRATE       Noted Status Severity Type Reaction    01/08/16 1311 Flossie Buffy 01/08/16 Active   Mental Status Effect          IV CONTRAST       Noted Status Severity Type Reaction    01/08/16 1312 Flossie Buffy 01/08/16 Active High  Anaphylaxis          TOMATO       Noted Status Severity Type Reaction    01/08/16 1312 Flossie Buffy 01/08/16 Active Low  Hives/ Urticaria          OREGANO       Noted Status Severity Type Reaction    08/04/16 1129 Rozzell, Kelly Splinter, RN 01/08/16 Active       Comments:  Hives       01/08/16 1312 Flossie Buffy 01/08/16 Active                 I completed my ICU transfer of care/ Handoff to the ICU receiving personnel during which we discussed  :  Access, All key and critical aspects of case discussed, Fluids/Product, Labs, Antibiotics, Airway, Analgesia, Expectation of post procedure, Gave opportunity for questions and acknowledgement of understanding and PMHx                                                Additional Info:[name] is a 63 y.o. transported to CVICU post operatively in stable condition and is extubated at this time.  All questions were answered for the CVICU team and signout was given to the CVICU nurse including but not limited to significant past medical history, allergies, clinical course during the surgery and post-operative considerations.    Roderic Palau A.  Angelette Ganus, DO, MPH 01/04/2019, 13:29  PGY-3/CA-2 Department of Anesthesiology  Pager [phone]                       Last OR Temp: Temperature: 36.2 C (97.2 F)  ABG:  PCO2 (PCO2P)   Date Value Ref Range Status   01/04/2019 53 (HH) 35 - 45 mmHg Final     PO2 (PO2P)   Date Value Ref Range Status   01/04/2019 383 (H) 72 - 100 mmHg Final     POTASSIUM   Date Value Ref Range Status   12/29/2018 3.6 3.5 - 5.1 mmol/L Final     KETONES   Date Value Ref Range Status   12/29/2018 Negative Negative mg/dL Final     POTASSIUM, POC   Date Value Ref Range Status   01/04/2019 3.8 3.5 - 5.0 mmol/L Final     CALCIUM   Date Value Ref Range Status   12/29/2018 9.2 8.5 - 10.2 mg/dL Final     Calculated P Axis   Date Value Ref Range Status   12/29/2018 59 degrees Final     Calculated R Axis   Date Value Ref Range Status   12/29/2018 32 degrees Final     Calculated T Axis   Date Value Ref Range Status   12/29/2018 53 degrees Final     IONIZED CALCIUM, POC   Date Value Ref Range Status   01/04/2019 1.05 (L) 1.30 - 1.46 mmol/L Final     BASE EXCESS (BEP)   Date Value Ref Range Status   01/04/2019 3.0 -2.0 - 3.0 mEq/L Final     HCO3 (HCO3P)   Date Value Ref Range Status   01/04/2019 29 (H) 22 - 26 mmol/L Final     Airway:* No LDAs found *

## 2019-01-04 NOTE — Ancillary Notes (Signed)
.  Patient not ambulated due to recent surgical intervention. Will see in am when activity orders begin, if appropriate.    Any questions call Cardiac Rehabilitation, below.    [facility]  Cardiac Rehabilitation  Phone # [phone]  ;

## 2019-01-05 ENCOUNTER — Encounter (HOSPITAL_COMMUNITY): Payer: Self-pay

## 2019-01-05 ENCOUNTER — Inpatient Hospital Stay (HOSPITAL_COMMUNITY): Payer: 59

## 2019-01-05 DIAGNOSIS — R918 Other nonspecific abnormal finding of lung field: Secondary | ICD-10-CM

## 2019-01-05 DIAGNOSIS — D500 Iron deficiency anemia secondary to blood loss (chronic): Secondary | ICD-10-CM

## 2019-01-05 DIAGNOSIS — Z9889 Other specified postprocedural states: Secondary | ICD-10-CM

## 2019-01-05 DIAGNOSIS — J90 Pleural effusion, not elsewhere classified: Secondary | ICD-10-CM

## 2019-01-05 DIAGNOSIS — Z978 Presence of other specified devices: Secondary | ICD-10-CM

## 2019-01-05 DIAGNOSIS — Z95828 Presence of other vascular implants and grafts: Secondary | ICD-10-CM

## 2019-01-05 LAB — CBC WITH DIFF
BASOPHIL #: <0.1 10*3/uL (ref <=0.20)
BASOPHIL %: 0 %
EOSINOPHIL #: <0.1 10*3/uL (ref <=0.50)
EOSINOPHIL %: 0 %
HCT: 31.4 % — ABNORMAL LOW (ref 34.8–46.0)
HGB: 10.5 g/dL — ABNORMAL LOW (ref 11.5–16.0)
IMMATURE GRANULOCYTE #: <0.1 x10ˆ3/uL (ref <0.10)
IMMATURE GRANULOCYTE %: 1 % (ref 0–1)
LYMPHOCYTE #: 1.05 x10ˆ3/uL (ref 1.00–4.80)
LYMPHOCYTE %: 11 %
MCH: 30.2 pg (ref 26.0–32.0)
MCHC: 33.4 g/dL (ref 31.0–35.5)
MCV: 90.2 fL (ref 78.0–100.0)
MONOCYTE #: 0.82 x10ˆ3/uL (ref 0.20–1.10)
MONOCYTE %: 9 %
MPV: 8.8 fL (ref 8.7–12.5)
NEUTROPHIL #: 7.38 x10ˆ3/uL (ref 1.50–7.70)
NEUTROPHIL %: 79 %
PLATELETS: 165 x10ˆ3/uL (ref 150–400)
RBC: 3.48 x10ˆ6/uL — ABNORMAL LOW (ref 3.85–5.22)
RDW-CV: 11.9 % (ref 11.5–15.5)
WBC: 9.3 x10ˆ3/uL (ref 3.7–11.0)

## 2019-01-05 LAB — POC BLOOD GLUCOSE (RESULTS)
GLUCOSE, POC: 116 mg/dL — ABNORMAL HIGH (ref 70–105)
GLUCOSE, POC: 125 mg/dL — ABNORMAL HIGH (ref 70–105)
GLUCOSE, POC: 128 mg/dL — ABNORMAL HIGH (ref 70–105)
GLUCOSE, POC: 95 mg/dL (ref 70–105)

## 2019-01-05 LAB — BASIC METABOLIC PANEL
ANION GAP: 7 mmol/L (ref 4–13)
BUN/CREA RATIO: 18 (ref 6–22)
BUN: 12 mg/dL (ref 8–25)
CALCIUM: 8.1 mg/dL — ABNORMAL LOW (ref 8.5–10.2)
CHLORIDE: 104 mmol/L (ref 96–111)
CO2 TOTAL: 25 mmol/L (ref 22–32)
CREATININE: 0.68 mg/dL (ref 0.49–1.10)
ESTIMATED GFR: >60 mL/min/1.73mˆ2 (ref >60)
GLUCOSE: 104 mg/dL (ref 65–139)
POTASSIUM: 4 mmol/L (ref 3.5–5.1)
SODIUM: 136 mmol/L (ref 136–145)

## 2019-01-05 LAB — PT/INR
INR: 1.27 — ABNORMAL HIGH (ref 0.80–1.20)
PROTHROMBIN TIME: 14.7 s — ABNORMAL HIGH (ref 9.1–13.9)

## 2019-01-05 LAB — IONIZED CALCIUM WITH PH
IONIZED CALCIUM: 1.13 mmol/L (ref 1.10–1.35)
PH (VENOUS): 7.45 — ABNORMAL HIGH (ref 7.31–7.41)

## 2019-01-05 LAB — HISTORICAL SURGICAL PATHOLOGY SPECIMEN

## 2019-01-05 LAB — MAGNESIUM: MAGNESIUM: 2.3 mg/dL (ref 1.6–2.6)

## 2019-01-05 MED ORDER — DOCUSATE SODIUM 100 MG CAPSULE
100.0000 mg | ORAL_CAPSULE | Freq: Two times a day (BID) | ORAL | Status: DC
Start: 2019-01-05 — End: 2019-01-09
  Administered 2019-01-05 – 2019-01-07 (×5): 100 mg via ORAL
  Administered 2019-01-07 – 2019-01-09 (×4): 0 mg via ORAL
  Filled 2019-01-05 (×9): qty 1

## 2019-01-05 MED ORDER — METOPROLOL TARTRATE 12.5 MG HALF TABLET
12.5000 mg | ORAL_TABLET | Freq: Two times a day (BID) | ORAL | Status: DC
Start: 2019-01-05 — End: 2019-01-08
  Administered 2019-01-05 – 2019-01-08 (×6): 12.5 mg via ORAL
  Filled 2019-01-05 (×7): qty 1

## 2019-01-05 MED ORDER — INSULIN LISPRO 100 UNIT/ML SUB-Q SSIP
0.00 [IU] | INJECTION | Freq: Four times a day (QID) | SUBCUTANEOUS | Status: DC | PRN
Start: 2019-01-05 — End: 2019-01-10

## 2019-01-05 MED ORDER — ALBUMIN, HUMAN 5 % INTRAVENOUS SOLUTION
INTRAVENOUS | Status: AC
Start: 2019-01-05 — End: 2019-01-05
  Administered 2019-01-05: 0 g via INTRAVENOUS
  Administered 2019-01-05: 12.5 g via INTRAVENOUS
  Filled 2019-01-05: qty 250

## 2019-01-05 MED ORDER — NYSTATIN 100,000 UNIT/GRAM TOPICAL POWDER
Freq: Two times a day (BID) | CUTANEOUS | Status: DC
Start: 2019-01-05 — End: 2019-01-10
  Administered 2019-01-08 – 2019-01-10 (×3): 0 via TOPICAL
  Filled 2019-01-05: qty 15

## 2019-01-05 MED ORDER — ALBUMIN, HUMAN 5 % INTRAVENOUS SOLUTION
12.5000 g | INTRAVENOUS | Status: AC
Start: 2019-01-05 — End: 2019-01-05
  Administered 2019-01-05: 0 g via INTRAVENOUS
  Administered 2019-01-05: 12.5 g via INTRAVENOUS

## 2019-01-05 MED ORDER — POLYETHYLENE GLYCOL 3350 17 GRAM ORAL POWDER PACKET
17.0000 g | Freq: Every day | ORAL | Status: DC
Start: 2019-01-05 — End: 2019-01-10
  Administered 2019-01-05 – 2019-01-07 (×3): 17 g via ORAL
  Administered 2019-01-08 – 2019-01-09 (×2): 0 g via ORAL
  Administered 2019-01-10: 17 g via ORAL
  Filled 2019-01-05 (×5): qty 1

## 2019-01-05 MED ORDER — WARFARIN 5 MG TABLET
5.0000 mg | ORAL_TABLET | Freq: Once | ORAL | Status: AC
Start: 2019-01-05 — End: 2019-01-05
  Administered 2019-01-05 (×2): 5 mg via ORAL
  Filled 2019-01-05: qty 1

## 2019-01-05 MED ORDER — ALBUMIN, HUMAN 5 % INTRAVENOUS SOLUTION
12.5000 g | Freq: Once | INTRAVENOUS | Status: AC
Start: 2019-01-05 — End: 2019-01-05
  Administered 2019-01-05: 0 g via INTRAVENOUS
  Administered 2019-01-05: 12.5 g via INTRAVENOUS
  Filled 2019-01-05: qty 250

## 2019-01-05 MED ORDER — PROCHLORPERAZINE EDISYLATE 10 MG/2 ML (5 MG/ML) INJECTION SOLUTION
5.00 mg | Freq: Four times a day (QID) | INTRAMUSCULAR | Status: DC | PRN
Start: 2019-01-05 — End: 2019-01-06
  Administered 2019-01-05 – 2019-01-06 (×2): 5 mg via INTRAVENOUS
  Filled 2019-01-05 (×3): qty 2

## 2019-01-05 MED ORDER — SODIUM CHLORIDE 0.9 % INTRAVENOUS SOLUTION
1.0000 g | INTRAVENOUS | Status: AC
Start: 2019-01-05 — End: 2019-01-05
  Administered 2019-01-05: 0 g via INTRAVENOUS
  Administered 2019-01-05: 1 g via INTRAVENOUS
  Filled 2019-01-05: qty 10

## 2019-01-05 MED ORDER — KETOROLAC 30 MG/ML (1 ML) INJECTION SOLUTION
15.00 mg | Freq: Four times a day (QID) | INTRAMUSCULAR | Status: AC | PRN
Start: 2019-01-05 — End: 2019-01-05
  Administered 2019-01-05: 15 mg via INTRAVENOUS
  Filled 2019-01-05: qty 1

## 2019-01-05 MED ADMIN — sodium chloride 0.9 % (flush) injection syringe: @ 22:00:00

## 2019-01-05 MED ADMIN — HYDROmorphone 2 mg/mL injection syringe: INTRAVENOUS | @ 05:00:00

## 2019-01-05 MED ADMIN — ascorbic acid (vitamin C) 500 mg tablet: ORAL | @ 22:00:00

## 2019-01-05 MED ADMIN — heparin (porcine) 5,000 unit/mL injection solution: SUBCUTANEOUS | @ 11:00:00

## 2019-01-05 MED ADMIN — mupirocin 2 % topical ointment: TOPICAL | @ 08:00:00

## 2019-01-05 MED ADMIN — metoprolol tartrate 25 mg tablet: ORAL | @ 22:00:00

## 2019-01-05 MED ADMIN — sodium chloride 0.9 % (flush) injection syringe: INTRAVENOUS | @ 05:00:00

## 2019-01-05 MED ADMIN — prochlorperazine Edisylate 10 mg/2 mL (5 mg/mL) injection solution: INTRAVENOUS | @ 20:00:00

## 2019-01-05 MED ADMIN — chlorhexidine gluconate 0.12 % mouthwash: TOPICAL | @ 08:00:00

## 2019-01-05 MED ADMIN — HYDROcodone 5 mg-acetaminophen 325 mg tablet: @ 22:00:00

## 2019-01-05 MED ADMIN — sodium chloride 0.9 % (flush) injection syringe: ORAL | @ 09:00:00

## 2019-01-05 MED ADMIN — oxyCODONE-acetaminophen 5 mg-325 mg tablet: ORAL | @ 11:00:00

## 2019-01-05 MED ADMIN — diphenhydrAMINE 50 mg/mL injection solution: @ 14:00:00

## 2019-01-05 MED ADMIN — sodium chloride 0.9 % (flush) injection syringe: INTRAVENOUS | @ 01:00:00

## 2019-01-05 MED ADMIN — fentaNYL (PF) 50 mcg/mL injection solution: ORAL | @ 06:00:00

## 2019-01-05 MED ADMIN — dextrose 5 % in water (D5W) intravenous solution: INTRAVENOUS | @ 11:00:00

## 2019-01-05 MED ADMIN — pantoprazole 40 mg tablet,delayed release: SUBCUTANEOUS | @ 22:00:00

## 2019-01-05 MED ADMIN — sodium chloride 0.9 % (flush) injection syringe: INTRAVENOUS | @ 08:00:00

## 2019-01-05 NOTE — OR Surgeon (Signed)
PATIENT NAME: [name] NUMBER:  [id number]  DATE OF SERVICE: 01/04/2019  DATE OF BIRTH:  June 01, 1956    OPERATIVE REPORT    PREOPERATIVE DIAGNOSIS:  Severe aortic stenosis with preserved biventricular function.    POSTOPERATIVE DIAGNOSIS:  Severe aortic stenosis with preserved biventricular function.    NAME OF PROCEDURE:  Minimally invasive aortic valve replacement via upper partial sternotomy with a 21 mm On-X mechanical valve.    SURGEON:  [name], MD, FACS, [facility]    ASSISTANT:  Francee Nodal, DO (PGY-7).    ANESTHESIA:  General endotracheal.    INDICATIONS FOR PROCEDURE:  [name] is a 63 year old female who has been followed for severe aortic stenosis.  Over the last 2 years, her aortic stenosis has progressed to severe with an aortic valve area of less than 0.8.  Her most recent echocardiogram revealed a mean gradient of 79.7 with an aortic valve area of 0.6 cm2.  Given this, she was encouraged to undergo aortic valve replacement.  Her operative risk for aortic valve replacement was 2.334% mortality and 14.812% major morbidity or mortality.  Given this, I recommended she undergo surgical aortic valve replacement.  After discussion of the implications of lifetime anticoagulation versus bioprosthetic valve choice, she elected undergo mechanical valve.    INTRAOPERATIVE FINDINGS:  1. Heavily calcified aortic valve with 3 distinct cusps and effusion at the commissures.    2. Postoperative mean gradient of 7.    DESCRIPTION OF PROCEDURE:  The patient was taken to the main operating room and placed in a supine position where general endotracheal anesthesia was administered.  Transesophageal echocardiogram revealed severe aortic stenosis and preserved biventricular function.  The chest, abdomen, and lower extremities were prepped and draped in the usual sterile fashion.  The chest was entered through an upper partial sternotomy with a J incision into the fourth intercostal space.   The sternum was made hemostatic.  The mediastinal adipose was resected.  The pericardium was opened and suspended.  The patient was fully heparinized.  Cannulation consisted of an arterial cannula in the distal ascending aorta, a 2-stage venous cannula placed via the right atrial appendage, an aortic root vent, and a retrograde cardioplegia catheter placed into the coronary sinus.  The operative field was flooded with carbon dioxide.  Cardiopulmonary bypass was initiated.  A left ventricular vent was placed via the right superior pulmonary vein.  The aorta was crossclamped and cold blood cardioplegia was administered using a combination of antegrade and retrograde techniques.  Complete cardiac arrest was achieved.   An aortotomy was performed above the sinotubular junction and extended obliquely into the noncoronary sinus.  Traction sutures were placed above the commissures.  The cusps were excised and the annulus was debrided.  The annulus was sized to a 21 mm On-X valve.     Cold blood cardioplegia was administered throughout a significant portion of the case and in a near continuous fashion and intermittently at least 20 minute intervals.  A series of nonpledgeted 2-0 Ethibond sutures were placed circumferentially in the annulus.  The sutures were then passed through the valve which was seated and tied securely in place using the Cor-Knot device.  The aortotomy was then closed in a 2-layered fashion with a 4-0 Prolene suture using inner mattress and outer running technique as de-airing maneuvers were performed.  A hot shot was administered using retrograde and antegrade technique.  The cross-clamp was removed.     Two chest tubes were delivered  from the subxiphoid space and used to deliver the pacing wires.  Pacing wires were placed on the right atrium and right ventricle.  The heart regained a normal spontaneous sinus rhythm.  She did not require pacing.  The patient was ventilated and weaned from  cardiopulmonary bypass.  Transesophageal echocardiogram revealed normal function to the mechanical valve without evidence of perivalvular leak.  The vents were removed once complete de-airing was ensured.  The venous cannula was removed and protamine was administered as volume was returned by the arterial cannula.  The cannulation sites, anastomotic sites, and sternum were ensured for hemostasis.  The sternal wires were placed and ensured for hemostasis.  The sternum was reapproximated without changes in hemodynamics.  The fascia, subdermal tissues, and skin were closed in 3 layers with running absorbable sutures.  Sterile dressings were subsequently applied.  This concluded the procedure.  Sponge, instrument, and needle counts were correct x2.    COMPLICATIONS:  None.    SPECIMEN:  Aortic valve.    DISPOSITION:  The patient tolerated the procedure well.  She was sent to the cardiovascular ICU in stable condition.    ATTESTATION:  This is Dr. Marin Shutter dictating.  I performed this operation with the assistance of PGY-7, Francee Nodal, DO.  Dr. Delphina Cahill served as the first assistant throughout the operation, through to completion and closure.      [name], MD, FACS, [facility]  Associate Professor   Cardiovascular and Thoracic Surgery            DD:  01/05/2019 09:27:20  DT:  01/05/2019 12:16:34 CB  D#:  [id number]

## 2019-01-05 NOTE — Progress Notes (Signed)
[facility]  HVI Critical Care Consult/Progress Note    [name], [name], 63 y.o. female  Date of Birth: 05-03-1956  Medical Record Number:  [id number]   Inpatient Admission Date: 01/04/2019   Hospital Day:  LOS: 1 day     Chief Complaint:  med mgmt s/p AVR   History of Present Illness: 63 year old caucasian female who presents to our clinic for further surveillance of her ascending aortic aneurysm and aortic stenosis. She has a past medical history of known aortic stenosis, HTN, emphysema, former smoker, arthritis, S/P L THA and hernia repair. She had a CT chest on 11/25/17 that revealed ascending aortic aneurysm of 4.0. TTE on 11/25/17 revealed EF of 55%, no regional wall abnormalities, mild AI, mild MR and severe AS (mean of 43.8, VMAX of 400.8, AVA of 0.8).  She seen [place] on 12/01/18 for her 1 year follow up when her TTE revealed critical AS (mean of 79.7, VMAX 550.9 and AVA of 0.6). CT scan revealed ascending aorta dilation of approximately 4.0-4.1 cm. Cardiac cath on 12/06/18 revealed normal coronary arteries.  As for symptoms, patient reports only milddyspnea on exertion for the last few months.She reports mild dyspnea with her house work.She reports stress and activity can worsen symptoms while rest can help alleviate them. She also notes intermittent mild dizziness. She denies any chest pain, orthopnea, palpitations, syncope or edema.   POD #/ Procedure: 3/18:   Aortic valve replacement with a 8mm On X mechanical valve via mini-sternotomy     Hospital Course/Major events:  3/18 extubated in OR, no drips  3/19: Off all drips. No acute issues overnight. Plan for step down today.     Past Medical History: Past Medical History:   Diagnosis Date   . 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 Aortic stenosis    . Arthritis    . Blood in stool     positive cologuard.  Has not yet scheduled colonoscopy   . Cancer (CMS HCC)     skin cancer L lip.  Needs MOHS to be scheduled   .  Chronic pain    . Colon polyp    . Dyspnea on exertion     with 2 flights   . Exercise intolerance    . Heart murmur     echo 02/2016, aortic stenosis   . Heartburn    . High blood pressure    . Motion sickness    . Nausea with vomiting    . Obesity    . Rash     scalp psorriais and involves R ear   . Valvular disease     aortic valve stenosis, following with CT surgery, echo 02/2016 and cath 03/2016   . Wears glasses        Past Surgical History: Past Surgical History:   Procedure Laterality Date   . HX CESAREAN SECTION  1984   . HX HEART CATHETERIZATION      x2 with no intervention per pt report   . HX HERNIA REPAIR  2004, 2006, 2008    x3   . HX HIP REPLACEMENT Left 08/11/2016   . HX HYSTERECTOMY  2003      Social History:  Social History     Socioeconomic History   . Marital status: Single     Spouse name: Not on file   . Number of children: Not on file   . Years of education: Not on  file   . Highest education level: Not on file   Occupational History   . Not on file   Social Needs   . Financial resource strain: Not hard at all   . Food insecurity     Worry: Never true     Inability: Never true   . Transportation needs     Medical: No     Non-medical: No   Tobacco Use   . Smoking status: Former Smoker     Packs/day: 1.00     Years: 35.00     Pack years: 35.00     Types: Cigarettes     Last attempt to quit: 2014     Years since quitting: 6.2   . Smokeless tobacco: Never Used   Substance and Sexual Activity   . Alcohol use: Yes     Frequency: Monthly or less     Comment: social only   . Drug use: No     Comment: CBD oil 15mg  edible   . Sexual activity: Not Currently   Lifestyle   . Physical activity     Days per week: 3 days     Minutes per session: 40 min   . Stress: To some extent   Relationships   . Social Product manager on phone: Three times a week     Gets together: Three times a week     Attends religious service: Never     Active member of club or organization: No     Attends meetings of clubs or  organizations: Never     Relationship status: Divorced   . Intimate partner violence     Fear of current or ex partner: No     Emotionally abused: No     Physically abused: No     Forced sexual activity: No   Other Topics Concern   . Ability to Walk 1 Flight of Steps without SOB/CP Yes   . Routine Exercise Not Asked   . Ability to Walk 2 Flight of Steps without SOB/CP No   . Unable to Ambulate Not Asked   . Total Care Not Asked   . Ability To Do Own ADL's Yes   . Uses Walker Not Asked   . Other Activity Level Not Asked   . Uses Cane Not Asked   Social History Narrative   . Not on file      Family History: Family Medical History:     Problem Relation (Age of Onset)    Atrial fibrillation Sister    Heart Disease Mother    Hypertension (High Blood Pressure) Father    Lung Cancer Father, Brother    Mitral valve prolapse Sister    Sudden Death no cause Mother             Allergies: Allergies   Allergen Reactions   . Iv Contrast Anaphylaxis   . Oregano      Hives     . Stadol [Butorphanol Tartrate] Mental Status Effect   . Tomato Hives/ Urticaria      ROS:  Other than ROS in the HPI, all other systems were negative.     Problem List:   Patient Active Problem List   Diagnosis   . Essential hypertension   . S/P hip replacement   . Low back pain radiating to left leg   . Severe aortic stenosis   . Aortic stenosis       Medications:  Scheduled Meds:  acetaminophen, 975 mg, Q6H  ascorbic acid (vitamin C), 500 mg, 2x/day  aspirin, 81 mg, Daily  bupivacaine liposome (PF), 20 mL, INTRA-OP Once  cardioplegia initial solution A for microplegia, 1 Each, INTRA-OP Once  cardioplegia reperfusate solution C (HOT SHOT) for microplegia, 1 Each, INTRA-OP Once  ceFAZolin (ANCEF) IVPB, 2 g, Q8H  chlorhexidine gluconate, 15 mL, 2x/day  heparin, 5,000 Units, Q8HRS  mupirocin, , 2x/day  NS flush, 2 mL, Q8HRS  potassium chloride, 1 Each, INTRA-OP Once      IV Infusions:    PRN:  [START ON 01/06/2019] acetaminophen, 650 mg, Q4H PRN  bisacodyL, 10  mg, Daily PRN  HYDROmorphone (DILAUDID) injection, 0.2 mg, Q4H PRN  ipratropium, 0.5 mg, Q4H PRN  ketorolac, 15 mg, Q6H PRN  levalbuterol HCL, 1.25 mg, Q4H PRN  magnesium hydroxide, 30 mL, Daily PRN  metoprolol tartrate, 6.25 mg, Once PRN  NS flush, 2-6 mL, Q1 MIN PRN  ondansetron, 4 mg, Q8H PRN  oxyCODONE, 5 mg, Q4H PRN  oxyCODONE, 10 mg, Q4H PRN  prochlorperazine, 5 mg, Q6H PRN  insulin lispro, 0-12 Units, Q4H PRN        Last VS:    Temperature: 36.4 C (97.6 F)  Heart Rate: 88  Respiratory Rate: 18  BP (Non-Invasive): (!) 144/83      Comprehensive Physical Exam:    Lifestyle   Physical activity   . Days per week: 3 days   . Minutes per session: 40 min   Physical Exam   Constitutional: She is oriented to person, place, and time. No distress.   HENT:   Head: Normocephalic and atraumatic.   Eyes: Pupils are equal, round, and reactive to light.   Neck: Normal range of motion. No JVD present.   Cardiovascular: Normal rate and regular rhythm.   Pulmonary/Chest: Effort normal and breath sounds normal. No stridor. No respiratory distress. She has no wheezes.   Abdominal: Soft. She exhibits no distension. There is no abdominal tenderness.   Musculoskeletal: Normal range of motion.         General: No edema.   Neurological: She is alert and oriented to person, place, and time.   Skin: Skin is warm and dry. She is not diaphoretic.         [x] I have reviewed the patient's vitals signs, the nursing notes, the physician's notes and other progress notes     [x] I have reviewed the laboratory and imaging data    [x] I have discussed this case with Dr. Florene Glen and Dr. Lacinda Axon      Systems Based Assessment and Plan:    Neurologic:  Data/Assessment CAM: negative  GCS: 15   Diagnosis Incisional pain   Course: Stable   Plan Serial neuro exams  Day/night hygiene  Scheduled Tylenol  PT/OT   Cardiac rehab     Cardiovascular:  Data/Assessment Most Recent Hemodynamics:  Reviewed     Diagnosis AS s/p AVR 3/18   Course Stable   Plan maintain MAP  65-85  Monitor for arrhythmias  Cardiac meds: Asa, statin     Respiratory:  Data/Assessment: CTO #1: reviewed     Diagnosis Atelectasis   Course Stable   Plan Wean FiO2  pulm toilet  Chest tubes continue to suction; assess for removal       Renal:  Data/Assessment: Reviewed   Diagnosis No active diagnosis   Course Stable   Plan Monitor BMP  Follow U/O  Replace electrolytes  Strict I&Os  D/C foley  Gastrointestinal:  Data/Assessment: Famotidine (Pepcid)  for stress ulcer prophylaxis   Diet:  Taking PO   Bowel Regimen:  Colace   Diagnosis Not applicable   Course  Stable   Plan Advance diet  Bowel regimen     Endocrine:  Data/Assessment Last three Fingerstick glucose: 178/126  Last HbA1C: 5.2     Diagnosis No active diagnosis   Course Stable   Plan Aspart SSI qAC/HS  maintain BG<160       Hematologic:  Data/Assessment:  Reviewed, minimal chest tube drainage   Diagnosis Surgical blood loss anemia   Course Stable   Plan Subcutaneous Heparin  For DVT Prophylaxis  Monitor H&H       Infectious Diseases:  Data/Assessment Tmax last 24 hrs: Temp (24hrs) Max:37.5 C (99.5 F)       Diagnosis post op abx   Course Stable   Plan Antibiotic therapy  Agent:  Cefazolin (Ancef)  Indication:Surgical prophylaxis with antibiotic end date of:   01/06/19.  Currently being manage by ICU team.  Monitor for s/sx of infection     Family Communication and Disposition:  Data/Assessment 3/18 family aware of patients condition, plan to D/C home         Boone Master, APRN,AGACNP-BC  01/05/2019, 01:19    I saw and examined the patient at bedside and discussed the patient with the CVICU and CTS team.  I agree with the documentation provided and the note was edited.  The care of this patient was in regard to managing the conditions listed above.  The data reviewed and care planning were performed in direct proximity of the patient.  Medications, allergies, vital signs, lab tests, imaging, nursing notes and physician notes have been reviewed.          Claiborne Rigg, MD  HVI Critical Care

## 2019-01-05 NOTE — Nurses Notes (Signed)
Report received from Wonda Cheng, Therapist, sports. Kardex, Labs, MAR, and plan of care reviewed. Assessment and VS per doc flow sheet. Will continue to monitor.

## 2019-01-05 NOTE — Care Plan (Signed)
Discharge Plan:  Home with Home Health (code 6)  Pt a scheduled admission from home for surgery. Per chart review, POD#1 s/p AVR. Chest tubes remain in place, and pt on room air. Pt with orders for step-down status. MSW [place] met with the pt at bedside to complete the initial assessment. Pt lives in a basement apartment of her daughter and son-in-law's home, where she has her own bedroom, bathroom, and mini-kitchenette. Pt's family will provide transportation at d/c. Pt confirmed her insurance/prescription coverage, pharmacy of preference, and PCP. Pt's last appointment with her PCP, Lillie Columbia, APRN, was about 2 weeks prior to admission. Pt was not using HH or DME prior to admission. Pt stated that her advance directives are on file, however a copy could not be located in Epic; copy requested from the pt. Pt declined to appoint a lay caregiver.     In-basket consult to Care Management for d/c needs s/p cardiac surgery. Anticipate pt will require labs following d/c. Pt's HH benefits were verified by CM Assistant; pt met deductible and out-of-pocket, and is covered 100%. This MSW discussed the need for labs following d/c, reviewed the d/c options of HH vs outpatient labs, and informed of the need to be homebound to qualify for [facility]. Pt verbalized agreement/understanding. Pt requesting to use [facility] services. Provided patient with Allscripts listing with CMS ratings for Home Health serving the [phone] zip code. After review, pt provided choice for 1) [facility] and 2) [facility]. Initial HH referral information sent via Allscripts to [facility], and [facility] liaison Elmyra Ricks informed of the new referral; referral being reviewed. CCC G. Owens Shark assisted in placing [facility] order; will send order once available. Consult closed. No additional needs identified at this time. Anticipate pt to d/c home with [facility] once medically stable. MSW Jearld Shines wrote her contact information on the pt's whiteboard, and will continue to  follow.

## 2019-01-05 NOTE — Care Management Notes (Signed)
[place] Management Initial Evaluation    Patient Name: [name]  Date of Birth: Sep 29, 1956  Sex: female  Date/Time of Admission: 01/04/2019  6:11 AM  Room/Bed: 04/A  Payor: Ferdinand Cava / Plan: [employer] [place] / Product Type: Managed Care /   Primary Care Providers:  Derek Jack, APRN,FNP-B* (General)    Pharmacy Info:   Preferred Pharmacy     [facility]    [place] [address]    Phone: [phone] Fax: [phone]    Not a 24 hour pharmacy; exact hours not known.        Emergency Contact Info:   Extended Emergency Contact Information  Primary Emergency Contact: Allie [place]  Mobile Phone: [phone]  Relation: Daughter    History:   [name] is a 63 y.o., female, admitted with aortic stenosis    Height/Weight: 166.2 cm (5' 5.43") / 96 kg (211 lb 10.3 oz)     LOS: 1 day   Admitting Diagnosis: Aortic stenosis [I35.0]    Assessment:      01/05/19 0920   Assessment Details   Assessment Type Admission   Date of Care Management Update 01/05/19   Date of Next DCP Update 01/06/19   Readmission   Is this a readmission? No   Care Management Plan   Discharge Planning Status initial meeting   Projected Discharge Date 01/09/19   Discharge plan discussed with: Patient   CM will evaluate for rehabilitation potential yes   Patient choice offered to patient/family yes   Form for patient choice reviewed/signed and on chart no   Facility or Agency Preferences [facility]   Patient aware of possible cost for ambulance transport?  No   Discharge Needs Assessment   Equipment Currently Used at Home none   Equipment Needed After Discharge none   Discharge Facility/Level of Care Needs Home with Home Health (code 6)   Transportation Available car;family or friend will provide   Referral Information   Admission Type inpatient   Address Verified verified-no changes   Arrived From home  or self-care   Insurance Verified verified-no change   ADVANCE DIRECTIVES   Does the Patient have an Advance Directive? Yes, Patient Does Have Advance Directive for Healthcare Treatment   Copy of Advance Directives in Chart? 6   Name of MPOA or Healthcare Surrogate n/a   Phone Number of MPOA or Healthcare Surrogate n/a   Patient Requests Assistance in Having Advance Directive Notarized. N/A   LAY CAREGIVER    Appointed Lay Caregiver? I Decline   Employment/Financial   Patient has Prescription Coverage?  Yes        Name of Insurance Coverage for Medications [employer]   Financial Concerns none   Living Environment   Select an age group to open "lives with" row.  Adult   Lives With child(ren), adult;grandchild(ren);other (see comments)  (Son-in-law)   Living Arrangements house   Able to Return to Prior Arrangements yes   Living Arrangement Comments Pt lives in the basement of her daughter and son-in-law's home.   Home Safety   Home Assessment: No Problems Identified   Home Accessibility bed and bath on same level;no concerns   Legal Issues   Do you have a court appointed guardian/conservator? No       Discharge Plan:  Home with Home Health (code 6)  Pt a scheduled admission from home for  surgery. Per chart review, POD#1 s/p AVR. Chest tubes remain in place, and pt on room air. Pt with orders for step-down status. MSW Pence met with the pt at bedside to complete the initial assessment. Pt lives in a basement apartment of her daughter and son-in-law's home, where she has her own bedroom, bathroom, and mini-kitchenette. Pt's family will provide transportation at d/c. Pt confirmed her insurance/prescription coverage, pharmacy of preference, and PCP. Pt's last appointment with her PCP, Lillie Columbia, APRN, was about 2 weeks prior to admission. Pt was not using HH or DME prior to admission. Pt stated that her advance directives are on file, however a copy could not be located in Epic; copy requested from the pt. Pt  declined to appoint a lay caregiver.     In-basket consult to Care Management for d/c needs s/p cardiac surgery. Anticipate pt will require labs following d/c. Pt's HH benefits were verified by CM Assistant; pt met deductible and out-of-pocket, and is covered 100%. This MSW discussed the need for labs following d/c, reviewed the d/c options of HH vs outpatient labs, and informed of the need to be homebound to qualify for [facility]. Pt verbalized agreement/understanding. Pt requesting to use [facility] services. Provided patient with Allscripts listing with CMS ratings for Home Health serving the [phone] zip code. After review, pt provided choice for 1) [facility] and 2) [facility]. Initial HH referral information sent via Allscripts to [facility], and [facility] liaison Elmyra Ricks informed of the new referral; referral being reviewed. CCC G. Owens Shark assisted in placing [facility] order; will send order once available. Consult closed. No additional needs identified at this time. Anticipate pt to d/c home with [facility] once medically stable. MSW Jearld Shines wrote her contact information on the pt's whiteboard, and will continue to follow.      The patient will continue to be evaluated for developing discharge needs.     Case Manager: Elease Hashimoto, [place]  Phone: [phone]

## 2019-01-05 NOTE — Care Plan (Signed)
Pt on room air.  Pt doing PEP Q4 WA for airway clearance and is getting 750 mLs on Incentive spirometer.  Will continue to monitor respiratory status and continue to encourage pt to deep breath and cough.

## 2019-01-05 NOTE — Ancillary Notes (Signed)
[facility]  Cardiac Rehab Exercise Information      [name], Haney, 63 y.o.   Date of Birth:  06-17-1956  Allergies:   Allergies   Allergen Reactions   . Iv Contrast Anaphylaxis   . Oregano      Hives     . Stadol [Butorphanol Tartrate] Mental Status Effect   . Tomato Hives/ Urticaria       EXERCISE INFORMATION:  Resting heart rate: 85    Resting blood pressure: 108/69    Oxygen Liter flow (L/min): 0 L/min    Resting SpO2: 95 %    Exercise type: Ambulate (2nd ambulation today)    Distance: 137ft standing breaks    Rate of perceived exertion: 15/20    Pain scale: 6/10    No data recorded  Assistance: x4 UNused chair    Maximum heart rate: 96    Recovery heart rate: 93    Recovery blood pressure: 125/73    Recovery SpO2: 97         **See Cardiac Exercise Flowsheet for further details**    [facility] Medicine  Cardiac Rehabilitation  [name]  [phone]

## 2019-01-06 ENCOUNTER — Inpatient Hospital Stay (HOSPITAL_COMMUNITY): Payer: 59

## 2019-01-06 ENCOUNTER — Encounter (HOSPITAL_COMMUNITY): Payer: Self-pay

## 2019-01-06 DIAGNOSIS — D696 Thrombocytopenia, unspecified: Secondary | ICD-10-CM

## 2019-01-06 DIAGNOSIS — J9811 Atelectasis: Secondary | ICD-10-CM

## 2019-01-06 DIAGNOSIS — Z9889 Other specified postprocedural states: Secondary | ICD-10-CM

## 2019-01-06 DIAGNOSIS — Z978 Presence of other specified devices: Secondary | ICD-10-CM

## 2019-01-06 DIAGNOSIS — J90 Pleural effusion, not elsewhere classified: Secondary | ICD-10-CM

## 2019-01-06 LAB — MAGNESIUM: MAGNESIUM: 2 mg/dL (ref 1.6–2.6)

## 2019-01-06 LAB — CBC WITH DIFF
BASOPHIL #: <0.1 x10ˆ3/uL (ref <=0.20)
BASOPHIL %: 0 %
BASOPHIL %: 0 %
EOSINOPHIL #: <0.1 x10ˆ3/uL (ref <=0.50)
EOSINOPHIL %: 0 %
HCT: 27.7 % — ABNORMAL LOW (ref 34.8–46.0)
HGB: 9.2 g/dL — ABNORMAL LOW (ref 11.5–16.0)
IMMATURE GRANULOCYTE #: <0.1 10*3/uL (ref <0.10)
IMMATURE GRANULOCYTE %: 0 % (ref 0–1)
LYMPHOCYTE #: 1.65 10*3/uL (ref 1.00–4.80)
LYMPHOCYTE %: 17 %
MCH: 30.5 pg (ref 26.0–32.0)
MCHC: 33.2 g/dL (ref 31.0–35.5)
MCV: 91.7 fL (ref 78.0–100.0)
MONOCYTE #: 0.8 10*3/uL (ref 0.20–1.10)
MONOCYTE %: 9 %
MPV: 8.9 fL (ref 8.7–12.5)
NEUTROPHIL #: 6.95 10*3/uL (ref 1.50–7.70)
NEUTROPHIL %: 74 %
PLATELETS: 143 10*3/uL — ABNORMAL LOW (ref 150–400)
RBC: 3.02 x10ˆ6/uL — ABNORMAL LOW (ref 3.85–5.22)
RDW-CV: 12 % (ref 11.5–15.5)
WBC: 9.5 10*3/uL (ref 3.7–11.0)

## 2019-01-06 LAB — PT/INR
INR: 2.14 — ABNORMAL HIGH (ref 0.80–1.20)
INR: 2.14 — ABNORMAL HIGH (ref 0.80–1.20)
PROTHROMBIN TIME: 25.2 s — ABNORMAL HIGH (ref 9.1–13.9)

## 2019-01-06 LAB — POC BLOOD GLUCOSE (RESULTS)
GLUCOSE, POC: 114 mg/dL — ABNORMAL HIGH (ref 70–105)
GLUCOSE, POC: 123 mg/dL — ABNORMAL HIGH (ref 70–105)
GLUCOSE, POC: 123 mg/dL — ABNORMAL HIGH (ref 70–105)
GLUCOSE, POC: 107 mg/dL — ABNORMAL HIGH (ref 70–105)

## 2019-01-06 LAB — CREATININE WITH EGFR
CREATININE: 0.74 mg/dL (ref 0.49–1.10)
ESTIMATED GFR: >60 mL/min/1.73mˆ2 (ref >60)

## 2019-01-06 LAB — ELECTROLYTES
ANION GAP: 6 mmol/L (ref 4–13)
CHLORIDE: 100 mmol/L (ref 96–111)
CO2 TOTAL: 24 mmol/L (ref 22–32)
POTASSIUM: 4.2 mmol/L (ref 3.5–5.1)
SODIUM: 130 mmol/L — ABNORMAL LOW (ref 136–145)

## 2019-01-06 LAB — BUN: BUN: 19 mg/dL (ref 8–25)

## 2019-01-06 LAB — IONIZED CALCIUM WITH PH
IONIZED CALCIUM: 1.14 mmol/L (ref 1.10–1.35)
PH (VENOUS): 7.45 — ABNORMAL HIGH (ref 7.31–7.41)

## 2019-01-06 MED ORDER — ACETAMINOPHEN 1,000 MG/100 ML (10 MG/ML) INTRAVENOUS SOLUTION
1000.0000 mg | Freq: Once | INTRAVENOUS | Status: AC
Start: 2019-01-06 — End: 2019-01-06
  Administered 2019-01-06: 0 mg via INTRAVENOUS
  Administered 2019-01-06: 1000 mg via INTRAVENOUS
  Filled 2019-01-06: qty 100

## 2019-01-06 MED ORDER — SODIUM CHLORIDE 0.9 % INTRAVENOUS SOLUTION
1.0000 g | INJECTION | INTRAVENOUS | Status: AC | PRN
Start: 2019-01-06 — End: 2019-01-06
  Administered 2019-01-06: 1 g via INTRAVENOUS
  Administered 2019-01-06: 0 g via INTRAVENOUS
  Filled 2019-01-06: qty 10

## 2019-01-06 MED ORDER — METOCLOPRAMIDE 5 MG/ML INJECTION SOLUTION
5.0000 mg | Freq: Four times a day (QID) | INTRAMUSCULAR | Status: AC
Start: 2019-01-06 — End: 2019-01-07
  Administered 2019-01-06 – 2019-01-07 (×4): 5 mg via INTRAVENOUS
  Filled 2019-01-06 (×6): qty 2

## 2019-01-06 MED ORDER — LIDOCAINE 3.6 %-MENTHOL 1.25 % TOPICAL PATCH
1.0000 | MEDICATED_PATCH | Freq: Every day | CUTANEOUS | Status: DC
Start: 2019-01-06 — End: 2019-01-10
  Administered 2019-01-06 – 2019-01-10 (×5): 1 via TRANSDERMAL
  Filled 2019-01-06 (×5): qty 1

## 2019-01-06 MED ORDER — KETOROLAC 30 MG/ML (1 ML) INJECTION SOLUTION
15.0000 mg | Freq: Four times a day (QID) | INTRAMUSCULAR | Status: AC | PRN
Start: 2019-01-06 — End: 2019-01-07
  Administered 2019-01-06 – 2019-01-07 (×4): 15 mg via INTRAVENOUS
  Filled 2019-01-06 (×5): qty 1

## 2019-01-06 MED ADMIN — mupirocin 2 % topical ointment: TOPICAL | @ 21:00:00

## 2019-01-06 MED ADMIN — lidocaine 4 %-menthoL 1 % topical patch: TRANSDERMAL | @ 09:00:00

## 2019-01-06 MED ADMIN — ascorbic acid (vitamin C) 500 mg tablet: ORAL | @ 08:00:00

## 2019-01-06 MED ADMIN — acetaminophen 325 mg tablet: ORAL | @ 13:00:00

## 2019-01-06 MED ADMIN — lidocaine 4 %-menthoL 1 % topical patch: TRANSDERMAL | @ 21:00:00

## 2019-01-06 MED ADMIN — sodium chloride 0.9 % (flush) injection syringe: @ 15:00:00

## 2019-01-06 MED ADMIN — sodium chloride 0.9 % intravenous solution: ORAL | @ 08:00:00

## 2019-01-06 MED ADMIN — sodium chloride 0.9 % (flush) injection syringe: ORAL | @ 21:00:00

## 2019-01-06 MED ADMIN — enoxaparin 80 mg/0.8 mL subcutaneous syringe: ORAL | @ 08:00:00

## 2019-01-06 MED ADMIN — sodium chloride 0.9 % (flush) injection syringe: ORAL | @ 08:00:00

## 2019-01-06 MED ADMIN — lactated Ringers intravenous solution: ORAL | @ 21:00:00

## 2019-01-06 MED ADMIN — sodium chloride 0.9 % (flush) injection syringe: INTRAVENOUS | @ 08:00:00

## 2019-01-06 NOTE — Ancillary Notes (Signed)
[facility]  Cardiac Rehab Exercise Information      [name], [name], 63 y.o.   Date of Birth:  04/11/1956  Allergies:   Allergies   Allergen Reactions   . Iv Contrast Anaphylaxis   . Oregano      Hives     . Stadol [Butorphanol Tartrate] Mental Status Effect   . Tomato Hives/ Urticaria       EXERCISE INFORMATION:  Resting heart rate: 91    Resting blood pressure: 110/66    Oxygen Liter flow (L/min): 0 L/min    Resting SpO2: 96 %    Exercise type: Ambulate    Distance: 154 ft standing break x1    Rate of perceived exertion: 13/20    Pain scale: 5/10    Symptoms: Shortness of breath    Assistance: x2    Maximum heart rate: 106    Recovery heart rate: 99    Recovery blood pressure: 132/70    Recovery SpO2: [place] EP  [phone]

## 2019-01-06 NOTE — Care Plan (Signed)
Patient denied chest pain, SOB, and all other pain. OOB and ambulation continued. Fall and skin precautions maintained. Frequent repositioning encouraged. Sitter select active and audible. Ambulated in room today with assist of 1; up in chair for majority of the day. Instructed to call for assistance when ambulating. Call bell within reach. VSS. Plan to pull CT today or tomorrow.  Dressing changes completed and CDI.  Emotional support provided to patient. Patient prefers straws with her liquids. POC updated with pt.     Problem: Adult Inpatient Plan of Care  Goal: Plan of Care Review  Outcome: Ongoing (see interventions/notes)  Goal: Patient-Specific Goal (Individualized)  Outcome: Ongoing (see interventions/notes)  Goal: Absence of Hospital-Acquired Illness or Injury  Outcome: Ongoing (see interventions/notes)  Goal: Optimal Comfort and Wellbeing  Outcome: Ongoing (see interventions/notes)  Goal: Rounds/Family Conference  Outcome: Ongoing (see interventions/notes)     Problem: Fall Injury Risk  Goal: Absence of Fall and Fall-Related Injury  Outcome: Ongoing (see interventions/notes)     Problem: Skin Injury Risk Increased  Goal: Skin Health and Integrity  Outcome: Ongoing (see interventions/notes)

## 2019-01-06 NOTE — Care Management Notes (Signed)
[facility]  Care Management Note    Patient Name: [name]  Date of Birth: May 03, 1956  Sex: female  Date/Time of Admission: 01/04/2019  6:11 AM  Room/Bed: 04/A  Payor: [employer] / Plan: [employer] OF [place] / Product Type: Managed Care /    LOS: 2 days   Primary Care Providers:  Derek Jack, APRN,FNP-B* (General)    Admitting Diagnosis:  Aortic stenosis [I35.0]    Assessment:      01/06/19 0919   Assessment Detail   Assessment Type Continued Assessment   Date of Care Management Update 01/06/19   Date of Next DCP Update 01/09/19   Social Work Plan   Discharge Planning Status plan in progress   Projected Discharge Date 01/09/19   Anticipated Discharge Disposition Home with [facility] with [place]   CM will evaluate for rehabilitation potential yes   Patient choice offered to patient/family yes   Form for patient choice reviewed/signed and on chart yes   Facility or Agency Preferences   ([facility])   Patient/Family in Agreement with Plan yes   Discharge Needs Assessment   Discharge Facility/Level of Care Needs Home with Home Health (code 6)   Transportation Available car;family or friend will provide       Discharge Plan:  Home with Home Health (code 6)  Per morning report, POD#1 s/p AVR. Chest tubes remain in place, assessing for removal. Pt with orders for step-down status. HH order remains pended; and awaiting physician signature. Per [facility] HH liaison Elmyra Ricks and Allscripts response, pt has been accepted by [facility]. This MSW met with the pt at bedside and informed that [facility] had accepted; pt verbalized agreement/understanding. Freedom of Choice form reviewed, signed, and placed in [place] to be scanned to pt's chart. AVS updated with the number for report. Anticipate pt to d/c home with [facility], once medically stable. MSW Jearld Shines will continue to follow.     The patient will continue to  be evaluated for developing discharge needs.     Case Manager: Elease Hashimoto, [place]  Phone: [phone]

## 2019-01-06 NOTE — Progress Notes (Signed)
[facility]  HVI Critical Care Consult/Progress Note    [name], [name], 63 y.o. female  Date of Birth: 01-20-1956  Medical Record Number:  [id number]   Inpatient Admission Date: 01/04/2019   Hospital Day:  LOS: 2 days     Chief Complaint:  med mgmt s/p AVR   History of Present Illness: per previous provider, " 63 year old caucasian female who presents to our clinic for further surveillance of her ascending aortic aneurysm and aortic stenosis. She has a past medical history of known aortic stenosis, HTN, emphysema, former smoker, arthritis, S/P L THA and hernia repair. She had a CT chest on 11/25/17 that revealed ascending aortic aneurysm of 4.0. TTE on 11/25/17 revealed EF of 55%, no regional wall abnormalities, mild AI, mild MR and severe AS (mean of 43.8, VMAX of 400.8, AVA of 0.8).  She seen [place] on 12/01/18 for her 1 year follow up when her TTE revealed critical AS (mean of 79.7, VMAX 550.9 and AVA of 0.6). CT scan revealed ascending aorta dilation of approximately 4.0-4.1 cm. Cardiac cath on 12/06/18 revealed normal coronary arteries.  As for symptoms, patient reports only milddyspnea on exertion for the last few months.She reports mild dyspnea with her house work.She reports stress and activity can worsen symptoms while rest can help alleviate them. She also notes intermittent mild dizziness. She denies any chest pain, orthopnea, palpitations, syncope or edema. "     POD #/ Procedure: 3/18:   Aortic valve replacement with a 30mm On X mechanical valve via mini-sternotomy     Hospital Course/Major events:  3/18 extubated in OR, no drips  3/19: Off all drips. No acute issues overnight. Plan for step down today.  3/20: awaits stepdown bed     Past Medical History: Past Medical History:   Diagnosis Date   . 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 Aortic stenosis    . Arthritis    . Blood in stool     positive cologuard.  Has not yet scheduled colonoscopy   . Cancer (CMS HCC)      skin cancer L lip.  Needs MOHS to be scheduled   . Chronic pain    . Colon polyp    . Dyspnea on exertion     with 2 flights   . Exercise intolerance    . Heart murmur     echo 02/2016, aortic stenosis   . Heartburn    . High blood pressure    . Motion sickness    . Nausea with vomiting    . Obesity    . Rash     scalp psorriais and involves R ear   . Valvular disease     aortic valve stenosis, following with CT surgery, echo 02/2016 and cath 03/2016   . Wears glasses        Past Surgical History: Past Surgical History:   Procedure Laterality Date   . HX CESAREAN SECTION  1984   . HX HEART CATHETERIZATION      x2 with no intervention per pt report   . HX HERNIA REPAIR  2004, 2006, 2008    x3   . HX HIP REPLACEMENT Left 08/11/2016   . HX HYSTERECTOMY  2003      Social History:  Social History     Socioeconomic History   . Marital status: Single     Spouse name: Not on file   . Number of  children: Not on file   . Years of education: Not on file   . Highest education level: Not on file   Occupational History   . Not on file   Social Needs   . Financial resource strain: Not hard at all   . Food insecurity     Worry: Never true     Inability: Never true   . Transportation needs     Medical: No     Non-medical: No   Tobacco Use   . Smoking status: Former Smoker     Packs/day: 1.00     Years: 35.00     Pack years: 35.00     Types: Cigarettes     Last attempt to quit: 2014     Years since quitting: 6.2   . Smokeless tobacco: Never Used   Substance and Sexual Activity   . Alcohol use: Yes     Frequency: Monthly or less     Comment: social only   . Drug use: No     Comment: CBD oil 15mg  edible   . Sexual activity: Not Currently   Lifestyle   . Physical activity     Days per week: 3 days     Minutes per session: 40 min   . Stress: To some extent   Relationships   . Social Product manager on phone: Three times a week     Gets together: Three times a week     Attends religious service: Never     Active member of club or  organization: No     Attends meetings of clubs or organizations: Never     Relationship status: Divorced   . Intimate partner violence     Fear of current or ex partner: No     Emotionally abused: No     Physically abused: No     Forced sexual activity: No   Other Topics Concern   . Ability to Walk 1 Flight of Steps without SOB/CP Yes   . Routine Exercise Not Asked   . Ability to Walk 2 Flight of Steps without SOB/CP No   . Unable to Ambulate Not Asked   . Total Care Not Asked   . Ability To Do Own ADL's Yes   . Uses Walker Not Asked   . Other Activity Level Not Asked   . Uses Cane Not Asked   Social History Narrative   . Not on file      Family History: Family Medical History:     Problem Relation (Age of Onset)    Atrial fibrillation Sister    Heart Disease Mother    Hypertension (High Blood Pressure) Father    Lung Cancer Father, Brother    Mitral valve prolapse Sister    Sudden Death no cause Mother             Allergies: Allergies   Allergen Reactions   . Iv Contrast Anaphylaxis   . Oregano      Hives     . Stadol [Butorphanol Tartrate] Mental Status Effect   . Tomato Hives/ Urticaria      ROS:  No chest pain, dyspnea, abd pain, n/v.  Other than ROS in the HPI, all other systems were negative.     Problem List:   Patient Active Problem List   Diagnosis   . Essential hypertension   . S/P hip replacement   . Low back pain radiating to left leg   .  Severe aortic stenosis   . Aortic stenosis       Medications:  Scheduled Meds: acetaminophen, 975 mg, Q6H  ascorbic acid (vitamin C), 500 mg, 2x/day  aspirin, 81 mg, Daily  ceFAZolin (ANCEF) IVPB, 2 g, Q8H  docusate sodium, 100 mg, 2x/day  heparin, 5,000 Units, Q8HRS  metoprolol tartrate, 12.5 mg, 2x/day  mupirocin, , 2x/day  NS flush, 2 mL, Q8HRS  nystatin, , 2x/day  polyethylene glycol, 17 g, Daily      IV Infusions:    PRN:  acetaminophen, 650 mg, Q4H PRN  bisacodyL, 10 mg, Daily PRN  calcium chloride (CaCl) IVPB, 1 g, Q1H PRN  ipratropium, 0.5 mg, Q4H  PRN  levalbuterol HCL, 1.25 mg, Q4H PRN  magnesium hydroxide, 30 mL, Daily PRN  NS flush, 2-6 mL, Q1 MIN PRN  oxyCODONE, 5 mg, Q4H PRN  oxyCODONE, 10 mg, Q4H PRN  prochlorperazine, 5 mg, Q6H PRN  insulin lispro, 0-12 Units, 4x/day PRN        Last VS:    Temperature: 37 C (98.6 F)  Heart Rate: 83  Respiratory Rate: (!) 24  BP (Non-Invasive): 139/74      Comprehensive Physical Exam:    Lifestyle   Physical activity   . Days per week: 3 days   . Minutes per session: 40 min   Physical Exam   Constitutional: She is oriented to person, place, and time. No distress.   HENT:   Head: Normocephalic and atraumatic.   Eyes: Pupils are equal, round, and reactive to light.   Neck: Normal range of motion. No JVD present.   Cardiovascular: Normal rate and regular rhythm.   Pulmonary/Chest: Effort normal and breath sounds normal. No stridor. No respiratory distress. She has no wheezes.   Abdominal: Soft. She exhibits no distension. There is no abdominal tenderness.   Musculoskeletal: Normal range of motion.         General: No edema.   Neurological: She is alert and oriented to person, place, and time.   Skin: Skin is warm and dry. She is not diaphoretic.         [x] I have reviewed the patient's vitals signs, the nursing notes, the physician's notes and other progress notes     [x] I have reviewed the laboratory and imaging data        Systems Based Assessment and Plan:    Neurologic:  Data/Assessment CAM: negative  GCS: 15   Diagnosis Incisional pain   Course: Stable   Plan Serial neuro exams  Day/night hygiene  Scheduled Tylenol  PT/OT   Cardiac rehab     Cardiovascular:  Data/Assessment Most Recent Hemodynamics:  Reviewed     Diagnosis AS s/p AVR 3/18   Course Stable   Plan maintain MAP 65-85  Monitor for arrhythmias  Cardiac meds: Diona Fanti, BB     Respiratory:  Data/Assessment: CTO #1: reviewed     Diagnosis Atelectasis   Course Stable   Plan Wean FiO2  pulm toilet  Chest tubes continue to suction; assess for removal        Renal:  Data/Assessment: Reviewed   Diagnosis No active diagnosis   Course Stable   Plan Monitor BMP  Replace electrolytes  Strict I&Os     Gastrointestinal:  Data/Assessment: Famotidine (Pepcid)  for stress ulcer prophylaxis   Diet:  Taking PO   Bowel Regimen:  Colace   Diagnosis Not applicable   Course  Stable   Plan Advance diet  Bowel regimen  Endocrine:  Data/Assessment Last three Fingerstick glucose: 178/126  Last HbA1C: 5.2     Diagnosis No active diagnosis   Course Stable   Plan Aspart SSI qAC/HS  maintain BG<160       Hematologic:  Data/Assessment:  Reviewed, minimal chest tube drainage   Diagnosis Surgical blood loss anemia and Thrombocytopenia   Course Stable   Plan Subcutaneous Heparin  For DVT Prophylaxis  Monitor H&H       Infectious Diseases:  Data/Assessment Tmax last 24 hrs: Temp (24hrs) Max:37.6 C (99.7 F)       Diagnosis post op abx   Course Stable   Plan Antibiotic therapy  Agent:  Cefazolin (Ancef)  Indication:Surgical prophylaxis with antibiotic end date of:   01/06/19.  Currently being manage by ICU team.  Monitor for s/sx of infection         I saw and examined the patient at bedside and discussed the patient with the CVICU and CTS team.  The care of this patient was in regard to managing the conditions listed above.  The data reviewed and care planning were performed in direct proximity of the patient.  Medications, allergies, vital signs, lab tests, imaging, nursing notes and physician notes have been reviewed.     Claiborne Rigg, MD  HVI Critical Care

## 2019-01-06 NOTE — Care Management Notes (Signed)
Pt made step-down status and transferred to 9SE. Hand-off given to CCC Zelnis ([phone]), who will continue to follow.  Bharath Bernstein, MSW  [phone]

## 2019-01-07 ENCOUNTER — Inpatient Hospital Stay (HOSPITAL_COMMUNITY): Payer: 59

## 2019-01-07 DIAGNOSIS — R609 Edema, unspecified: Secondary | ICD-10-CM

## 2019-01-07 DIAGNOSIS — R918 Other nonspecific abnormal finding of lung field: Secondary | ICD-10-CM

## 2019-01-07 DIAGNOSIS — R112 Nausea with vomiting, unspecified: Secondary | ICD-10-CM

## 2019-01-07 DIAGNOSIS — I97190 Other postprocedural cardiac functional disturbances following cardiac surgery: Secondary | ICD-10-CM

## 2019-01-07 DIAGNOSIS — J90 Pleural effusion, not elsewhere classified: Secondary | ICD-10-CM

## 2019-01-07 DIAGNOSIS — Z9889 Other specified postprocedural states: Secondary | ICD-10-CM

## 2019-01-07 DIAGNOSIS — I517 Cardiomegaly: Secondary | ICD-10-CM

## 2019-01-07 LAB — POC BLOOD GLUCOSE (RESULTS)
GLUCOSE, POC: 81 mg/dL (ref 70–105)
GLUCOSE, POC: 89 mg/dL (ref 70–105)
GLUCOSE, POC: 94 mg/dL (ref 70–105)
GLUCOSE, POC: 114 mg/dL — ABNORMAL HIGH (ref 70–105)

## 2019-01-07 LAB — CBC WITH DIFF
BASOPHIL #: <0.1 x10ˆ3/uL (ref <=0.20)
BASOPHIL %: 0 %
EOSINOPHIL #: <0.1 10*3/uL (ref <=0.50)
EOSINOPHIL #: <0.1 x10ˆ3/uL (ref <=0.50)
EOSINOPHIL %: 1 %
HCT: 28.1 % — ABNORMAL LOW (ref 34.8–46.0)
HGB: 9.4 g/dL — ABNORMAL LOW (ref 11.5–16.0)
IMMATURE GRANULOCYTE #: <0.1 10*3/uL (ref <0.10)
IMMATURE GRANULOCYTE %: 0 % (ref 0–1)
IMMATURE GRANULOCYTE %: 0 % (ref 0–1)
LYMPHOCYTE #: 1.5 10*3/uL (ref 1.00–4.80)
LYMPHOCYTE %: 20 %
MCH: 30.3 pg (ref 26.0–32.0)
MCHC: 33.5 g/dL (ref 31.0–35.5)
MCV: 90.6 fL (ref 78.0–100.0)
MONOCYTE #: 0.62 10*3/uL (ref 0.20–1.10)
MONOCYTE %: 8 %
MPV: 8.9 fL (ref 8.7–12.5)
NEUTROPHIL #: 5.23 10*3/uL (ref 1.50–7.70)
NEUTROPHIL %: 71 %
PLATELETS: 142 x10ˆ3/uL — ABNORMAL LOW (ref 150–400)
RBC: 3.1 x10ˆ6/uL — ABNORMAL LOW (ref 3.85–5.22)
RDW-CV: 11.9 % (ref 11.5–15.5)
WBC: 7.4 10*3/uL (ref 3.7–11.0)

## 2019-01-07 LAB — BUN: BUN: 13 mg/dL (ref 8–25)

## 2019-01-07 LAB — ELECTROLYTES
ANION GAP: 7 mmol/L (ref 4–13)
CHLORIDE: 99 mmol/L (ref 96–111)
CO2 TOTAL: 25 mmol/L (ref 22–32)
POTASSIUM: 4.3 mmol/L (ref 3.5–5.1)
SODIUM: 131 mmol/L — ABNORMAL LOW (ref 136–145)

## 2019-01-07 LAB — PT/INR
INR: 2.14 — ABNORMAL HIGH (ref 0.80–1.20)
PROTHROMBIN TIME: 25.2 s — ABNORMAL HIGH (ref 9.1–13.9)

## 2019-01-07 LAB — CREATININE WITH EGFR
CREATININE: 0.58 mg/dL (ref 0.49–1.10)
ESTIMATED GFR: >60 mL/min/1.73mˆ2 (ref >60)

## 2019-01-07 LAB — MAGNESIUM: MAGNESIUM: 1.9 mg/dL (ref 1.6–2.6)

## 2019-01-07 MED ORDER — CALCIUM 200 MG (AS CALCIUM CARBONATE 500 MG) CHEWABLE TABLET
500.0000 mg | CHEWABLE_TABLET | Freq: Every day | ORAL | Status: DC
Start: 2019-01-07 — End: 2019-01-10
  Administered 2019-01-07 – 2019-01-10 (×4): 500 mg via ORAL
  Filled 2019-01-07 (×5): qty 1

## 2019-01-07 MED ORDER — WARFARIN 2 MG TABLET
2.0000 mg | ORAL_TABLET | Freq: Once | ORAL | Status: AC
Start: 2019-01-07 — End: 2019-01-07
  Administered 2019-01-07: 2 mg via ORAL
  Filled 2019-01-07 (×2): qty 1

## 2019-01-07 MED ORDER — PROCHLORPERAZINE EDISYLATE 10 MG/2 ML (5 MG/ML) INJECTION SOLUTION
2.5000 mg | Freq: Once | INTRAMUSCULAR | Status: AC
Start: 2019-01-07 — End: 2019-01-07
  Administered 2019-01-07: 2.5 mg via INTRAVENOUS
  Filled 2019-01-07: qty 2

## 2019-01-07 MED ORDER — POTASSIUM CHLORIDE ER 20 MEQ TABLET,EXTENDED RELEASE(PART/CRYST)
20.0000 meq | ORAL_TABLET | Freq: Two times a day (BID) | ORAL | Status: DC
Start: 2019-01-07 — End: 2019-01-10
  Administered 2019-01-07 – 2019-01-08 (×2): 20 meq via ORAL
  Administered 2019-01-08: 0 meq via ORAL
  Administered 2019-01-09 – 2019-01-10 (×3): 20 meq via ORAL
  Filled 2019-01-07 (×6): qty 1
  Filled 2019-01-07: qty 2

## 2019-01-07 MED ORDER — FUROSEMIDE 40 MG TABLET
40.0000 mg | ORAL_TABLET | Freq: Every day | ORAL | Status: DC
Start: 2019-01-07 — End: 2019-01-10
  Administered 2019-01-07 – 2019-01-10 (×4): 40 mg via ORAL
  Filled 2019-01-07 (×4): qty 1

## 2019-01-07 MED ORDER — ONDANSETRON HCL (PF) 4 MG/2 ML INJECTION SOLUTION
4.0000 mg | Freq: Once | INTRAMUSCULAR | Status: AC
Start: 2019-01-07 — End: 2019-01-07
  Administered 2019-01-07 (×2): 4 mg via INTRAVENOUS
  Filled 2019-01-07: qty 2

## 2019-01-07 MED ORDER — ONDANSETRON HCL (PF) 4 MG/2 ML INJECTION SOLUTION
4.00 mg | Freq: Three times a day (TID) | INTRAMUSCULAR | Status: DC | PRN
Start: 2019-01-07 — End: 2019-01-10
  Administered 2019-01-07 – 2019-01-08 (×2): 4 mg via INTRAVENOUS
  Filled 2019-01-07 (×2): qty 2

## 2019-01-07 MED ADMIN — warfarin 2 mg tablet: ORAL | @ 23:00:00

## 2019-01-07 MED ADMIN — ketorolac 30 mg/mL (1 mL) injection solution: INTRAVENOUS | @ 08:00:00

## 2019-01-07 MED ADMIN — sodium chloride 0.9 % (flush) injection syringe: @ 14:00:00

## 2019-01-07 MED ADMIN — ketorolac 30 mg/mL (1 mL) injection solution: INTRAVENOUS | @ 02:00:00

## 2019-01-07 MED ADMIN — prochlorperazine Edisylate 10 mg/2 mL (5 mg/mL) injection solution: INTRAVENOUS | @ 22:00:00

## 2019-01-07 MED ADMIN — ondansetron HCl (PF) 4 mg/2 mL injection solution: INTRAVENOUS | @ 14:00:00

## 2019-01-07 MED ADMIN — sodium chloride 0.9 % (flush) injection syringe: TRANSDERMAL | @ 20:00:00

## 2019-01-07 MED ADMIN — sodium chloride 0.9 % (flush) injection syringe: INTRAVENOUS | @ 05:00:00

## 2019-01-07 MED ADMIN — morphine 2 mg/mL intravenous syringe: ORAL | @ 08:00:00

## 2019-01-07 MED ADMIN — lactated Ringers intravenous solution: ORAL | @ 23:00:00

## 2019-01-07 MED ADMIN — calcitrioL 0.25 mcg capsule: @ 23:00:00

## 2019-01-07 MED ADMIN — sodium chloride 0.9 % intravenous solution: ORAL | @ 19:00:00

## 2019-01-07 MED ADMIN — LORazepam 2 mg/mL injection solution: ORAL | @ 08:00:00

## 2019-01-07 NOTE — Care Plan (Signed)
Patient denied chest pain or SOB CT removed today and pain/discomfort subsided.  Pacer wires capped and external pacer continued. Fall and skin precautions maintained. Frequent repositioning encouraged. Sitter select active and audible. Ambulated in room today with assist of 1; up in chair for majority of the day. Instructed to call for assistance when ambulating. Call bell within reach. VSS. Plan to d/c tomorrow or Monday.  Pt walked in hallway and room.  Pt c/o abdominal discomfort- BM twice today, Zofran and TUMS added.  Emotional support provided to patient. Patient prefers to be called [name].    Problem: Adult Inpatient Plan of Care  Goal: Plan of Care Review  Outcome: Ongoing (see interventions/notes)  Goal: Patient-Specific Goal (Individualized)  Outcome: Ongoing (see interventions/notes)  Goal: Absence of Hospital-Acquired Illness or Injury  Outcome: Ongoing (see interventions/notes)  Goal: Optimal Comfort and Wellbeing  Outcome: Ongoing (see interventions/notes)  Goal: Rounds/Family Conference  Outcome: Ongoing (see interventions/notes)     Problem: Fall Injury Risk  Goal: Absence of Fall and Fall-Related Injury  Outcome: Ongoing (see interventions/notes)     Problem: Skin Injury Risk Increased  Goal: Skin Health and Integrity  Outcome: Ongoing (see interventions/notes)

## 2019-01-07 NOTE — Ancillary Notes (Signed)
Visited patient for ambulation, did not ambulate. Patient refused due to wanting to bathe before ambulating. Educated patient on importance of ambulation. Patient voices understanding of importance and continues to refuse. Patient reminded that they may contact and ambulate with nursing/ CA staff. Cardiac Rehabilitation to follow up further expected ambulation's. Patient with call bell within reach.    **See Cardiac Exercise Flowsheet/tab for further details**    [place]

## 2019-01-07 NOTE — Progress Notes (Signed)
[facility]  Cardiac Surgery   Floor/Stepdown Progress Note      Kalan, Rinn  Date of Admission:  01/04/2019  Date of Birth:  1956/02/19    Hospital Day:  LOS: 3 days   Post-op Day:  3 Days Post-Op, S/P  AVr  Date of Service:  01/07/2019    SUBJECTIVE: Patient OOB in chair.  She reports being nauseated but states that her bowels moved this morning. She also reports that her right arm is swollen.     OBJECTIVE:    Vital Signs:  Temp (24hrs) Max:37.1 C (98.7 F)      Temperature: 37.1 C (98.7 F) (01/07/19 0511)  BP (Non-Invasive): (!) 119/91 (01/07/19 0823)  MAP (Non-Invasive): 96 mmHG (01/07/19 0823)  Heart Rate: 95 (01/07/19 0823)  Respiratory Rate: 16 (01/07/19 0511)  Pain Score (Numeric, Faces): 6 (01/07/19 0130)  SpO2: 95 % (01/07/19 0823)    Base (Admission) Weight:  Weight: 95.1 kg (209 lb 10.5 oz)  Weight:  Weight: 97 kg (213 lb 13.5 oz)    Current Inpatient Medications:  acetaminophen (TYLENOL) tablet, 650 mg, Oral, Q4H PRN  ascorbic acid (VITAMIN C) tablet, 500 mg, Oral, 2x/day  aspirin chewable tablet 81 mg, 81 mg, Oral, Daily  bisacodyl (DULCOLAX) rectal suppository, 10 mg, Rectal, Daily PRN  docusate sodium (COLACE) capsule, 100 mg, Oral, 2x/day  furosemide (LASIX) tablet, 40 mg, Oral, Daily  ipratropium (ATROVENT) 0.02% nebulizer solution, 0.5 mg, Nebulization, Q4H PRN  levalbuterol (XOPENEX) 1.25 mg/ 0.5 mL nebulizer solution, 1.25 mg, Nebulization, Q4H PRN  lidocaine-menthol (LIDOPATCH) 3.6%-1.25% patch, 1 Patch, Transdermal, Daily  magnesium hydroxide (MILK OF MAGNESIA) 400mg  per 65mL oral liquid, 30 mL, Oral, Daily PRN  metoprolol tartrate (LOPRESSOR) tablet, 12.5 mg, Oral, 2x/day  mupirocin (BACTROBAN) 2% topical ointment, , Apply Topically, 2x/day  NS flush syringe, 2 mL, Intracatheter, Q8HRS    And  NS flush syringe, 2-6 mL, Intracatheter, Q1 MIN PRN  nystatin (NYSTOP) 100,000 units/g topical powder, , Apply Topically, 2x/day  polyethylene glycol (MIRALAX) oral packet, 17 g,  Oral, Daily  SSIP insulin lispro (HUMALOG) 100 units/mL injection, 0-12 Units, Subcutaneous, 4x/day PRN  warfarin (COUMADIN) tablet, 2 mg, Oral, Once 2100        Appropriate Home Meds restarted:  Yes    I/O:  I/O last 24 hours to current time:      Intake/Output Summary (Last 24 hours) at 01/07/2019 1001  Last data filed at 01/07/2019 0548  Gross per 24 hour   Intake 250 ml   Output 455 ml   Net -205 ml     I/O last 3 completed shifts:  In: 921 [P.O.:700; I.V.:35]  Out: 780 [Urine:600; Chest Tube:180]    Nutrition/Diet:  MNT PROTOCOL FOR DIETITIAN  DIET CARDIAC (4G NA,LOWFAT,LOWCHOL) Restrict fluids to: 1800 ML    Hardware (foley):   Date Placed Necessity Reviewed  Date Discontinued    Foley (out day number two)                      Labs:  Reviewed:  I have reviewed all lab results.  Ordered:  CBC, BMP, PT/INR  Anticoagulation:  yes  Target INR:  2 - 3   Follow Up Arranged:  Yes    Radiology:    Reviewed:  CXR:  Direct visualization of the image on 01/07/2019 on [place] showed  small bilateral pleural effusions. no pneumothorax is demonstrated   Ordered:  Chest x-ray for tomorrow    Physical  Exam:    Constitutional:  no distress and vital signs reviewed and discussed with patient  Eyes:  Pupils equal and round, reactive to light and accomodation.   Neck:  no thyromegaly or lymphadenopathy and supple, symmetrical, trachea midline  Respiratory:  Clear to auscultation bilaterally.   Cardiovascular:  regular rate and rhythm  Gastrointestinal:  Soft, non-tender, Bowel sounds normal, non-distended  Musculoskeletal:  Head atraumatic and normocephalic  Integumentary:  Skin warm and dry  Neurologic:  Alert and oriented x3  Psychiatric:  Normal affect, behavior, memory, thought content, judgement, and speech.    ASSESSMENT/PLAN:    S/P Aortic Valve Replacement     - continue ASA, BB   - start Lasix 40 PO, daily    - start K-Dur 20 PO, BID   - Chest Tube DC'd today   - [place] of Rt. Upper extremity to R/O DVT   - Coumadin 2 mg          Problem List:  Active Hospital Problems    Diagnosis   . Aortic stenosis       PT/OT: Yes    DVT RISK FACTORS HAVE BEN ASSESSED AND PROPHYLAXIS ORDERED (SEE RUBYONLINE - REFERENCE TOOLS - MD, DVT PROPHY OR POCKET CARD):  YES    Disposition Planning: Home discharge  and [place], APRN,FNP-BC 01/07/2019 10:01

## 2019-01-08 ENCOUNTER — Inpatient Hospital Stay (HOSPITAL_COMMUNITY): Payer: 59

## 2019-01-08 DIAGNOSIS — R143 Flatulence: Secondary | ICD-10-CM

## 2019-01-08 DIAGNOSIS — K567 Ileus, unspecified: Secondary | ICD-10-CM

## 2019-01-08 LAB — CBC
HCT: 28.4 % — ABNORMAL LOW (ref 34.8–46.0)
HGB: 9.5 g/dL — ABNORMAL LOW (ref 11.5–16.0)
MCH: 30.5 pg (ref 26.0–32.0)
MCHC: 33.5 g/dL (ref 31.0–35.5)
MCV: 91.3 fL (ref 78.0–100.0)
MPV: 9.1 fL (ref 8.7–12.5)
PLATELETS: 197 x10ˆ3/uL (ref 150–400)
RBC: 3.11 x10ˆ6/uL — ABNORMAL LOW (ref 3.85–5.22)
RDW-CV: 11.8 % (ref 11.5–15.5)
WBC: 5.9 x10ˆ3/uL (ref 3.7–11.0)

## 2019-01-08 LAB — CBC WITH DIFF
BASOPHIL #: <0.1 x10ˆ3/uL (ref <=0.20)
BASOPHIL %: 0 %
EOSINOPHIL #: <0.1 x10ˆ3/uL (ref <=0.50)
EOSINOPHIL %: 1 %
HCT: 28.1 % — ABNORMAL LOW (ref 34.8–46.0)
HGB: 9.5 g/dL — ABNORMAL LOW (ref 11.5–16.0)
IMMATURE GRANULOCYTE #: <0.1 10*3/uL (ref <0.10)
IMMATURE GRANULOCYTE %: 0 % (ref 0–1)
LYMPHOCYTE #: 1.15 10*3/uL (ref 1.00–4.80)
LYMPHOCYTE %: 17 %
MCH: 30.6 pg (ref 26.0–32.0)
MCHC: 33.8 g/dL (ref 31.0–35.5)
MCV: 90.6 fL (ref 78.0–100.0)
MONOCYTE #: 0.53 10*3/uL (ref 0.20–1.10)
MONOCYTE %: 8 %
MPV: 8.8 fL (ref 8.7–12.5)
NEUTROPHIL #: 5.18 x10ˆ3/uL (ref 1.50–7.70)
NEUTROPHIL %: 74 %
PLATELETS: 186 10*3/uL (ref 150–400)
RBC: 3.1 x10ˆ6/uL — ABNORMAL LOW (ref 3.85–5.22)
RDW-CV: 11.9 % (ref 11.5–15.5)
WBC: 6.9 10*3/uL (ref 3.7–11.0)

## 2019-01-08 LAB — LIPASE: LIPASE: 12 U/L (ref 10–80)

## 2019-01-08 LAB — ECG 12-LEAD
Atrial Rate: 92 {beats}/min
Calculated P Axis: 38 degrees
Calculated R Axis: 22 degrees
Calculated T Axis: 89 degrees
PR Interval: 136 ms
QRS Duration: 82 ms
QT Interval: 378 ms
QTC Calculation: 467 ms
Ventricular rate: 92 {beats}/min

## 2019-01-08 LAB — LACTIC ACID LEVEL: LACTIC ACID: 0.6 mmol/L (ref 0.5–2.2)

## 2019-01-08 LAB — AST (SGOT): AST (SGOT): 15 U/L (ref 8–41)

## 2019-01-08 LAB — ELECTROLYTES
ANION GAP: 10 mmol/L (ref 4–13)
CHLORIDE: 100 mmol/L (ref 96–111)
CO2 TOTAL: 24 mmol/L (ref 22–32)
POTASSIUM: 4.1 mmol/L (ref 3.5–5.1)
SODIUM: 134 mmol/L — ABNORMAL LOW (ref 136–145)

## 2019-01-08 LAB — HEPATIC FUNCTION PANEL
ALBUMIN: 3.2 g/dL — ABNORMAL LOW (ref 3.4–4.8)
ALKALINE PHOSPHATASE: 47 U/L — ABNORMAL LOW (ref 50–130)
ALT (SGPT): 9 U/L (ref <55)
AST (SGOT): 15 U/L (ref 8–41)
BILIRUBIN DIRECT: 0.3 mg/dL — ABNORMAL HIGH (ref <0.3)
BILIRUBIN TOTAL: 0.5 mg/dL (ref 0.3–1.3)
PROTEIN TOTAL: 6 g/dL (ref 6.0–8.0)

## 2019-01-08 LAB — POC BLOOD GLUCOSE (RESULTS)
GLUCOSE, POC: 104 mg/dL (ref 70–105)
GLUCOSE, POC: 108 mg/dL — ABNORMAL HIGH (ref 70–105)
GLUCOSE, POC: 105 mg/dL (ref 70–105)
GLUCOSE, POC: 107 mg/dL — ABNORMAL HIGH (ref 70–105)

## 2019-01-08 LAB — ALT (SGPT): ALT (SGPT): 7 U/L (ref <55)

## 2019-01-08 LAB — PTT (PARTIAL THROMBOPLASTIN TIME)
APTT: 31.7 s (ref 24.2–37.5)
APTT: 40.2 s — ABNORMAL HIGH (ref 24.2–37.5)

## 2019-01-08 LAB — MAGNESIUM: MAGNESIUM: 1.8 mg/dL (ref 1.6–2.6)

## 2019-01-08 LAB — PT/INR
INR: 1.6 — ABNORMAL HIGH (ref 0.80–1.20)
PROTHROMBIN TIME: 18.7 s — ABNORMAL HIGH (ref 9.1–13.9)

## 2019-01-08 LAB — BUN: BUN: 10 mg/dL (ref 8–25)

## 2019-01-08 LAB — CREATININE WITH EGFR
CREATININE: 0.59 mg/dL (ref 0.49–1.10)
ESTIMATED GFR: >60 mL/min/1.73mˆ2 (ref >60)

## 2019-01-08 LAB — AMYLASE: AMYLASE: 26 U/L (ref 25–125)

## 2019-01-08 MED ORDER — METOPROLOL TARTRATE 25 MG TABLET
25.00 mg | ORAL_TABLET | Freq: Two times a day (BID) | ORAL | Status: DC
Start: 2019-01-08 — End: 2019-01-09
  Administered 2019-01-08 – 2019-01-09 (×2): 25 mg via ORAL
  Filled 2019-01-08 (×2): qty 1

## 2019-01-08 MED ORDER — WARFARIN 2 MG TABLET
4.0000 mg | ORAL_TABLET | Freq: Once | ORAL | Status: AC
Start: 2019-01-08 — End: 2019-01-08
  Administered 2019-01-08: 4 mg via ORAL
  Filled 2019-01-08: qty 2

## 2019-01-08 MED ORDER — DEXTROSE 5 % IN WATER (D5W) INTRAVENOUS SOLUTION
150.0000 mg | Freq: Once | INTRAVENOUS | Status: AC
Start: 2019-01-08 — End: 2019-01-08
  Administered 2019-01-08: 150 mg via INTRAVENOUS
  Administered 2019-01-08: 0 mg via INTRAVENOUS
  Filled 2019-01-08: qty 3

## 2019-01-08 MED ORDER — PANTOPRAZOLE 40 MG TABLET,DELAYED RELEASE
40.0000 mg | DELAYED_RELEASE_TABLET | Freq: Every morning | ORAL | Status: DC
Start: 2019-01-08 — End: 2019-01-10
  Administered 2019-01-08: 40 mg via ORAL
  Administered 2019-01-09: 0 mg via ORAL
  Administered 2019-01-10: 40 mg via ORAL
  Filled 2019-01-08 (×4): qty 1

## 2019-01-08 MED ORDER — AMIODARONE 50 MG/ML INTRAVENOUS SOLUTION
1.0000 mg/min | INTRAVENOUS | Status: DC
Start: 2019-01-08 — End: 2019-01-09
  Administered 2019-01-08 (×2): 0.5 mg/min via INTRAVENOUS
  Administered 2019-01-08: 1 mg/min via INTRAVENOUS
  Administered 2019-01-08 – 2019-01-09 (×2): 0.5 mg/min via INTRAVENOUS
  Administered 2019-01-09: 0 mg/min via INTRAVENOUS
  Filled 2019-01-08: qty 18

## 2019-01-08 MED ORDER — HEPARIN (PORCINE) 25,000 UNIT/250 ML (100 UNIT/ML) IN DEXTROSE 5 % IV
12.0000 [IU]/kg/h | INTRAVENOUS | Status: DC
Start: 2019-01-08 — End: 2019-01-09
  Administered 2019-01-08: 14 [IU]/kg/h via INTRAVENOUS
  Administered 2019-01-08: 12 [IU]/kg/h via INTRAVENOUS
  Administered 2019-01-08 – 2019-01-09 (×3): 14 [IU]/kg/h via INTRAVENOUS
  Filled 2019-01-08 (×2): qty 250

## 2019-01-08 MED ORDER — HYDROCHLOROTHIAZIDE 25 MG TABLET
25.0000 mg | ORAL_TABLET | Freq: Every day | ORAL | Status: DC
Start: 2019-01-08 — End: 2019-01-10
  Administered 2019-01-08 – 2019-01-10 (×3): 25 mg via ORAL
  Filled 2019-01-08 (×3): qty 1

## 2019-01-08 MED ADMIN — ascorbic acid (vitamin C) 500 mg tablet: ORAL | @ 21:00:00

## 2019-01-08 MED ADMIN — aspirin 81 mg chewable tablet: ORAL | @ 09:00:00

## 2019-01-08 MED ADMIN — sodium chloride 0.9 % (flush) injection syringe: @ 13:00:00

## 2019-01-08 MED ADMIN — heparin (porcine) 25,000 unit/250 mL (100 unit/mL) in dextrose 5 % IV: INTRAVENOUS | @ 19:00:00

## 2019-01-08 MED ADMIN — nystatin 100,000 unit/gram topical powder: TOPICAL | @ 21:00:00

## 2019-01-08 MED ADMIN — oxyCODONE-acetaminophen 5 mg-325 mg tablet: ORAL | @ 21:00:00

## 2019-01-08 MED ADMIN — tiotropium device: TOPICAL | @ 09:00:00

## 2019-01-08 MED ADMIN — sodium chloride 0.9 % intravenous solution: ORAL | @ 21:00:00

## 2019-01-08 MED ADMIN — acetaminophen 1,000 mg/100 mL (10 mg/mL) intravenous solution: ORAL | @ 10:00:00

## 2019-01-08 MED ADMIN — sodium chloride 0.9 % (flush) injection syringe: INTRAVENOUS | @ 04:00:00

## 2019-01-08 MED ADMIN — sodium chloride 0.9 % (flush) injection syringe: ORAL | @ 08:00:00

## 2019-01-08 MED ADMIN — ALBUTEROL 5 MG/ IPRATROPIUM 0.5 MG INHALATION: INTRAVENOUS | @ 19:00:00

## 2019-01-08 MED ADMIN — heparin lock flush (porcine) 10 unit/mL intravenous solution: ORAL | @ 09:00:00

## 2019-01-08 MED ADMIN — ondansetron 4 mg disintegrating tablet: @ 06:00:00

## 2019-01-08 NOTE — Care Plan (Addendum)
Patient continues to complain of intermitted nausea and vomiting and HA. managed with PRN Zofran and one time Compazine, XR of abdomen Supine ordered. Ct sites dressing changed,Border to Mid-sternal  intact, up  with 1 assist, sitter select in place call light within reach

## 2019-01-08 NOTE — Ancillary Notes (Signed)
Provided additional education to patient regarding Phase II Cardiac Rehabilitation.  Patient currently resting in bed with call bell in reach and has no further questions at this time.  Patient reports some nausea at this time, may walk later this morning.  Will continue to monitor for changes.      *See Cardiac Exercise Flowsheet for further details*    [facility] Phase I Cardiac Rehab  Phone# [phone]

## 2019-01-08 NOTE — Nurses Notes (Addendum)
[name] from  at bedside to check on the patient, Blood work and STAT EKG  ordered.

## 2019-01-08 NOTE — Care Plan (Signed)
Patient complaining of intermitted Nausea Cramping, Zofran given, she is tearing down, when the Labs were drawing blood she went screaming and crying saying that it hurts. [place]  [place]  From CVICU notified.

## 2019-01-08 NOTE — Progress Notes (Signed)
[facility]  Cardiac Surgery   Floor/Stepdown Progress Note      [name], [name]  Date of Admission:  01/04/2019  Date of Birth:  1956-07-26    Hospital Day:  LOS: 4 days   Post-op Day:  4 Days Post-Op, S/P  AVr  Date of Service:  01/08/2019    SUBJECTIVE: Patient resting in bed.  She reports nausea and vomiting last night. She states that she has had nausea and vomiting after anesthesia received for previous surgeries. Patient is in and out of a-fib.    OBJECTIVE:    Vital Signs:  Temp (24hrs) Max:36.7 C (98.1 F)      Temperature: 36.7 C (98 F) (01/08/19 0409)  BP (Non-Invasive): (!) 158/88 (01/08/19 0409)  MAP (Non-Invasive): 108 mmHG (01/08/19 0409)  Heart Rate: 91 (01/08/19 0409)  Respiratory Rate: 18 (01/08/19 0409)  Pain Score (Numeric, Faces): 2 (01/08/19 0900)  SpO2: 98 % (01/08/19 0122)    Base (Admission) Weight:  Weight: 95.1 kg (209 lb 10.5 oz)  Weight:  Weight: 95 kg (209 lb 7 oz)    Current Inpatient Medications:  acetaminophen (TYLENOL) tablet, 650 mg, Oral, Q4H PRN  amiodarone (CORDARONE) 900 mg in D5W 500 mL infusion, 1 mg/min, Intravenous, Continuous  ascorbic acid (VITAMIN C) tablet, 500 mg, Oral, 2x/day  aspirin chewable tablet 81 mg, 81 mg, Oral, Daily  bisacodyl (DULCOLAX) rectal suppository, 10 mg, Rectal, Daily PRN  calcium carbonate (TUMS) chewable tablet, 500 mg, Oral, Daily  docusate sodium (COLACE) capsule, 100 mg, Oral, 2x/day  furosemide (LASIX) tablet, 40 mg, Oral, Daily  heparin 25,000 units in D5W 250 mL infusion, 12 Units/kg/hr (Adjusted), Intravenous, Continuous  hydroCHLOROthiazide (HYDRODIURIL) tablet, 25 mg, Oral, Daily  ipratropium (ATROVENT) 0.02% nebulizer solution, 0.5 mg, Nebulization, Q4H PRN  levalbuterol (XOPENEX) 1.25 mg/ 0.5 mL nebulizer solution, 1.25 mg, Nebulization, Q4H PRN  lidocaine-menthol (LIDOPATCH) 3.6%-1.25% patch, 1 Patch, Transdermal, Daily  magnesium hydroxide (MILK OF MAGNESIA) 400mg  per 8mL oral liquid, 30 mL, Oral, Daily  PRN  metoprolol tartrate (LOPRESSOR) tablet, 25 mg, Oral, 2x/day  mupirocin (BACTROBAN) 2% topical ointment, , Apply Topically, 2x/day  NS flush syringe, 2 mL, Intracatheter, Q8HRS    And  NS flush syringe, 2-6 mL, Intracatheter, Q1 MIN PRN  nystatin (NYSTOP) 100,000 units/g topical powder, , Apply Topically, 2x/day  ondansetron (ZOFRAN) 2 mg/mL injection, 4 mg, Intravenous, Q8H PRN  pantoprazole (PROTONIX) delayed release tablet, 40 mg, Oral, Daily before Breakfast  polyethylene glycol (MIRALAX) oral packet, 17 g, Oral, Daily  potassium chloride (K-DUR) extended release tablet, 20 mEq, Oral, 2x/day-Food  SSIP insulin lispro (HUMALOG) 100 units/mL injection, 0-12 Units, Subcutaneous, 4x/day PRN  warfarin (COUMADIN) tablet, 4 mg, Oral, Once 2100        Appropriate Home Meds restarted:  Yes    I/O:  I/O last 24 hours to current time:      Intake/Output Summary (Last 24 hours) at 01/08/2019 1041  Last data filed at 01/08/2019 1027  Gross per 24 hour   Intake 540 ml   Output 825 ml   Net -285 ml     I/O last 3 completed shifts:  In: 750 [P.O.:730; I.V.:20]  Out: 825 [Urine:825]    Nutrition/Diet:  MNT PROTOCOL FOR DIETITIAN  DIET CARDIAC (4G NA,LOWFAT,LOWCHOL) Restrict fluids to: 1800 ML  DIET NPO - SPECIFIC DATE & TIME    Hardware (foley):   Date Placed Necessity Reviewed  Date Discontinued    Foley (out day number two)  Labs:  Reviewed:  I have reviewed all lab results.  Ordered:  CBC, BMP, PT/INR  Anticoagulation:  yes  Target INR:  2 - 3   Follow Up Arranged:  Yes    Radiology:    Reviewed:  CXR:  Direct visualization of the image on 01/08/2019 on [place] PACS showed  small bilateral pleural effusions. no pneumothorax is demonstrated   Ordered:  Chest x-ray for tomorrow    Physical Exam:    Constitutional:  no distress and vital signs reviewed and discussed with patient  Eyes:  Pupils equal and round, reactive to light and accomodation.   Neck:  no thyromegaly or lymphadenopathy and supple, symmetrical,  trachea midline  Respiratory:  Clear to auscultation bilaterally.   Cardiovascular:  regular rate and rhythm  Gastrointestinal:  Soft, non-tender, Bowel sounds normal, non-distended  Musculoskeletal:  Head atraumatic and normocephalic  Integumentary:  Skin warm and dry  Neurologic:  Alert and oriented x3  Psychiatric:  Normal affect, behavior, memory, thought content, judgement, and speech.    ASSESSMENT/PLAN:    S/P Aortic Valve Replacement     - continue ASA   - increase BB to 25 mg, BID   - start HCTZ 25 mg, QD   - continue Lasix 40 PO, daily   - K-Dur 20 PO, BID   - start amiodarone bolus and drip   - start heparin gtt   - [place] of Rt. Upper extremity to R/O DVT   - Coumadin 4 mg    - NPO after midnight for possible cardioversion    Nausea/Vomiting      - continue Zofran, prn   - amylase and lipase labs ordered   - start Protonix 40, PO, QD    Problem List:  Active Hospital Problems    Diagnosis   . Aortic stenosis       PT/OT: Yes    DVT RISK FACTORS HAVE BEN ASSESSED AND PROPHYLAXIS ORDERED (SEE RUBYONLINE - REFERENCE TOOLS - MD, DVT PROPHY OR POCKET CARD):  YES    Disposition Planning: Home discharge  and [place], APRN,FNP-BC 01/08/2019 10:41

## 2019-01-08 NOTE — Ancillary Notes (Signed)
Visited patient for ambulation, did not ambulate. Patient refused due to nausea. Educated patient on importance of ambulation. Patient voices understanding of importance and continues to refuse. Patient reminded that they may contact and ambulate with nursing/ CA staff. Cardiac Rehabilitation to follow up further expected ambulation's. Patient in with call bell within reach.    **See Cardiac Exercise Flowsheet/tab for further details**    [place]

## 2019-01-09 ENCOUNTER — Other Ambulatory Visit: Payer: Self-pay

## 2019-01-09 ENCOUNTER — Inpatient Hospital Stay (HOSPITAL_COMMUNITY): Payer: 59

## 2019-01-09 DIAGNOSIS — Z9889 Other specified postprocedural states: Secondary | ICD-10-CM

## 2019-01-09 DIAGNOSIS — I10 Essential (primary) hypertension: Secondary | ICD-10-CM

## 2019-01-09 DIAGNOSIS — J9811 Atelectasis: Secondary | ICD-10-CM

## 2019-01-09 DIAGNOSIS — J90 Pleural effusion, not elsewhere classified: Secondary | ICD-10-CM

## 2019-01-09 LAB — MAGNESIUM: MAGNESIUM: 1.9 mg/dL (ref 1.6–2.6)

## 2019-01-09 LAB — POC BLOOD GLUCOSE (RESULTS)
GLUCOSE, POC: 105 mg/dL (ref 70–105)
GLUCOSE, POC: 173 mg/dL — ABNORMAL HIGH (ref 70–105)
GLUCOSE, POC: 110 mg/dL — ABNORMAL HIGH (ref 70–105)
GLUCOSE, POC: 122 mg/dL — ABNORMAL HIGH (ref 70–105)

## 2019-01-09 LAB — CBC
HCT: 27 % — ABNORMAL LOW (ref 34.8–46.0)
HGB: 9.2 g/dL — ABNORMAL LOW (ref 11.5–16.0)
MCH: 30.4 pg (ref 26.0–32.0)
MCHC: 34.1 g/dL (ref 31.0–35.5)
MCV: 89.1 fL (ref 78.0–100.0)
MPV: 8.4 fL — ABNORMAL LOW (ref 8.7–12.5)
PLATELETS: 221 x10ˆ3/uL (ref 150–400)
RBC: 3.03 x10ˆ6/uL — ABNORMAL LOW (ref 3.85–5.22)
RDW-CV: 12 % (ref 11.5–15.5)
WBC: 6 x10ˆ3/uL (ref 3.7–11.0)

## 2019-01-09 LAB — BASIC METABOLIC PANEL
ANION GAP: 8 mmol/L (ref 4–13)
BUN/CREA RATIO: 17 (ref 6–22)
BUN: 11 mg/dL (ref 8–25)
CALCIUM: 8.5 mg/dL (ref 8.5–10.2)
CHLORIDE: 97 mmol/L (ref 96–111)
CO2 TOTAL: 26 mmol/L (ref 22–32)
CO2 TOTAL: 26 mmol/L (ref 22–32)
CREATININE: 0.66 mg/dL (ref 0.49–1.10)
ESTIMATED GFR: >60 mL/min/1.73mˆ2 (ref >60)
GLUCOSE: 92 mg/dL (ref 65–139)
POTASSIUM: 3.8 mmol/L (ref 3.5–5.1)
SODIUM: 131 mmol/L — ABNORMAL LOW (ref 136–145)

## 2019-01-09 LAB — PT/INR
INR: 2.08 — ABNORMAL HIGH (ref 0.80–1.20)
PROTHROMBIN TIME: 24.5 s — ABNORMAL HIGH (ref 9.1–13.9)

## 2019-01-09 LAB — PTT (PARTIAL THROMBOPLASTIN TIME)
APTT: 52.4 s — ABNORMAL HIGH (ref 24.2–37.5)
APTT: 56 s — ABNORMAL HIGH (ref 24.2–37.5)
APTT: 56.3 s — ABNORMAL HIGH (ref 24.2–37.5)

## 2019-01-09 MED ORDER — WARFARIN 2 MG TABLET
2.00 mg | ORAL_TABLET | Freq: Once | ORAL | Status: AC
Start: 2019-01-09 — End: 2019-01-09
  Administered 2019-01-09: 2 mg via ORAL
  Filled 2019-01-09: qty 1

## 2019-01-09 MED ORDER — CALCIUM 200 MG (AS CALCIUM CARBONATE 500 MG) CHEWABLE TABLET
500.0000 mg | CHEWABLE_TABLET | Freq: Once | ORAL | Status: AC
Start: 2019-01-09 — End: 2019-01-09
  Administered 2019-01-09: 500 mg via ORAL
  Filled 2019-01-09: qty 1

## 2019-01-09 MED ORDER — AMIODARONE 200 MG TABLET
400.0000 mg | ORAL_TABLET | Freq: Two times a day (BID) | ORAL | Status: DC
Start: 2019-01-09 — End: 2019-01-10
  Administered 2019-01-09 – 2019-01-10 (×3): 400 mg via ORAL
  Filled 2019-01-09 (×3): qty 2

## 2019-01-09 MED ORDER — AMIODARONE 200 MG TABLET
200.0000 mg | ORAL_TABLET | Freq: Every day | ORAL | Status: DC
Start: 2019-01-23 — End: 2019-01-10

## 2019-01-09 MED ORDER — METOPROLOL TARTRATE 12.5 MG HALF TAB
37.50 mg | ORAL_TABLET | Freq: Two times a day (BID) | ORAL | Status: DC
Start: 2019-01-09 — End: 2019-01-10
  Administered 2019-01-09: 37.5 mg via ORAL
  Filled 2019-01-09: qty 1

## 2019-01-09 MED ORDER — AMIODARONE 200 MG TABLET
400.0000 mg | ORAL_TABLET | Freq: Every day | ORAL | Status: DC
Start: 2019-01-16 — End: 2019-01-10

## 2019-01-09 MED ORDER — POTASSIUM CHLORIDE ER 20 MEQ TABLET,EXTENDED RELEASE(PART/CRYST)
20.0000 meq | ORAL_TABLET | ORAL | Status: AC
Start: 2019-01-09 — End: 2019-01-09
  Administered 2019-01-09: 20 meq via ORAL
  Filled 2019-01-09: qty 1

## 2019-01-09 MED ADMIN — potassium chloride ER 20 mEq tablet,extended release(part/cryst): ORAL | @ 16:00:00

## 2019-01-09 MED ADMIN — sodium chloride 0.9 % (flush) injection syringe: @ 06:00:00

## 2019-01-09 MED ADMIN — heparin (porcine) 1,000 unit/mL injection solution: ORAL | @ 09:00:00

## 2019-01-09 MED ADMIN — calcium carbonate 200 mg calcium (500 mg) chewable tablet: ORAL | @ 21:00:00

## 2019-01-09 MED ADMIN — famotidine 20 mg tablet: TRANSDERMAL | @ 21:00:00

## 2019-01-09 MED ADMIN — amiodarone 200 mg tablet: ORAL | @ 21:00:00

## 2019-01-09 MED ADMIN — nystatin 100,000 unit/gram topical powder: TOPICAL | @ 09:00:00

## 2019-01-09 MED ADMIN — sodium chloride 0.9 % (flush) injection syringe: INTRAVENOUS | @ 07:00:00

## 2019-01-09 MED ADMIN — lactated Ringers intravenous solution: ORAL | @ 09:00:00

## 2019-01-09 MED ADMIN — ketorolac 60 mg/2 mL intramuscular solution: ORAL | @ 12:00:00

## 2019-01-09 NOTE — Ancillary Notes (Signed)
.  Patient not ambulated due to patient eating at this time, she was supposed to be cardioverted and had been NPO, but is in SR at this time. Will see later this am, had a walk at 0800 encouraged IS usage and ambulation.    Any questions call Cardiac Rehabilitation, below    [facility]  Cardiac Rehabilitation  Phone # [phone]  ;

## 2019-01-09 NOTE — Progress Notes (Signed)
[facility]  Cardiac Surgery   Floor/Stepdown Progress Note      [name], [place]  Date of Admission:  01/04/2019  Date of Birth:  [birth year]    Hospital Day:  LOS: 5 days   Post-op Day:  5 Days Post-Op, S/P AVR 21 mm On-X  Date of Service:  01/09/2019    SUBJECTIVE: Pt OOB and into chair, resting comfortably. Pt states her nausea has subsided. Denies CP, SOB.    OBJECTIVE:    Vital Signs:  Temp (24hrs) Max:36.8 C (98.2 F)      Temperature: 36.7 C (98.1 F) (01/09/19 1112)  BP (Non-Invasive): 115/77 (01/09/19 1112)  MAP (Non-Invasive): 87 mmHG (01/09/19 1112)  Heart Rate: 81 (01/09/19 1112)  Respiratory Rate: 16 (01/09/19 1112)  Pain Score (Numeric, Faces): 0 (01/09/19 1112)  SpO2: 97 % (01/09/19 1112)    Base (Admission) Weight:  Weight: 95.1 kg (209 lb 10.5 oz)  Weight:  Weight: 93.6 kg (206 lb 5.6 oz)    Current Inpatient Medications:  acetaminophen (TYLENOL) tablet, 650 mg, Oral, Q4H PRN  amiodarone (CORDARONE) tablet, 400 mg, Oral, 2x/day    Followed by  Derrill Memo ON 01/16/2019] amiodarone (CORDARONE) tablet, 400 mg, Oral, Daily    Followed by  Derrill Memo ON 01/23/2019] amiodarone (CORDARONE) tablet, 200 mg, Oral, Daily  aspirin chewable tablet 81 mg, 81 mg, Oral, Daily  bisacodyl (DULCOLAX) rectal suppository, 10 mg, Rectal, Daily PRN  calcium carbonate (TUMS) chewable tablet, 500 mg, Oral, Daily  docusate sodium (COLACE) capsule, 100 mg, Oral, 2x/day  furosemide (LASIX) tablet, 40 mg, Oral, Daily  heparin 25,000 units in D5W 250 mL infusion, 12 Units/kg/hr (Adjusted), Intravenous, Continuous  hydroCHLOROthiazide (HYDRODIURIL) tablet, 25 mg, Oral, Daily  ipratropium (ATROVENT) 0.02% nebulizer solution, 0.5 mg, Nebulization, Q4H PRN  levalbuterol (XOPENEX) 1.25 mg/ 0.5 mL nebulizer solution, 1.25 mg, Nebulization, Q4H PRN  lidocaine-menthol (LIDOPATCH) 3.6%-1.25% patch, 1 Patch, Transdermal, Daily  magnesium hydroxide (MILK OF MAGNESIA) 400mg  per 63mL oral liquid, 30 mL, Oral, Daily PRN  metoprolol tartrate  (LOPRESSOR) tablet 37.5 mg, 37.5 mg, Oral, 2x/day  NS flush syringe, 2 mL, Intracatheter, Q8HRS    And  NS flush syringe, 2-6 mL, Intracatheter, Q1 MIN PRN  nystatin (NYSTOP) 100,000 units/g topical powder, , Apply Topically, 2x/day  ondansetron (ZOFRAN) 2 mg/mL injection, 4 mg, Intravenous, Q8H PRN  pantoprazole (PROTONIX) delayed release tablet, 40 mg, Oral, Daily before Breakfast  polyethylene glycol (MIRALAX) oral packet, 17 g, Oral, Daily  potassium chloride (K-DUR) extended release tablet, 20 mEq, Oral, 2x/day-Food  SSIP insulin lispro (HUMALOG) 100 units/mL injection, 0-12 Units, Subcutaneous, 4x/day PRN        Appropriate Home Meds restarted:  Yes    I/O:  I/O last 24 hours to current time:      Intake/Output Summary (Last 24 hours) at 01/09/2019 1329  Last data filed at 01/09/2019 1200  Gross per 24 hour   Intake 950.51 ml   Output 500 ml   Net 450.51 ml     I/O last 3 completed shifts:  In: 963.32 [P.O.:300; I.V.:663.32]  Out: 500 [Urine:500]    Nutrition/Diet:  MNT PROTOCOL FOR DIETITIAN  DIET CARDIAC (4G NA,LOWFAT,LOWCHOL) Restrict fluids to: 1800 ML    Labs:  Reviewed:  I have reviewed all lab results.    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 Aortic stenosis       PT/OT: No    DVT RISK FACTORS HAVE BEN ASSESSED AND PROPHYLAXIS ORDERED (SEE RUBYONLINE - REFERENCE TOOLS - MD, DVT PROPHY OR POCKET CARD):  YES    Disposition Planning: Home discharge      Stacey Drain, [place] 01/09/2019 13:29

## 2019-01-09 NOTE — Ancillary Notes (Signed)
Visited patient for ambulation, did not ambulate. Patient recently ambulated. Will follow up with cardiac rehabilitation for further expected ambulations.  Patient reminded that they may contact nursing/CA staff to ambulate. Patient in chair with call bell within reach. Reviewed Cardiac Surgery guidelines, restrictions, and suggestions at discharge (tomorrow).    ** See Cardiac Exercise Flowsheet/tab for further details **    [name]  [place]  [phone]

## 2019-01-09 NOTE — Discharge Instructions (Addendum)
Nursing to call report to [facility] [phone]      Discharge Recommendations/ Plan:Discharge PJ:RPZP with Home Health (code 6)      Resources: When d/c order placed, please call report to [place] @ [phone].

## 2019-01-09 NOTE — Care Management Notes (Signed)
[facility]  Care Management Note    Patient Name: [name]  Date of Birth: 30-Mar-1956  Sex: female  Date/Time of Admission: 01/04/2019  6:11 AM  Room/Bed: 19/A  Payor: Ferdinand Cava / Plan: [employer] [place] / Product Type: Managed Care /    LOS: 5 days   Primary Care Providers:  Derek Jack, APRN,FNP-B* (General)    Admitting Diagnosis:  Aortic stenosis [I35.0]    Assessment:      01/09/19 1356   Assessment Details   Assessment Type Continued Assessment   Date of Care Management Update 01/09/19   Date of Next DCP Update 01/12/19   Care Management Plan   Discharge Planning Status plan in progress   Projected Discharge Date 01/11/19   Discharge plan discussed with: Patient   Discharge Needs Assessment   Discharge Facility/Level of Care Needs Home with Home Health (code 6)   Transportation Available family or friend will provide;car     Per service, Heparin drip, bridging to Coumadin, possible Cardioversion, PT/OT recommending home health.  MSW sent signed order to [facility], pt was accepted upon DC, and updated the AVS with call report number.  Bedside nurse informed of the DC plan. Will continue to follow.    Discharge Plan:  Home with Home Health (code 6)  [place]    The patient will continue to be evaluated for developing discharge needs.     Case Manager: Darron Doom., SOCIAL WORKER  Phone: [phone]

## 2019-01-09 NOTE — Care Plan (Signed)
Problem: Adult Inpatient Plan of Care  Goal: Plan of Care Review  Flowsheets (Taken 01/09/2019 1400)  Plan of Care Reviewed With: patient  Note: Heparin drip, bridging to Coumadin, possible Cardioversion, PT/OT recommending home health.

## 2019-01-09 NOTE — Care Plan (Signed)
Plan of care reviewed with the patient. Denies pain, nausea and vomiting up to this time sinus rhythm this shift. NPO from Mid-night for possible cardioversion, Heparin  gtt at 14 units /kg/hr, Amio.  gtt infusing. FR 1800 ml. Metoprolol increased from 12.5 mg to 25 mg Daily,  Coumadin 4 mg give last night. Up to the bathroom  standby assist. Sitter select in place call light within reach

## 2019-01-09 NOTE — Ancillary Notes (Signed)
.  Patient not ambulated due to resting with eyes closed, side rails up, call bell in place. Will see later today.    Any questions call Cardiac Rehabilitation, below.    [facility]  Cardiac Rehabilitation  Phone # [phone]  ;

## 2019-01-10 ENCOUNTER — Inpatient Hospital Stay (HOSPITAL_COMMUNITY): Payer: 59

## 2019-01-10 ENCOUNTER — Ambulatory Visit (HOSPITAL_COMMUNITY): Payer: Self-pay

## 2019-01-10 ENCOUNTER — Other Ambulatory Visit (HOSPITAL_COMMUNITY): Payer: Self-pay

## 2019-01-10 DIAGNOSIS — I350 Nonrheumatic aortic (valve) stenosis: Secondary | ICD-10-CM

## 2019-01-10 DIAGNOSIS — R079 Chest pain, unspecified: Secondary | ICD-10-CM

## 2019-01-10 DIAGNOSIS — Z952 Presence of prosthetic heart valve: Secondary | ICD-10-CM | POA: Insufficient documentation

## 2019-01-10 DIAGNOSIS — Z9889 Other specified postprocedural states: Secondary | ICD-10-CM

## 2019-01-10 LAB — PT/INR
INR: 1.77 — ABNORMAL HIGH (ref 0.80–1.20)
PROTHROMBIN TIME: 20.7 s — ABNORMAL HIGH (ref 9.1–13.9)

## 2019-01-10 LAB — ECG 12-LEAD
Atrial Rate: 74 {beats}/min
Calculated P Axis: 41 degrees
Calculated R Axis: 22 degrees
Calculated T Axis: 80 degrees
PR Interval: 148 ms
QRS Duration: 92 ms
QT Interval: 428 ms
QTC Calculation: 475 ms
Ventricular rate: 74 {beats}/min

## 2019-01-10 LAB — BASIC METABOLIC PANEL
ANION GAP: 9 mmol/L (ref 4–13)
BUN/CREA RATIO: 18 (ref 6–22)
BUN: 12 mg/dL (ref 8–25)
CALCIUM: 9 mg/dL (ref 8.5–10.2)
CHLORIDE: 98 mmol/L (ref 96–111)
CO2 TOTAL: 26 mmol/L (ref 22–32)
CREATININE: 0.65 mg/dL (ref 0.49–1.10)
ESTIMATED GFR: >60 mL/min/{1.73_m2} (ref >60)
GLUCOSE: 93 mg/dL (ref 65–139)
GLUCOSE: 93 mg/dL (ref 65–139)
POTASSIUM: 4.1 mmol/L (ref 3.5–5.1)
SODIUM: 133 mmol/L — ABNORMAL LOW (ref 136–145)

## 2019-01-10 LAB — CBC
HCT: 29.6 % — ABNORMAL LOW (ref 34.8–46.0)
HGB: 10 g/dL — ABNORMAL LOW (ref 11.5–16.0)
MCH: 30.4 pg (ref 26.0–32.0)
MCHC: 33.8 g/dL (ref 31.0–35.5)
MCV: 90 fL (ref 78.0–100.0)
MPV: 8.6 fL — ABNORMAL LOW (ref 8.7–12.5)
PLATELETS: 263 10*3/uL (ref 150–400)
RBC: 3.29 10*6/uL — ABNORMAL LOW (ref 3.85–5.22)
RDW-CV: 12 % (ref 11.5–15.5)
WBC: 6.4 10*3/uL (ref 3.7–11.0)

## 2019-01-10 LAB — POC BLOOD GLUCOSE (RESULTS): GLUCOSE, POC: 105 mg/dL (ref 70–105)

## 2019-01-10 LAB — MAGNESIUM: MAGNESIUM: 1.8 mg/dL (ref 1.6–2.6)

## 2019-01-10 LAB — PTT (PARTIAL THROMBOPLASTIN TIME): APTT: 26.4 s (ref 24.2–37.5)

## 2019-01-10 MED ORDER — AMIODARONE 200 MG TABLET
400.0000 mg | ORAL_TABLET | Freq: Two times a day (BID) | ORAL | Status: DC
Start: 2019-01-11 — End: 2019-01-10

## 2019-01-10 MED ORDER — POTASSIUM CHLORIDE ER 20 MEQ TABLET,EXTENDED RELEASE(PART/CRYST)
40.0000 meq | ORAL_TABLET | Freq: Every day | ORAL | 0 refills | Status: AC
Start: 2019-01-10 — End: 2019-01-17

## 2019-01-10 MED ORDER — AMIODARONE 200 MG TABLET
400.0000 mg | ORAL_TABLET | Freq: Every day | ORAL | 0 refills | Status: DC
Start: 2019-01-18 — End: 2019-08-08

## 2019-01-10 MED ORDER — MAGNESIUM SULFATE 4 GRAM/100 ML (4 %) IN WATER INTRAVENOUS PIGGYBACK
4.0000 g | INJECTION | Freq: Once | INTRAVENOUS | Status: AC
Start: 2019-01-10 — End: 2019-01-10
  Administered 2019-01-10: 0 g via INTRAVENOUS
  Administered 2019-01-10: 4 g via INTRAVENOUS
  Filled 2019-01-10: qty 100

## 2019-01-10 MED ORDER — METOPROLOL TARTRATE 50 MG TABLET
50.0000 mg | ORAL_TABLET | Freq: Two times a day (BID) | ORAL | 2 refills | Status: DC
Start: 2019-01-10 — End: 2019-04-06

## 2019-01-10 MED ORDER — AMIODARONE 200 MG TABLET
400.0000 mg | ORAL_TABLET | Freq: Every day | ORAL | Status: DC
Start: 2019-01-18 — End: 2019-01-10

## 2019-01-10 MED ORDER — AMIODARONE 200 MG TABLET
200.0000 mg | ORAL_TABLET | Freq: Every day | ORAL | Status: DC
Start: 2019-01-25 — End: 2019-01-10

## 2019-01-10 MED ORDER — ASPIRIN 81 MG CHEWABLE TABLET
81.0000 mg | CHEWABLE_TABLET | Freq: Every day | ORAL | 3 refills | Status: DC
Start: 2019-01-11 — End: 2019-07-11

## 2019-01-10 MED ORDER — WARFARIN 2 MG TABLET
4.0000 mg | ORAL_TABLET | Freq: Every evening | ORAL | 2 refills | Status: DC
Start: 2019-01-10 — End: 2019-02-28

## 2019-01-10 MED ORDER — METOPROLOL TARTRATE 50 MG TABLET
50.00 mg | ORAL_TABLET | Freq: Two times a day (BID) | ORAL | Status: DC
Start: 2019-01-10 — End: 2019-01-10
  Administered 2019-01-10: 50 mg via ORAL
  Filled 2019-01-10: qty 1

## 2019-01-10 MED ORDER — MAGNESIUM OXIDE 400 MG (241.3 MG MAGNESIUM) TABLET
400.0000 mg | ORAL_TABLET | Freq: Two times a day (BID) | ORAL | 0 refills | Status: AC
Start: 2019-01-10 — End: 2019-01-17

## 2019-01-10 MED ORDER — AMIODARONE 200 MG TABLET
400.0000 mg | ORAL_TABLET | Freq: Two times a day (BID) | ORAL | 0 refills | Status: DC
Start: 2019-01-10 — End: 2019-08-08

## 2019-01-10 MED ORDER — AMIODARONE 200 MG TABLET
200.0000 mg | ORAL_TABLET | Freq: Every day | ORAL | 0 refills | Status: DC
Start: 2019-01-25 — End: 2019-08-08

## 2019-01-10 MED ORDER — WARFARIN 2 MG TABLET
4.0000 mg | ORAL_TABLET | Freq: Once | ORAL | Status: DC
Start: 2019-01-10 — End: 2019-01-10
  Filled 2019-01-10: qty 2

## 2019-01-10 MED ORDER — FUROSEMIDE 40 MG TABLET
40.0000 mg | ORAL_TABLET | Freq: Every day | ORAL | 0 refills | Status: AC
Start: 2019-01-11 — End: 2019-01-18

## 2019-01-10 MED ADMIN — sodium chloride 0.9 % (flush) injection syringe: @ 05:00:00

## 2019-01-10 MED ADMIN — lidocaine 4 %-menthoL 1 % topical patch: @ 15:00:00

## 2019-01-10 MED ADMIN — potassium chloride ER 20 mEq tablet,extended release(part/cryst): ORAL | @ 08:00:00

## 2019-01-10 MED ADMIN — polyethylene glycol 3350 17 gram oral powder packet: ORAL | @ 08:00:00

## 2019-01-10 MED ADMIN — aspirin 81 mg chewable tablet: ORAL | @ 08:00:00

## 2019-01-10 MED ADMIN — ALBUTEROL 2.5 MG/ IPRATROPIUM 0.5 MG INHALATION: ORAL | @ 08:00:00

## 2019-01-10 MED ADMIN — enoxaparin 40 mg/0.4 mL subcutaneous syringe: ORAL | @ 05:00:00

## 2019-01-10 MED ADMIN — sodium chloride 0.9 % (flush) injection syringe: INTRAVENOUS | @ 12:00:00

## 2019-01-10 MED ADMIN — cellulose, oxidized 2" X 1" X 1" pads: ORAL | @ 08:00:00

## 2019-01-10 NOTE — Care Management Notes (Signed)
Referral Information  ++++++ Placed Provider #1 ++++++  Case Manager: Kristen Zelnis  Provider Type: Home Health  Provider Name: [facility] ([id number])  Address:  [address]  [place], [place] [address]  Contact: Trudy Davis    Phone: [phone] x  Fax:   Fax: [phone]

## 2019-01-10 NOTE — Anticoag (Signed)
Warfarin (Coumadin) education performed on: 01/10/2019  Caregiver present: no  Topics discussed:  - indication  - INR goal  - INR blood draw frequency  - adjustment of dose based on INRs  - importance of adherence  - timing of warfarin administration  - interacting medications and natural products  - monitoring for signs of stroke and major bleeding  - Vitamin K content of foods  - holding warfarin for procedures  Patient verbalized a good understanding.    Robby Sermon, PharmD, [facility]  Cardiology Clinical Pharmacy Specialist

## 2019-01-10 NOTE — Nurses Notes (Signed)
Patient called out c/o of right arm pain that radiated to her armpit. Patient stated it was not chest pain but was described as a squeezing pain in her forearm. Patient VSS at this time. Service made aware and orders placed for EKG. EKG shows no acute changes. Patient denies the need for PRN Tylenol. Patient then stated she thinks the pain is from the multiple IVs placed in that forearm. Will continue to monitor.    Estill Bamberg Alita Waldren, RN  01/10/2019, 03:07

## 2019-01-10 NOTE — Care Management Notes (Signed)
[facility]  Care Management Note    Patient Name: [name]  Date of Birth: January 25, 1956  Sex: female  Date/Time of Admission: 01/04/2019  6:11 AM  Room/Bed: 19/A  Payor: Ferdinand Cava / Plan: [employer] [place] / Product Type: Managed Care /    LOS: 6 days   Primary Care Providers:  Derek Jack, APRN,FNP-B* (General)    Admitting Diagnosis:  Aortic stenosis [I35.0]    Assessment:      01/10/19 1058   Assessment Details   Assessment Type Continued Assessment   Date of Care Management Update 01/10/19   Date of Next DCP Update 01/13/19   Care Management Plan   Discharge Planning Status plan in progress   Projected Discharge Date 01/10/19   Discharge plan discussed with: Patient   CM will evaluate for rehabilitation potential yes   Patient choice offered to patient/family yes   Form for patient choice reviewed/signed and on chart yes   Facility or [place]   Discharge Needs Assessment   Discharge Facility/Level of Care Needs Home with Home Health (code 6)   Transportation Available car;family or friend will provide       Discharge Plan:  Home with Home Health (code 6)     Per service, patient is for discharge today. Home Health has been previously arranged-order is signed and referral sent. Number for nursing to call report placed in AVS. Bedside RN aware. [place] notified [facility] liaison, Elmyra Ricks, that patient is discharging today.       The patient will continue to be evaluated for developing discharge needs.     Case Manager: Liam Graham, [place] COORDINATOR  Phone: [phone]

## 2019-01-10 NOTE — Progress Notes (Signed)
Anticoagulation Encounter Note:    Surgical AVR plan  Warfarin start date:  01-06-2019  [facility] Staff: Lacinda Axon  DX : On-X AVR  INR Range: 2.0-3.0  Tab strength: warfarin 2mg   Contact 1: [phone] cell  Contact 2:   Lab:  [facility]  Comments:    INR: 1.77 at discharge today  Current Dose: warfarin 4mg  QD      Plan: Called and spoke with Tom from [place]. Pt to be seen for INR tomorrow  Next INR Date: 01-11-2019    Trinna Post, RN  Specialty Care Nurse, [place]  [phone] (ext [phone]) or [phone] (ext [phone])  [email or web address]          For additional information, see dosing calendar below.    Warfarin Therapy Instructions   March 2020 Details    Sun Mon Tue Wed Thu Fri Sat     1               2               3               4               5               6               7                 8               9               10               11               12               13               14                 15               16               17               18               19               20               21                 22               23    2.08   4 mg   See details      24   1.77   4 mg   See details      25      4 mg         26               27               28                  29  30               31                    Date Details   03/23 INR: 2.08      03/24 This INR check   INR: 1.77!       Date of next INR:  01/11/2019

## 2019-01-10 NOTE — Procedures (Signed)
[facility]  Division of Cardiothoracic Surgery      Date of Service: 01/10/2019    Name: [name]  Sex: female  MRN: [id number]  Age: 63 y.o.  Service: CARDIAC SURGERY    Atrial and ventricular temporary pacing wires were removed. Site was prepped with chlorhexidine. Pacing wires were clipped. Site was dressed with gauze and tape.     Henreitta Cea, PA-C  01/10/2019  11:09  Division of Cardiothoracic Surgery.

## 2019-01-10 NOTE — Ancillary Notes (Signed)
.  Patient not ambulated due to just currently pulling pacer wires, bedrest for two hours. Will see if not discharged later today.     Any questions call Cardiac Rehabilitation, below.    [place]  [facility]  Cardiac Rehabilitation  Phone # [phone]  ;

## 2019-01-10 NOTE — Care Plan (Signed)
Patients VSS throughout the shift. Patient denies any chest pain or SOB at this time. Patient continues to c/o abdominal discomfort that is minimized with rest and TUMs. Patient s/p AVR. Mediastinal incision WDL and OTA. Pacer wires remain in place and capped. Heparin gtt stopped yesterday d/t therapeutic INR and PTT. Patient received first dose of warfarin yesterday evening. Plan to discharge patient possible tomorrow. Patient up to date on patients POC. Patient has no other complaints at this time. Patient ambulates with standby assist to the bathroom. Frequent repositioning encouraged. Moderate fall precautions maintained. Call light within reach. Patient educated on calling out when necessary. Will continue to monitor.    Estill Bamberg Cesario Weidinger, RN  01/10/2019, 03:03    Problem: Adult Inpatient Plan of Care  Goal: Plan of Care Review  Outcome: Ongoing (see interventions/notes)  Goal: Patient-Specific Goal (Individualized)  Outcome: Ongoing (see interventions/notes)  Goal: Absence of Hospital-Acquired Illness or Injury  Outcome: Ongoing (see interventions/notes)  Goal: Optimal Comfort and Wellbeing  Outcome: Ongoing (see interventions/notes)  Goal: Rounds/Family Conference  Outcome: Ongoing (see interventions/notes)

## 2019-01-10 NOTE — Discharge Summary (Signed)
[facility]  DISCHARGE SUMMARY    PATIENT NAME:  [name], [name]  MRN:  [id number]  DOB:  August 19, 1956    ENCOUNTER DATE:  01/04/2019  INPATIENT ADMISSION DATE: 01/04/2019  DISCHARGE DATE:  01/10/2019    ATTENDING PHYSICIAN: [name], MD  SERVICE: CARDIAC SURGERY  PRIMARY CARE PHYSICIAN: Derek Jack, APRN,FNP-BC             PRIMARY DISCHARGE DIAGNOSIS:   Active Hospital Problems    Diagnosis Date Noted   . Aortic stenosis [I35.0] 12/01/2018      Resolved Hospital Problems   No resolved problems to display.     Active Non-Hospital Problems    Diagnosis Date Noted   . Severe aortic stenosis 11/08/2018   . Low back pain radiating to left leg 12/02/2016   . S/P hip replacement 08/13/2016   . Essential hypertension 02/12/2016        DISCHARGE MEDICATIONS:     Current Discharge Medication List      START taking these medications.      Details   * amiodarone 200 mg Tablet  Commonly known as:  PACERONE   400 mg, Oral, 2 TIMES DAILY  Qty:  28 Tab  Refills:  0     * amiodarone 200 mg Tablet  Commonly known as:  PACERONE  Start taking on:  January 18, 2019   400 mg, Oral, DAILY  Qty:  14 Tab  Refills:  0     * amiodarone 200 mg Tablet  Commonly known as:  PACERONE  Start taking on:  January 25, 2019   200 mg, Oral, DAILY  Qty:  7 Tab  Refills:  0     aspirin 81 mg Tablet, Chewable  Start taking on:  January 11, 2019   81 mg, Oral, DAILY  Qty:  30 Tab  Refills:  3     furosemide 40 mg Tablet  Commonly known as:  LASIX  Start taking on:  January 11, 2019   40 mg, Oral, DAILY  Qty:  7 Tab  Refills:  0     magnesium oxide 400 mg Tablet  Commonly known as:  MAG-OX   400 mg, Oral, 2 TIMES DAILY  Qty:  14 Tab  Refills:  0     metoprolol tartrate 50 mg Tablet  Commonly known as:  LOPRESSOR   50 mg, Oral, 2 TIMES DAILY  Qty:  60 Tab  Refills:  2     potassium chloride 20 mEq Tab Sust.Rel. Particle/Crystal  Commonly known as:  K-DUR   40 mEq, Oral, DAILY  Qty:  14 Tab  Refills:  0     warfarin 2 mg Tablet  Commonly known  as:  COUMADIN   4 mg, Oral, EVERY EVENING  Qty:  60 Tab  Refills:  2         * This list has 3 medication(s) that are the same as other medications prescribed for you. Read the directions carefully, and ask your doctor or other care provider to review them with you.            CONTINUE these medications - NO CHANGES were made during your visit.      Details   acetaminophen 500 mg Tablet  Commonly known as:  TYLENOL   500 mg, Oral, EVERY 4 HOURS PRN  Refills:  0     * clobetasoL 0.05 % Cream  Commonly known as:  TEMOVATE   Apply Topically, 2  TIMES DAILY  Qty:  1 Tube  Refills:  1     * clobetasoL 0.05 % Solution  Commonly known as:  CORMAX   Apply Topically, 2 TIMES DAILY  Qty:  1 Bottle  Refills:  1     fluticasone propionate 50 mcg/actuation Spray, Suspension  Commonly known as:  FLONASE   1 Spray, Each Nostril, DAILY  Qty:  3 Bottle  Refills:  0     loratadine 10 mg Tablet  Commonly known as:  CLARITIN   10 mg, Oral, DAILY  Refills:  0     multivitamin Tablet   1 Tab, Oral, DAILY  Refills:  0         * This list has 2 medication(s) that are the same as other medications prescribed for you. Read the directions carefully, and ask your doctor or other care provider to review them with you.            STOP taking these medications.    CANNABIDIOL (CBD) EXTRACT ORAL     hydroCHLOROthiazide 25 mg Tablet  Commonly known as:  HYDRODIURIL     naproxen 500 mg Tablet  Commonly known as:  NAPROSYN          Discharge med list refreshed?  YES     During this hospitalization did the patient have an AMI, PCI/PCTA, STENT or Isolated CABG?  No              CARDIOVASCULAR MEDICATIONS:  ASA: Yes  Statin: No. Reason: not indicated  Antiplatelets (Plavix, Brilinta, Effient): No. Reason: not indicated  Anticoagulants (Coumadin, Xarelto, Eliquis): Yes  Beta Blocker:  Yes  ACEI/ARB/ARNI: No  Spironolactone Indicated (Heart Failure): No        Heart Failure Discharge Labs  First admission (Creatinine): 60  Discharge Creatinine: Lab Results      Component                Value               Date                     CREATININE               0.65                01/10/2019            First admission (BUN)(BUN/Creatinine ration):   Discharge: BUN     Date                     Value               Ref Range           Status              01/10/2019               12                  8 - 25 mg/dL        Final          ----------  Warfarin: yes [facility]  Trinna Post, RN and Leone Brand, BSN, RN  Phone:  [phone]  Fax:  [phone]      ALLERGIES:  Allergies   Allergen Reactions   . Iv Contrast Anaphylaxis   . Oregano      Hives     .  Stadol [Butorphanol Tartrate] Mental Status Effect   . Tomato Hives/ Urticaria         Weight:   Admit:  Weight: 95.1 kg (209 lb 10.5 oz) Discharge: Wt Readings from Last 1 Encounters:01/10/19 : 91.9 kg (202 lb 9.6 oz)         HOSPITAL PROCEDURE(S):   Bedside Procedures:  Orders Placed This Encounter   Procedures   . BEDSIDE EPICARDIAL PACEMAKER WIRE REMOVAL     Surgical Procedure(s):  Mini sternotomy, aortic valve replacement with an undetermined valve type  PERFUSION CHARGE/STBY/CARDIOPULMONARY BYPASS  AUTOLOGOUS BLOOD CONSERVATION SETUP  AUTOLOGOUS BLOOD CONSERVATION, MONITORING PER HOUR    REASON FOR HOSPITALIZATION AND HOSPITAL COURSE     BRIEF HPI:  This is a 63 y.o., female  with PMH of aortic stenosis, HTN, emphysema, former smoker, arthritis, s/p THA and hernia repair who was seen in cardiac surgery outpatient clinic for evaluation of ascending aortic aneurysm and AS. Per precious providers H&P, pt reported mild dyspnea and mild dizziness. Denied CP, orthopnea, palpitations, syncope, or edema. CT chest on 11/25/17 revealed ascending aortic aneurysm of 4.0. TTE on 11/25/17 revealed EF of 55%, no regional wall abnormalities, mild AI, mild MR and severe AS (mean of 43.8, VMAX of 400.8, AVA of 0.8).TTE on  12/01/18 for her 1 year follow up  revealed critical AS(mean of 79.7, VMAX 550.9  and AVA of 0.6). CT scan revealedascending aorta dilation of approximately 4.0-4.1 cm. Cardiac cath on 12/06/18 revealed normal coronary arteries. Pt was deemed appropriate surgical candidate and readmitted on 01/04/2019 for AVR.     BRIEF HOSPITAL NARRATIVE: On 01/04/2019, pt was taken the the OR and received a minimally invasive aortic valve replacement via upper partial sternotomy with a 21 mm On-X mechanical valve. She tolerated the surgery w/o complications and was taken to the CVICU, postoperatively, extubated and on no gtts. Pt was transferred to stepdown unit on POD #2. On POD #3, diuresis was started, and chest tube was removed. Heparin gtt started for bridging to Coumadin.On POD #4, pt went into atrial fibrillation. Pt was started on amio gtt and amio po taper. She converted back to NSR the next day an amio gtt was d/c'ed. On POD #6 pacer wires were cut (due to anticoagulation). Pt was ambulating and having bowel movements. Hospital course was complicated by persistent nausea that lasted for several days. Nausea subsided on POD #5. She was evaluated with cardiac surgery staff and deemed appropriate for discharge today. INR reviewed with Dr. Lacinda Axon.     TRANSITION/POST DISCHARGE CARE/PENDING TESTS/REFERRALS:     Pt to f/u with Dr. Lacinda Axon in 3 weeks and Dr. Earlie Raveling in 4 weeks. F/u with PCP in 4 weeks, as well. She is being discharged home on  Amiodarone taper for post-op atrial fibrillation that resolved on POD #5.        CONDITION ON DISCHARGE:  A. Ambulation: Full ambulation  B. Self-care Ability: With partial assistance  C. Cognitive Status Alert and Oriented x 3  D. Code status at discharge:   Code Status Information     Code Status    Full Code                 LINES/DRAINS/WOUNDS AT DISCHARGE:   Patient Lines/Drains/Airways Status    Active Line / Dialysis Catheter / Dialysis Graft / Drain / Airway / Wound     Name: Placement date: Placement time: Site: Days:    Peripheral IV Left;Upper Cephalic  (lateral  side of  arm)  01/05/19   0530   5    Surgical Incision Mediastinal Chest  01/04/19   -   6                DISCHARGE DISPOSITION:  Home discharge              DISCHARGE INSTRUCTIONS:  [place] .    Specialty:  Cardiac Surgery  Contact information:  [address]  [place] [address]  [phone]  Additional information:  For driving directions to the [facility] in [place], [place], please call [phone]-CARE [phone]). You may also visit our website at [email or web address].*Valet parking is available to patients at [facility] for free and tipping is not required. *Visitors to our main campus will Location manager as we are expanding to better serve you. We apologize for any inconvenience this may cause and appreciate your patience.           [facility] .    Specialty:  Cardiology  Contact information:  [place] [address]  [phone]  Additional information:  For driving directions to the [facility] in [place], [place], please call [phone]-CARE [phone]). You may also visit our website at [email or web address].*Valet parking is available to patients at [facility] for free and tipping is not required. *Visitors to our main campus will Location manager as we are expanding to better serve you. We apologize for any inconvenience this may cause and appreciate your patience.                  XR CHEST PA AND LATERAL    Please schedule with return to [facility] clinic visit.     Ordering Provider Stacey Drain [phone]      PT/INR --  Please select expected date     PT/INR --  Please select expected date     Refer to [place]   Referral Type: Physician Referral-Office Visits   Number of Visits Requested: 1     Refer to [facility]tico[facility]   Referral Type: Physician Referral-Office Visits   Number of Visits Requested: 1     DISCHARGE INSTRUCTION - WARFARIN (COUMADIN) FOLLOW-UP     Provider to monitor warfarin: [facility]VI anticoagulation clinic    Next INR to be drawn: 01/11/2019      DISCHARGE INSTRUCTION - ACTIVITY    We suggest a cautious, yet progressive plan to safely regain your normal body and life functions. In time, you should be able to do your regular routine tasks, return to work, and take part in recreational activities. Here are a few tips:  Get up and get dressed each morning. Please do not stay in bed. Do wear comfortable clothes each day, break up long tasks into shorter parts, and space them over the day and stop your tasks before you get tired, if you do too much, you will likely be tired the next day and need to rest, which will slow recovery, balance your activity with rest times. Your body may give you signals to rest, these include symptoms such as shortness of breath, fatigue, dizziness, and pain, rest when possible or needed. If you need to climb stairs, we suggest going slowly at first. Always remember that it takes more energy to climb  a flight of steps then to walk on a level surface. If you start to become tired or have shortness of breath, stop, rest for a few minutes,   and   then continue. You should only use a railing for your balance, please do not pull yourself up the stairs as the strain can damage your incision and put excess pressure on your body.     Activity: GRADUALLY INCREASE ACTIVITY AS TOLERATED    Activity: NO LIFTING OVER 10 POUNDS FOR 2 WEEKS    Activity: NO VIGOROUS/STRENUOUS ACTIVITY      DISCHARGE INSTRUCTION - DRIVING    You will not be cleared to safely drive a car until after your visit with your surgeon, this visit is often scheduled four weeks after your surgery. No driving while taking Narcotic (pain) medications.  If you were to be involved in a car accident, you would  hurt your sternum(breast bone) or other areas that have undergone surgical procedures. When you are a passenger in a car, we suggest you ride in the back seat, if possible. If you are riding in the front seat, move as far back as possible to increase leg room, and use a pillow between your chest and the seat belt for added comfort and to avoid any irritation on your incision. We advise against taking long trips without a doctors approval, and when approved be sure to allow ample time for stops to walk and stretch your legs and arms.     DISCHARGE INSTRUCTION - INCISION/WOUND CARE    Please follow these steps to care for your incision(s): breathe in through your nose as you raise your arms during activity, breathe out through your mouth as you lower your arms. Never hold your breath, you may raise your arms over your head to brush or shampoo your hair. Be careful when reaching. The breastbone and the muscles around it may be very sore, if this was the approach for your surgery, do not lift anything heavier than 5 pounds for 4 weeks after surgery,   (if your incision is located on either side of your chest, you may not lift anything heavier than 10 pounds for 2 weeks after your surgery),  you should not push or pull with your arms, especially when rising from a bed, assistive devices, such as canes or walkers, can be used only for balance. Do not place your full weight on any of these devices until the incision is fully healed, it is very important to keep your incisions clean and dry. Follow these guidelines: shower daily, pat dry. Do not take a tub bath for 4   weeks or until your surgeon says you may. Wash your incisions with an antimicrobial soap and water. Always use a clean cloth. Do not put any creams, lotions, or antibiotic ointments on the incisions. Keep your legs raised when sitting for more than 15 minutes, and remember to stretch. Do not wear any tight clothing that may rub against your incisions, causing  irritation.     Instructions for incision/wound care: Wound Care as Instructed      DISCHARGE INSTRUCTION - IF THE FOLLOWING INFORMATION APPLIES OR IF YOU HAVE ANY QUESTIONS, PLEASE CONTACT THE SURGEON'S OFFICE AND SPEAK WITH RACHEL, RN AT [phone], OR LEXI, RNAT [phone]. THE HVI CALL CENTER CAN ALSO BE REACHED AT [phone].    Call for:  Increased warmth in the skin around an incision, redness that spreads out more than one inch from  incision edges, increase of swelling, tightness, or pain around an incision, large amount of clear or pinkish drainage, sudden increase in the amount of drainage, white, yellow, or green drainage with odor coming from an incision, fever higher than 101.0 F (38.3 C), chills or temperature of 99.0 F (37.2 C) to 100.9 F (38.27 C) for more than 3 days.  Continued or severe sadness, lightheadedness/dizziness, increased shortness of breath, burning when passing urine, severe calf pain, new severe pain in your chest, heart rate greater than 110 beats per minute or less than 50 beats per minute.     DISCHARGE INSTRUCTION - REFER TO HEART SURGERY BOOKLET    You have been provided with the comprehensive Heart Surgery: Your Heart -> Our Focus booklet.  Please continue to refer to this during your recovery process.  It not only educates you on the discharge instructions provided to you today but also additional instructions for caring for yourself at home, heart healthy way of life, heart healthy food plan and other items to remember.     DISCHARGE INSTRUCTION - DIET -- Cardiac Diet     Diet: CARDIAC DIET      DISCHARGE INSTRUCTION - WARFARIN (COUMADIN) FOLLOW-UP     Provider to monitor warfarin: HVI anticoagulation clinic    Next INR to be drawn: 01/11/2019      ECG 12-LEAD    Schedule with return to [facility] clinic.     Reason for Exam S/P CARDIAC SURGERY      CARDIAC REHAB PHASE II    Individualized education will be given based on the patient's diagnosis and needs.     Indication  for Therapy VALVE REPLACEMENT OR REPAIR    Method to Determine THR for Program Duration TO BE DETERMINED BY THE CARDIAC REHAB STAFF    Exercise Intensity or MET Level TO BE DETERMINED BY CARDIAC REHAB STAFF      TRANSTHORACIC ECHOCARDIOGRAM - ADULT    Please schedule with return to [facility] clinic visit.     Purpose of TTE? General Diagnostic AVR   Ht 166.2 cm    Wt 91.9 kg    BMI 33.27      FOLLOW-UP: [facility] - [place], [place]     Follow-up in: 3 WEEKS    Reason for visit: HOSPITAL DISCHARGE    Provider: Marin Shutter      FOLLOW-UP: [place], [place]     Follow-up in: [place]    Reason for visit: HOSPITAL DISCHARGE    Provider: Pete Glatter, PA-C    Copies sent to Care Team       Relationship Specialty Notifications Start End    Derek Jack, [place] PCP - General Family Practice-BA  01/08/16     Phone: [phone] Fax: [phone]         [address] [place] [address]    Joaquin Bend, MD Cardiology Fellow CARDIOLOGY  05/01/16     Phone: [phone] Fax: [phone]         [place] [address]            Referring providers can utilize [email or web address] to access their referred [place] patient's information.

## 2019-01-10 NOTE — Nurses Notes (Signed)
Patient given discharge instructions as directed by MD and handouts on all new medications. Patient verbalizes understanding of all discharge instructions and follow up appointments. No questions at this time from patient. Report called to [facility]. Patient received education on coumadin from pharmacist and RN. Patient received education on heart surgery post op care from cardiac rehab and RN. Will continue with discharge. Patient stable at time of discharge.

## 2019-01-10 NOTE — Progress Notes (Signed)
[facility]  Cardiac Surgery   Floor/Stepdown Progress Note      Ammy, Lienhard  Date of Admission:  01/04/2019  Date of Birth:  August 13, 1956    Hospital Day:  LOS: 6 days   Post-op Day:  6 Days Post-Op, S/P AVR 21 mm On-X  Date of Service:  01/10/2019    SUBJECTIVE: Pt resting comfortably in bed. Complains of some abdominal cramps. BM (3/22). She is excited to go home today. Denies CP, SOB.     OBJECTIVE:    Vital Signs:  Temp (24hrs) Max:36.8 C (98.3 F)      Temperature: 36.7 C (98 F) (01/10/19 1047)  BP (Non-Invasive): 121/75 (01/10/19 1047)  MAP (Non-Invasive): 90 mmHG (01/10/19 1047)  Heart Rate: 77 (01/10/19 1047)  Respiratory Rate: 16 (01/10/19 1047)  Pain Score (Numeric, Faces): 0 (01/10/19 1047)  SpO2: 99 % (01/10/19 1047)    Base (Admission) Weight:  Weight: 95.1 kg (209 lb 10.5 oz)  Weight:  Weight: 91.9 kg (202 lb 9.6 oz)    Current Inpatient Medications:  acetaminophen (TYLENOL) tablet, 650 mg, Oral, Q4H PRN  [START ON 01/11/2019] amiodarone (CORDARONE) tablet, 400 mg, Oral, 2x/day    Followed by  Derrill Memo ON 01/18/2019] amiodarone (CORDARONE) tablet, 400 mg, Oral, Daily    Followed by  Derrill Memo ON 01/25/2019] amiodarone (CORDARONE) tablet, 200 mg, Oral, Daily  aspirin chewable tablet 81 mg, 81 mg, Oral, Daily  bisacodyl (DULCOLAX) rectal suppository, 10 mg, Rectal, Daily PRN  calcium carbonate (TUMS) chewable tablet, 500 mg, Oral, Daily  furosemide (LASIX) tablet, 40 mg, Oral, Daily  ipratropium (ATROVENT) 0.02% nebulizer solution, 0.5 mg, Nebulization, Q4H PRN  levalbuterol (XOPENEX) 1.25 mg/ 0.5 mL nebulizer solution, 1.25 mg, Nebulization, Q4H PRN  lidocaine-menthol (LIDOPATCH) 3.6%-1.25% patch, 1 Patch, Transdermal, Daily  magnesium hydroxide (MILK OF MAGNESIA) 400mg  per 52mL oral liquid, 30 mL, Oral, Daily PRN  magnesium sulfate 4 G in SW 100 mL premix IVPB, 4 g, Intravenous, Once  metoprolol tartrate (LOPRESSOR) tablet, 50 mg, Oral, 2x/day  NS flush syringe, 2 mL, Intracatheter, Q8HRS     And  NS flush syringe, 2-6 mL, Intracatheter, Q1 MIN PRN  nystatin (NYSTOP) 100,000 units/g topical powder, , Apply Topically, 2x/day  ondansetron (ZOFRAN) 2 mg/mL injection, 4 mg, Intravenous, Q8H PRN  pantoprazole (PROTONIX) delayed release tablet, 40 mg, Oral, Daily before Breakfast  polyethylene glycol (MIRALAX) oral packet, 17 g, Oral, Daily  potassium chloride (K-DUR) extended release tablet, 20 mEq, Oral, 2x/day-Food  warfarin (COUMADIN) tablet, 4 mg, Oral, Once 2100        Appropriate Home Meds restarted:  Yes    I/O:  I/O last 24 hours to current time:      Intake/Output Summary (Last 24 hours) at 01/10/2019 1111  Last data filed at 01/10/2019 1000  Gross per 24 hour   Intake 833.44 ml   Output 625 ml   Net 208.44 ml     I/O last 3 completed shifts:  In: 875.31 [P.O.:730; I.V.:145.31]  Out: 625 [Urine:625]    Nutrition/Diet:  MNT PROTOCOL FOR DIETITIAN  DIET CARDIAC (4G NA,LOWFAT,LOWCHOL) Restrict fluids to: 1800 ML    Labs:  Reviewed:  I have reviewed all lab results.    Radiology:    Reviewed:  CXR:  Direct visualization of the image on 01/10/2019 on [place] PACS showed  Stable aeration    Physical Exam:    Constitutional: A&O x3; NAD  Eyes: conjunctiva clear; EOM's intact bilaterally  ENT: moist oral mucosa; patent nares  Cardiovascular: RRR; no jvd; no pedal edema  Respiratory: normal effort; CTAB  Gastrointestinal: NT/ND; BS normoactive  Musculoskeletal: MAE  Integumentary: skin warm and dry  Psychiatric: appropriate affect and judgement    ASSESSMENT/PLAN:    S/P AVR 21 mm On-X  -ASA  -Lasix 40 mg po bid  -increased Lopressor 50 mg bid  -goal INR 2-2.5   -warfarin 4 mg tonight  -continue bowel regimen  -pulm toilet/IS; ambulate as tolerated with assistance    HTN  -BB    Atrial Fibrillation  -NSR  -po Amio taper     Dispo: discharge home today    Problem List:  Active Hospital Problems    Diagnosis   . Aortic stenosis       PT/OT: No    DVT RISK FACTORS HAVE BEN ASSESSED AND PROPHYLAXIS ORDERED (SEE  RUBYONLINE - REFERENCE TOOLS - MD, DVT PROPHY OR POCKET CARD):  YES    Disposition Planning: Home discharge      Stacey Drain, [place] 01/10/2019 11:11

## 2019-01-10 NOTE — Ancillary Notes (Signed)
Received a call from patient's bedside nurse stating patient was getting discharged and requested education. Patient was educated on 01/06/2019, visited patient to review  lifting/pushing/pulling restrictions, stair climbing, driving, wound care, diet/exercise and Phase II cardiac rehabilitation.  Patient verbalized understanding, questions were answered and showed her where to find CR phone number if questions arise in the future.  Patient in chair with call bell within reach. Pharmacist entered room as I was exiting.    Joey Lierman A. Elvis Coil, [place], EP-C  Cardiac Rehabilitation  [phone]

## 2019-01-11 ENCOUNTER — Encounter (FREE_STANDING_LABORATORY_FACILITY)
Admit: 2019-01-11 | Discharge: 2019-01-11 | Disposition: A | Discharge status: Home / Self Care | Payer: Medicaid Other | Attending: THORACIC SURGERY CARDIOTHORACIC VASCULAR SURGERY | Admitting: THORACIC SURGERY CARDIOTHORACIC VASCULAR SURGERY

## 2019-01-11 ENCOUNTER — Encounter (HOSPITAL_COMMUNITY): Payer: Self-pay

## 2019-01-11 ENCOUNTER — Encounter (FREE_STANDING_LABORATORY_FACILITY): Payer: 59 | Admitting: THORACIC SURGERY CARDIOTHORACIC VASCULAR SURGERY

## 2019-01-11 ENCOUNTER — Ambulatory Visit (HOSPITAL_COMMUNITY): Payer: Self-pay

## 2019-01-11 DIAGNOSIS — R6889 Other general symptoms and signs: Secondary | ICD-10-CM | POA: Insufficient documentation

## 2019-01-11 DIAGNOSIS — R7989 Other specified abnormal findings of blood chemistry: Secondary | ICD-10-CM

## 2019-01-11 LAB — CBC WITH DIFF
BASOPHIL #: <0.1 x10ˆ3/uL (ref <=0.20)
BASOPHIL %: 0 %
BASOPHIL %: 0 %
EOSINOPHIL #: 0.22 x10ˆ3/uL (ref <=0.50)
EOSINOPHIL %: 2 %
HCT: 34.4 % — ABNORMAL LOW (ref 34.8–46.0)
HGB: 11.3 g/dL — ABNORMAL LOW (ref 11.5–16.0)
HGB: 11.3 g/dL — ABNORMAL LOW (ref 11.5–16.0)
IMMATURE GRANULOCYTE #: <0.1 x10ˆ3/uL (ref <0.10)
IMMATURE GRANULOCYTE %: 1 % (ref 0–1)
LYMPHOCYTE #: 1.99 10*3/uL (ref 1.00–4.80)
LYMPHOCYTE %: 20 %
MCH: 30.1 pg (ref 26.0–32.0)
MCHC: 32.8 g/dL (ref 31.0–35.5)
MCHC: 32.8 g/dL (ref 31.0–35.5)
MCV: 91.7 fL (ref 78.0–100.0)
MONOCYTE #: 0.76 x10ˆ3/uL (ref 0.20–1.10)
MONOCYTE %: 8 %
MPV: 8.6 fL — ABNORMAL LOW (ref 8.7–12.5)
NEUTROPHIL #: 6.67 x10ˆ3/uL (ref 1.50–7.70)
NEUTROPHIL %: 69 %
PLATELETS: 468 x10ˆ3/uL — ABNORMAL HIGH (ref 150–400)
RBC: 3.75 x10ˆ6/uL — ABNORMAL LOW (ref 3.85–5.22)
RDW-CV: 12 % (ref 11.5–15.5)
WBC: 9.8 x10ˆ3/uL (ref 3.7–11.0)

## 2019-01-11 LAB — CBC/DIFF - CLIENT CONSOLIDATED
BASOPHIL #: <0.1 x10ˆ3/uL (ref <=0.20)
BASOPHIL %: 0 %
EOSINOPHIL #: 0.22 x10ˆ3/uL (ref <=0.50)
EOSINOPHIL %: 2 %
HCT: 34.4 % — ABNORMAL LOW (ref 34.8–46.0)
HGB: 11.3 g/dL — ABNORMAL LOW (ref 11.5–16.0)
IMMATURE GRANULOCYTE #: <0.1 x10ˆ3/uL (ref <0.10)
IMMATURE GRANULOCYTE %: 1 % (ref 0–1)
LYMPHOCYTE #: 1.99 x10ˆ3/uL (ref 1.00–4.80)
LYMPHOCYTE %: 20 %
MCH: 30.1 pg (ref 26.0–32.0)
MCHC: 32.8 g/dL (ref 31.0–35.5)
MCV: 91.7 fL (ref 78.0–100.0)
MONOCYTE #: 0.76 x10ˆ3/uL (ref 0.20–1.10)
MONOCYTE %: 8 %
MONOCYTE %: 8 %
MPV: 8.6 fL — ABNORMAL LOW (ref 8.7–12.5)
NEUTROPHIL #: 6.67 x10ˆ3/uL (ref 1.50–7.70)
NEUTROPHIL %: 69 %
PLATELETS: 468 x10ˆ3/uL — ABNORMAL HIGH (ref 150–400)
RBC: 3.75 x10ˆ6/uL — ABNORMAL LOW (ref 3.85–5.22)
RDW-CV: 12 % (ref 11.5–15.5)
WBC: 9.8 x10ˆ3/uL (ref 3.7–11.0)

## 2019-01-11 LAB — BASIC METABOLIC PANEL
ANION GAP: 11 mmol/L (ref 4–13)
BUN/CREA RATIO: 23 — ABNORMAL HIGH (ref 6–22)
BUN: 22 mg/dL (ref 8–25)
CALCIUM: 9.1 mg/dL (ref 8.5–10.2)
CHLORIDE: 94 mmol/L — ABNORMAL LOW (ref 96–111)
CO2 TOTAL: 28 mmol/L (ref 22–32)
CREATININE: 0.97 mg/dL (ref 0.49–1.10)
ESTIMATED GFR: >60 mL/min/1.73m?2 (ref >60)
ESTIMATED GFR: >60 mL/min/1.73mˆ2 (ref >60)
GLUCOSE: 142 mg/dL — ABNORMAL HIGH (ref 65–139)
POTASSIUM: 4.2 mmol/L (ref 3.5–5.1)
SODIUM: 133 mmol/L — ABNORMAL LOW (ref 136–145)

## 2019-01-11 LAB — PT/INR: INR: 2

## 2019-01-11 NOTE — Progress Notes (Signed)
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  INR Range: 2.0-3.0  Tab strength: warfarin 2mg   Contact 1: [phone] cell  Contact 2:   Lab: [facility]  Comments:    INR: 2.0  Current Dose: warfarin 4mg  QD      Plan: [facility] nurse was called and instructed for pt to continue warfarin 4mg  daily with INR check on Friday  Next INR Date: 01-13-2019    Trinna Post, RN  Specialty Care Nurse, [place]  [phone] (ext [phone]) or [phone] (ext [phone])  [email or web address]          For additional information, see dosing calendar below.    Warfarin Therapy Instructions   March 2020 Details    Sun Mon Tue Wed Thu Fri Sat     1               2               3               4               5               6               7                 8               9               10               11               12               13               14                 15               16               17               18               19               20               21                 22               23               24    1.77   4 mg   See details      25   2.0   4 mg   See details      26      4 mg         27      4 mg         28                 29  30               31                    Date Details   03/24 Last INR check   INR: 1.77!      03/25 This INR check   INR: 2.0       Date of next INR:  01/13/2019

## 2019-01-12 ENCOUNTER — Telehealth (HOSPITAL_COMMUNITY): Payer: Self-pay | Admitting: THORACIC SURGERY CARDIOTHORACIC VASCULAR SURGERY

## 2019-01-12 NOTE — Telephone Encounter (Signed)
Cardiac Surgery- [facility] Discharge Phone Call     Patient Name: [name]   Date of Birth: Jul 14, 1956  MRN: [id number]  PCP: Derek Jack, APRN,FNP-BC  Cardiologist:     Damaris Schooner with: Patient    Current Outpatient Services:home with home health services. Your home care provider is n/a. The phone number to reach them is n/a.     Health Status:    1. Are you experiencing any fevers of chills? no  2. Are you having any wound drainage? no  3. Are you having any dyspnea, orthopnea, congestion, or cough? no  4. Are you having any swelling in the hands, feet, or abdomen?no  5. Are you experiencing any nausea or vomiting?yes, she is complaining of vomiting, stomach cramps, and diarrhea. She said this happens 5-6 hours after she takes her medications.  She also said these symptoms started prior to her discharge from the hospital.   6. Are you having any difficulty with passing or making urine?no  7. Are you having any difficulty moving your bowels?no  a. Last Bowel Movement: She reports her bowel movements are more formed than they have been. She has had multiple bowel movements today.   8. Are you having any dizziness or lightheadedness?yes  9. Are you having any chest pain or increased incisional discomfort? no    Medications:    DISCHARGE MEDICATIONS:         Current Discharge Medication List          START taking these medications.      Details   * amiodarone 200 mg Tablet  Commonly known as:  PACERONE   400 mg, Oral, 2 TIMES DAILY  Qty:  28 Tab  Refills:  0     * amiodarone 200 mg Tablet  Commonly known as:  PACERONE  Start taking on:  January 18, 2019   400 mg, Oral, DAILY  Qty:  14 Tab  Refills:  0     * amiodarone 200 mg Tablet  Commonly known as:  PACERONE  Start taking on:  January 25, 2019   200 mg, Oral, DAILY  Qty:  7 Tab  Refills:  0     aspirin 81 mg Tablet, Chewable  Start taking on:  January 11, 2019   81 mg, Oral, DAILY  Qty:  30 Tab  Refills:  3        furosemide 40 mg Tablet  Commonly known as:  LASIX  Start taking on:  January 11, 2019   40 mg, Oral, DAILY  Qty:  7 Tab  Refills:  0     magnesium oxide 400 mg Tablet  Commonly known as:  MAG-OX   400 mg, Oral, 2 TIMES DAILY  Qty:  14 Tab  Refills:  0     metoprolol tartrate 50 mg Tablet  Commonly known as:  LOPRESSOR   50 mg, Oral, 2 TIMES DAILY  Qty:  60 Tab  Refills:  2     potassium chloride 20 mEq Tab Sust.Rel. Particle/Crystal  Commonly known as:  K-DUR   40 mEq, Oral, DAILY  Qty:  14 Tab  Refills:  0     warfarin 2 mg Tablet  Commonly known as:  COUMADIN   4 mg, Oral, EVERY EVENING  Qty:  60 Tab  Refills:  2         All prescriptions filled?yes  Med adher: The patient reports adherence to this regimen  Results for [name], [name] (MRN [id number]) as of 01/12/2019 10:45   Ref. Range 01/11/2019 13:50 01/11/2019 13:50   WBC Latest Ref Range: 3.7 - 11.0 x10^3/uL 9.8 9.8   HGB Latest Ref Range: 11.5 - 16.0 g/dL 11.3 (L) 11.3 (L)   HCT Latest Ref Range: 34.8 - 46.0 % 34.4 (L) 34.4 (L)   PLATELET COUNT Latest Ref Range: 150 - 400 x10^3/uL 468 (H) 468 (H)   RBC Latest Ref Range: 3.85 - 5.22 x10^6/uL 3.75 (L) 3.75 (L)   MCV Latest Ref Range: 78.0 - 100.0 fL 91.7 91.7   MCHC Latest Ref Range: 31.0 - 35.5 g/dL 32.8 32.8   MCH Latest Ref Range: 26.0 - 32.0 pg 30.1 30.1   RDW-CV Latest Ref Range: 11.5 - 15.5 % 12.0 12.0   MPV Latest Ref Range: 8.7 - 12.5 fL 8.6 (L) 8.6 (L)   PMN'S Latest Units: % 69 69   LYMPHOCYTES Latest Units: % 20 20   EOSINOPHIL Latest Units: % 2 2   MONOCYTES Latest Units: % 8 8   BASOPHILS Latest Units: % 0 0   IMMATURE GRANULOCYTE % Latest Ref Range: 0 - 1 % 1 1   IMMATURE GRANULOCYTE # Latest Ref Range: <0.10 x10^3/uL <0.10 <0.10   PMN ABS Latest Ref Range: 1.50 - 7.70 x10^3/uL 6.67 6.67   LYMPHS ABS Latest Ref Range: 1.00 - 4.80 x10^3/uL 1.99 1.99   EOS ABS Latest Ref Range: <=0.50 x10^3/uL 0.22 0.22   MONOS ABS Latest Ref Range: 0.20 - 1.10 x10^3/uL 0.76 0.76   BASOS ABS Latest Ref  Range: <=0.20 x10^3/uL <0.10 <0.10   SODIUM Latest Ref Range: 136 - 145 mmol/L 133 (L)    POTASSIUM Latest Ref Range: 3.5 - 5.1 mmol/L 4.2    CHLORIDE Latest Ref Range: 96 - 111 mmol/L 94 (L)    CARBON DIOXIDE Latest Ref Range: 22 - 32 mmol/L 28    BUN Latest Ref Range: 8 - 25 mg/dL 22    CREATININE Latest Ref Range: 0.49 - 1.10 mg/dL 0.97    GLUCOSE Latest Ref Range: 65 - 139 mg/dL 142 (H)    ANION GAP Latest Ref Range: 4 - 13 mmol/L 11    BUN/CREAT RATIO Latest Ref Range: 6 - 22  23 (H)    ESTIMATED GLOMERULAR FILTRATION RATE Latest Ref Range: >60 mL/min/1.4m^2 >60    CALCIUM Latest Ref Range: 8.5 - 10.2 mg/dL 9.1      Medications, lab results, and symptoms reviewed by M. Lucostic, NP. He educated the patient on holding lasix, potassium, and magnesium at this time. Patient reports she took lasix and magnesium this morning and held potassium. She reports she feels better this morning and denies any symptoms at this time. She verbalizes understanding of holding lasix, potassium, and magnesium. She was also educated to call with the development of any symptoms including fever, chills, shortness of breath, swelling, abdominal pain and go to her local emergency room. She verbalizes understanding.     Patient was instructed to contact the cardiac surgery office directly if any symptoms, questions, or concerns arise and we can provide them with appropriate resources. Please keep your appointment with your follow-up provider. If for some reason you need to cancel, be sure to reschedule as this is important for your care.      Frances Furbish, RN  01/12/2019, 10:35

## 2019-01-13 ENCOUNTER — Ambulatory Visit (HOSPITAL_COMMUNITY): Payer: Self-pay

## 2019-01-13 ENCOUNTER — Encounter (HOSPITAL_COMMUNITY): Payer: Self-pay

## 2019-01-13 DIAGNOSIS — Z952 Presence of prosthetic heart valve: Secondary | ICD-10-CM | POA: Insufficient documentation

## 2019-01-13 LAB — PT/INR: INR: 1.6

## 2019-01-13 NOTE — Progress Notes (Signed)
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  INR Range: 2.0-3.0  Tab strength: warfarin 2mg   Contact 1: [phone] cell  Contact 2:   Lab: [facility] [place]  Comments:    INR: 1.6  Current Dose: warfarin 4mg  daily since INR of 2.0 on WED  Clinical Outcomes     Positives:   INR Below Goal        Patient Findings     Positives:   Change in diet/appetite (pt ate green beans twice yesteryda)        Plan: Hardin called in the INR and was instructed for pt to take warfarin 8mg  tonight x 1 dose, then 4mg  daily with INR on Monday  Next INR Date: 01-16-2019    Trinna Post, RN  Specialty Care Nurse, [place]  [phone] (ext [phone]) or [phone] (ext [phone])  [email or web address]          For additional information, see dosing calendar below.    Warfarin Therapy Instructions   March 2020 Details    Sun Mon Tue Wed Thu Fri Sat     1               2               3               4               5               6               7                 8               9               10               11               12               13               14                 15               16               17               18               19               20               21                 22               23               24               25    2.0   4 mg   See details      26  4 mg         27   1.6   8 mg   See details      28      4 mg           29      4 mg         30               31                    Date Details   03/25 Last INR check   INR: 2.0      03/27 This INR check   INR: 1.6!       Date of next INR:  01/16/2019

## 2019-01-16 ENCOUNTER — Encounter (HOSPITAL_COMMUNITY): Payer: Self-pay

## 2019-01-16 ENCOUNTER — Ambulatory Visit (HOSPITAL_COMMUNITY): Payer: Self-pay | Admitting: THORACIC SURGERY CARDIOTHORACIC VASCULAR SURGERY

## 2019-01-16 ENCOUNTER — Telehealth (HOSPITAL_COMMUNITY): Payer: Self-pay | Admitting: THORACIC SURGERY CARDIOTHORACIC VASCULAR SURGERY

## 2019-01-16 LAB — PT/INR: INR: 2.2

## 2019-01-16 NOTE — Patient Instructions (Signed)
Patient states nausea has improved since she talked to Dalbert Batman, NP, but it is still there slightly.    She states that the nausea seems to come after eating.   Denies any emesis.   Last bowel movement 01/15/19.   Instructed patient to call in if nausea worsens or fails to improve anymore.  Patient verbalizes understanding.

## 2019-01-16 NOTE — Progress Notes (Signed)
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  INR Range: 2.0-3.0  Tab strength: warfarin 2mg   Contact 1: [phone] cell  Contact 2:   Lab: [facility] [place]  Comments:    INR: 2.2    Current Dose: Newer start to warfarin. No set dose as of this encounter.     Clinical Outcomes     Positives:   INR In Range (INR good. spoke with [name] from [facility] agency)          Plan: instructed for pt to take warfarin 6 mg M/ 4 mg Tu and recheck INR on Wednesday. Dosing verified by [facility] nurse.     Next INR Date: 01/18/19    Leone Brand, BSN RN  Specialty Care Nurse, Anticoagulation  [facility]  Ph. [phone]  (ext. [phone])  Fax [phone]  [email or web address]        For additional information, see dosing calendar below.    Warfarin Therapy Instructions   March 2020 Details    Sun Molli [place] Tue Wed Thu Fri Sat     1               2               3               4               5               6               7                 8               9               10               11               12               13               14                 15               16               17               18               19               20               21                 22               23               24               25               26                27  1.6   8 mg   See details      28      4 mg           29      4 mg         30   2.2   6 mg   See details      31      4 mg              Date Details   03/27 Last INR check   INR: 1.6!      03/30 This INR check   INR: 2.2              Warfarin Therapy Instructions   April 2020 Details    Sun Mon Tue Wed Thu Fri Sat        1               2               3               4                 5               6               7               8               9               10               11                 12               13               14               15               16               17                 18                 19               20               21               22               23               24               25                 26               27               28               29                30  Date Details   No additional details    Date of next INR:  01/18/2019

## 2019-01-16 NOTE — Telephone Encounter (Signed)
Regarding: patient of Dr Lacinda Axon: Requesting return phone call  ----- Message from [place] sent at 01/16/2019 11:57 AM EDT -----  Patient of Dr Lacinda Axon    Patient is calling in to give an update to [facility]. She states that she spoke with Rodman Key last week and was told to call back today. She is requesting a phone call back at [phone].     Thank You

## 2019-01-17 ENCOUNTER — Encounter (HOSPITAL_COMMUNITY): Payer: Self-pay | Admitting: Nurse Practitioner

## 2019-01-17 ENCOUNTER — Telehealth (HOSPITAL_COMMUNITY): Payer: Self-pay | Admitting: THORACIC SURGERY CARDIOTHORACIC VASCULAR SURGERY

## 2019-01-17 NOTE — Telephone Encounter (Signed)
Completed warfarin (Coumadin®) education and discussed "Blood Thinner Pills" booklet with patient. Told the patient to take warfarin at same time every evening. We discussed expectations for INR blood draws; medications, illnesses, and foods that can effect INR levels; and side effects/adverse events with taking warfarin.   Discussed foods that contain high amounts of vitamin K.  Told the patient to avoid liver and be consistent with the amount of green leafy vegetables eaten from week to week.  We also discussed alcohol use. Patient verbalized a good understanding.

## 2019-01-17 NOTE — Patient Instructions (Signed)
Patient report she is still having bad nausea without emesis or abdominal pain.  We had stopped her lasix, magnesium and potassium.  She is also on amiodarone.  We will stop the amiodarone now as well.  She still reports she is doing fantastic and denies any chest pain, dyspnea, edema, orthopnea, weight gain or palpitations.  She was advised if she develops any dyspnea, weight gain or edema or any palpitations, fast or irregular heart beat, chest pain or dyspnea to call us right away or go to local ED if emergent.  Patient verbalized understanding and is agreeable to plan.

## 2019-01-18 ENCOUNTER — Encounter (HOSPITAL_COMMUNITY): Payer: Self-pay

## 2019-01-18 ENCOUNTER — Encounter (FREE_STANDING_LABORATORY_FACILITY)
Admit: 2019-01-18 | Discharge: 2019-01-18 | Disposition: A | Discharge status: Home / Self Care | Payer: Medicaid Other | Attending: Family Medicine | Admitting: Family Medicine

## 2019-01-18 ENCOUNTER — Ambulatory Visit (HOSPITAL_COMMUNITY): Payer: Self-pay

## 2019-01-18 ENCOUNTER — Encounter (FREE_STANDING_LABORATORY_FACILITY): Payer: Medicaid Other | Admitting: Family Medicine

## 2019-01-18 DIAGNOSIS — Z48812 Encounter for surgical aftercare following surgery on the circulatory system: Secondary | ICD-10-CM

## 2019-01-18 LAB — CBC/DIFF - CLIENT CONSOLIDATED
BASOPHIL #: <0.1 10*3/uL (ref <=0.20)
BASOPHIL %: 1 %
EOSINOPHIL #: 0.19 10*3/uL (ref <=0.50)
EOSINOPHIL %: 3 %
HCT: 32.6 % — ABNORMAL LOW (ref 34.8–46.0)
HGB: 10.3 g/dL — ABNORMAL LOW (ref 11.5–16.0)
IMMATURE GRANULOCYTE #: <0.1 10*3/uL (ref <0.10)
IMMATURE GRANULOCYTE %: 0 % (ref 0–1)
LYMPHOCYTE #: 1.63 10*3/uL (ref 1.00–4.80)
LYMPHOCYTE %: 21 %
MCH: 29.3 pg (ref 26.0–32.0)
MCHC: 31.6 g/dL (ref 31.0–35.5)
MCV: 92.9 fL (ref 78.0–100.0)
MONOCYTE #: 0.46 10*3/uL (ref 0.20–1.10)
MONOCYTE %: 6 %
MPV: 8 fL — ABNORMAL LOW (ref 8.7–12.5)
NEUTROPHIL #: 5.26 10*3/uL (ref 1.50–7.70)
NEUTROPHIL %: 69 %
PLATELETS: 572 10*3/uL — ABNORMAL HIGH (ref 150–400)
RBC: 3.51 10*6/uL — ABNORMAL LOW (ref 3.85–5.22)
RDW-CV: 12.2 % (ref 11.5–15.5)
WBC: 7.6 10*3/uL (ref 3.7–11.0)

## 2019-01-18 LAB — CBC
HCT: 32.6
HGB: 10.3
PLATELET COUNT: 572
RBC: 3.51
WBC: 7.6

## 2019-01-18 LAB — CBC WITH DIFF
BASOPHIL #: <0.1 10*3/uL (ref <=0.20)
BASOPHIL %: 1 %
EOSINOPHIL #: 0.19 10*3/uL (ref <=0.50)
EOSINOPHIL %: 3 %
HCT: 32.6 % — ABNORMAL LOW (ref 34.8–46.0)
HGB: 10.3 g/dL — ABNORMAL LOW (ref 11.5–16.0)
IMMATURE GRANULOCYTE #: <0.1 10*3/uL (ref <0.10)
IMMATURE GRANULOCYTE %: 0 % (ref 0–1)
LYMPHOCYTE #: 1.63 10*3/uL (ref 1.00–4.80)
LYMPHOCYTE %: 21 %
MCH: 29.3 pg (ref 26.0–32.0)
MCHC: 31.6 g/dL (ref 31.0–35.5)
MCV: 92.9 fL (ref 78.0–100.0)
MONOCYTE #: 0.46 10*3/uL (ref 0.20–1.10)
MONOCYTE %: 6 %
MPV: 8 fL — ABNORMAL LOW (ref 8.7–12.5)
NEUTROPHIL #: 5.26 10*3/uL (ref 1.50–7.70)
NEUTROPHIL %: 69 %
PLATELETS: 572 10*3/uL — ABNORMAL HIGH (ref 150–400)
RBC: 3.51 10*6/uL — ABNORMAL LOW (ref 3.85–5.22)
RDW-CV: 12.2 % (ref 11.5–15.5)
WBC: 7.6 10*3/uL (ref 3.7–11.0)

## 2019-01-18 LAB — BASIC METABOLIC PANEL
ANION GAP: 6
ANION GAP: 6 mmol/L (ref 4–13)
BUN/CREA RATIO: 18 (ref 6–22)
BUN: 15
BUN: 15 mg/dL (ref 8–25)
CALCIUM: 9.1
CALCIUM: 9.1 mg/dL (ref 8.5–10.2)
CARBON DIOXIDE: 29
CHLORIDE: 100
CHLORIDE: 100 mmol/L (ref 96–111)
CO2 TOTAL: 29 mmol/L (ref 22–32)
CREATININE: 0.84
CREATININE: 0.84 mg/dL (ref 0.49–1.10)
ESTIMATED GFR: >60 mL/min/{1.73_m2} (ref >60)
GLUCOSE,NONFAST: 84
GLUCOSE: 84 mg/dL (ref 65–139)
POTASSIUM: 4.4
POTASSIUM: 4.4 mmol/L (ref 3.5–5.1)
SODIUM: 135
SODIUM: 135 mmol/L — ABNORMAL LOW (ref 136–145)

## 2019-01-18 LAB — PT/INR: INR: 3.2

## 2019-01-18 NOTE — Progress Notes (Signed)
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  INR Range: 2.0-3.0  Tab strength: warfarin 2mg   Contact 1: [phone] cell  Contact 2:   Lab: [facility] [place]  Comments:    INR: 3.2    Current Dose: Newer start to warfarin. No set dose as of this encounter.     Clinical Outcomes     Positives:   INR Above Goal (INR high. spoke with [name] from [facility] agency)          Plan: instructed for pt to take warfarin 2 mg W,Th and recheck INR on Friday. Dosing verified by [facility] nurse.     Next INR Date: 01/20/19    Leone Brand, BSN RN  Specialty Care Nurse, Anticoagulation  [facility]  Ph. [phone]  (ext. [phone])  Fax [phone]  [email or web address]        For additional information, see dosing calendar below.    Warfarin Therapy Instructions   March 2020 Details    Sun Molli Knock Tue Wed Thu Fri Sat     1               2               3               4               5               6               7                 8               9               10               11               12               13               14                 15               16               17               18               19               20               21                 22               23               24               25               26                27  28                 29               30    2.2   6 mg   See details      31      4 mg              Date Details   03/30 Last INR check   INR: 2.2              Warfarin Therapy Instructions   April 2020 Details    Sun Mon Tue Wed Thu Fri Sat        1   3.2   2 mg   See details      2      2 mg         3               4                 5               6               7               8               9               10               11                 12               13               14               15               16               17               18                 19                20               21               22               23               24               25                 26               27               28               29               30                   Date Details   04/01 This INR check   INR: 3.2!  Date of next INR:  01/20/2019

## 2019-01-20 ENCOUNTER — Encounter (HOSPITAL_COMMUNITY): Payer: Self-pay

## 2019-01-20 ENCOUNTER — Ambulatory Visit (HOSPITAL_COMMUNITY): Payer: Self-pay

## 2019-01-20 LAB — PT/INR: INR: 3.1

## 2019-01-20 NOTE — Progress Notes (Signed)
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  INR Range: 2.0-3.0  Tab strength: warfarin 2mg   Contact 1: [phone] cell  Contact 2:   Lab: [facility] [place]  Comments:    INR: 3.1    Current Dose: Newer start to warfarin. No set dose as of this encounter.     Clinical Outcomes     Positives:   INR Above Goal (INR elevated, but improving. spoke with [name] from [facility] agency)          Plan: instructed for pt to take warfarin 2 mg Fr,Su/ 4 mg Sa and recheck INR on Monday. Dosing verified by [facility] nurse.     Next INR Date: 01/23/19    Leone Brand, BSN RN  Specialty Care Nurse, Anticoagulation  [facility]  Ph. [phone]  (ext. [phone])  Fax [phone]  [email or web address]        For additional information, see dosing calendar below.    Warfarin Therapy Instructions   April 2020 Details    Sun Mon Tue Wed Thu Fri Sat        1   3.2   2 mg   See details      2      2 mg         3   3.1   2 mg   See details      4      4 mg           5      2 mg         6      0 mg         7               8               9               10               11                 12               13               14               15               16               17               18                 19               20               21               22               23               24               25                  26  27               28               29               30                   Date Details   04/01 Last INR check   INR: 3.2!      04/03 This INR check   INR: 3.1!       Date of next INR:  01/23/2019

## 2019-01-23 ENCOUNTER — Ambulatory Visit (HOSPITAL_COMMUNITY): Payer: Self-pay

## 2019-01-23 ENCOUNTER — Encounter (HOSPITAL_COMMUNITY): Payer: Self-pay

## 2019-01-23 LAB — PT/INR: INR: 2

## 2019-01-23 NOTE — Progress Notes (Signed)
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  INR Range: 2.0-3.0  Tab strength: warfarin 2mg   Contact 1: [phone] cell  Contact 2:   Lab: [facility] [place]  Comments:    INR: 2.0    Current Dose: Newer start to warfarin. No set dose as of this encounter.     Clinical Outcomes     Positives:   INR In Range (INR low normal. spoke with [name] from [facility] agency)          Plan: instructed for pt to take warfarin 4 mg M/ 2 mg Tu and recheck INR on Wed. Dosing verified by [facility] nurse.     Next INR Date: 01/25/19    Leone Brand, BSN RN  Specialty Care Nurse, Anticoagulation  [facility]  Ph. [phone]  (ext. [phone])  Fax [phone]  [email or web address]        For additional information, see dosing calendar below.    Warfarin Therapy Instructions   April 2020 Details    Sun Mon Tue Wed Thu Fri Sat        1               2               3    3.1   2 mg   See details      4      4 mg           5      2 mg         6   2.0   8 mg   See details      7      2 mg         8               9               10               11                 12               13               14               15               16               17               18                 19               20               21               22               23               24               25                  26  27               28               29               30                   Date Details   04/03 Last INR check   INR: 3.1!      04/06 This INR check   INR: 2.0       Date of next INR:  01/25/2019

## 2019-01-24 ENCOUNTER — Encounter (HOSPITAL_BASED_OUTPATIENT_CLINIC_OR_DEPARTMENT_OTHER): Payer: Self-pay | Admitting: Family

## 2019-01-24 NOTE — Nursing Note (Signed)
Entered patient chart to document lab results from outside source. Donnal Moat, [place]  01/24/2019, 14:15

## 2019-01-25 ENCOUNTER — Encounter (HOSPITAL_COMMUNITY): Payer: Self-pay

## 2019-01-25 ENCOUNTER — Encounter (FREE_STANDING_LABORATORY_FACILITY): Payer: Medicaid Other | Admitting: Specialist

## 2019-01-25 ENCOUNTER — Encounter (FREE_STANDING_LABORATORY_FACILITY)
Admit: 2019-01-25 | Discharge: 2019-01-25 | Disposition: A | Discharge status: Home / Self Care | Payer: Medicaid Other | Attending: Specialist | Admitting: Specialist

## 2019-01-25 ENCOUNTER — Encounter (HOSPITAL_BASED_OUTPATIENT_CLINIC_OR_DEPARTMENT_OTHER): Payer: Self-pay | Admitting: Social Worker

## 2019-01-25 ENCOUNTER — Ambulatory Visit (HOSPITAL_COMMUNITY): Payer: Self-pay

## 2019-01-25 DIAGNOSIS — R7989 Other specified abnormal findings of blood chemistry: Secondary | ICD-10-CM

## 2019-01-25 DIAGNOSIS — R6889 Other general symptoms and signs: Secondary | ICD-10-CM | POA: Insufficient documentation

## 2019-01-25 LAB — CBC WITH DIFF
BASOPHIL #: <0.1 10*3/uL (ref <=0.20)
BASOPHIL %: 1 %
EOSINOPHIL #: 0.23 10*3/uL (ref <=0.50)
EOSINOPHIL %: 3 %
HCT: 34.6 % — ABNORMAL LOW (ref 34.8–46.0)
HGB: 10.9 g/dL — ABNORMAL LOW (ref 11.5–16.0)
IMMATURE GRANULOCYTE #: <0.1 10*3/uL (ref <0.10)
IMMATURE GRANULOCYTE %: 0 % (ref 0–1)
LYMPHOCYTE #: 2.24 10*3/uL (ref 1.00–4.80)
LYMPHOCYTE %: 31 %
MCH: 29.6 pg (ref 26.0–32.0)
MCHC: 31.5 g/dL (ref 31.0–35.5)
MCV: 94 fL (ref 78.0–100.0)
MONOCYTE #: 0.52 10*3/uL (ref 0.20–1.10)
MONOCYTE %: 7 %
MPV: 8.2 fL — ABNORMAL LOW (ref 8.7–12.5)
NEUTROPHIL #: 4.27 10*3/uL (ref 1.50–7.70)
NEUTROPHIL %: 58 %
NEUTROPHIL %: 58 %
PLATELETS: 481 10*3/uL — ABNORMAL HIGH (ref 150–400)
RBC: 3.68 10*6/uL — ABNORMAL LOW (ref 3.85–5.22)
RDW-CV: 12.3 % (ref 11.5–15.5)
WBC: 7.3 10*3/uL (ref 3.7–11.0)

## 2019-01-25 LAB — CBC/DIFF - CLIENT CONSOLIDATED
BASOPHIL #: <0.1 10*3/uL (ref <=0.20)
BASOPHIL %: 1 %
EOSINOPHIL #: 0.23 10*3/uL (ref <=0.50)
EOSINOPHIL %: 3 %
HCT: 34.6 % — ABNORMAL LOW (ref 34.8–46.0)
HGB: 10.9 g/dL — ABNORMAL LOW (ref 11.5–16.0)
IMMATURE GRANULOCYTE #: <0.1 10*3/uL (ref <0.10)
IMMATURE GRANULOCYTE %: 0 % (ref 0–1)
LYMPHOCYTE #: 2.24 10*3/uL (ref 1.00–4.80)
LYMPHOCYTE %: 31 %
MCH: 29.6 pg (ref 26.0–32.0)
MCHC: 31.5 g/dL (ref 31.0–35.5)
MCV: 94 fL (ref 78.0–100.0)
MONOCYTE #: 0.52 10*3/uL (ref 0.20–1.10)
MONOCYTE %: 7 %
MPV: 8.2 fL — ABNORMAL LOW (ref 8.7–12.5)
NEUTROPHIL #: 4.27 10*3/uL (ref 1.50–7.70)
NEUTROPHIL %: 58 %
PLATELETS: 481 10*3/uL — ABNORMAL HIGH (ref 150–400)
RBC: 3.68 10*6/uL — ABNORMAL LOW (ref 3.85–5.22)
RDW-CV: 12.3 % (ref 11.5–15.5)
WBC: 7.3 10*3/uL (ref 3.7–11.0)

## 2019-01-25 LAB — BASIC METABOLIC PANEL
ANION GAP: 4 mmol/L (ref 4–13)
BUN/CREA RATIO: 21 (ref 6–22)
BUN: 17 mg/dL (ref 8–25)
CALCIUM: 9 mg/dL (ref 8.5–10.2)
CHLORIDE: 102 mmol/L (ref 96–111)
CO2 TOTAL: 29 mmol/L (ref 22–32)
CREATININE: 0.82 mg/dL (ref 0.49–1.10)
ESTIMATED GFR: >60 mL/min/{1.73_m2} (ref >60)
GLUCOSE: 87 mg/dL (ref 65–139)
POTASSIUM: 4.1 mmol/L (ref 3.5–5.1)
SODIUM: 135 mmol/L — ABNORMAL LOW (ref 136–145)

## 2019-01-25 LAB — PT/INR: INR: 1.6

## 2019-01-25 NOTE — Progress Notes (Signed)
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  INR Range: 2.0-3.0  Tab strength: warfarin 2mg   Contact 1: [phone] cell  Contact 2:   Lab: [facility] [place]  Comments:    INR: 1.6    Current Dose: Newer start to warfarin. No set dose as of this encounter.     Clinical Outcomes     Positives:   INR Below Goal (INR low. spoke with Calla from [facility] agency)        Patient Findings     Negatives:   Change in health (pt denies changes), Change in medications (pt denies changes), Change in diet/appetite (pt denies changes)        Plan: instructed for pt to take warfarin 8 mg W/ 6 mg Th and recheck INR on Friday. Dosing verified by [facility] nurse.     Next INR Date: 01/27/19    Leone Brand, BSN RN  Specialty Care Nurse, Anticoagulation  [facility]  Ph. [phone]  (ext. [phone])  Fax [phone]  [email or web address]        For additional information, see dosing calendar below.    Warfarin Therapy Instructions   April 2020 Details    Sun Mon Tue Wed Thu Fri Sat        1               2               3               4                 5               6    2.0   8 mg   See details      7      2 mg         8   1.6   8 mg   See details      9      6 mg         10               11                 12               13               14               15               16               17               18                 19               20               21               22               23                24  25                 26               27               28               29               30                   Date Details   04/06 Last INR check   INR: 2.0      04/08 This INR check   INR: 1.6!       Date of next INR:  01/27/2019

## 2019-01-25 NOTE — Progress Notes (Addendum)
MSW received the following orders from [facility]):    [place] dated [phone] to [phone] (order [id number])  Physician Order dated [phone] (order [id number])  Physician Order dated [phone] (order [id number])  Physician Order dated [phone] (order [id number])  Physician Order dated [phone] (order [id number])    MSW sent via campus mail to Dr. Henderson Baltimore to review, sign and date. This Certification and Plan of Care can be used for billing purposes and will be sent to billing upon completion. MSW will continue to follow as needed.    [name] was provided with information about the following community resources:  Home Care:  [place]

## 2019-01-26 ENCOUNTER — Telehealth (HOSPITAL_COMMUNITY): Payer: Self-pay | Admitting: THORACIC SURGERY CARDIOTHORACIC VASCULAR SURGERY

## 2019-01-26 ENCOUNTER — Ambulatory Visit (HOSPITAL_BASED_OUTPATIENT_CLINIC_OR_DEPARTMENT_OTHER): Payer: Self-pay | Admitting: Family Medicine

## 2019-01-26 NOTE — Telephone Encounter (Signed)
Attempted follow up phone call. Left voicemail message to call with any questions or concerns.

## 2019-01-26 NOTE — Telephone Encounter (Addendum)
Can you please sign the plan of care for home health for [place].  The plan of care originated while in the hospital.  she is Raquel Sarna Obertance's patient. Medicare requires a physician to sign the plan of care. Kerman Passey, RN            Regarding: Britta Mccreedy PT//Order issues  ----- Message from Abelardo Diesel sent at 01/26/2019 11:25 AM EDT -----  Derek Jack, APRN,FNP-BC    Tom, from [facility], calling and states that the plan of care that was sent needs to be co-signed by MD because of Medicare.    FAX:  [phone]    Thanks,    Benedetto Coons [facility]

## 2019-01-26 NOTE — Nursing Note (Signed)
Order faxed again, Bozek signed it yesterday Odis Hollingshead, LPN

## 2019-01-27 ENCOUNTER — Ambulatory Visit (HOSPITAL_COMMUNITY): Payer: Self-pay

## 2019-01-27 ENCOUNTER — Encounter (HOSPITAL_COMMUNITY): Payer: Self-pay

## 2019-01-27 LAB — PT/INR: INR: 2

## 2019-01-27 NOTE — Progress Notes (Signed)
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  INR Range: 2.0-3.0  Tab strength: warfarin 2mg   Contact 1: [phone] cell  Contact 2:   Lab: [facility] [place]  Comments:    INR: 2.0    Current Dose: Newer start to warfarin. No set dose as of this encounter.     Clinical Outcomes     Positives:   INR In Range (INR low normal. spoke wtih [name] from [facility] agency)          Plan: instructed for pt to take warfarin 8 mg Fr/ 6 mg Sa,Su and recheck INR on Monday. Dosing verified by [facility] nurse.     Next INR Date: 01/30/19    Leone Brand, BSN RN  Specialty Care Nurse, Anticoagulation  [facility]  Ph. [phone]  (ext. [phone])  Fax [phone]  [email or web address]        For additional information, see dosing calendar below.    Warfarin Therapy Instructions   April 2020 Details    Sun Mon Tue Wed Thu Fri Sat        1               2               3               4                 5               6               7               8    1.6   8 mg   See details      9      6 mg         10   2.0   8 mg   See details      11      6 mg           12      6 mg         13               14               15               16               17               18                 19               20               21               22               23               24               25                  26  27               28               29               30                   Date Details   04/08 Last INR check   INR: 1.6!      04/10 This INR check   INR: 2.0       Date of next INR:  01/30/2019

## 2019-01-30 ENCOUNTER — Ambulatory Visit (HOSPITAL_COMMUNITY): Payer: Self-pay

## 2019-01-30 ENCOUNTER — Encounter (HOSPITAL_COMMUNITY): Payer: Self-pay

## 2019-01-30 LAB — PT/INR
INR: 3.4
INR: 3.4

## 2019-01-30 NOTE — Progress Notes (Signed)
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  INR Range: 2.0-3.0  Tab strength: warfarin 2mg   Contact 1: [phone] cell  Contact 2:   Lab: [facility]  Comments:    INR: 3.4    Current Dose: Newer start to warfarin. No set dose as of this encounter.     Clinical Outcomes     Positives:   INR Above Goal (INR high. spoke with [name] from [facility] agency)          Plan: instructed for pt to take warfarin 2 mg Mo/ 6 mg all other days and recheck INR on Friday. Dosing verified by [facility] nurse.     Next INR Date: 02/03/19    [name], BSN RN  Specialty Care Nurse, Anticoagulation  [facility]  Ph. [phone]  (ext. [phone])  Fax [phone]  [email or web address]        For additional information, see dosing calendar below.    Warfarin Therapy Instructions   April 2020 Details    Sun Mon Tue Wed Thu Fri Sat        1               2               3               4                 5               6               7               8               9               10    2.0   8 mg   See details      11      6 mg           12      6 mg         13   3.4   2 mg   See details      14      6 mg         15      6 mg         16      6 mg         17               18                 19               20               21               22               23               24               25                  26  27               28               29               30                   Date Details   04/10 Last INR check   INR: 2.0      04/13 This INR check   INR: 3.4!       Date of next INR:  02/03/2019

## 2019-01-31 ENCOUNTER — Encounter (HOSPITAL_BASED_OUTPATIENT_CLINIC_OR_DEPARTMENT_OTHER): Payer: Self-pay | Admitting: Social Worker

## 2019-01-31 NOTE — Progress Notes (Signed)
MSW received signed [place] orders back from Dr. Henderson [place]:    Home Health Certification and Plan of Care dated [phone] to [phone] (order [id number])  Physician Order dated [phone] (order [id number])  Physician Order dated [id number] (order [id number])  Physician Order dated [phone] (order [id number])  Physician Order dated [phone] (order [id number])    MSW faxed to [facility]) on this date. MSW will have orders scanned into patient's medical record at this time. MSW will continue to follow as needed.    [name] was provided with information about the following community resources:  Home Care:  [place]

## 2019-02-01 ENCOUNTER — Telehealth (HOSPITAL_COMMUNITY): Payer: Self-pay | Admitting: Family

## 2019-02-01 NOTE — Telephone Encounter (Signed)
Patient Name: [name]   Date of birth: 05/18/1956  Primary Care Provider: Derek Jack, APRN,FNP-BC   Date: 02/01/2019    Phoned patient to discuss [name]'s upcoming appointment.  In light of the COVID-19 pandemic, we are screening patients who are coming to see [place].  The patient did not answer call at this time. LVM with direct callback number for patient to return my call at her earliest convenience.  Berenice Bouton, APRN  02/01/2019, 12:54

## 2019-02-02 ENCOUNTER — Ambulatory Visit (HOSPITAL_BASED_OUTPATIENT_CLINIC_OR_DEPARTMENT_OTHER)
Admission: RE | Admit: 2019-02-02 | Discharge: 2019-02-02 | Disposition: A | Discharge status: Home / Self Care | Payer: 59 | Source: Ambulatory Visit

## 2019-02-02 ENCOUNTER — Ambulatory Visit (HOSPITAL_BASED_OUTPATIENT_CLINIC_OR_DEPARTMENT_OTHER): Payer: 59 | Admitting: Family

## 2019-02-02 ENCOUNTER — Ambulatory Visit
Admission: RE | Admit: 2019-02-02 | Discharge: 2019-02-02 | Disposition: A | Discharge status: Home / Self Care | Payer: 59 | Source: Ambulatory Visit | Attending: Family | Admitting: Family

## 2019-02-02 ENCOUNTER — Other Ambulatory Visit: Payer: Self-pay

## 2019-02-02 ENCOUNTER — Encounter (HOSPITAL_COMMUNITY): Payer: Self-pay | Admitting: THORACIC SURGERY CARDIOTHORACIC VASCULAR SURGERY

## 2019-02-02 ENCOUNTER — Encounter (HOSPITAL_COMMUNITY): Payer: Self-pay

## 2019-02-02 ENCOUNTER — Ambulatory Visit (HOSPITAL_BASED_OUTPATIENT_CLINIC_OR_DEPARTMENT_OTHER): Payer: 59 | Admitting: THORACIC SURGERY CARDIOTHORACIC VASCULAR SURGERY

## 2019-02-02 VITALS — BP 130/85 | HR 75 | Temp 98.6°F | Resp 18 | Ht 65.0 in | Wt 212.5 lb

## 2019-02-02 DIAGNOSIS — Z952 Presence of prosthetic heart valve: Principal | ICD-10-CM

## 2019-02-02 DIAGNOSIS — I10 Essential (primary) hypertension: Secondary | ICD-10-CM

## 2019-02-02 DIAGNOSIS — Z79899 Other long term (current) drug therapy: Secondary | ICD-10-CM | POA: Insufficient documentation

## 2019-02-02 DIAGNOSIS — Z9889 Other specified postprocedural states: Secondary | ICD-10-CM

## 2019-02-02 DIAGNOSIS — I350 Nonrheumatic aortic (valve) stenosis: Secondary | ICD-10-CM

## 2019-02-02 DIAGNOSIS — I517 Cardiomegaly: Secondary | ICD-10-CM

## 2019-02-02 NOTE — Progress Notes (Signed)
Care Team:     Primary Care Providers:  Derek Jack, APRN,FNP-BC (General)    Referring Provider:  Joaquin Bend    Subjective:     [name] is a 63 year old caucasian female who is S/P minimally invasive aortic valve replacement via upper partial sternotomy with a 21 mm On-X mechanical valve on 01/04/19.  Her post-operative course was complicated by atrial fibrillation with successful conversion to NSR with amiodarone.  She was sent home on our oral 3 week taper.  She has been following with out anticoagulation team for her coumadin care.  She has been compliant and tolerating well.  Today she is here for his routine post-operative follow up visit.  She reports increased strength and physical endurance.  She reports normal appetite, mood and bowel pattern.  She denies any chest pain, dyspnea, dizziness, lightheadedness, palpitations, syncope or lower leg edema.  She denies any fevers, chills, malaise, sternal clicking or wound drainage.        Patient Active Problem List    Diagnosis Date Noted   . S/P AVR (aortic valve replacement) 01/13/2019   . S/P AVR 01/10/2019   . Aortic stenosis 12/01/2018   . Severe aortic stenosis 11/08/2018   . Low back pain radiating to left leg 12/02/2016   . S/P hip replacement 08/13/2016   . Essential hypertension 02/12/2016     Past Medical History:   Diagnosis Date   . 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 Aortic stenosis    . Arthritis    . Blood in stool     positive cologuard.  Has not yet scheduled colonoscopy   . Cancer (CMS HCC)     skin cancer L lip.  Needs MOHS to be scheduled   . Chronic pain    . Colon polyp    . Dyspnea on exertion     with 2 flights   . Exercise intolerance    . Heart murmur     echo 02/2016, aortic stenosis   . Heartburn    . High blood pressure    . Motion sickness    . Nausea with vomiting    . Obesity    . Rash     scalp psorriais and involves R ear   . Valvular disease     aortic valve stenosis,  following with CT surgery, echo 02/2016 and cath 03/2016   . Wears glasses           Past Surgical History:   Procedure Laterality Date   . HX CESAREAN SECTION  1984   . HX HEART CATHETERIZATION      x2 with no intervention per pt report   . HX HERNIA REPAIR  2004, 2006, 2008    x3   . HX HIP REPLACEMENT Left 08/11/2016   . HX HYSTERECTOMY  2003            Allergies   Allergen Reactions   . Iv Contrast Anaphylaxis   . Oregano      Hives     . Stadol [Butorphanol Tartrate] Mental Status Effect   . Tomato Hives/ Urticaria      Social History     Tobacco Use   . Smoking status: Former Smoker     Packs/day: 1.00     Years: 35.00     Pack years: 35.00     Types: Cigarettes     Last attempt to quit:  2014     Years since quitting: 6.2   . Smokeless tobacco: Never Used   Substance Use Topics   . Alcohol use: Yes     Frequency: Monthly or less     Comment: social only      Family Medical History:     Problem Relation (Age of Onset)    Atrial fibrillation Sister    Heart Disease Mother    Hypertension (High Blood Pressure) Father    Lung Cancer Father, Brother    Mitral valve prolapse Sister    Sudden Death no cause Mother             Current Outpatient Medications:   .  acetaminophen (TYLENOL) 500 mg Oral Tablet, Take 500 mg by mouth Every 4 hours as needed for Pain, Disp: , Rfl:   .  amiodarone (PACERONE) 200 mg Oral Tablet, Take 2 Tabs (400 mg total) by mouth Twice daily for 14 doses (Patient not taking: Reported on 02/02/2019), Disp: 28 Tab, Rfl: 0  .  amiodarone (PACERONE) 200 mg Oral Tablet, Take 2 Tabs (400 mg total) by mouth Once a day for 7 doses (Patient not taking: Reported on 02/02/2019), Disp: 14 Tab, Rfl: 0  .  amiodarone (PACERONE) 200 mg Oral Tablet, Take 1 Tab (200 mg total) by mouth Once a day for 7 doses (Patient not taking: Reported on 02/02/2019), Disp: 7 Tab, Rfl: 0  .  aspirin 81 mg Oral Tablet, Chewable, Take 1 Tab (81 mg total) by mouth Once a day, Disp: 30 Tab, Rfl: 3  .  clobetasoL (CORMAX) 0.05 %  Solution, by Apply Topically route Twice daily, Disp: 1 Bottle, Rfl: 1  .  clobetasoL (TEMOVATE) 0.05 % Cream, by Apply Topically route Twice daily, Disp: 1 Tube, Rfl: 1  .  fluticasone propionate (FLONASE) 50 mcg/actuation Nasal Spray, Suspension, 1 Spray by Each Nostril route Once a day, Disp: 3 Bottle, Rfl: 0  .  loratadine (CLARITIN) 10 mg Oral Tablet, Take 10 mg by mouth Once a day, Disp: , Rfl:   .  metoprolol tartrate (LOPRESSOR) 50 mg Oral Tablet, Take 1 Tab (50 mg total) by mouth Twice daily, Disp: 60 Tab, Rfl: 2  .  multivitamin Oral Tablet, Take 1 Tab by mouth Once a day, Disp: , Rfl:   .  warfarin (COUMADIN) 2 mg Oral Tablet, Take 2 Tabs (4 mg total) by mouth Every evening, Disp: 60 Tab, Rfl: 2      Objective:     BP 130/85   Pulse 75   Temp 37 C (98.6 F)   Resp 18   Ht 1.651 m (5\' 5" )   Wt 96.4 kg (212 lb 8.4 oz)   SpO2 94%   BMI 35.37 kg/m       Physical Exam:  General appearance: Appears healthy.  Alert; in no acute distress.  Pleasant.  Cardiac: PMI normal. No lifts, heaves, or thrills. RRR. No murmurs, clicks, gallops, or rubs    Sternum stable   Respiratory: Respiratory effort normal and clear to auscultation  Abdomen: soft, non-tender. Bowel sounds normal. No masses,  no organomegaly  Extremities: no cyanosis, clubbing or edema present    Surgical Incisions:   Chest: well healing, without drainage      Data Review    Chest xray:     Reviewed by Dr. Lacinda Axon and revealed no acute cardiopulmonary process      EKG: NSR heart rate 70      TTE reviewed by  Dr. Lacinda Axon and revealed normal EF, mean gradient across AV of 12 and no pericardial effusion.      Assessment:     [name] is a 63 year old caucasian female who is S/P minimally invasive aortic valve replacement via upper partial sternotomy with a 21 mm On-X mechanical valve on 01/04/19.  Her post-operative course was complicated by atrial fibrillation with successful conversion to NSR with amiodarone.  She was sent home on our oral 3  week taper.  She has been following with out anticoagulation team for her coumadin care.  She has been compliant and tolerating well.  Today she is here for his routine post-operative follow up visit.  Post-op testing and imaging all reviewed by Dr. Lacinda Axon.  She is NYHA Class I.      Plan:     1.  She can now drive.  2.  She can start cardiac rehab.  3.  She is to follow up with her PCP and cardiologist (seen today) for routine post-operative visit and further management.  4.  Continue with our anticoagulation team for coumadin care.  5.  Follow up as needed.      Dalbert Batman, APRN 02/02/2019, 15:28

## 2019-02-02 NOTE — Progress Notes (Signed)
[place]  [place] [address]  Phone: [phone]  Fax: [phone]    Encounter Date: 02/02/2019    Patient ID:  [name]  WNU:[id number]    DOB: 1956/01/05  Age: 63 y.o. female     Chief Complaint   Patient presents with   . Hospital Follow Up     aortic valve stenosis     Subjective:   HPI: [name] is a pleasant 63 year old female presenting to clinic today for a three week follow up status post aortic valve replacement. On 01/04/2019, patient underwent a minimally invasive aortic valve replacement via upper partial sternotomy approach using a 21 mm On-X mechanical valve. Patient did develop atrial fibrillation on postop day 6 which was subsequently managed with an amiodarone gtt, resulting in successful conversion to sinus rhythm, and oral taper upon discharge. The remainder of her hospital stay was uneventful and she was deemed suitable for home discharge on 01/10/2019. She was initiated on coumadin therapy, which she continues to be compliant with and [facility]. From a symptom standpoint, patient reports doing very well overall. She has increased her activity gradually and notices that she can ambulate increased distances without experiencing exertional fatigue or dyspnea. She denies any chest pain or pressure, dyspnea at rest or with exertion, orthopnea, lightheadedness, syncope, or edema. The remainder of her history is significant for hypertension, emphysema, former smoker, arthritis, ascending aortic aneurysm being followed by cardiothoracic surgery, and s/p total hip arthroplasty. Patient underwent a diagnostic cardiac catheterization 12/06/2018 prior to undergoing valve replacement which revealed normal coronary arteries.   Current Outpatient Medications   Medication Sig   . acetaminophen (TYLENOL) 500 mg Oral Tablet Take 500 mg by mouth Every 4 hours as needed for Pain   .  amiodarone (PACERONE) 200 mg Oral Tablet Take 2 Tabs (400 mg total) by mouth Twice daily for 14 doses   . amiodarone (PACERONE) 200 mg Oral Tablet Take 2 Tabs (400 mg total) by mouth Once a day for 7 doses   . amiodarone (PACERONE) 200 mg Oral Tablet Take 1 Tab (200 mg total) by mouth Once a day for 7 doses   . aspirin 81 mg Oral Tablet, Chewable Take 1 Tab (81 mg total) by mouth Once a day   . clobetasoL (CORMAX) 0.05 % Solution by Apply Topically route Twice daily   . clobetasoL (TEMOVATE) 0.05 % Cream by Apply Topically route Twice daily   . fluticasone propionate (FLONASE) 50 mcg/actuation Nasal Spray, Suspension 1 Spray by Each Nostril route Once a day   . loratadine (CLARITIN) 10 mg Oral Tablet Take 10 mg by mouth Once a day   . metoprolol tartrate (LOPRESSOR) 50 mg Oral Tablet Take 1 Tab (50 mg total) by mouth Twice daily   . multivitamin Oral Tablet Take 1 Tab by mouth Once a day   . warfarin (COUMADIN) 2 mg Oral Tablet Take 2 Tabs (4 mg total) by mouth Every evening     Allergies   Allergen Reactions   . Iv Contrast Anaphylaxis   . Oregano      Hives     . Stadol [Butorphanol Tartrate] Mental Status Effect   . Tomato Hives/ Urticaria     Past Medical History:   Diagnosis Date   . 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 Aortic stenosis    .  Arthritis    . Blood in stool     positive cologuard.  Has not yet scheduled colonoscopy   . Cancer (CMS HCC)     skin cancer L lip.  Needs MOHS to be scheduled   . Chronic pain    . Colon polyp    . Dyspnea on exertion     with 2 flights   . Exercise intolerance    . Heart murmur     echo 02/2016, aortic stenosis   . Heartburn    . High blood pressure    . Motion sickness    . Nausea with vomiting    . Obesity    . Rash     scalp psorriais and involves R ear   . Valvular disease     aortic valve stenosis, following with CT surgery, echo 02/2016 and cath 03/2016   . Wears glasses          Past Surgical History:   Procedure Laterality Date   . HX CESAREAN  SECTION  1984   . HX HEART CATHETERIZATION      x2 with no intervention per pt report   . HX HERNIA REPAIR  2004, 2006, 2008    x3   . HX HIP REPLACEMENT Left 08/11/2016   . HX HYSTERECTOMY  2003         Family Medical History:     Problem Relation (Age of Onset)    Atrial fibrillation Sister    Heart Disease Mother    Hypertension (High Blood Pressure) Father    Lung Cancer Father, Brother    Mitral valve prolapse Sister    Sudden Death no cause Mother            Social History     Tobacco Use   . Smoking status: Former Smoker     Packs/day: 1.00     Years: 35.00     Pack years: 35.00     Types: Cigarettes     Last attempt to quit: 2014     Years since quitting: 6.2   . Smokeless tobacco: Never Used   Substance Use Topics   . Alcohol use: Yes     Frequency: Monthly or less     Comment: social only   . Drug use: No     Comment: CBD oil 61m edible       Review of Systems   Constitutional: Negative for activity change, appetite change, diaphoresis, fatigue and fever.   HENT: Negative.    Respiratory: Negative for cough, chest tightness, shortness of breath and wheezing.    Cardiovascular: Negative for chest pain, palpitations and leg swelling.   Musculoskeletal: Positive for arthralgias.   Skin: Negative.    Neurological: Negative for dizziness, tremors, syncope, weakness, light-headedness and headaches.   Hematological: Bruises/bleeds easily (easy bruising).   Psychiatric/Behavioral: Negative.      Objective:   Vitals:   Vitals:    02/02/19 1514   BP: 130/85   Pulse: 75   Resp: 18   Temp: 37 C (98.6 F)   SpO2: 94%   Weight: 96.4 kg (212 lb 8.4 oz)   Height: 1.651 m (5' 5")   BMI: 35.44     Physical Exam   Constitutional: Middle aged female appearing in good health overall with no acute distress. Vital signs reviewed.   Neck: Supple. Trachea midline.   Respiratory: Clear to auscultation bilaterally. No wheezes, rhonchi, or rales. Chest expansion symmetric.   Cardiovascular: HRR  regular. S1, S2 normal. Mechanical  valve click crisp. No significant murmurs, gallops, or rubs. No pedal edema.   Gastrointestinal: Abdomen soft, non-tender.   Musculoskeletal: AROM. Gait steady without assistive device use.   Integumentary: Warm, dry. No rashes or lesions visibly present.   Neuro: A&Ox4.   Psych: Calm, cooperative. Behavior appropriate to situation.     Assessment & Plan:     Patient Active Problem List   Diagnosis   . Essential hypertension   . S/P hip replacement   . Low back pain radiating to left leg   . Severe aortic stenosis   . Aortic stenosis   . S/P AVR   . S/P AVR (aortic valve replacement)     Severe Aortic Stenosis s/p AVR  - Underwent successful minimally invasive AVR via upper partial sternotomy using 21 mm On-X mechanical valve on 01/04/2019. Continues to be compliant with coumadin therapy and follows with [facility] anticoagulation clinic. Imaging completed today including CXR and TTE reviewed and interpreted directly per cardiac surgery team, as patient was also evaluated in their clinic today as well. Sx consistent with NYHA I. Cleared from a cardiac surgery standpoint to drive and resume baseline activities. Plan for six month follow up with cardiology.     Postoperative Atrial Fibrillation  - Completed oral amiodarone taper x3 weeks. No documented recurrence. On thromboembolic prophylaxis with coumadin for s/p AVR as noted above.     Hypertension  - Appears relatively well controlled. Goal BP <130/80. Continue home antihypertensive regimen with lopressor with no changes made today.       No orders of the defined types were placed in this encounter.    The patient was seen independently with supervising physician not present but available for consultation.  Berenice Bouton, APRN  02/02/2019, 15:46    Patient was independently seen by Lovenia Shuck, NP. I was available for consultation and I reviewed patient's echocardiogram and agree with the findings and recommendations made by [facility].      [name],  MD

## 2019-02-02 NOTE — Patient Instructions (Addendum)
Cardiac Surgery  [address], [address]  [place], [place] [address]  Phone / [phone] ([facility] Call Center)  Fax / [phone]    Postoperative Follow-up    Invasive Procedure Prophylaxis:  Today you will receive your prevention of infective bacterial endocarditis card. You are at an increased risk for developing adverse outcomes from infective endocarditis, or bacterial endocarditis. It gives details for the antibiotic prophylactic regimens for dental and other invasive procedures. Please keep this on your person and discuss your cardiac history with your provider prior to any invasive procedure.    Patient verbalized understanding of instructions.      Medication Adjustments:    None    Follow up:  Establish care or follow up with your Cardiologist and PCP within a month after today. They will be managing your medications and medication adjustments moving forward.    You do not need to follow with Cardiac Surgery again. If any questions arise, we are happy to assist you.  Changes in Activity: You are able to start driving and increase your activity level at approximately 5 to 10 pounds per month. Three months after your surgery you should be fully healed and not have any more restrictions.  Cardiac Rehab: You are now able to start cardiac rehab. Cardiac rehab will contact you after this visit to establish services (if they haven't already). If you need to get in contact with [facility], the number is: [phone].

## 2019-02-03 ENCOUNTER — Encounter (HOSPITAL_COMMUNITY): Payer: Self-pay

## 2019-02-03 ENCOUNTER — Ambulatory Visit (HOSPITAL_COMMUNITY): Payer: Self-pay

## 2019-02-03 LAB — ECG 12-LEAD
Atrial Rate: 70 BPM
Atrial Rate: 70 {beats}/min
Calculated P Axis: 43 degrees
Calculated R Axis: 27 degrees
Calculated T Axis: 64 degrees
PR Interval: 152 ms
QRS Duration: 86 ms
QT Interval: 418 ms
QTC Calculation: 451 ms
Ventricular rate: 70 {beats}/min

## 2019-02-03 LAB — PT/INR: INR: 2.8

## 2019-02-03 NOTE — Progress Notes (Signed)
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  INR Range: 2.0-3.0  Tab strength: warfarin 2mg   Contact 1: [phone] cell  Contact 2:   Lab: [facility]  Comments:    INR: 2.8    Current Dose: Newer start to warfarin. No set dose as of this encounter.     Clinical Outcomes     Positives:   INR In Range (INR good. spoke with [name] from [facility] agency.)          Plan: pt to take warfarin 6 mg daily and recheck INR on Monday. Dosing verified by [facility] nurse    Next INR Date: 02/06/19    Leone Brand, BSN RN  Specialty Care Nurse, Anticoagulation  [facility]  Ph. [phone]  (ext. [phone])  Fax [phone]  [email or web address]        For additional information, see dosing calendar below.    Warfarin Therapy Instructions   April 2020 Details    Sun Mon Tue Wed Thu Fri Sat        1               2               3               4                 5               6               7               8               9               10               11                 12               13    3.4   2 mg   See details      14      6 mg         15      6 mg         16      6 mg         17   2.8   6 mg   See details      18      6 mg           19      6 mg         20               21               22               23               24               25                  26  27               28               29               30                   Date Details   04/13 Last INR check   INR: 3.4!      04/17 This INR check   INR: 2.8       Date of next INR:  02/06/2019

## 2019-02-06 ENCOUNTER — Encounter (HOSPITAL_COMMUNITY): Payer: Self-pay

## 2019-02-06 ENCOUNTER — Ambulatory Visit (HOSPITAL_COMMUNITY): Payer: Self-pay

## 2019-02-06 LAB — PT/INR: INR: 3.2

## 2019-02-06 NOTE — Progress Notes (Signed)
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  INR Range: 2.0-3.0  Tab strength: warfarin 2mg   Contact 1: [phone] cell  Contact 2:   Lab: [facility]  Comments:    INR: 3.2  Current Dose: warfarin 6mg  QD      Plan: [place] nurse was instructed for pt to take warfarin 4mg  tonight/ 6mg  TUES/ 4mg  WED with INR on Thursday  Next INR Date: 02-09-2019    Trinna Post, RN  Specialty Care Nurse, [place]  [phone] (ext [phone]) or [phone] (ext [phone])  [email or web address]          For additional information, see dosing calendar below.    Warfarin Therapy Instructions   April 2020 Details    Sun Mon Tue Wed Thu Fri Sat        1               2               3               4                 5               6               7               8               9               10               11                 12               13               14               15               16               17    2.8   6 mg   See details      18      6 mg           19      6 mg         20   3.2   4 mg   See details      21      6 mg         22      4 mg         23               24               25                 26               27               28                29  30                  Date Details   04/17 Last INR check   INR: 2.8      04/20 This INR check   INR: 3.2!       Date of next INR:  02/09/2019

## 2019-02-09 ENCOUNTER — Encounter (HOSPITAL_COMMUNITY): Payer: Self-pay

## 2019-02-09 ENCOUNTER — Ambulatory Visit (HOSPITAL_COMMUNITY): Payer: Self-pay

## 2019-02-09 LAB — PT/INR: INR: 2.9

## 2019-02-09 NOTE — Progress Notes (Signed)
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  INR Range: 2.0-3.0  Tab strength: warfarin 2mg   Contact 1: [phone] cell  Contact 2:   Lab: [facility]  Comments:    INR: 2.9  Current Dose: warfarin 4mg  M, W/ 6mg  TUES since INR on Monday  Clinical Outcomes     Positives:   INR In Range          Plan: [facility] called in the INR and was instructed for pt to take 4mg  tonight, SAT and SUN/ 6mg  F with INR on Monday  Next INR Date: 02-13-2019    Trinna Post, RN  Specialty Care Nurse, [place]  [phone] (ext [phone]) or [phone] (ext [phone])  [name]          For additional information, see dosing calendar below.    Warfarin Therapy Instructions   April 2020 Details    Sun [place] Tue Wed Thu Fri Sat        1               2               3               4                 5               6               7               8               9               10               11                 12               13               14               15               16               17               18                 19               20    3.2   4 mg   See details      21      6 mg         22      4 mg         23   2.9   4 mg   See details      24      6 mg         25      4 mg           26      4 mg         27  28               29               30                   Date Details   04/20 Last INR check   INR: 3.2!      04/23 This INR check   INR: 2.9       Date of next INR:  02/13/2019

## 2019-02-13 ENCOUNTER — Encounter (HOSPITAL_COMMUNITY): Payer: Self-pay

## 2019-02-13 ENCOUNTER — Ambulatory Visit (HOSPITAL_COMMUNITY): Payer: Self-pay

## 2019-02-13 LAB — PT/INR: INR: 2

## 2019-02-13 NOTE — Progress Notes (Signed)
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  INR Range: 2.0-3.0  Tab strength: warfarin 2mg   Contact 1: [phone] cell  Contact 2:   Lab: [facility] [place]  Comments:    INR: 2.0  Current Dose: warfarin 6mg  last F/ 4mg  all other evenings  Clinical Outcomes     Positives:   INR In Range          Plan: [name] called in INR and was instructed for pt to take warfarin 6mg  tonight and tomorrow night/ 4mg  WED with INR on Thursday  Next INR Date: 02-16-2019    [name] Post, RN  Specialty Care Nurse, [place]  [phone] (ext [phone]) or [phone] (ext [phone])  [email or web address]          For additional information, see dosing calendar below.    Warfarin Therapy Instructions   April 2020 Details    Sun Molli Knock Tue Wed Thu Fri Sat        1               2               3               4                 5               6               7               8               9               10               11                 12               13               14               15               16               17               18                 19               20               21               22               23    2.9   4 mg   See details      24      6 mg         25      4 mg           26      4 mg         27   2.0   6 mg  See details      28      6 mg         29      4 mg         30                  Date Details   04/23 Last INR check   INR: 2.9      04/27 This INR check   INR: 2.0       Date of next INR:  02/16/2019

## 2019-02-14 ENCOUNTER — Encounter (HOSPITAL_COMMUNITY): Payer: Self-pay | Admitting: Social Worker

## 2019-02-14 NOTE — Progress Notes (Signed)
MSW received the following signed Physician Verbal Orders from Dr. Lacinda Axon:     dated 01/13/19 with order #[id number],   dated 01/18/19 with order #[id number],   dated 01/20/19 with order #[id number],   dated 01/30/19 with order #[id number].        Clinic staff faxed paperwork to [facility]) at [phone]. MSW will have orders scanned into patient's medical record at this time. MSW will continue to follow as otherwise needed.        [name] was provided with information about the following community resources:  Home Care:  [place], MSW, [place]

## 2019-02-16 ENCOUNTER — Ambulatory Visit (HOSPITAL_COMMUNITY): Payer: Self-pay

## 2019-02-16 ENCOUNTER — Encounter (HOSPITAL_COMMUNITY): Payer: Self-pay

## 2019-02-16 LAB — PT/INR: INR: 2.1

## 2019-02-16 NOTE — Progress Notes (Signed)
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  INR Range: 2.0-3.0  Tab strength: warfarin 2mg   Contact 1: [phone] cell  Contact 2:   [facility]   Comments:    INR: 2.1  Current Dose: warfarin 6mg  Mon, Tues/ 4mg  last night  Clinical Outcomes     Positives:   INR In Range          Plan: [facility] nurse was instructed for pt to take warfarin 6mg  daily with INR on Monday  Next INR Date: 02-20-2019    Trinna Post, RN  Specialty Care Nurse, [place], [facility]  [phone] (ext [phone]) or [phone] (ext [phone])  [email or web address]          For additional information, see dosing calendar below.    Warfarin Therapy Instructions   April 2020 Details    Sun Mon Tue Wed Thu Fri Sat        1               2               3               4                 5               6               7               8               9               10               11                 12               13               14               15               16               17               18                 19               20               21               22               23               24               25                 26               27    2.0   6 mg   See details      28  6 mg         29      4 mg         30   2.1   6 mg   See details         Date Details   04/27 Last INR check   INR: 2.0      04/30 This INR check   INR: 2.1              Warfarin Therapy Instructions   May 2020 Details    Sun Mon Tue Wed Thu Fri Sat          1      6 mg         2      6 mg           3      6 mg         4               5               6               7               8               9                 10               11               12               13               14               15               16                 17               18               19               20               21               22                23                 24               25               26               27               28               29               30                 31                       Date Details  No additional details    Date of next INR:  02/20/2019

## 2019-02-20 ENCOUNTER — Encounter (HOSPITAL_COMMUNITY): Payer: Self-pay

## 2019-02-20 ENCOUNTER — Ambulatory Visit (HOSPITAL_BASED_OUTPATIENT_CLINIC_OR_DEPARTMENT_OTHER): Payer: Self-pay | Admitting: Family

## 2019-02-20 ENCOUNTER — Ambulatory Visit (HOSPITAL_COMMUNITY): Payer: Self-pay

## 2019-02-20 LAB — PT/INR: INR: 2.7

## 2019-02-20 NOTE — Telephone Encounter (Signed)
Patient scheduled for MOHS at [facility] on 03-08-2019 with Dr. Renella Cunas for [facility] Left Upper Lip. Jan D Johnson  02/20/2019, 13:46

## 2019-02-20 NOTE — Progress Notes (Signed)
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          For additional information, see dosing calendar below.    Warfarin Therapy Instructions   April 2020 Details    Sun Molli Knock Tue Wed Thu Fri Sat        1               2               3               4                 5               6               7               8               9               10               11                 12               13               14               15               16               17               18                 19               20               21               22               23               24               25                 26               27                28  29               30   2.1   6 mg   See details         Date Details   04/30 Last INR check   INR: 2.1              Warfarin Therapy Instructions   May 2020 Details    Sun Mon Tue Wed Thu Fri Sat          1      6 mg         2      6 mg           3      6 mg         4   2.7   4 mg   See details      5      4 mg         6      6 mg         7      4 mg         8      4 mg         9      6 mg           10      4 mg         11               12               13               14               15               16                 17               18               19               20               21               22                23                 24               25               26               27               28               29               30                 31                       Date Details   05/04 This INR check   INR: 2.7       Date  of next INR:  02/27/2019

## 2019-02-27 ENCOUNTER — Encounter (HOSPITAL_COMMUNITY): Payer: Self-pay

## 2019-02-27 ENCOUNTER — Ambulatory Visit (HOSPITAL_COMMUNITY): Payer: Self-pay

## 2019-02-27 LAB — PT/INR
INR: 2.4
INR: 2.4

## 2019-02-27 NOTE — Progress Notes (Signed)
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  INR Range: 2.0-3.0  Tab strength: warfarin 2mg   Contact 1: [phone] cell  Contact 2:   Lab: [facility] [place]  Comments:    INR: 2.4  Current Dose: warfarin 6mg  SUN, WED, SAT/ 4mg  QD  Clinical Outcomes     Positives:   INR In Range          Plan: Pt and Tom with [facility] were both called and instructed for pt to take warfarin 6mg  M, W, F, SAT/ 4mg  daily with INR in 1 week  Next INR Date: 03-06-2019    Trinna Post, RN  Specialty Care Nurse, Anticoagulation  [place], [facility]  [phone] (ext [phone]) or [phone] (ext [phone])  [email or web address]          For additional information, see dosing calendar below.    Warfarin Therapy Instructions   May 2020 Details    Sun Mon Tue Wed Thu Fri Sat          1               2                 3               4    2.7   4 mg   See details      5      4 mg         6      6 mg         7      4 mg         8      4 mg         9      6 mg           10      4 mg         11   2.4   6 mg   See details      12      4 mg         13      6 mg         14      4 mg         15      6 mg         16      6 mg           17      4 mg         18               19               20               21               22               23                 24               25                26  27               28               29               30                 31                       Date Details   05/04 Last INR check   INR: 2.7      05/11 This INR check   INR: 2.4       Date of next INR:  03/06/2019

## 2019-02-28 ENCOUNTER — Ambulatory Visit (HOSPITAL_BASED_OUTPATIENT_CLINIC_OR_DEPARTMENT_OTHER): Payer: Self-pay | Admitting: Family

## 2019-02-28 ENCOUNTER — Other Ambulatory Visit (HOSPITAL_COMMUNITY): Payer: Self-pay

## 2019-02-28 MED ORDER — WARFARIN 2 MG TABLET
6.0000 mg | ORAL_TABLET | Freq: Every evening | ORAL | 1 refills | Status: DC
Start: 2019-02-28 — End: 2019-06-01

## 2019-02-28 NOTE — Telephone Encounter (Signed)
Left message for [place], regarding this patient message. Patient is currently scheduled for MOHS on 5/20. Left number for call back.   Takoda Siedlecki, RN  02/28/2019, 13:42

## 2019-02-28 NOTE — Telephone Encounter (Signed)
-----   Message from [place], [place] sent at 02/28/2019 11:50 AM EDT -----  Regarding: FW: Return call   ----- Message from [place] sent at 02/28/2019 11:18 AM EDT -----  [place] Dec pt     Pt has had a valve replacement recently and is needing a new med for mohs surgery \  Preferred Pharmacy     [place], [place]    [place] [address]    Phone: [phone] Fax: [phone]    Not a 24 hour pharmacy; exact hours not known.   Please return her call to discuss

## 2019-03-02 ENCOUNTER — Encounter (HOSPITAL_COMMUNITY): Payer: Self-pay | Admitting: Social Worker

## 2019-03-02 NOTE — Progress Notes (Signed)
MSW received the following signed Physician Verbal Orders from Dr. Lacinda Axon:     dated 01/16/19 with order #[id number],   dated 01/27/19 with order #[id number],   dated 02/03/19 with order #[id number],   dated 02/06/19 with order #[id number].       Clinic staff faxed paperwork to [facility]) at [phone]. MSW will have orders scanned into patient's medical record at this time. MSW will continue to follow as otherwise needed.            Shevonne Wolf was provided with information about the following community resources:  Home Care:  [place], MSW, [place]

## 2019-03-07 ENCOUNTER — Encounter (HOSPITAL_COMMUNITY): Payer: Self-pay

## 2019-03-07 ENCOUNTER — Ambulatory Visit (HOSPITAL_COMMUNITY): Payer: Self-pay

## 2019-03-07 LAB — PT/INR
INR: 2.4
INR: 2.4

## 2019-03-09 ENCOUNTER — Ambulatory Visit (HOSPITAL_BASED_OUTPATIENT_CLINIC_OR_DEPARTMENT_OTHER): Payer: Self-pay | Admitting: Family

## 2019-03-09 NOTE — Telephone Encounter (Signed)
Patient had MOHS on 03-08-2019 by Dr. Renella Cunas at [facility] for [facility] Left Upper Lip. Jeneen Rinks  03/09/2019, 13:57

## 2019-03-12 DIAGNOSIS — I10 Essential (primary) hypertension: Secondary | ICD-10-CM

## 2019-03-12 DIAGNOSIS — Z4801 Encounter for change or removal of surgical wound dressing: Secondary | ICD-10-CM

## 2019-03-12 DIAGNOSIS — C44309 Unspecified malignant neoplasm of skin of other parts of face: Secondary | ICD-10-CM

## 2019-03-12 DIAGNOSIS — Z90710 Acquired absence of both cervix and uterus: Secondary | ICD-10-CM

## 2019-03-12 DIAGNOSIS — M1991 Primary osteoarthritis, unspecified site: Secondary | ICD-10-CM

## 2019-03-12 DIAGNOSIS — J439 Emphysema, unspecified: Secondary | ICD-10-CM

## 2019-03-12 DIAGNOSIS — G8929 Other chronic pain: Secondary | ICD-10-CM

## 2019-03-12 DIAGNOSIS — Z483 Aftercare following surgery for neoplasm: Secondary | ICD-10-CM

## 2019-03-12 DIAGNOSIS — I350 Nonrheumatic aortic (valve) stenosis: Secondary | ICD-10-CM

## 2019-03-12 DIAGNOSIS — Z7901 Long term (current) use of anticoagulants: Secondary | ICD-10-CM

## 2019-03-12 DIAGNOSIS — Z5181 Encounter for therapeutic drug level monitoring: Secondary | ICD-10-CM

## 2019-03-12 DIAGNOSIS — Z952 Presence of prosthetic heart valve: Secondary | ICD-10-CM

## 2019-03-12 DIAGNOSIS — Z96642 Presence of left artificial hip joint: Secondary | ICD-10-CM

## 2019-03-12 DIAGNOSIS — I714 Abdominal aortic aneurysm, without rupture: Secondary | ICD-10-CM

## 2019-03-12 DIAGNOSIS — Z87891 Personal history of nicotine dependence: Secondary | ICD-10-CM

## 2019-03-14 ENCOUNTER — Ambulatory Visit (HOSPITAL_COMMUNITY): Payer: Self-pay

## 2019-03-14 ENCOUNTER — Encounter (HOSPITAL_COMMUNITY): Payer: Self-pay

## 2019-03-14 LAB — PT/INR: INR: 2.6

## 2019-03-14 NOTE — Progress Notes (Signed)
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 spoke with [name] from [facility] agency)          Plan: pt to continue current dosing and recheck INR in one week. Dosing verified by [facility] nurse.    Next INR Date: 03/21/19    Leone Brand, BSN RN  Specialty Care Nurse, Anticoagulation  [place] [facility]  Ph. [phone]  (ext. [phone])  Fax [phone]  [email or web address]        For additional information, see dosing calendar below.    Warfarin Therapy Instructions   May 2020 Details    Sun Mon Tue Wed Thu Fri Sat          1               2                 3               4               5               6               7               8               9                 10               11               12               13               14               15               16                 17               18               19      4  mg   See details      20      6 mg         21      4 mg         22      6 mg         23      6 mg           24      4 mg         25      6 mg         26   2.6   4 mg  See details      27      6 mg         28      4 mg         29      6 mg         30      6 mg           31      4 mg                Date Details   05/19 Last INR check      05/26 This INR check   INR: 2.6              Warfarin Therapy Instructions   June 2020 Details    Sun Mon Tue Wed Thu Fri Sat      1      6 mg         2      4 mg         3               4               5               6                 7               8               9               10               11               12               13                 14               15               16               17                 18               19               20                 21               22               23               24               25               26               27                 28                29  30                    Date Details   No additional details    Date of next INR:  03/21/2019                    I agree with plan of care of anticoagulation team as noted above.         Royston Cowper Analucia Hush, DO [facility]

## 2019-03-16 ENCOUNTER — Encounter (HOSPITAL_COMMUNITY): Payer: Self-pay | Admitting: Nurse Practitioner

## 2019-03-17 ENCOUNTER — Encounter (HOSPITAL_COMMUNITY): Payer: Self-pay | Admitting: Family

## 2019-03-20 ENCOUNTER — Encounter (HOSPITAL_COMMUNITY): Payer: Self-pay

## 2019-03-20 ENCOUNTER — Ambulatory Visit (HOSPITAL_COMMUNITY): Payer: Self-pay

## 2019-03-20 LAB — PT/INR: INR: 2.3

## 2019-03-20 NOTE — Progress Notes (Signed)
Anticoagulation Encounter Note:    Surgical AVR plan  Warfarin start date: 01-06-2019  HVI Staff: Lacinda Axon / Daggubati   DX : On-X AVR  INR Range: 2.0-3.0  Tab strength: warfarin 2mg   Contact 1: [phone] cell  Contact 2:   Lab: [facility] [place]  Comments:    INR: 2.3    Current Dose: 4 mg Su,Tu,Th/ 6 mg all other days    Clinical Outcomes     Positives:   INR In Range (INR good. spoke with [name] from [facility] agency)          Plan: pt to continue current dosing and recheck INR in one week. Dosing verified by [facility] nurse.    Next INR Date: 03/27/19    Leone Brand, BSN RN  Specialty Care Nurse, Anticoagulation  [place] [facility]  Ph. [phone]  (ext. [phone])  Fax [phone]  [email or web address]        For additional information, see dosing calendar below.    Warfarin Therapy Instructions   May 2020 Details    Sun Mon Tue Wed Thu Fri Sat          1               2                 3               4               5               6               7               8               9                 10               11               12               13               14               15               16                 17               18               19               20               21               22               23                 24               25               26    2.6   4 mg   See details      27  6 mg         28      4 mg         29      6 mg         30      6 mg           31      4 mg                Date Details   05/26 Last INR check   INR: 2.6              Warfarin Therapy Instructions   June 2020 Details    Sun Mon Tue Wed Thu Fri Sat      1   2.3   6 mg   See details      2      4 mg         3      6 mg         4      4 mg         5      6 mg         6      6 mg           7      4 mg         8      6 mg         9               10               11               12               13                 14               15               16               17               18               19                20                 21               22               23               24               25               26               27                 28               29                30  Date Details   06/01 This INR check   INR: 2.3       Date of next INR:  03/27/2019

## 2019-03-23 ENCOUNTER — Encounter (HOSPITAL_COMMUNITY): Payer: Self-pay | Admitting: Social Worker

## 2019-03-23 NOTE — Progress Notes (Signed)
MSW received signed Physician Verbal Order from Dr. Lacinda Axon dated 02/27/19 with order #[id number]. Clinic staff faxed paperwork to [facility]) at [phone]. MSW will have orders scanned into patient's medical record at this time. MSW will continue to follow as otherwise needed.      Maylin [place] was provided with information about the following community resources:  Home Care:  [place], MSW, [place]

## 2019-03-27 ENCOUNTER — Encounter (HOSPITAL_COMMUNITY): Payer: Self-pay

## 2019-03-27 ENCOUNTER — Ambulatory Visit (HOSPITAL_COMMUNITY): Payer: Self-pay

## 2019-03-27 LAB — PT/INR: INR: 1.9

## 2019-03-27 NOTE — Progress Notes (Signed)
Anticoagulation Encounter Note:    Surgical AVR plan  Warfarin start date: 01-06-2019  HVI Staff: Lacinda Axon / Daggubati   DX : On-X AVR  INR Range: 2.0-3.0  Tab strength: warfarin 2mg   Contact 1: [phone] cell  Contact 2:   Lab: [facility] [place]  Comments:    INR: 1.9    Current Dose: 4 mg Su,Tu,Th/ 6 mg all other days    Clinical Outcomes     Positives:   INR Below Goal (INR low. spoke with [name], [facility] nurse)        Patient Findings     Negatives:   Change in health, Change in medications, Change in diet/appetite        Plan: instructed for pt to take warfarin 4 mg Su,Th/ 6 mg all other days and recheck INR in one week. Dosing verified by [facility] nurse.     Next INR Date: 04/03/19    Leone Brand, BSN RN  Specialty Care Nurse, Anticoagulation  [facility]  Ph. [phone]  (ext. [phone])  Fax [phone]  [email or web address]        For additional information, see dosing calendar below.    Warfarin Therapy Instructions   June 2020 Details    Sun Mon Tue Wed Thu Fri Sat      1   2.3   6 mg   See details      2      4 mg         3      6 mg         4      4 mg         5      6 mg         6      6 mg           7      4 mg         8   1.9   6 mg   See details      9      6 mg         10      6 mg         11      4 mg         12      6 mg         13      6 mg           14      4 mg         15      6 mg         16               17               18               19               20                 21               22               23                24  25               26               27                 28               29               30                     Date Details   06/01 Last INR check   INR: 2.3      06/08 This INR check   INR: 1.9!       Date of next INR:  04/03/2019

## 2019-04-03 ENCOUNTER — Encounter (HOSPITAL_COMMUNITY): Payer: Self-pay

## 2019-04-03 ENCOUNTER — Ambulatory Visit (HOSPITAL_COMMUNITY): Payer: Self-pay

## 2019-04-03 LAB — PT/INR: INR: 2.9

## 2019-04-03 NOTE — Progress Notes (Signed)
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 spoke with Maudie Mercury from [facility] agency. )          Plan: instructed for pt to take warfarin 4 mg Su,Tu,Th/ 6 mg all other days and recheck INR in one week. Dosing verified by [facility] nurse.     Next INR Date: 04/10/19      Leone Brand, BSN RN  Specialty Care Nurse, Anticoagulation  [place] [facility]  Ph. [phone]  (ext. [phone])  Fax [phone]  [email or web address]        For additional information, see dosing calendar below.    Warfarin Therapy Instructions   June 2020 Details    Sun Mon Tue Wed Thu Fri Sat      1               2               3               4               5               6                 7               8    1.9   6 mg   See details      9      6 mg         10      6 mg         11      4 mg         12      6 mg         13      6 mg           14      4 mg         15   2.9   6 mg   See details      16      4 mg         17      6 mg         18      4 mg         19      6 mg         20      6 mg           21      4 mg         22      6 mg         23               24               25  26               27                 28               29               30                     Date Details   06/08 Last INR check   INR: 1.9!      06/15 This INR check   INR: 2.9       Date of next INR:  04/10/2019

## 2019-04-06 ENCOUNTER — Other Ambulatory Visit (HOSPITAL_COMMUNITY): Payer: Self-pay | Admitting: PHYSICIAN ASSISTANT

## 2019-04-06 ENCOUNTER — Other Ambulatory Visit (HOSPITAL_COMMUNITY): Payer: Self-pay | Admitting: Interventional Cardiology

## 2019-04-06 DIAGNOSIS — I350 Nonrheumatic aortic (valve) stenosis: Secondary | ICD-10-CM

## 2019-04-06 DIAGNOSIS — Z952 Presence of prosthetic heart valve: Secondary | ICD-10-CM

## 2019-04-10 ENCOUNTER — Ambulatory Visit (HOSPITAL_COMMUNITY): Payer: Self-pay

## 2019-04-10 ENCOUNTER — Encounter (HOSPITAL_COMMUNITY): Payer: Self-pay | Admitting: Social Worker

## 2019-04-10 ENCOUNTER — Encounter (HOSPITAL_COMMUNITY): Payer: Self-pay

## 2019-04-10 ENCOUNTER — Other Ambulatory Visit (HOSPITAL_BASED_OUTPATIENT_CLINIC_OR_DEPARTMENT_OTHER): Payer: Self-pay | Admitting: Family Medicine

## 2019-04-10 DIAGNOSIS — Z006 Encounter for examination for normal comparison and control in clinical research program: Secondary | ICD-10-CM

## 2019-04-10 LAB — PT/INR: INR: 2.9

## 2019-04-10 NOTE — Progress Notes (Signed)
MSW received a signed [place] from Dr. Lacinda Axon dated 03/12/19 to 05/10/19 with order #[id number]. Clinic staff faxed paperwork to [facility]) at [phone]. This Certification and Plan of Care can be used for billing purposes and will be sent to billing at this time. Copy scanned into patient's medical record. MSW will continue to follow as otherwise needed.          [name] was provided with information about the following community resources:  Home Care:  [place], MSW, [place]

## 2019-04-10 NOTE — Progress Notes (Signed)
Anticoagulation Encounter Note:    Surgical AVR plan  Warfarin start date: 01-06-2019  HVI Staff: Lacinda Axon / Shaquelle Hernon   DX : On-X AVR  INR Range: 2.0-3.0  Tab strength: warfarin 2mg   Contact 1: [phone] cell  Contact 2:   Lab: [facility] [place]  Comments:    INR: 2.9    Current Dose: 4 mg Su,Tu,Th/ 6 mg all other days    Clinical Outcomes     Positives:   INR In Range (INR good. spoke with [name] from [facility] agency)          Plan: pt to continue current dosing and recheck INR in one week. Dosing verified by [facility] nurse.    Next INR Date: 04/17/19    Leone Brand, BSN RN  Specialty Care Nurse, Anticoagulation   [facility]  Ph. [phone]  (ext. [phone])  Fax [phone]  [email or web address]        For additional information, see dosing calendar below.    Warfarin Therapy Instructions   June 2020 Details    Sun Mon Tue Wed Thu Fri Sat      1               2               3               4               5               6                 7               8               9               10               11               12               13                 14               15    2.9   6 mg   See details      16      4 mg         17      6 mg         18      4 mg         19      6 mg         20      6 mg           21      4 mg         22   2.9   6 mg   See details      23      4 mg         24      6 mg         25      4 mg         26      6 mg  27      6 mg           28      4 mg         29      6 mg         30                    Date Details   06/15 Last INR check   INR: 2.9      06/22 This INR check   INR: 2.9       Date of next INR:  04/17/2019

## 2019-04-11 ENCOUNTER — Ambulatory Visit (HOSPITAL_BASED_OUTPATIENT_CLINIC_OR_DEPARTMENT_OTHER): Payer: 59

## 2019-04-17 ENCOUNTER — Ambulatory Visit (HOSPITAL_COMMUNITY): Payer: Self-pay

## 2019-04-17 ENCOUNTER — Ambulatory Visit: Payer: 59 | Attending: Family | Admitting: Rheumatology

## 2019-04-17 ENCOUNTER — Other Ambulatory Visit: Payer: Self-pay

## 2019-04-17 DIAGNOSIS — Z006 Encounter for examination for normal comparison and control in clinical research program: Secondary | ICD-10-CM

## 2019-04-17 DIAGNOSIS — Z952 Presence of prosthetic heart valve: Secondary | ICD-10-CM | POA: Insufficient documentation

## 2019-04-17 DIAGNOSIS — I350 Nonrheumatic aortic (valve) stenosis: Secondary | ICD-10-CM | POA: Insufficient documentation

## 2019-04-17 LAB — PT/INR
INR: 2.76 — ABNORMAL HIGH (ref 0.80–1.20)
PROTHROMBIN TIME: 32.8 s — ABNORMAL HIGH (ref 9.1–13.9)

## 2019-04-17 NOTE — Progress Notes (Signed)
Anticoagulation Encounter Note:    Surgical AVR plan  Warfarin start date: 01-06-2019  HVI Staff: Lacinda Axon / Chalee Hirota   DX : On-X AVR  INR Range: 2.0-3.0  Tab strength: warfarin 2mg   Contact 1: [phone] cell  Contact 2:   Lab: [facility]  Comments:    INR: 2.76  Current Dose: 4 mg Sunday, Tuesday and Thursday; 6 mg all other days.  Clinical Outcomes     Positives:   INR In Range          Plan: 4 mg Sunday, Tuesday and Thursday; 6 mg all other days. Confirmed dosing with patient.   Next INR Date: 04/2019    Kate Zweifel, BSN       For additional information, see dosing calendar below.    Warfarin Therapy Instructions   June 2020 Details    Sun Mon Tue Wed Thu Fri Sat      1               2               3               4               5               6                 7               8               9               10               11               12               13                 14               15               16               17               18               19               20                 21               22   2.9   6 mg   See details      23      4 mg         24      6 mg         25      4 mg         26      6 mg         27      6 mg           28      4 mg         29    2.76  6 mg   See details      30      4 mg              Date Details   06/22 Last INR check   INR: 2.9      06/29 This INR check   INR: 2.76              Warfarin Therapy Instructions   July 2020 Details    Sun Mon Tue Wed Thu Fri Sat        1      6 mg         2      4 mg         3      6 mg         4      6 mg           5      4 mg         6      6 mg         7      4 mg         8      6 mg         9      4 mg         10      6 mg         11      6 mg           12      4 mg         13      6 mg         14      4 mg         15      6 mg         16      4 mg         17      6 mg         18      6 mg           19      4 mg         20      6 mg         21               22               23               24               25                    26               27               28               29               30               31                  Date Details   No additional details  Date of next INR:  05/08/2019

## 2019-04-18 ENCOUNTER — Ambulatory Visit (HOSPITAL_COMMUNITY)
Admission: RE | Admit: 2019-04-18 | Discharge: 2019-04-18 | Disposition: A | Discharge status: Still In Hospital | Payer: 59 | Source: Ambulatory Visit

## 2019-04-18 ENCOUNTER — Other Ambulatory Visit (HOSPITAL_COMMUNITY): Payer: Self-pay

## 2019-04-18 DIAGNOSIS — Z952 Presence of prosthetic heart valve: Secondary | ICD-10-CM | POA: Insufficient documentation

## 2019-04-18 LAB — HGA1C (HEMOGLOBIN A1C WITH EST AVG GLUCOSE)
ESTIMATED AVERAGE GLUCOSE: 105 mg/dL
HEMOGLOBIN A1C: 5.3 % (ref 4.0–5.6)

## 2019-04-18 MED ADMIN — sodium chloride 0.9 % (flush) injection syringe: @ 22:00:00

## 2019-04-18 MED ADMIN — lactated Ringers intravenous solution: INTRAVENOUS | @ 10:00:00 | NDC 00338011704

## 2019-04-18 MED ADMIN — lactated Ringers intravenous solution: INTRAVENOUS | @ 14:00:00

## 2019-04-18 MED ADMIN — lactated Ringers intravenous solution: INTRAVENOUS | @ 19:00:00 | NDC 00338011704

## 2019-04-18 NOTE — Progress Note-[facility] w sigreq (Signed)
I agree to assessment and plan of cardiac rehabilitation staff. Any changes if required have been edited and updated.

## 2019-04-19 MED ADMIN — sodium chloride 0.9 % intravenous solution: INTRAVENOUS | @ 13:00:00 | NDC 00338004904

## 2019-04-20 ENCOUNTER — Other Ambulatory Visit: Payer: Self-pay

## 2019-04-20 ENCOUNTER — Ambulatory Visit (HOSPITAL_COMMUNITY)
Admission: RE | Admit: 2019-04-20 | Discharge: 2019-04-20 | Disposition: A | Discharge status: Still In Hospital | Payer: 59 | Source: Ambulatory Visit | Attending: PHYSICIAN ASSISTANT | Admitting: PHYSICIAN ASSISTANT

## 2019-04-24 ENCOUNTER — Ambulatory Visit (HOSPITAL_COMMUNITY)
Admission: RE | Admit: 2019-04-24 | Discharge: 2019-04-24 | Disposition: A | Discharge status: Still In Hospital | Payer: 59 | Source: Ambulatory Visit | Attending: PHYSICIAN ASSISTANT | Admitting: PHYSICIAN ASSISTANT

## 2019-04-24 ENCOUNTER — Other Ambulatory Visit: Payer: Self-pay

## 2019-04-26 ENCOUNTER — Other Ambulatory Visit: Payer: Self-pay

## 2019-04-26 ENCOUNTER — Ambulatory Visit (HOSPITAL_COMMUNITY)
Admission: RE | Admit: 2019-04-26 | Discharge: 2019-04-26 | Disposition: A | Discharge status: Still In Hospital | Payer: 59 | Source: Ambulatory Visit | Attending: PHYSICIAN ASSISTANT | Admitting: PHYSICIAN ASSISTANT

## 2019-04-27 ENCOUNTER — Ambulatory Visit (HOSPITAL_COMMUNITY)
Admission: RE | Admit: 2019-04-27 | Discharge: 2019-04-27 | Disposition: A | Discharge status: Still In Hospital | Payer: 59 | Source: Ambulatory Visit | Attending: PHYSICIAN ASSISTANT | Admitting: PHYSICIAN ASSISTANT

## 2019-05-01 ENCOUNTER — Ambulatory Visit (HOSPITAL_COMMUNITY)
Admission: RE | Admit: 2019-05-01 | Discharge: 2019-05-01 | Disposition: A | Discharge status: Still In Hospital | Payer: 59 | Source: Ambulatory Visit | Attending: PHYSICIAN ASSISTANT | Admitting: PHYSICIAN ASSISTANT

## 2019-05-01 ENCOUNTER — Other Ambulatory Visit: Payer: Self-pay

## 2019-05-03 ENCOUNTER — Ambulatory Visit (HOSPITAL_COMMUNITY)
Admission: RE | Admit: 2019-05-03 | Discharge: 2019-05-03 | Disposition: A | Discharge status: Still In Hospital | Payer: 59 | Source: Ambulatory Visit | Attending: PHYSICIAN ASSISTANT | Admitting: PHYSICIAN ASSISTANT

## 2019-05-03 ENCOUNTER — Other Ambulatory Visit: Payer: Self-pay

## 2019-05-04 ENCOUNTER — Ambulatory Visit (HOSPITAL_COMMUNITY)
Admission: RE | Admit: 2019-05-04 | Discharge: 2019-05-04 | Disposition: A | Discharge status: Still In Hospital | Payer: 59 | Source: Ambulatory Visit | Attending: PHYSICIAN ASSISTANT | Admitting: PHYSICIAN ASSISTANT

## 2019-05-05 ENCOUNTER — Encounter (HOSPITAL_COMMUNITY): Payer: Self-pay | Admitting: Social Worker

## 2019-05-05 NOTE — Progress Notes (Signed)
MSW received signed Physician Verbal Order from Dr. Lacinda Axon dated 04/10/19 with order #[id number]. Clinic staff faxed paperwork to [facility]) at [phone]. MSW will have orders scanned into patient's medical record at this time. MSW will continue to follow as otherwise needed.          [name] was provided with information about the following community resources:  Home Care:  Anna, MSW, [place]

## 2019-05-07 ENCOUNTER — Other Ambulatory Visit (HOSPITAL_COMMUNITY): Payer: Self-pay | Admitting: Nurse Practitioner

## 2019-05-08 ENCOUNTER — Ambulatory Visit (HOSPITAL_COMMUNITY): Payer: Self-pay

## 2019-05-08 ENCOUNTER — Ambulatory Visit: Payer: 59 | Attending: Family | Admitting: Rheumatology

## 2019-05-08 ENCOUNTER — Ambulatory Visit (HOSPITAL_COMMUNITY)
Admission: RE | Admit: 2019-05-08 | Discharge: 2019-05-08 | Disposition: A | Discharge status: Still In Hospital | Payer: 59 | Source: Ambulatory Visit | Attending: PHYSICIAN ASSISTANT | Admitting: PHYSICIAN ASSISTANT

## 2019-05-08 ENCOUNTER — Other Ambulatory Visit: Payer: Self-pay

## 2019-05-08 DIAGNOSIS — Z952 Presence of prosthetic heart valve: Secondary | ICD-10-CM | POA: Insufficient documentation

## 2019-05-08 DIAGNOSIS — I350 Nonrheumatic aortic (valve) stenosis: Secondary | ICD-10-CM | POA: Insufficient documentation

## 2019-05-08 LAB — PT/INR
INR: 2.58 — ABNORMAL HIGH (ref 0.80–1.20)
PROTHROMBIN TIME: 30.6 s — ABNORMAL HIGH (ref 9.1–13.9)

## 2019-05-08 NOTE — Progress Notes (Signed)
Anticoagulation Encounter Note:    Surgical AVR plan  Warfarin start date: 01-06-2019  HVI Staff: Lacinda Axon / Nesreen Albano   DX : On-X AVR  INR Range: 2.0-3.0  Tab strength: warfarin 2mg   Contact 1: [phone] cell  Contact 2:   Lab: [facility]   Comments:    INR: 2.58  Current Dose: 4 mg Sunday, Tuesday and Thursday; 6 mg all other days.   Clinical Outcomes     Positives:   INR In Range          Plan: 4 mg Sunday, Tuesday and Thursday; 6 mg all other days. Test INR in 3 weeks. Confirmed dosing and testing with patient.   Next INR Date: 05/29/2019    Kate Zweifel, BSN       For additional information, see dosing calendar below.    Warfarin Therapy Instructions   June 2020 Details    Sun Mon Tue Wed Thu Fri Sat      1               2               3               4               5               6                 7               8               9               10               11               12               13                 14               15               16               17               18               19               20                 21               22               23               24               25               26               27                 28               29    2.76  6 mg   See details      30      4 mg              Date Details   06/29 Last INR check   INR: 2.76              Warfarin Therapy Instructions   July 2020 Details    Sun Mon Tue Wed Thu Fri Sat        1      6 mg         2      4 mg         3      6 mg         4      6 mg           5      4 mg         6      6 mg         7      4 mg         8      6 mg         9      4 mg         10      6 mg         11      6 mg           12      4 mg         13      6 mg         14      4 mg         15      6 mg         16      4 mg         17      6 mg         18      6 mg           19      4 mg         20   2.58   6 mg   See details      21      4 mg         22      6 mg         23      4 mg         24      6 mg         25      6 mg            26      4 mg         27      6 mg         28      4 mg         29      6 mg         30      4 mg         31      6 mg           Date Details   07/20 This INR check  INR: 2.58              Warfarin Therapy Instructions   August 2020 Details    Sun Mon Tue Wed Thu Fri Sat           1      6 mg           2      4 mg         3      6 mg         4      4 mg         5      6 mg         6      4 mg         7      6 mg         8      6 mg           9      4 mg         10      6 mg         11               12               13               14               15                 16               17               18               19               20               21               22                 23               24               25               26               27               28               29                 30               31                      Date Details   No additional details    Date of next INR:  05/29/2019

## 2019-05-09 NOTE — Progress Notes (Signed)
I discussed the patient's care with the Resident prior to the patient leaving the clinic. Any significant discussion points are noted.    [name], MD 05/09/2019, 12:39

## 2019-05-10 ENCOUNTER — Ambulatory Visit (HOSPITAL_COMMUNITY)
Admission: RE | Admit: 2019-05-10 | Discharge: 2019-05-10 | Disposition: A | Discharge status: Still In Hospital | Payer: 59 | Source: Ambulatory Visit | Attending: PHYSICIAN ASSISTANT | Admitting: PHYSICIAN ASSISTANT

## 2019-05-10 ENCOUNTER — Other Ambulatory Visit (HOSPITAL_COMMUNITY): Payer: Self-pay | Admitting: PHYSICIAN ASSISTANT

## 2019-05-10 ENCOUNTER — Encounter (HOSPITAL_COMMUNITY): Payer: Self-pay | Admitting: Nurse Practitioner

## 2019-05-10 ENCOUNTER — Other Ambulatory Visit: Payer: Self-pay

## 2019-05-11 ENCOUNTER — Other Ambulatory Visit: Payer: Self-pay

## 2019-05-11 ENCOUNTER — Encounter (HOSPITAL_BASED_OUTPATIENT_CLINIC_OR_DEPARTMENT_OTHER): Payer: Self-pay | Admitting: Family

## 2019-05-11 ENCOUNTER — Ambulatory Visit (HOSPITAL_COMMUNITY)
Admission: RE | Admit: 2019-05-11 | Discharge: 2019-05-11 | Disposition: A | Discharge status: Still In Hospital | Payer: 59 | Source: Ambulatory Visit | Attending: PHYSICIAN ASSISTANT | Admitting: PHYSICIAN ASSISTANT

## 2019-05-11 ENCOUNTER — Other Ambulatory Visit (HOSPITAL_COMMUNITY): Payer: Self-pay | Admitting: Interventional Cardiology

## 2019-05-15 ENCOUNTER — Other Ambulatory Visit: Payer: Self-pay

## 2019-05-15 ENCOUNTER — Ambulatory Visit (HOSPITAL_COMMUNITY)
Admission: RE | Admit: 2019-05-15 | Discharge: 2019-05-15 | Disposition: A | Discharge status: Still In Hospital | Payer: 59 | Source: Ambulatory Visit | Attending: PHYSICIAN ASSISTANT | Admitting: PHYSICIAN ASSISTANT

## 2019-05-16 NOTE — Progress Note-[facility] w sigreq (Signed)
I agree to assessment and plan of cardiac rehabilitation staff. Any changes if required have been edited and updated.

## 2019-05-17 ENCOUNTER — Ambulatory Visit
Admission: RE | Admit: 2019-05-17 | Discharge: 2019-05-17 | Disposition: A | Discharge status: Still In Hospital | Payer: 59 | Source: Ambulatory Visit | Attending: Cardiovascular Disease | Admitting: Cardiovascular Disease

## 2019-05-17 ENCOUNTER — Other Ambulatory Visit: Payer: Self-pay

## 2019-05-18 ENCOUNTER — Ambulatory Visit (HOSPITAL_COMMUNITY)
Admission: RE | Admit: 2019-05-18 | Discharge: 2019-05-18 | Disposition: A | Discharge status: Still In Hospital | Payer: 59 | Source: Ambulatory Visit | Attending: PHYSICIAN ASSISTANT | Admitting: PHYSICIAN ASSISTANT

## 2019-05-18 DIAGNOSIS — Z952 Presence of prosthetic heart valve: Secondary | ICD-10-CM | POA: Insufficient documentation

## 2019-05-22 ENCOUNTER — Ambulatory Visit (HOSPITAL_COMMUNITY)
Admission: RE | Admit: 2019-05-22 | Discharge: 2019-05-22 | Disposition: A | Discharge status: Still In Hospital | Payer: 59 | Source: Ambulatory Visit | Attending: PHYSICIAN ASSISTANT | Admitting: PHYSICIAN ASSISTANT

## 2019-05-22 ENCOUNTER — Other Ambulatory Visit: Payer: Self-pay

## 2019-05-24 ENCOUNTER — Ambulatory Visit (HOSPITAL_COMMUNITY): Payer: Self-pay

## 2019-05-24 ENCOUNTER — Encounter (HOSPITAL_BASED_OUTPATIENT_CLINIC_OR_DEPARTMENT_OTHER): Payer: Self-pay | Admitting: Family

## 2019-05-24 ENCOUNTER — Other Ambulatory Visit: Payer: Self-pay

## 2019-05-24 ENCOUNTER — Ambulatory Visit: Payer: 59 | Attending: DERMATOLOGY | Admitting: Family

## 2019-05-24 VITALS — Temp 97.3°F | Ht 65.63 in | Wt 223.3 lb

## 2019-05-24 DIAGNOSIS — D485 Neoplasm of uncertain behavior of skin: Secondary | ICD-10-CM

## 2019-05-24 DIAGNOSIS — L821 Other seborrheic keratosis: Secondary | ICD-10-CM | POA: Insufficient documentation

## 2019-05-24 DIAGNOSIS — Z85828 Personal history of other malignant neoplasm of skin: Secondary | ICD-10-CM | POA: Insufficient documentation

## 2019-05-24 DIAGNOSIS — L408 Other psoriasis: Secondary | ICD-10-CM | POA: Insufficient documentation

## 2019-05-24 MED ORDER — TRIAMCINOLONE ACETONIDE 0.1 % TOPICAL CREAM
TOPICAL_CREAM | Freq: Two times a day (BID) | CUTANEOUS | 3 refills | Status: DC
Start: 2019-05-24 — End: 2022-02-04

## 2019-05-24 NOTE — Progress Notes (Signed)
Dermatology, [facility]  [place] [address]  [phone]    Date:   05/24/2019  Name: [name]  Age: 63 y.o.    Chief complaint: Skin Check    HPI  [name] is a 63 y.o. female with a history of BCC on her left upper lip s/p MOHs on 03/08/2019 presenting for a skin check. 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 States that her PCP prescribes her Clobetasol solution with improvement. States she had itching over the last 6 months in her gluteal cleft. States it is very irritating and red. States that she is using Vaseline with minimal improvement.  Otherwise denies any new, changing, bleeding, or rapidly growing lesions.  No other skin-related complaints. Her sister has a history of a NMSC.         Review of Systems   Constitutional: Negative for chills and fever.   Skin: Negative for itching and rash.     Current Medications  . acetaminophen (TYLENOL) 500 mg Oral Tablet Take 500 mg by mouth Every 4 hours as needed for Pain   . amiodarone (PACERONE) 200 mg Oral Tablet Take 2 Tabs (400 mg total) by mouth Twice daily for 14 doses (Patient not taking: Reported on 02/02/2019)   . amiodarone (PACERONE) 200 mg Oral Tablet Take 2 Tabs (400 mg total) by mouth Once a day for 7 doses (Patient not taking: Reported on 02/02/2019)   . amiodarone (PACERONE) 200 mg Oral Tablet Take 1 Tab (200 mg total) by mouth Once a day for 7 doses (Patient not taking: Reported on 02/02/2019)   . aspirin 81 mg Oral Tablet, Chewable Take 1 Tab (81 mg total) by mouth Once a day   . clobetasoL (CORMAX) 0.05 % Solution by Apply Topically route Twice daily   . clobetasoL (TEMOVATE) 0.05 % Cream by Apply Topically route Twice daily   . fluticasone propionate (FLONASE) 50 mcg/actuation Nasal Spray, Suspension 1 Spray by Each Nostril route Once a day   . loratadine (CLARITIN) 10 mg Oral Tablet Take 10 mg by mouth Once a day   . metoprolol tartrate (LOPRESSOR) 50 mg Oral Tablet  TAKE 1 TABLET (50MG ) BY MOUTH TWICE DAILY   . multivitamin Oral Tablet Take 1 Tab by mouth Once a day   . triamcinolone acetonide (ARISTOCORT A) 0.1 % Cream Apply topically Twice daily   . warfarin (COUMADIN) 2 mg Oral Tablet Take 3 Tabs (6 mg total) by mouth Every evening     Allergies   Allergen Reactions   . Iv Contrast Anaphylaxis   . Oregano      Hives     . Stadol [Butorphanol Tartrate] Mental Status Effect   . Tomato Hives/ Urticaria     Past Medical History:   Diagnosis Date   . 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 Aortic stenosis    . Arthritis    . Blood in stool     positive cologuard.  Has not yet scheduled colonoscopy   . Cancer (CMS HCC)     skin cancer L lip.  Needs MOHS to be scheduled   . Chronic pain    . Colon polyp    . Dyspnea on exertion     with 2 flights   . Exercise intolerance    . Heart murmur     echo 02/2016, aortic stenosis   . Heartburn    .  High blood pressure    . Motion sickness    . Nausea with vomiting    . Obesity    . Rash     scalp psorriais and involves R ear   . Valvular disease     aortic valve stenosis, following with CT surgery, echo 02/2016 and cath 03/2016   . Wears glasses          Physical Exam  Vitals: Temperature 36.3 C (97.3 F), height 1.667 m (5' 5.63"), weight 101 kg (223 lb 5.2 oz), not currently breastfeeding.  Physical Exam  Constitutional:       General: She is not in acute distress.     Appearance: Normal appearance.   HENT:      Head:     Skin:     General: Skin is warm and dry.      Coloration: Skin is not pale.          Neurological:      Mental Status: She is alert and oriented to person, place, and time. Mental status is at baseline.      Coordination: Coordination normal.   Psychiatric:         Mood and Affect: Mood normal.     General skin exam was performed including head, neck, anterior/posterior trunk, bilateral upper & lower extremities and revealed no areas of concern other than those documented.    Assessment and  Plan  Problem List Items Addressed This Visit     None      Visit Diagnoses     Neoplasm of uncertain behavior of skin    -  Primary    Relevant Orders    SURGICAL PATHOLOGY SPECIMEN    Shave Lesion    History of basal cell carcinoma (BCC)        Inverse psoriasis          1. History of BCC s/p MOHs on 03/08/2019 (see #1 on face diagram)  - No reoccurrence noted  -Will follow     2. Neoplasm of uncertain behavior favor   Stucco keratosis vs BCC (see #1 on body diagram)  -- TIME OUT: A time out was performed to confirm the correct patient, procedure, and site. Consent obtained, area cleaned, and anesthetized. A shave biopsy was performed.Aluminum chloride was used for hemostasis. Vaseline and bandage were placed over the wound and wound care instructions were given. Patient demonstrated understanding of the instructions. Patient was advised that it would take approximately 2-3 weeks for the pathology results to be available and that I will personally contact the patient at the number provided to further discuss the pathology results.    3. Inverse psoriasis (see #2 on body diagram)  -We discussed the diagnosis, etiology, natural course and management of this condition.  -Start Triamcinolone 0.1% cream to the body twice daily as needed. We discussed the potential adverse effects of prolonged topical steroid use, including skin atrophy, dyspigmentation, telangiectasias, striae and acne.        The patient was educated on the importance of avoiding excessive sun exposure and wearing sunscreen daily SPF 30 or higher.  Advised patient to re-apply sunscreen every 2-3 hours.  Advised the patient to avoid going to and using tanning beds.  Advised to check skin routinely for any changes, especially any new moles or changes in existing mole      RTC in 6 months or sooner if needed    This is a shared visit with Dr. Prince Rome  Jamas Lav, APRN,FNP-BC  05/24/2019, 16:32    I personally saw and evaluated the patient. See  mid-level's note for additional details. My findings/participation are psoriatic plaques and crusted papule, will start triamcinolone and biopsy lesion on leg.    Mauri [place], MD

## 2019-05-25 ENCOUNTER — Ambulatory Visit (HOSPITAL_COMMUNITY)
Admission: RE | Admit: 2019-05-25 | Discharge: 2019-05-25 | Disposition: A | Discharge status: Still In Hospital | Payer: 59 | Source: Ambulatory Visit | Attending: PHYSICIAN ASSISTANT | Admitting: PHYSICIAN ASSISTANT

## 2019-05-26 LAB — SURGICAL PATHOLOGY SPECIMEN

## 2019-05-29 ENCOUNTER — Ambulatory Visit (HOSPITAL_COMMUNITY): Payer: Self-pay

## 2019-05-29 ENCOUNTER — Ambulatory Visit (HOSPITAL_COMMUNITY)
Admission: RE | Admit: 2019-05-29 | Discharge: 2019-05-29 | Disposition: A | Discharge status: Still In Hospital | Payer: 59 | Source: Ambulatory Visit | Attending: PHYSICIAN ASSISTANT | Admitting: PHYSICIAN ASSISTANT

## 2019-05-29 ENCOUNTER — Encounter (HOSPITAL_BASED_OUTPATIENT_CLINIC_OR_DEPARTMENT_OTHER): Payer: Self-pay | Admitting: Family

## 2019-05-29 ENCOUNTER — Ambulatory Visit: Payer: 59 | Attending: Family | Admitting: Rheumatology

## 2019-05-29 ENCOUNTER — Ambulatory Visit (HOSPITAL_BASED_OUTPATIENT_CLINIC_OR_DEPARTMENT_OTHER): Payer: Self-pay | Admitting: Family

## 2019-05-29 ENCOUNTER — Encounter (HOSPITAL_COMMUNITY): Payer: Self-pay

## 2019-05-29 ENCOUNTER — Other Ambulatory Visit: Payer: Self-pay

## 2019-05-29 DIAGNOSIS — Z952 Presence of prosthetic heart valve: Secondary | ICD-10-CM

## 2019-05-29 DIAGNOSIS — I350 Nonrheumatic aortic (valve) stenosis: Secondary | ICD-10-CM | POA: Insufficient documentation

## 2019-05-29 LAB — PT/INR
INR: 1.83 — ABNORMAL HIGH (ref 0.80–1.20)
PROTHROMBIN TIME: 21.5 s — ABNORMAL HIGH (ref 9.1–13.9)

## 2019-05-29 NOTE — Nursing Note (Signed)
Pt called earlier and stated that her right forth toe was bluish purple. She stated that it was around the first knuckle towards the tip. Denies any numbness or tingling. When asked if it is any different in temperature she replied "they are all cold." Pt denies any type of injury even rubbing it. She also said she has had a headache for a few days. Pt made statement that blue toes could mean COVID. Pt denies any fevers or shortness of breath. Denies any other symptoms other than headache and blue toe. Advised pt that I cannot see her toe so it would be very difficult to give her any recommendations. Advised pt that if this has become concerning to her she can call and see if someone at her PCP's can see her. She does not feel that it warrants and ER visit. Pt to call PCP's office. Pt to notify our office with any changes or updates.     Leone Brand, BSN RN  Specialty Care Nurse, Anticoagulation  [facility]  Ph. [phone]  (ext. [phone])  Fax [phone]  [email or web address]

## 2019-05-29 NOTE — Progress Notes (Signed)
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 No know reason for INR to be lower per patient.   Clinical Outcomes     Positives:   INR Below Goal        Patient Findings     Negatives:   Change in health, Missed doses, Change in medications, Change in diet/appetite        Plan: 6 mg daily. Check INR in 1 week. Confirmed dosing and testing with patient.   Next INR Date: 06/05/2019    Kate Zweifel, BSN       For additional information, see dosing calendar below.    Warfarin Therapy Instructions   July 2020 Details    Sun Mon Tue Wed Thu Fri Sat        1               2               3               4                 5               6               7               8               9               10               11                 12               13               14               15               16               17               18                 19               20   2.58   6 mg   See details      21      4 mg         22      6 mg         23      4 mg         24      6 mg         25      6 mg           26       4 mg  27      6 mg         28      4 mg         29      6 mg         30      4 mg         31      6 mg           Date Details   07/20 Last INR check   INR: 2.58              Warfarin Therapy Instructions   August 2020 Details    Sun Mon Tue Wed Thu Fri Sat           1      6 mg           2      4 mg         3      6 mg         4      4 mg         5      6 mg         6      4 mg         7      6 mg         8      6 mg           9      4 mg         10   1.83   6 mg   See details      11      6 mg         12      6 mg         13      6 mg         14      6 mg         15      6 mg           16      6 mg          17      6 mg         18               19               20               21               22                 23               24               25               26               27               28               29                  30  31                     Date Details   08/10 This INR check   INR: 1.83!       Date of next INR:  06/05/2019

## 2019-05-29 NOTE — Telephone Encounter (Addendum)
Patient called back. She states her right fourth toe turned purple on Saturday. She denies injury or hitting the toe. Denies pain, numbness, tingling to toe and states she can wiggle it. The purple is just under the toenail. States she is on Coumadin since her heart surgery in March. States she has also had a "low grade headache" since Thursday. Denies any other symptoms. She declines need to go to ER. Instructed being on Coumadin she is at increased risk for bleeding. She states understanding. Tried to schedule appointment tomorrow with Dr. Henderson [place] and appointment was taken while I was scheduling. Called patient back and scheduled appointment with her to see Dr. Doy Mince on 8/12 at 10 am. She states understanding. Beryl Meager, RN  05/29/2019, 14:56      Regarding: [place] pt//appt   ----- Message from Reita Chard sent at 05/29/2019  2:37 PM EDT -----  Derek Jack, APRN,FNP-BC    PT states she needs to be seen as soon as she can     She says her toe is purple and she was told to call her primary care     Lillie Columbia does not have an appointment until 8.25.2020 and the pt wants to be seen sooner   Please advise   Thank you

## 2019-05-31 ENCOUNTER — Other Ambulatory Visit: Payer: Self-pay

## 2019-05-31 ENCOUNTER — Ambulatory Visit (HOSPITAL_COMMUNITY)
Admission: RE | Admit: 2019-05-31 | Discharge: 2019-05-31 | Disposition: A | Discharge status: Still In Hospital | Payer: 59 | Source: Ambulatory Visit | Attending: PHYSICIAN ASSISTANT | Admitting: PHYSICIAN ASSISTANT

## 2019-05-31 ENCOUNTER — Encounter (HOSPITAL_BASED_OUTPATIENT_CLINIC_OR_DEPARTMENT_OTHER): Payer: Self-pay

## 2019-06-01 ENCOUNTER — Other Ambulatory Visit (HOSPITAL_COMMUNITY): Payer: Self-pay | Admitting: Nurse Practitioner

## 2019-06-01 ENCOUNTER — Ambulatory Visit (HOSPITAL_COMMUNITY)
Admission: RE | Admit: 2019-06-01 | Discharge: 2019-06-01 | Disposition: A | Discharge status: Still In Hospital | Payer: 59 | Source: Ambulatory Visit | Attending: PHYSICIAN ASSISTANT | Admitting: PHYSICIAN ASSISTANT

## 2019-06-01 MED ORDER — WARFARIN 2 MG TABLET
6.0000 mg | ORAL_TABLET | Freq: Every evening | ORAL | 1 refills | Status: DC
Start: 2019-06-01 — End: 2019-08-03

## 2019-06-05 ENCOUNTER — Ambulatory Visit (HOSPITAL_COMMUNITY)
Admission: RE | Admit: 2019-06-05 | Discharge: 2019-06-05 | Disposition: A | Discharge status: Still In Hospital | Payer: 59 | Source: Ambulatory Visit | Attending: PHYSICIAN ASSISTANT | Admitting: PHYSICIAN ASSISTANT

## 2019-06-05 ENCOUNTER — Ambulatory Visit (HOSPITAL_COMMUNITY): Payer: Self-pay

## 2019-06-05 ENCOUNTER — Other Ambulatory Visit: Payer: Self-pay

## 2019-06-05 ENCOUNTER — Ambulatory Visit: Payer: 59 | Attending: Family | Admitting: Rheumatology

## 2019-06-05 DIAGNOSIS — I350 Nonrheumatic aortic (valve) stenosis: Secondary | ICD-10-CM

## 2019-06-05 DIAGNOSIS — Z952 Presence of prosthetic heart valve: Secondary | ICD-10-CM | POA: Insufficient documentation

## 2019-06-05 LAB — PT/INR
INR: 2.52 — ABNORMAL HIGH (ref 0.80–1.20)
PROTHROMBIN TIME: 29.8 s — ABNORMAL HIGH (ref 9.1–13.9)

## 2019-06-05 NOTE — Progress Notes (Signed)
I discussed the patient's care with the Resident prior to the patient leaving the clinic. Any significant discussion points are noted.    Aaran Enberg, MD 06/05/2019, 07:19

## 2019-06-06 NOTE — Progress Notes (Signed)
I discussed the patient's care with the Resident prior to the patient leaving the clinic. Any significant discussion points are noted.    [name], MD 06/06/2019, 17:02

## 2019-06-06 NOTE — Progress Notes (Signed)
Anticoagulation Encounter Note:    Surgical AVR plan  Warfarin start date: 01-06-2019  HVI Staff: Lacinda Axon / Gamble Enderle   DX : On-X AVR  INR Range: 2.0-3.0  Tab strength: warfarin 2mg   Contact 1: [phone] cell  Contact 2:   Lab: [facility] [place]  Comments:    INR: 2.52    Current Dose: 6 mg daily    Clinical Outcomes     Positives:   INR In Range (INR good. pt called. )          Plan: pt to continue current dosing and recheck INR in one week. Dosing verified by pt.    Next INR Date: 06/12/19    Leone Brand, BSN RN  Specialty Care Nurse, Anticoagulation   [facility]  Ph. [phone]  (ext. [phone])  Fax [phone]  [email or web address]        For additional information, see dosing calendar below.    Warfarin Therapy Instructions   August 2020 Details    Sun [place] Tue Wed Thu Fri Sat           1                 2               3               4               5               6               7               8                 9               10    1.83   6 mg   See details      11      6 mg         12      6 mg         13      6 mg         14      6 mg         15      6 mg           16      6 mg         17   2.52   6 mg   See details      18      6 mg         19      6 mg         20      6 mg         21      6 mg         22      6 mg           23      6 mg         24      6 mg         25               26  27               28               29                 30               31                      Date Details   08/10 Last INR check   INR: 1.83!      08/17 This INR check   INR: 2.52       Date of next INR:  06/12/2019

## 2019-06-07 ENCOUNTER — Other Ambulatory Visit (HOSPITAL_BASED_OUTPATIENT_CLINIC_OR_DEPARTMENT_OTHER): Payer: Self-pay | Admitting: Family

## 2019-06-07 ENCOUNTER — Ambulatory Visit (HOSPITAL_COMMUNITY)
Admission: RE | Admit: 2019-06-07 | Discharge: 2019-06-07 | Disposition: A | Discharge status: Still In Hospital | Payer: 59 | Source: Ambulatory Visit | Attending: PHYSICIAN ASSISTANT | Admitting: PHYSICIAN ASSISTANT

## 2019-06-07 ENCOUNTER — Other Ambulatory Visit: Payer: Self-pay

## 2019-06-07 NOTE — Nursing Note (Signed)
Refill Request Note: [place] requests refill on listed medications.  Last visit at [facility]:  12/20/2018  Next pending visit [facility]: 06/22/2019    Medication pended to provider for approval.    Jeanella Flattery, [facility] 06/07/2019, 11:22

## 2019-06-08 ENCOUNTER — Ambulatory Visit (HOSPITAL_COMMUNITY)
Admission: RE | Admit: 2019-06-08 | Discharge: 2019-06-08 | Disposition: A | Discharge status: Still In Hospital | Payer: 59 | Source: Ambulatory Visit | Attending: PHYSICIAN ASSISTANT | Admitting: PHYSICIAN ASSISTANT

## 2019-06-12 ENCOUNTER — Other Ambulatory Visit: Payer: Self-pay

## 2019-06-12 ENCOUNTER — Ambulatory Visit (HOSPITAL_COMMUNITY): Payer: Self-pay

## 2019-06-12 ENCOUNTER — Ambulatory Visit: Payer: 59 | Attending: Family | Admitting: Rheumatology

## 2019-06-12 ENCOUNTER — Ambulatory Visit (HOSPITAL_COMMUNITY)
Admission: RE | Admit: 2019-06-12 | Discharge: 2019-06-12 | Disposition: A | Discharge status: Still In Hospital | Payer: 59 | Source: Ambulatory Visit | Attending: PHYSICIAN ASSISTANT | Admitting: PHYSICIAN ASSISTANT

## 2019-06-12 DIAGNOSIS — Z952 Presence of prosthetic heart valve: Secondary | ICD-10-CM | POA: Insufficient documentation

## 2019-06-12 DIAGNOSIS — I350 Nonrheumatic aortic (valve) stenosis: Secondary | ICD-10-CM | POA: Insufficient documentation

## 2019-06-12 LAB — PT/INR
INR: 2.27 — ABNORMAL HIGH (ref 0.80–1.20)
PROTHROMBIN TIME: 26.8 s — ABNORMAL HIGH (ref 9.1–13.9)

## 2019-06-12 NOTE — Progress Notes (Signed)
I discussed the patient's care with the Resident prior to the patient leaving the clinic. Any significant discussion points are noted.    [name], MD 06/12/2019, 16:32

## 2019-06-12 NOTE — Progress Notes (Signed)
Anticoagulation Encounter Note:    Surgical AVR plan  Warfarin start date: 01-06-2019  HVI Staff: Lacinda Axon / Petina Muraski   DX : On-X AVR  INR Range: 2.0-3.0  Tab strength: warfarin 2mg   Contact 1: [phone] cell  Contact 2:   Lab: [facility] [place]  Comments:    INR: 2.27  Current Dose: 6 mg daily.   Clinical Outcomes     Positives:   INR In Range          Plan: 6 mg daily. Test INR in 2 weeks (at next cardiac rehab appointment). Confirmed dosing and testing with patient.   Next INR Date: 06/28/2019    Filbert Berthold, BSN       For additional information, see dosing calendar below.    Warfarin Therapy Instructions   August 2020 Details    Sun Mon Tue Wed Thu Fri Sat           1                 2               3               4               5               6               7               8                 9               10               11               12               13               14               15                 16               17    2.52   6 mg   See details      18      6 mg         19      6 mg         20      6 mg         21      6 mg         22      6 mg           23      6 mg         24   2.27   6 mg   See details      25      6 mg         26      6 mg         27      6 mg         28      6 mg  29      6 mg           30      4 mg         31      6 mg               Date Details   08/17 Last INR check   INR: 2.52      08/24 This INR check   INR: 2.27              Warfarin Therapy Instructions   September 2020 Details    Sun Mon Tue Wed Thu Fri Sat       1      4 mg         2      6 mg         3      4 mg         4      6 mg         5      6 mg           6      4 mg         7      6 mg         8      4 mg         9      6 mg         10               11               12                 13               14               15               16               17               18               19                 20               21               22               23               24               25                 26                 27               28               29               30                    Date Details   No additional details  Date of next INR:  06/28/2019

## 2019-06-13 NOTE — Progress Note-[facility] w sigreq (Signed)
I agree to assessment and plan of cardiac rehabilitation staff. Any changes if required have been edited and updated.

## 2019-06-14 ENCOUNTER — Ambulatory Visit
Admission: RE | Admit: 2019-06-14 | Discharge: 2019-06-14 | Disposition: A | Discharge status: Still In Hospital | Payer: 59 | Source: Ambulatory Visit | Attending: Cardiovascular Disease | Admitting: Cardiovascular Disease

## 2019-06-14 ENCOUNTER — Other Ambulatory Visit: Payer: Self-pay

## 2019-06-15 ENCOUNTER — Ambulatory Visit (HOSPITAL_COMMUNITY): Payer: Self-pay

## 2019-06-19 ENCOUNTER — Other Ambulatory Visit: Payer: Self-pay

## 2019-06-19 ENCOUNTER — Ambulatory Visit (HOSPITAL_COMMUNITY)
Admission: RE | Admit: 2019-06-19 | Discharge: 2019-06-19 | Disposition: A | Discharge status: Still In Hospital | Payer: 59 | Source: Ambulatory Visit | Attending: PHYSICIAN ASSISTANT | Admitting: PHYSICIAN ASSISTANT

## 2019-06-19 DIAGNOSIS — Z952 Presence of prosthetic heart valve: Secondary | ICD-10-CM | POA: Insufficient documentation

## 2019-06-19 DIAGNOSIS — I10 Essential (primary) hypertension: Secondary | ICD-10-CM | POA: Insufficient documentation

## 2019-06-19 DIAGNOSIS — Z79899 Other long term (current) drug therapy: Secondary | ICD-10-CM | POA: Insufficient documentation

## 2019-06-19 DIAGNOSIS — Z7901 Long term (current) use of anticoagulants: Secondary | ICD-10-CM | POA: Insufficient documentation

## 2019-06-21 ENCOUNTER — Ambulatory Visit (HOSPITAL_COMMUNITY)
Admission: RE | Admit: 2019-06-21 | Discharge: 2019-06-21 | Disposition: A | Discharge status: Still In Hospital | Payer: 59 | Source: Ambulatory Visit | Attending: PHYSICIAN ASSISTANT | Admitting: PHYSICIAN ASSISTANT

## 2019-06-21 ENCOUNTER — Other Ambulatory Visit: Payer: Self-pay

## 2019-06-22 ENCOUNTER — Ambulatory Visit (HOSPITAL_COMMUNITY)
Admission: RE | Admit: 2019-06-22 | Discharge: 2019-06-22 | Disposition: A | Discharge status: Still In Hospital | Payer: 59 | Source: Ambulatory Visit | Attending: PHYSICIAN ASSISTANT | Admitting: PHYSICIAN ASSISTANT

## 2019-06-22 ENCOUNTER — Encounter (HOSPITAL_BASED_OUTPATIENT_CLINIC_OR_DEPARTMENT_OTHER): Payer: 59 | Admitting: Family

## 2019-06-22 ENCOUNTER — Ambulatory Visit (HOSPITAL_BASED_OUTPATIENT_CLINIC_OR_DEPARTMENT_OTHER): Payer: Self-pay

## 2019-06-28 ENCOUNTER — Ambulatory Visit: Payer: 59 | Attending: Family | Admitting: Rheumatology

## 2019-06-28 ENCOUNTER — Other Ambulatory Visit: Payer: Self-pay

## 2019-06-28 ENCOUNTER — Ambulatory Visit (HOSPITAL_COMMUNITY): Payer: Self-pay

## 2019-06-28 ENCOUNTER — Ambulatory Visit (HOSPITAL_COMMUNITY)
Admission: RE | Admit: 2019-06-28 | Discharge: 2019-06-28 | Disposition: A | Discharge status: Still In Hospital | Payer: 59 | Source: Ambulatory Visit | Attending: PHYSICIAN ASSISTANT | Admitting: PHYSICIAN ASSISTANT

## 2019-06-28 ENCOUNTER — Other Ambulatory Visit (HOSPITAL_COMMUNITY): Payer: Self-pay | Admitting: Nurse Practitioner

## 2019-06-28 DIAGNOSIS — Z952 Presence of prosthetic heart valve: Secondary | ICD-10-CM | POA: Insufficient documentation

## 2019-06-28 DIAGNOSIS — I350 Nonrheumatic aortic (valve) stenosis: Secondary | ICD-10-CM | POA: Insufficient documentation

## 2019-06-28 LAB — PT/INR
INR: 2.62 — ABNORMAL HIGH (ref 0.80–1.20)
PROTHROMBIN TIME: 31 s — ABNORMAL HIGH (ref 9.1–13.9)

## 2019-06-28 NOTE — Progress Notes (Signed)
Anticoagulation Encounter Note:    Surgical AVR plan  Warfarin start date: 01-06-2019  [facility] Staff: Lacinda Axon / Melyssa Signor   DX : On-X AVR  INR Range: 2.0-3.0  Tab strength: warfarin 2mg   Contact 1: [phone] cell  Contact 2:   Lab: [facility]  Comments:    INR: 2.62  Current Dose: 6 mg daily.   Clinical Outcomes     Positives:   INR In Range          Plan: 6 mg daily. Patient felt most comfortable testing in 2 week. Confirmed dosing and testing with patient.   Next INR Date: 07/11/2019    Filbert Berthold, BSN       For additional information, see dosing calendar below.    Warfarin Therapy Instructions   August 2020 Details    Sun Mon Tue Wed Thu Fri Sat           1                 2               3               4               5               6               7               8                 9               10               11               12               13               14               15                 16               17               18               19               20               21               22                 23               24    2.27   6 mg   See details      25      6 mg         26      6 mg         27      6 mg         28      6 mg         29  6 mg           30      6 mg         31      6 mg               Date Details   08/24 Last INR check   INR: 2.27              Warfarin Therapy Instructions   September 2020 Details    Sun Mon Tue Wed Thu Fri Sat       1      6 mg         2      6 mg         3      6 mg         4      6 mg         5      6 mg           6      6 mg         7      6 mg         8      6 mg         9   2.62   6 mg   See details      10      6 mg         11      6 mg         12      6 mg           13      6 mg         14      6 mg         15      6 mg         16      6 mg         17      6 mg         18      6 mg         19      6 mg           20      6 mg         21      6 mg         22      6 mg         23      6 mg         24      6 mg         25      6 mg          26      6 mg           27      6 mg         28      6 mg         29      6 mg         30      6 mg             Date Details   09/09 This INR  check   INR: 2.62              Warfarin Therapy Instructions   October 2020 Details    Sun Mon Tue Wed Thu Fri Sat         1      6 mg         2      6 mg         3      6 mg           4      6 mg         5      6 mg         6      6 mg         7      6 mg         8               9               10                 11               12               13               14               15               16               17                 18               19               20               21               22               23               24                 25               26               27               28               29               30               31                 Date Details   No additional details    Date of next INR:  07/26/2019

## 2019-06-28 NOTE — Progress Notes (Signed)
I discussed the patient's care with the Resident prior to the patient leaving the clinic. Any significant discussion points are noted.    [name] Rutherford, MD 06/28/2019, [id number]

## 2019-06-29 ENCOUNTER — Ambulatory Visit (HOSPITAL_COMMUNITY)
Admission: RE | Admit: 2019-06-29 | Discharge: 2019-06-29 | Disposition: A | Discharge status: Still In Hospital | Payer: 59 | Source: Ambulatory Visit | Attending: PHYSICIAN ASSISTANT | Admitting: PHYSICIAN ASSISTANT

## 2019-07-01 ENCOUNTER — Other Ambulatory Visit: Payer: Self-pay

## 2019-07-03 ENCOUNTER — Other Ambulatory Visit: Payer: Self-pay

## 2019-07-03 ENCOUNTER — Ambulatory Visit (HOSPITAL_COMMUNITY)
Admission: RE | Admit: 2019-07-03 | Discharge: 2019-07-03 | Disposition: A | Discharge status: Still In Hospital | Payer: 59 | Source: Ambulatory Visit | Attending: PHYSICIAN ASSISTANT | Admitting: PHYSICIAN ASSISTANT

## 2019-07-05 ENCOUNTER — Ambulatory Visit (HOSPITAL_COMMUNITY)
Admission: RE | Admit: 2019-07-05 | Discharge: 2019-07-05 | Disposition: A | Discharge status: Still In Hospital | Payer: 59 | Source: Ambulatory Visit | Attending: PHYSICIAN ASSISTANT | Admitting: PHYSICIAN ASSISTANT

## 2019-07-05 ENCOUNTER — Other Ambulatory Visit: Payer: Self-pay

## 2019-07-06 ENCOUNTER — Ambulatory Visit (HOSPITAL_COMMUNITY)
Admission: RE | Admit: 2019-07-06 | Discharge: 2019-07-06 | Disposition: A | Discharge status: Still In Hospital | Payer: 59 | Source: Ambulatory Visit | Attending: PHYSICIAN ASSISTANT | Admitting: PHYSICIAN ASSISTANT

## 2019-07-10 ENCOUNTER — Ambulatory Visit (HOSPITAL_COMMUNITY)
Admission: RE | Admit: 2019-07-10 | Discharge: 2019-07-10 | Disposition: A | Discharge status: Still In Hospital | Payer: 59 | Source: Ambulatory Visit | Attending: PHYSICIAN ASSISTANT | Admitting: PHYSICIAN ASSISTANT

## 2019-07-10 ENCOUNTER — Encounter (HOSPITAL_COMMUNITY): Payer: Self-pay

## 2019-07-11 ENCOUNTER — Encounter (HOSPITAL_BASED_OUTPATIENT_CLINIC_OR_DEPARTMENT_OTHER): Payer: Self-pay

## 2019-07-11 ENCOUNTER — Ambulatory Visit (HOSPITAL_BASED_OUTPATIENT_CLINIC_OR_DEPARTMENT_OTHER): Payer: 59 | Admitting: Family

## 2019-07-11 ENCOUNTER — Other Ambulatory Visit: Payer: Self-pay

## 2019-07-11 ENCOUNTER — Ambulatory Visit (HOSPITAL_BASED_OUTPATIENT_CLINIC_OR_DEPARTMENT_OTHER): Payer: 59 | Admitting: Gynecology

## 2019-07-11 ENCOUNTER — Ambulatory Visit (HOSPITAL_COMMUNITY): Payer: Self-pay

## 2019-07-11 ENCOUNTER — Ambulatory Visit
Admission: RE | Admit: 2019-07-11 | Discharge: 2019-07-11 | Disposition: A | Discharge status: Home / Self Care | Payer: 59 | Source: Ambulatory Visit | Attending: Family | Admitting: Family

## 2019-07-11 ENCOUNTER — Encounter (HOSPITAL_BASED_OUTPATIENT_CLINIC_OR_DEPARTMENT_OTHER): Payer: Self-pay | Admitting: Family

## 2019-07-11 ENCOUNTER — Other Ambulatory Visit (HOSPITAL_COMMUNITY): Payer: Self-pay | Admitting: Family

## 2019-07-11 VITALS — BP 124/84 | HR 73 | Ht 65.5 in | Wt 225.3 lb

## 2019-07-11 DIAGNOSIS — Z23 Encounter for immunization: Secondary | ICD-10-CM | POA: Insufficient documentation

## 2019-07-11 DIAGNOSIS — I350 Nonrheumatic aortic (valve) stenosis: Secondary | ICD-10-CM

## 2019-07-11 DIAGNOSIS — I10 Essential (primary) hypertension: Secondary | ICD-10-CM | POA: Insufficient documentation

## 2019-07-11 DIAGNOSIS — Z1231 Encounter for screening mammogram for malignant neoplasm of breast: Secondary | ICD-10-CM | POA: Insufficient documentation

## 2019-07-11 DIAGNOSIS — Z79899 Other long term (current) drug therapy: Secondary | ICD-10-CM | POA: Insufficient documentation

## 2019-07-11 DIAGNOSIS — Z952 Presence of prosthetic heart valve: Secondary | ICD-10-CM

## 2019-07-11 DIAGNOSIS — Z1239 Encounter for other screening for malignant neoplasm of breast: Secondary | ICD-10-CM

## 2019-07-11 DIAGNOSIS — Z7901 Long term (current) use of anticoagulants: Secondary | ICD-10-CM | POA: Insufficient documentation

## 2019-07-11 LAB — PT/INR
INR: 3.18 — ABNORMAL HIGH (ref 0.80–1.20)
PROTHROMBIN TIME: 37.9 s — ABNORMAL HIGH (ref 9.1–13.9)

## 2019-07-11 MED ORDER — ASPIRIN 81 MG CHEWABLE TABLET
81.0000 mg | CHEWABLE_TABLET | Freq: Every day | ORAL | 3 refills | Status: DC
Start: 2019-07-11 — End: 2020-08-09

## 2019-07-11 NOTE — Progress Notes (Signed)
Anticoagulation Encounter Note:    Surgical AVR plan  Warfarin start date: 01-06-2019  [facility] Staff: Lacinda Axon / Zyier Dykema   DX : On-X AVR  INR Range: 2.0-3.0  Tab strength: warfarin 2mg   Contact 1: [phone] cell  Contact 2:   Lab: [facility]  Comments: Patient's insurance changes at end of the month.     INR: 3.18  Current Dose: 6 mg daily.   Clinical Outcomes     Positives:   INR In Range          Plan: 4 mg Tuesday; 6 mg all other days. Confirmed dosing and testing with patient.    Next INR Date: 07/25/2019    Filbert Berthold, BSN     For additional information, see dosing calendar below.    Warfarin Therapy Instructions   September 2020 Details    Sun Mon Tue Wed Thu Fri Sat       1               2               3               4               5                 6               7               8               9    2.62   6 mg   See details      10      6 mg         11      6 mg         12      6 mg           13      6 mg         14      6 mg         15      6 mg         16      6 mg         17      6 mg         18      6 mg         19      6 mg           20      6 mg         21      6 mg         22   3.18   4 mg   See details      23      6 mg         24      6 mg         25      6 mg         26      6 mg           27      6 mg         28  6 mg         29      4 mg         30      6 mg             Date Details   09/09 Last INR check   INR: 2.62      09/22 This INR check   INR: 3.18!              Warfarin Therapy Instructions   October 2020 Details    Sun Mon Tue Wed Thu Fri Sat         1      6 mg         2      6 mg         3      6 mg           4      6 mg         5      6 mg         6      4 mg         7               8               9               10                 11               12               13               14               15               16               17                 18               19               20               21               22               23               24                    25               26               27               28               29               30               31                 Date Details   No additional details  Date of next INR:  07/25/2019

## 2019-07-11 NOTE — Progress Notes (Signed)
Subjective:   [name] is a 63 y.o. female with hypertension.  Hypertension treatment  Metoprolol 50 mg.  Pertinent history:taking medications as instructed, no medication side effects noted, no TIAs, no chest pain on exertion, no dyspnea on exertion, no swelling of ankles  S/p AVR in March 2020- doing well.     Lab Results   Component Value Date    SODIUM 135 (L) 01/25/2019    POTASSIUM 4.1 01/25/2019    CHLORIDE 102 01/25/2019    CO2 29 01/25/2019    ANIONGAP 4 01/25/2019    BUN 17 01/25/2019    CREATININE 0.82 01/25/2019    BUNCRRATIO 21 01/25/2019    GFR >60 01/25/2019    GLUCOSENF 87 01/25/2019    CHOLESTEROL 195 11/21/2018    HDLCHOL 73 11/21/2018    LDLCHOL 106 (H) 11/21/2018    TRIG 78 11/21/2018     Medication list:   acetaminophen (TYLENOL) 500 mg Oral Tablet, Take 500 mg by mouth Every 4 hours as needed for Pain  amiodarone (PACERONE) 200 mg Oral Tablet, Take 2 Tabs (400 mg total) by mouth Twice daily for 14 doses (Patient not taking: Reported on 02/02/2019)  amiodarone (PACERONE) 200 mg Oral Tablet, Take 2 Tabs (400 mg total) by mouth Once a day for 7 doses (Patient not taking: Reported on 02/02/2019)  amiodarone (PACERONE) 200 mg Oral Tablet, Take 1 Tab (200 mg total) by mouth Once a day for 7 doses (Patient not taking: Reported on 02/02/2019)  aspirin 81 mg Oral Tablet, Chewable, Take 1 Tab (81 mg total) by mouth Once a day  clobetasoL (CORMAX) 0.05 % Solution, by Apply Topically route Twice daily  clobetasoL (TEMOVATE) 0.05 % Cream, by Apply Topically route Twice daily  fluticasone propionate (FLONASE) 50 mcg/actuation Nasal Spray, Suspension, 1 Spray by Each Nostril route Once a day  loratadine (CLARITIN) 10 mg Oral Tablet, Take 10 mg by mouth Once a day  metoprolol tartrate (LOPRESSOR) 50 mg Oral Tablet, TAKE 1 TABLET (50MG ) BY MOUTH TWICE DAILY  multivitamin Oral Tablet, Take 1 Tab by mouth Once a day  triamcinolone acetonide (ARISTOCORT A) 0.1 % Cream, Apply topically Twice daily  warfarin  (COUMADIN) 2 mg Oral Tablet, Take 3 Tabs (6 mg total) by mouth Every evening    No facility-administered medications prior to visit.      Review of Systems - as per HPI.     OBJECTIVE:  BP 124/84   Pulse 73   Ht 1.664 m (5' 5.5")   Wt 102 kg (225 lb 5 oz)   SpO2 97%   BMI 36.92 kg/m      Body mass index is 36.92 kg/m.  BP Readings from Last 3 Encounters:   07/11/19 124/84   02/02/19 130/85   02/02/19 130/85     Wt Readings from Last 3 Encounters:   07/11/19 102 kg (225 lb 5 oz)   05/24/19 101 kg (223 lb 5.2 oz)   02/02/19 96.4 kg (212 lb 8.4 oz)     General appearance: in no apparent distress and well developed and well nourished  General exam BP noted to be well controlled today in office, S1, S2 normal, no gallop, no murmur, chest clear, no JVD, no HSM, no edema.     ASSESSMENT:  PLAN:    ICD-10-CM    1. Essential hypertension  I10    2. Need for influenza vaccination  Z23 Influenza Vaccine IM Age 31mo through Adult 0.5ML(admin)   3. S/P AVR  Z95.2    s/p AVR- see's cardiology, they follow INR's as well.   HTN: controlled today and tolerates medication well. No change.   Will schedule Shingrix visit separate from flu shot today.   Return in about 6 months (around 01/08/2020) for welcome to medicare visit.   Patient was seen independently,  physician on site if consultation was needed.    Derek Jack, APRN,FNP-BC  [facility]ILY [facility]IC[facility], [facility]  Operated by [facility]  [address]  [place] [address]  Dept: [phone]

## 2019-07-11 NOTE — Nursing Note (Signed)
1. Are you 64 years of age or older? yes  2. Have you ever had a severe reaction to a flu shot? no  3. Are you severely allergic to eggs, gentamicin, gelatin, arginine chicken feathers, or other vaccinations?no  4. Are you allergic to latex? no  5. Are you allergic to Thimerosol? no  6. Are you experiencing acute illness symptoms or have you been running a fever? no  7. Do you have a medical condition or taking medications that suppress your immune system? no  8. Have you ever had Guillain-Barre syndrome or other neurologic disorder? no  9. Are you pregnant or breastfeeding? no  10. Are you receiving aspirin or aspirin containing therapy? yes        WHO MUST GET A SECOND DOSE  *If answer to question 1 is no, and the child is under 32 years of age, please ask your nurse/provider about the need for a second dose if applicable.    Immunization administered     Name Date Dose VIS Date Route    INFLUENZA VACCINE IM 07/11/2019 0.5 mL 06/02/2018 Intramuscular    Site: Left deltoid    Given By: Kathrene Bongo, MA    Manufacturer: [employer]    Lot: [id number]    [id number]

## 2019-07-12 ENCOUNTER — Ambulatory Visit (HOSPITAL_COMMUNITY)
Admission: RE | Admit: 2019-07-12 | Discharge: 2019-07-12 | Disposition: A | Discharge status: Still In Hospital | Payer: 59 | Source: Ambulatory Visit

## 2019-07-12 NOTE — Progress Notes (Signed)
I discussed the patient's care with the Resident prior to the patient leaving the clinic. Any significant discussion points are noted.    [name], MD 07/12/2019, 08:36

## 2019-07-12 NOTE — Progress Note-[facility] w sigreq (Signed)
I agree to assessment and plan of cardiac rehabilitation staff. Any changes if required have been edited and updated.

## 2019-07-13 ENCOUNTER — Ambulatory Visit
Admission: RE | Admit: 2019-07-13 | Discharge: 2019-07-13 | Disposition: A | Discharge status: Still In Hospital | Payer: 59 | Source: Ambulatory Visit | Attending: Cardiovascular Disease | Admitting: Cardiovascular Disease

## 2019-07-13 ENCOUNTER — Other Ambulatory Visit: Payer: Self-pay

## 2019-07-17 ENCOUNTER — Ambulatory Visit (HOSPITAL_COMMUNITY)
Admission: RE | Admit: 2019-07-17 | Discharge: 2019-07-17 | Disposition: A | Discharge status: Still In Hospital | Payer: Medicaid Other | Source: Ambulatory Visit

## 2019-07-17 ENCOUNTER — Other Ambulatory Visit: Payer: Self-pay

## 2019-07-17 DIAGNOSIS — Z79899 Other long term (current) drug therapy: Secondary | ICD-10-CM | POA: Insufficient documentation

## 2019-07-17 DIAGNOSIS — I10 Essential (primary) hypertension: Secondary | ICD-10-CM | POA: Insufficient documentation

## 2019-07-17 DIAGNOSIS — Z7901 Long term (current) use of anticoagulants: Secondary | ICD-10-CM | POA: Insufficient documentation

## 2019-07-17 DIAGNOSIS — Z952 Presence of prosthetic heart valve: Secondary | ICD-10-CM | POA: Insufficient documentation

## 2019-07-19 ENCOUNTER — Ambulatory Visit
Admission: RE | Admit: 2019-07-19 | Discharge: 2019-07-19 | Disposition: A | Discharge status: Still In Hospital | Payer: Medicaid Other | Source: Ambulatory Visit | Attending: Cardiovascular Disease | Admitting: Cardiovascular Disease

## 2019-07-19 ENCOUNTER — Other Ambulatory Visit: Payer: Self-pay

## 2019-07-24 NOTE — Progress Note-[facility] w sigreq (Signed)
I agree to assessment and plan of cardiac rehabilitation staff. Any changes if required have been edited and updated.

## 2019-07-26 ENCOUNTER — Other Ambulatory Visit (HOSPITAL_COMMUNITY): Payer: Self-pay | Admitting: Nurse Practitioner

## 2019-07-26 ENCOUNTER — Ambulatory Visit (HOSPITAL_COMMUNITY): Payer: Self-pay

## 2019-07-26 ENCOUNTER — Ambulatory Visit: Payer: Medicaid Other | Attending: Family | Admitting: Gynecology

## 2019-07-26 DIAGNOSIS — Z952 Presence of prosthetic heart valve: Secondary | ICD-10-CM | POA: Insufficient documentation

## 2019-07-26 DIAGNOSIS — I350 Nonrheumatic aortic (valve) stenosis: Secondary | ICD-10-CM | POA: Insufficient documentation

## 2019-07-26 LAB — PT/INR
INR: 3.52 — ABNORMAL HIGH (ref 0.80–1.20)
PROTHROMBIN TIME: 42.1 s — ABNORMAL HIGH (ref 9.1–13.9)

## 2019-07-26 NOTE — Progress Notes (Signed)
I discussed the patient's care with the Resident prior to the patient leaving the clinic. Any significant discussion points are noted.    [name] Rutherford, MD 07/26/2019, 16:18

## 2019-07-26 NOTE — Progress Notes (Signed)
Anticoagulation Encounter Note:    Surgical AVR plan  Warfarin start date: 01-06-2019  [facility] Staff: Lacinda Axon / Daniya Aramburo   DX : On-X AVR  INR Range: 2.0-3.0  Tab strength: warfarin 2mg   Contact 1: [phone] cell  Contact 2:   Lab: [facility]  Comments: Tylenol regularly    INR: 3.52  Current Dose: 4 mg Tuesday; 6 mg all other days. No known changes to cause increase in INR per patient.   Clinical Outcomes     Positives:   INR Above Goal        Patient Findings     Positives:   Change in health (looser stool)    Negatives:   Signs/symptoms of bleeding, Change in activity, Extra doses, Change in medications, Change in diet/appetite, Bruising        Plan: 4 mg Wednesday (10/07); 4 mg Thursday and Tuesday; 6 mg all other days. Test INR in 2 weeks at appointment. Confirmed dosing and testing per patient.   Next INR Date: 1020/2020    Filbert Berthold, BSN       For additional information, see dosing calendar below.    Warfarin Therapy Instructions   September 2020 Details    Sun Mon Tue Wed Thu Fri Sat       1               2               3               4               5                 6               7               8               9               10               11               12                 13               14               15               16               17               18               19                 20               21               22    3.18   4 mg   See details      23      6 mg         24      6 mg         25      6 mg  26      6 mg           27      6 mg         28      6 mg         29      4 mg         30      6 mg             Date Details   09/22 Last INR check   INR: 3.18!              Warfarin Therapy Instructions   October 2020 Details    Sun Mon Tue Wed Thu Fri Sat         1      6 mg         2      6 mg         3      6 mg           4      6 mg         5      6 mg         6      4 mg         7   3.52   4 mg   See details      8      6 mg         9      4 mg         10      6 mg           11       6 mg         12      6 mg         13      4 mg         14      6 mg         15      6 mg         16      4 mg         17      6 mg           18      6 mg         19      6 mg         20      6 mg         21               22               23               24                 25               26               27               28                29  30               31                Date Details   10/07 This INR check   INR: 3.52!       Date of next INR:  08/08/2019

## 2019-07-31 ENCOUNTER — Other Ambulatory Visit (HOSPITAL_COMMUNITY): Payer: Self-pay | Admitting: Nurse Practitioner

## 2019-08-03 ENCOUNTER — Other Ambulatory Visit (HOSPITAL_COMMUNITY): Payer: Self-pay | Admitting: Nurse Practitioner

## 2019-08-03 MED ORDER — WARFARIN 2 MG TABLET
6.0000 mg | ORAL_TABLET | Freq: Every evening | ORAL | 1 refills | Status: DC
Start: 2019-08-03 — End: 2019-08-08

## 2019-08-08 ENCOUNTER — Ambulatory Visit: Payer: Medicaid Other | Attending: Cardiovascular Disease | Admitting: Cardiovascular Disease

## 2019-08-08 ENCOUNTER — Ambulatory Visit (INDEPENDENT_AMBULATORY_CARE_PROVIDER_SITE_OTHER): Payer: Medicaid Other | Admitting: Rheumatology

## 2019-08-08 ENCOUNTER — Encounter (HOSPITAL_COMMUNITY): Payer: Self-pay | Admitting: Cardiovascular Disease

## 2019-08-08 ENCOUNTER — Ambulatory Visit (HOSPITAL_COMMUNITY): Payer: Self-pay

## 2019-08-08 ENCOUNTER — Other Ambulatory Visit: Payer: Self-pay

## 2019-08-08 VITALS — BP 135/88 | HR 77 | Ht 65.0 in | Wt 229.5 lb

## 2019-08-08 DIAGNOSIS — Z952 Presence of prosthetic heart valve: Secondary | ICD-10-CM

## 2019-08-08 DIAGNOSIS — I350 Nonrheumatic aortic (valve) stenosis: Secondary | ICD-10-CM

## 2019-08-08 DIAGNOSIS — Z6838 Body mass index (BMI) 38.0-38.9, adult: Secondary | ICD-10-CM

## 2019-08-08 LAB — PT/INR
INR: 3.18 — ABNORMAL HIGH (ref 0.80–1.20)
PROTHROMBIN TIME: 37.9 s — ABNORMAL HIGH (ref 9.1–13.9)

## 2019-08-08 MED ORDER — WARFARIN 2 MG TABLET
6.00 mg | ORAL_TABLET | Freq: Every evening | ORAL | 11 refills | Status: DC
Start: 2019-08-08 — End: 2020-01-01

## 2019-08-08 NOTE — Progress Notes (Signed)
Reason for Visit:    1. S/P AVR        History of Present Illness  [name] had an aortic valve replacement in March of 2020. Since that time she has had no issues.  Today she has multiple questions regarding the possibility of a hip surgery and/or colonoscopy particularly regarding the timing of holding the anticoagulation.  I let her know that if she has to hold her Coumadin we would have to bridge with Lovenox and that the timing is dependent on the recommendation of the particular procedure she is having in the recommendations of the performing physician.  Irrespective of the type of procedure she is contemplating we would help manage the bridging of Lovenox.  She denies any sort of cardiac symptoms at this time.  She completed cardiac rehab.  Due to inactivity she has gained weight but she does not have peripheral edema.  Her physical exam is unremarkable.  She also tells me that on occasion when she wakes up she feels like her left arm is swollen however it does not appear to look any different it is has the feeling of swelling.  All of her postoperative cardiovascular assessments were normal.  We also discussed who will be prescribing her medications and I assured her that we would be taking care of refilling her prescriptions.  Apparently there was some delay in her getting her Coumadin last time so I went ahead today and prescribed the Coumadin pills which are still being controlled modified by the [facility] Clinic but at this rate she will at least have the medication on hand.  Interval Past Medical History  Past Medical History:   Diagnosis Date   . 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 Aortic stenosis    . Arthritis    . Blood in stool     positive cologuard.  Has not yet scheduled colonoscopy   . Cancer (CMS HCC)     skin cancer L lip.  Needs MOHS to be scheduled   . Chronic pain    . Colon polyp    . Dyspnea on exertion     with 2 flights   . Exercise intolerance    .  Heart murmur     echo 02/2016, aortic stenosis   . Heartburn    . High blood pressure    . Motion sickness    . Nausea with vomiting    . Obesity    . Rash     scalp psorriais and involves R ear   . Valvular disease     aortic valve stenosis, following with CT surgery, echo 02/2016 and cath 03/2016   . Wears glasses            Medications  Outpatient Medications Marked as Taking for the 08/08/19 encounter (Office Visit) with Joaquin Bend, MD   Medication Sig Dispense Refill   . acetaminophen (TYLENOL) 500 mg Oral Tablet Take 500 mg by mouth Every 4 hours as needed for Pain     . aspirin 81 mg Oral Tablet, Chewable Take 1 Tab (81 mg total) by mouth Once a day 90 Tab 3   . clobetasoL (CORMAX) 0.05 % Solution by Apply Topically route Twice daily 1 Bottle 1   . clobetasoL (TEMOVATE) 0.05 % Cream by Apply Topically route Twice daily 1 Tube 1   . fluticasone propionate (FLONASE) 50 mcg/actuation Nasal Spray, Suspension 1 Spray by Each Nostril route Once  a day 3 Bottle 0   . loratadine (CLARITIN) 10 mg Oral Tablet Take 10 mg by mouth Once a day     . metoprolol tartrate (LOPRESSOR) 50 mg Oral Tablet TAKE 1 TABLET (50MG ) BY MOUTH TWICE DAILY 180 Tab 2   . multivitamin Oral Tablet Take 1 Tab by mouth Once a day     . triamcinolone acetonide (ARISTOCORT A) 0.1 % Cream Apply topically Twice daily 80 g 3   . warfarin (COUMADIN) 2 mg Oral Tablet Take 3 Tabs (6 mg total) by mouth Every evening 30 Tab 11     Allergies  Allergies   Allergen Reactions   . Iv Contrast Anaphylaxis   . Oregano      Hives     . Stadol [Butorphanol Tartrate] Mental Status Effect   . Tomato Hives/ Urticaria     Family History  Family Medical History:     Problem Relation (Age of Onset)    Atrial fibrillation Sister    Heart Disease Mother    Hypertension (High Blood Pressure) Father    Lung Cancer Father, Brother    Mitral valve prolapse Sister    Sudden Death no cause Mother            Social History  Social History     Tobacco Use   . Smoking status:  Former Smoker     Packs/day: 1.00     Years: 35.00     Pack years: 35.00     Types: Cigarettes     Last attempt to quit: 2014     Years since quitting: 6.8   . Smokeless tobacco: Never Used   Substance Use Topics   . Alcohol use: Yes     Frequency: Monthly or less     Comment: social only     Surg HX:  Past Surgical History:   Procedure Laterality Date   . HX CESAREAN SECTION  1984   . HX HEART CATHETERIZATION      x2 with no intervention per pt report   . HX HERNIA REPAIR  2004, 2006, 2008    x3   . HX HIP REPLACEMENT Left 08/11/2016   . HX HYSTERECTOMY  2003   . HX OOPHORECTOMY      1 ovary         Review of Systems  Full ROS performed and is negative except as per HPI.      Examination:  BP 135/88   Pulse 77   Ht 1.651 m (5\' 5" )   Wt 104 kg (229 lb 8 oz)   SpO2 93%   BMI 38.19 kg/m       General: appears in good health  Eyes: Conjunctiva clear.  HENT:Mouth mucous membranes moist.   Respiratory: Clear to auscultation bilaterally.   Cardiovascular: regular rate and rhythm  GI: Soft, non-tender  Musculoskeletal: extremities normal, atraumatic, no cyanosis or edema  Neurologic: Grossly normal      Data reviewed:  Echo:  New echocardiogram ordered for 01/06/2020  Angiography:  Not applicable  Labs:  INR pending  EKG:  Unremarkable    Assessment and Plan  1. S/P AVR      Very pleasant patient who underwent on X aortic valve replacement.  She has no symptomatology.  She would be due for follow-up echo in March of 2021. She will continue aspirin and warfarin.  Should she require any elective procedures such as colonoscopy or hip surgery we would coordinate with the performing physician  the timing of discontinuation of Coumadin and Lovenox bridging.  I will follow up the patient in 6 months or sooner as needed.      Joaquin Bend, MD   Division of Cardiology  [facility]  08/08/2019 14:06

## 2019-08-08 NOTE — Progress Notes (Signed)
I discussed the patient's care with the Resident prior to the patient leaving the clinic. Any significant discussion points are noted.    [name] Rutherford, MD 08/08/2019, 17:38

## 2019-08-08 NOTE — Progress Notes (Signed)
Anticoagulation Encounter Note:    Surgical AVR plan  Warfarin start date: 01-06-2019  [facility] Staff: Lacinda Axon / Daggubati   DX : On-X AVR  INR Range: 2.0-3.0  Tab strength: warfarin 2mg   Contact 1: [phone] cell  Contact 2:   Lab: [facility]  Comments: Tylenol regularly    INR: 3.18  Current Dose: 4 mg Wednesday 10/07, 4 mg Tuesdays and Friday; 6 mg all other days.   Clinical Outcomes     Positives:   INR In Range          Plan: 4 mg Tuesday and Friday; 6 mg all other days. Test INR in 2 weeks. Confirmed dosing and testing with patient.   Next INR Date: 08/22/2019    Filbert Berthold, BSN       For additional information, see dosing calendar below.    Warfarin Therapy Instructions   October 2020 Details    Sun Mon Tue Wed Thu Fri Sat         1               2               3                 4               5               6               7    3.52   4 mg   See details      8      6 mg         9      4 mg         10      6 mg           11      6 mg         12      6 mg         13      4 mg         14      6 mg         15      6 mg         16      4 mg         17      6 mg           18      6 mg         19      6 mg         20   3.18   4 mg   See details      21      6 mg         22      6 mg         23      4 mg         24      6 mg           25      6 mg         26      6 mg         27      4 mg  28      6 mg         29      6 mg         30      4 mg         31      6 mg          Date Details   10/07 Last INR check   INR: 3.52!      10/20 This INR check   INR: 3.18!              Warfarin Therapy Instructions   November 2020 Details    Sun Mon Tue Wed Thu Fri Sat     1      6 mg         2      6 mg         3      4 mg         4               5               6               7                 8               9               10               11               12               13               14                 15               16               17               18               19               20               21                    22               23               24               25               26               27               28                 29               30                      Date Details   No  additional details    Date of next INR:  08/22/2019

## 2019-08-09 ENCOUNTER — Encounter (HOSPITAL_COMMUNITY): Payer: Self-pay | Admitting: Interventional Cardiology

## 2019-08-22 ENCOUNTER — Ambulatory Visit: Payer: Medicaid Other | Attending: Family | Admitting: Gynecology

## 2019-08-22 ENCOUNTER — Ambulatory Visit (HOSPITAL_COMMUNITY): Payer: Self-pay

## 2019-08-22 DIAGNOSIS — I350 Nonrheumatic aortic (valve) stenosis: Secondary | ICD-10-CM | POA: Insufficient documentation

## 2019-08-22 DIAGNOSIS — Z952 Presence of prosthetic heart valve: Secondary | ICD-10-CM | POA: Insufficient documentation

## 2019-08-22 LAB — PT/INR
INR: 2.33 — ABNORMAL HIGH (ref 0.80–1.20)
PROTHROMBIN TIME: 27.5 s — ABNORMAL HIGH (ref 9.1–13.9)

## 2019-08-22 NOTE — Progress Notes (Signed)
Anticoagulation Encounter Note:    Surgical AVR plan  Warfarin start date: 01-06-2019  [facility] Staff: Earlie Raveling  DX : On-X AVR  INR Range: 2.0-3.0  Tab strength: warfarin 2mg   Contact 1: [phone] cell  Contact 2:   Lab: [facility]  Comments: Tylenol regularly    INR: 2.33  Current Dose: 4 mg Tuesday and Friday; 6 mg all other days.   Clinical Outcomes     Positives:   INR In Range          Plan: 4 mg Tuesday and Friday; 6 mg all other days. Test INR in 2 weeks. Confirmed dosing and testing with patient.   Next INR Date: 09/05/2019    Filbert Berthold, BSN       For additional information, see dosing calendar below.    Warfarin Therapy Instructions   October 2020 Details    Sun Mon Tue Wed Thu Fri Sat         1               2               3                 4               5               6               7               8               9               10                 11               12               13               14               15               16               17                 18               19               20    3.18   4 mg   See details      21      6 mg         22      6 mg         23      4 mg         24      6 mg           25      6 mg         26      6 mg         27      4 mg         28      6 mg  29      6 mg         30      4 mg         31      6 mg          Date Details   10/20 Last INR check   INR: 3.18!              Warfarin Therapy Instructions   November 2020 Details    Sun [place] Tue Wed Thu Fri Sat     1      6 mg         2      6 mg         3   2.33   4 mg   See details      4      6 mg         5      6 mg         6      4 mg         7      6 mg           8      6 mg         9      6 mg         10      4 mg         11      6 mg         12      6 mg         13      4 mg         14      6 mg           15      6 mg         16      6 mg         17      4 mg         18               19               20               21                 22               23               24               25                  26               27               28                 29               30                      Date Details   11/03 This INR check   INR: 2.33       Date  of next INR:  09/05/2019

## 2019-09-04 ENCOUNTER — Other Ambulatory Visit (HOSPITAL_BASED_OUTPATIENT_CLINIC_OR_DEPARTMENT_OTHER): Payer: Self-pay | Admitting: Family

## 2019-09-04 DIAGNOSIS — L409 Psoriasis, unspecified: Secondary | ICD-10-CM

## 2019-09-04 NOTE — Telephone Encounter (Signed)
Refill Request Note: [name] requests refill on Clobetasol.  Last visit at [facility]:  07/11/2019  Next pending visit [facility]: 01/09/2020    Medication pended to provider for approval.    Wesley Desanctis, RN 09/04/2019, 11:47

## 2019-09-05 ENCOUNTER — Ambulatory Visit: Payer: Medicaid Other | Attending: Family | Admitting: Gynecology

## 2019-09-05 ENCOUNTER — Ambulatory Visit (HOSPITAL_COMMUNITY): Payer: Self-pay

## 2019-09-05 DIAGNOSIS — Z952 Presence of prosthetic heart valve: Secondary | ICD-10-CM | POA: Insufficient documentation

## 2019-09-05 DIAGNOSIS — I350 Nonrheumatic aortic (valve) stenosis: Secondary | ICD-10-CM | POA: Insufficient documentation

## 2019-09-05 LAB — PT/INR
INR: 2.64 — ABNORMAL HIGH (ref 0.80–1.20)
PROTHROMBIN TIME: 31.3 s — ABNORMAL HIGH (ref 9.1–13.9)

## 2019-09-05 NOTE — Progress Notes (Signed)
Anticoagulation Encounter Note:    Surgical AVR plan  Warfarin start date: 01-06-2019  [facility] Staff: Earlie Raveling  DX : On-X AVR  INR Range: 2.0-3.0  Tab strength: warfarin 2mg   Contact 1: [phone] cell  Contact 2:   Lab: POC/[place]  Comments: Tylenol regularly    INR: 2.64  Current Dose: 4 mg Tuesday and Friday; 6 mg all other days.   Clinical Outcomes     Positives:  INR In Range          Plan: 4 mg Tuesday and Friday; 6 mg all other days. Test INR in 1 month. Confirmed dosing and testing with patient.   Next INR Date: 10/03/2019    Filbert Berthold, BSN       For additional information, see dosing calendar below.    Warfarin Therapy Instructions   November 2020 Details    Sun Mon Tue Wed Thu Fri Sat     1               2               3    2.33   4 mg   See details      4      6 mg         5      6 mg         6      4 mg         7      6 mg           8      6 mg         9      6 mg         10      4 mg         11      6 mg         12      6 mg         13      4 mg         14      6 mg           15      6 mg         16      6 mg         17   2.64   4 mg   See details      18      6 mg         19      6 mg         20      4 mg         21      6 mg           22      6 mg         23      6 mg         24      4 mg         25      6 mg         26      6 mg         27      4 mg         28  6 mg           29      6 mg         30      6 mg               Date Details   11/03 Last INR check   INR: 2.33      11/17 This INR check   INR: 2.64              Warfarin Therapy Instructions   December 2020 Details    Sun Mon Tue Wed Thu Fri Sat       1      4 mg         2      6 mg         3      6 mg         4      4 mg         5      6 mg           6      6 mg         7      6 mg         8      4 mg         9      6 mg         10      6 mg         11      4 mg         12      6 mg           13      6 mg         14      6 mg         15      4 mg         16               17               18               19                 20             21               22               23               24               25               26                 27               28               29               30                31  Date Details   No additional details    Date of next INR:  10/03/2019

## 2019-09-29 ENCOUNTER — Other Ambulatory Visit (HOSPITAL_BASED_OUTPATIENT_CLINIC_OR_DEPARTMENT_OTHER): Payer: Self-pay | Admitting: Family

## 2019-10-03 ENCOUNTER — Ambulatory Visit: Payer: Medicaid Other | Attending: Family | Admitting: Gynecology

## 2019-10-03 ENCOUNTER — Ambulatory Visit (HOSPITAL_COMMUNITY): Payer: Self-pay

## 2019-10-03 DIAGNOSIS — I350 Nonrheumatic aortic (valve) stenosis: Secondary | ICD-10-CM | POA: Insufficient documentation

## 2019-10-03 DIAGNOSIS — Z952 Presence of prosthetic heart valve: Secondary | ICD-10-CM | POA: Insufficient documentation

## 2019-10-03 LAB — PT/INR
INR: 2.53 — ABNORMAL HIGH (ref 0.80–1.20)
PROTHROMBIN TIME: 29.9 s — ABNORMAL HIGH (ref 9.1–13.9)

## 2019-10-03 NOTE — Progress Notes (Signed)
Anticoagulation Encounter Note:    Surgical AVR plan  Warfarin start date: 01-06-2019  [facility] Staff: Earlie Raveling  DX : On-X AVR  INR Range: 2.0-3.0  Tab strength: warfarin 2mg   Contact 1: [phone] cell  Contact 2:   Lab: POC/[place]  Comments: Tylenol regularly    INR: 2.53  Current Dose: 4 mg Tuesday and Friday; 6 mg all other days.   Clinical Outcomes     Positives:  INR In Range          Plan: 4 mg Tuesday and Friday; 6 mg all other days. Test INR in 1 month.  Left message with dosing and testing instructions. Instructed patient to call back with changes, questions or concerns.    Next INR Date: 10/31/2019    Filbert Berthold, BSN       For additional information, see dosing calendar below.    Warfarin Therapy Instructions   November 2020 Details    Sun Mon Tue Wed Thu Fri Sat     1               2               3               4               5               6               7                 8               9               10               11               12               13               14                 15               16               17    2.64   4 mg   See details      18      6 mg         19      6 mg         20      4 mg         21      6 mg           22      6 mg         23      6 mg         24      4 mg         25      6 mg         26      6 mg         27      4 mg         28  6 mg           29      6 mg         30      6 mg               Date Details   11/17 Last INR check   INR: 2.64              Warfarin Therapy Instructions   December 2020 Details    Sun Mon Tue Wed Thu Fri Sat       1      4 mg         2      6 mg         3      6 mg         4      4 mg         5      6 mg           6      6 mg         7      6 mg         8      4 mg         9      6 mg         10      6 mg         11      4 mg         12      6 mg           13      6 mg         14      6 mg         15   2.53   4 mg   See details      16      6 mg         17      6 mg         18      4 mg         19      6 mg           20      6 mg         21      6 mg         22      4 mg         23      6 mg         24      6 mg         25      4 mg         26      6 mg           27      6 mg         28      6 mg         29      4 mg         30      6 mg         31      6 mg  Date Details   12/15 This INR check   INR: 2.53              Warfarin Therapy Instructions   January 2021 Details    Sun [name] Thu Fri Sat          1      4 mg         2      6 mg           3      6 mg         4      6 mg         5      4 mg         6      6 mg         7      6 mg         8      4 mg         9      6 mg           10      6 mg         11      6 mg         12      4 mg         13               14               15               16                 17               18               19               20               21               22               23                 24               25               26               27               28               29               30                 31                       Date Details   No additional details    Date of next INR:  10/31/2019

## 2019-10-31 ENCOUNTER — Other Ambulatory Visit: Payer: Self-pay

## 2019-11-01 ENCOUNTER — Ambulatory Visit: Payer: Medicare Other | Attending: Family | Admitting: Gynecology

## 2019-11-01 ENCOUNTER — Ambulatory Visit (HOSPITAL_COMMUNITY): Payer: Self-pay

## 2019-11-01 DIAGNOSIS — Z952 Presence of prosthetic heart valve: Secondary | ICD-10-CM | POA: Insufficient documentation

## 2019-11-01 DIAGNOSIS — I350 Nonrheumatic aortic (valve) stenosis: Secondary | ICD-10-CM | POA: Insufficient documentation

## 2019-11-01 LAB — PT/INR
INR: 2.97 — ABNORMAL HIGH (ref 0.80–1.20)
PROTHROMBIN TIME: 35.3 s — ABNORMAL HIGH (ref 9.1–13.9)

## 2019-11-01 NOTE — Progress Notes (Signed)
Anticoagulation Encounter Note:    Surgical AVR plan  Warfarin start date: 01-06-2019  [facility] Staff: Earlie Raveling  DX : On-X AVR  INR Range: 2.0-3.0  Tab strength: warfarin 2mg   Contact 1: [phone] cell  Contact 2:   Lab: POC/[place]  Comments: Tylenol regularly    INR: 2.97    Current Dose: 4 mg Tu,Fr/ 6 mg all other days    Clinical Outcomes     Positives:  Minor bleeding (pt states that she has had some small nose bleeds lately. ), INR In Range (INR high normal. pt called. )        Patient Findings     Negatives:  Change in health, Change in medications, Change in diet/appetite        Plan: instructed pt to take warfarin 4 mg Su,Tu,Fr/ 6 mg all other days and recheck INR in one month. Dosing verified by pt. Educated pt on keeping nose moist with saline spray during dry winter months.     Next INR Date: 11/29/19    Leone Brand, BSN RN  Specialty Care Nurse, Anticoagulation  [facility]  Ph. [phone]  (ext. [phone])  Fax [phone]  [email or web address]        For additional information, see dosing calendar below.    Warfarin Therapy Instructions   December 2020 Details    Sun Mon Tue Wed Thu Fri Sat       1               2               3               4               5                 6               7               8               9               10               11               12                 13               14               15    2.53   4 mg   See details      16      6 mg         17      6 mg         18      4 mg         19      6 mg           20      6 mg         21      6 mg         22      4 mg         23      6 mg  24      6 mg         25      4 mg         26      6 mg           27      6 mg         28      6 mg         29      4 mg         30      6 mg         31      6 mg            Date Details   12/15 Last INR check   INR: 2.53              Warfarin Therapy Instructions   January 2021 Details    Sun Mon Tue Wed Thu Fri Sat          1      4 mg         2      6 mg           3      6 mg         4      6 mg         5      4 mg         6      6 mg         7      6 mg         8      4 mg         9      6 mg           10      6 mg         11      6 mg         12      4 mg         13   2.97   6 mg   See details      14      6 mg         15      4 mg         16      6 mg           17      4 mg         18      6 mg         19      4 mg         20      6 mg         21      6 mg         22      4 mg         23      6 mg           24      4 mg         25      6 mg         26  4 mg         27      6 mg         28      6 mg         29      4 mg         30      6 mg           31      4 mg                Date Details   01/13 This INR check   INR: 2.97              Warfarin Therapy Instructions   February 2021 Details    Sun Mon Tue Wed Thu Fri Sat      1      6 mg         2      4 mg         3      6 mg         4      6 mg         5      4 mg         6      6 mg           7      4 mg         8      6 mg         9      4 mg         10      6 mg         11               12               13                 14               15               16               17               18               19               20                 21               22               23               24               25               26               27                 28                       Date Details   No additional details  Date of next INR:  11/29/2019

## 2019-11-10 ENCOUNTER — Other Ambulatory Visit: Payer: Self-pay

## 2019-11-24 ENCOUNTER — Ambulatory Visit (HOSPITAL_COMMUNITY): Payer: Self-pay

## 2019-11-24 ENCOUNTER — Encounter (HOSPITAL_COMMUNITY): Payer: Self-pay

## 2019-11-24 LAB — PT/INR: INR: 2.2

## 2019-11-24 NOTE — Progress Notes (Signed)
Anticoagulation Encounter Note:    Surgical AVR plan   Warfarin start date: 01-06-2019   [facility] Staff: Earlie Raveling   DX : On-X AVR   INR Range: 2.0-3.0   Tab strength: warfarin 2mg    Contact 1: [phone] cell   Contact 2:   Lab: [facility]/[place] [facility]   Comments: Tylenol regularly    INR: 2.2  Current Dose: 4 mg Sunday, Tuesday, Friday; 6 mg all other days.   Clinical Outcomes     Positives:  INR In Range          Plan: 4 mg Sunday, Tuesday, Friday; 6 mg all other days. Test INR in 2 weeks. Patient likes to test INR on Tuesdays. Confirmed dosing and testing with patient.   Next INR Date: 12/05/2019    Kate Garrison Michie, BSN       For additional information, see dosing calendar below.    Warfarin Therapy Instructions   January 2021 Details    Sun Mon Tue Wed Thu Fri Sat          1               2                 3               4               5               6               7               8               9                 10               11               12               13   2.97   6 mg   See details      14      6 mg         15      4 mg         16      6 mg           17      4 mg         18      6 mg         19      4 mg         20      6 mg         21      6 mg         22      4 mg         23      6 mg           24      4 mg         25      6 mg         26      4 mg         27   6 mg         28      6 mg         29      4 mg         30      6 mg           31      4 mg                Date Details   01/13 Last INR check   INR: 2.97              Warfarin Therapy Instructions   February 2021 Details    Sun Mon Tue Wed Thu Fri Sat      1      6 mg         2      4 mg         3      6 mg         4   2.2   6 mg   See details      5      4 mg   See details      6      6 mg           7      4 mg         8      6 mg         9      4 mg         10      6 mg         11      6 mg         12      4 mg         13      6 mg           14      4 mg         15      6 mg         16      4 mg         17               18               19                  20                 21               22               23               24               25               26               27                 28                       Date Details   02/04 INR: 2.2      02/05  This INR check       Date of next INR:  12/05/2019

## 2019-11-27 ENCOUNTER — Encounter (HOSPITAL_BASED_OUTPATIENT_CLINIC_OR_DEPARTMENT_OTHER): Payer: Self-pay | Admitting: Family

## 2019-12-05 ENCOUNTER — Encounter (HOSPITAL_COMMUNITY): Payer: Self-pay

## 2019-12-05 ENCOUNTER — Ambulatory Visit (HOSPITAL_COMMUNITY): Payer: Self-pay

## 2019-12-05 LAB — PT/INR: INR: 2.3

## 2019-12-05 NOTE — Progress Notes (Signed)
Anticoagulation Encounter Note:    Surgical AVR plan   Warfarin start date: 01-06-2019   [facility] Staff: Earlie Raveling   DX : On-X AVR   INR Range: 2.0-3.0   Tab strength: warfarin 2mg    Contact 1: [phone] cell   Contact 2:   Lab: P[facility]/[place] [facility]   Comments: Tylenol regularly    INR: 2.3  Current Dose: 4 mg Sunday, Tuesday and Friday; 6 mg all other days.   Clinical Outcomes       Positives:  INR In Range             Plan: 4 mg Sunday, Tuesday and Friday; 6 mg all other days. Confirmed dosing and testing with patient.   Next INR Date: 12/19/2019    Kate Zweifel, BSN       For additional information, see dosing calendar below.    Warfarin Therapy Instructions   February 2021 Details    Sun Mon Tue Wed Thu Fri Sat      1               2               3               4               5      4 mg   See details      6      6 mg           7      4 mg         8      6 mg         9      4 mg         10      6 mg         11      6 mg         12      4 mg         13      6 mg           14      4 mg         15      6 mg         16   2.3   4 mg   See details      17      6 mg         18      6 mg         19      4 mg         20      6 mg           21      4 mg         22      6 mg         23      4 mg         24      6 mg         25      6 mg         26      4 mg         27       6  mg           28      4 mg                Date Details   02/05 Last INR check      02/16 This INR check     INR: 2.3              Warfarin Therapy Instructions   March 2021 Details    Sun Mon Tue Wed Thu Fri Sat      1      6 mg         2      4 mg         3               4               5               6                 7               8               9               10               11               12               13                 14               15               16               17               18               19               20                 21               22               23               24               25               26                27                 28               29               30               31                    Date Details   No additional details    Date of next INR:  12/19/2019

## 2019-12-13 ENCOUNTER — Ambulatory Visit (VACCINATION_CLINIC)

## 2019-12-13 ENCOUNTER — Ambulatory Visit (VACCINATION_CLINIC): Payer: Self-pay

## 2019-12-15 ENCOUNTER — Ambulatory Visit (INDEPENDENT_AMBULATORY_CARE_PROVIDER_SITE_OTHER): Payer: Medicare Other

## 2019-12-15 ENCOUNTER — Encounter (INDEPENDENT_AMBULATORY_CARE_PROVIDER_SITE_OTHER): Payer: Self-pay

## 2019-12-15 ENCOUNTER — Other Ambulatory Visit: Payer: Self-pay

## 2019-12-15 VITALS — BP 162/88 | HR 73 | Temp 97.2°F | Resp 18 | Wt 227.1 lb

## 2019-12-15 DIAGNOSIS — R040 Epistaxis: Secondary | ICD-10-CM

## 2019-12-15 NOTE — Progress Notes (Signed)
History of Present Illness: [name] is a 64 y.o. female who presents to the Urgent Care today with chief complaint of    Chief Complaint            Greene started at 1330 is taking blood thinner        Pt presents to Urgent Care with c/o epistaxis onset 1330 this afternoon. Pt denies any injury or trauma and sts that it began bleeding while she was seated. Pt sts she typically gets nosebleeds in the winter but sts that they usually revolve within seconds. Pt reports that she is currently on Coumadin. Pt sts she has applied nasal saline gel inside of her nose with no relief. Pt describes her nosebleed as left sided, slow, and constant and sts the flow rate has not improved since onset. Pt sts she received the COVID-19 vaccine 2 days ago. Pt denies cough and nasal congestion.      I reviewed and confirmed the patient's past medical history taken by the nurse or medical assistant with the addition of the following:    Past Medical History:    Past Medical History:   Diagnosis Date   . 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 Aortic stenosis    . Arthritis    . Blood in stool     positive cologuard.  Has not yet scheduled colonoscopy   . Cancer (CMS HCC)     skin cancer L lip.  Needs MOHS to be scheduled   . Chronic pain    . Colon polyp    . Dyspnea on exertion     with 2 flights   . Exercise intolerance    . Heart murmur     echo 02/2016, aortic stenosis   . Heartburn    . High blood pressure    . Motion sickness    . Nausea with vomiting    . Obesity    . Rash     scalp psorriais and involves R ear   . Valvular disease     aortic valve stenosis, following with CT surgery, echo 02/2016 and cath 03/2016   . Wears glasses      Past Surgical History:    Past Surgical History:   Procedure Laterality Date   . HX AORTIC VALVE REPLACEMENT     . HX CESAREAN SECTION  1984   . HX HEART CATHETERIZATION      x2 with no intervention per pt report   . HX HERNIA REPAIR  2004, 2006, 2008    x3   . HX HIP  REPLACEMENT Left 08/11/2016   . HX HYSTERECTOMY  2003   . HX OOPHORECTOMY      1 ovary     Allergies:  Allergies   Allergen Reactions   . Iv Contrast Anaphylaxis   . Oregano      Hives     . Stadol [Butorphanol Tartrate] Mental Status Effect   . Tomato Hives/ Urticaria     Medications:    Current Outpatient Medications   Medication Sig   . acetaminophen (TYLENOL) 500 mg Oral Tablet Take 500 mg by mouth Every 4 hours as needed for Pain   . aspirin 81 mg Oral Tablet, Chewable Take 1 Tab (81 mg total) by mouth Once a day   . clobetasoL (CORMAX) 0.05 % Solution APPLY SOLUTION TOPICALLY TO AFFECTED AREA TWICE DAILY   . clobetasoL (TEMOVATE) 0.05 % Cream by  Apply Topically route Twice daily   . fluticasone propionate (FLONASE) 50 mcg/actuation Nasal Spray, Suspension 1 Spray by Each Nostril route Once a day   . loratadine (CLARITIN) 10 mg Oral Tablet Take 10 mg by mouth Once a day   . metoprolol tartrate (LOPRESSOR) 50 mg Oral Tablet TAKE 1 TABLET (50MG ) BY MOUTH TWICE DAILY   . triamcinolone acetonide (ARISTOCORT A) 0.1 % Cream Apply topically Twice daily   . warfarin (COUMADIN) 2 mg Oral Tablet Take 3 Tabs (6 mg total) by mouth Every evening     Social History:    Social History     Tobacco Use   . Smoking status: Former Smoker     Packs/day: 1.00     Years: 35.00     Pack years: 35.00     Types: Cigarettes     Quit date: 2014     Years since quitting: 7.1   . Smokeless tobacco: Never Used   Vaping Use   . Vaping Use: Former   Substance Use Topics   . Alcohol use: Yes     Comment: social only   . Drug use: Not Currently     Family History: No significant family history.  Family Medical History:     Problem Relation (Age of Onset)    Atrial fibrillation Sister    Heart Disease Mother    Hypertension (High Blood Pressure) Father    Lung Cancer Father, Brother    Mitral valve prolapse Sister    Sudden Death no cause Mother        Review of Systems:  ENT: epistaxis, no nasal congestion   Pulmonary: no cough   All other  review of systems are negative    Physical Exam:  Vital signs:   Vitals:    12/15/19 1548   BP: (!) 162/88   Pulse: 73   Resp: 18   Temp: 36.2 C (97.2 F)   TempSrc: Oral   SpO2: 97%   Weight: 103 kg (227 lb 1.2 oz)     Body mass index is 37.79 kg/m. Facility age limit for growth percentiles is 20 years.  No LMP recorded. Patient has had a hysterectomy.    General: Well appearing and no acute distress  Head: Normocephalic and atraumatic  Eyes: Normal lids/lashes, normal conjunctiva  ENT: Slow oozing blood from the left nare in only the anterior aspect, no blood in the turbinates or oropharynx  Skin: Warm/dry and no rash  Psychiatric: Appropriate affect and behavior and normal speech  Neurologic: Alert and oriented x 3    Data Reviewed:  Not applicable    Course: Condition at discharge: Good     Differential Diagnosis: epistaxis vs anticoagulation vs hypertension     Assessment:     1. Epistaxis        Plan:  No orders of the defined types were placed in this encounter.    Afrin administered in clinic along with direct pressure; however, this did not have any significant impact on the oozing; upon further inspection there was a small area of bleeding over the lateral aspect of the left nare; the area was cauterized with silver nitrate and hemostasis was achieved; we observed the Pt for 15 minutes and bleeding did not return; we offered to observe the Pt further but she declined and sts she would rather return home; advised to return if bleeding starts again; Pt sts she will check her INR at home.     Go to  Emergency Department immediately for further work up if any concerning symptoms.  Plan was discussed and patient verbalized understanding. If symptoms are worsening or not improving the patient should return to the Urgent Care for further evaluation.    I am scribing for, and in the presence of, Dr. Ollen Gross, MD for services provided on 12/15/2019.  Celso Sickle, [place] 12/15/2019,  15:54    I personally performed the services described in this documentation, as scribed  in my presence, and it is both accurate  and complete.    Roney Jaffe, MD

## 2019-12-18 ENCOUNTER — Ambulatory Visit (HOSPITAL_COMMUNITY): Payer: Self-pay

## 2019-12-18 ENCOUNTER — Encounter (HOSPITAL_COMMUNITY): Payer: Self-pay

## 2019-12-18 LAB — PT/INR: INR: 2.6

## 2019-12-18 NOTE — Progress Notes (Signed)
Anticoagulation Encounter Note:    Surgical AVR plan   Warfarin start date: 01-06-2019   [facility] Staff: Earlie Raveling   DX : On-X AVR   INR Range: 2.0-3.0   Tab strength: warfarin 2mg    Contact 1: [phone] cell   Contact 2:   Lab: POC/[place] - [facility]   Comments: Tylenol regularly    INR: 2.6  Current Dose: 4 mg Sunday, Tuesday Friday; 6 mg all other days. Patient states she has been having a nose bleed and has had it cauterized and been keeping it moist.   Clinical Outcomes       Positives:  INR In Range          Patient Findings       Positives:  Signs/symptoms of bleeding (nose bleeds)          Plan: 6 mg Wednesday, Thursday, Saturday and Monday; 4 mg all other days. Test INR in 1 week. Confirmed dosing and testing with patient.   Next INR Date: 12/26/2019    Kate Zweifel, BSN       For additional information, see dosing calendar below.    Warfarin Therapy Instructions   February 2021 Details    Sun Mon Tue Wed Thu Fri Sat      1               2               3               4               5               6                 7               8               9               10               11               12               13                 14               15               16   2.3   4 mg   See details      17      6 mg         18      6 mg         19      4 mg         20      6 mg           21      4 mg         22      6 mg         23      4 mg         24      6 mg         25   6 mg         26   2.6   4 mg   See details      27      6 mg           28      4 mg                Date Details   02/16 Last INR check     INR: 2.3      02/26 INR: 2.6              Warfarin Therapy Instructions   March 2021 Details    Sun Mon Tue Wed Thu Fri Sat      1      4 mg   See details      2      4 mg         3      6 mg         4      6 mg         5      4 mg         6      6 mg           7      4 mg         8      6 mg         9      4 mg         10               11               12               13                 14               15                16               17               18               19               20                 21               22               23               24               25               26               27                 28               29               30                31  Date Details   03/01 This INR check       Date of next INR:  12/26/2019

## 2019-12-30 ENCOUNTER — Other Ambulatory Visit (HOSPITAL_COMMUNITY): Payer: Self-pay | Admitting: Cardiovascular Disease

## 2020-01-02 ENCOUNTER — Encounter (HOSPITAL_COMMUNITY): Payer: Self-pay

## 2020-01-02 ENCOUNTER — Ambulatory Visit (HOSPITAL_COMMUNITY): Payer: Self-pay

## 2020-01-02 ENCOUNTER — Encounter (HOSPITAL_BASED_OUTPATIENT_CLINIC_OR_DEPARTMENT_OTHER): Payer: Self-pay | Admitting: Family

## 2020-01-02 LAB — PT/INR: INR: 2.2

## 2020-01-02 NOTE — Progress Notes (Signed)
Anticoagulation Encounter Note:    Surgical AVR plan   Warfarin start date: 01-06-2019   [facility] Staff: Earlie Raveling   DX : On-X AVR   INR Range: 1.5 - 2.0  Tab strength: warfarin 2mg    Contact 1: [phone] cell   Contact 2:   Lab: POC/[place] - [facility]   Comments: Tylenol regularly    INR: 2.2  Current Dose: 4 mg Monday 03/01 once. 4 mg Tuesday, Friday and Sunday; 6 mg all other days.   Clinical Outcomes       Positives:  INR In Range             Plan: Due to INR range decreasing, will decrease dosing. 6 mg Monday, Wednesday and Friday; 4 mg all other days. Test INR in 3 weeks. Confirmed dosing and testing with patient.   Next INR Date: 01/16/2020    Kate Zweifel, BSN       For additional information, see dosing calendar below.    Warfarin Therapy Instructions   March 2021 Details    Sun Mon Tue Wed Thu Fri Sat      1      4 mg   See details      2      4 mg         3      6 mg         4      6 mg         5      4 mg         6      6 mg           7      4 mg         8      6 mg         9      4 mg         10      6 mg         11      6 mg         12      4 mg         13      6 mg           14      4 mg         15      6 mg         16   2.2   4 mg   See details      17      6 mg         18      4 mg         19      6 mg         20      4 mg           21      4 mg         22      6 mg         23      4 mg         24      6 mg         25       4 mg  26      6 mg         27      4 mg           28      4 mg         29      6 mg         30      4 mg         31                   Date Details   03/01 Last INR check      03/16 This INR check     INR: 2.2       Date of next INR:  01/16/2020

## 2020-01-03 ENCOUNTER — Other Ambulatory Visit: Payer: Self-pay

## 2020-01-03 ENCOUNTER — Ambulatory Visit: Payer: Medicare Other | Attending: Dermatology | Admitting: Family

## 2020-01-03 ENCOUNTER — Encounter (HOSPITAL_BASED_OUTPATIENT_CLINIC_OR_DEPARTMENT_OTHER): Payer: Self-pay | Admitting: Family

## 2020-01-03 ENCOUNTER — Ambulatory Visit (VACCINATION_CLINIC): Payer: Medicare Other

## 2020-01-03 VITALS — BP 159/85 | Temp 97.0°F | Ht 65.75 in | Wt 229.5 lb

## 2020-01-03 DIAGNOSIS — Z85828 Personal history of other malignant neoplasm of skin: Secondary | ICD-10-CM

## 2020-01-03 DIAGNOSIS — L408 Other psoriasis: Secondary | ICD-10-CM

## 2020-01-03 DIAGNOSIS — Z79899 Other long term (current) drug therapy: Secondary | ICD-10-CM | POA: Insufficient documentation

## 2020-01-03 DIAGNOSIS — L409 Psoriasis, unspecified: Secondary | ICD-10-CM

## 2020-01-03 MED ORDER — FLUOCINOLONE 0.01 % SCALP OIL AND SHOWER CAP
TOPICAL_OIL | 1 refills | Status: DC
Start: 2020-01-03 — End: 2023-12-22

## 2020-01-03 MED ORDER — FLUOCINOLONE 0.01 % SCALP OIL AND SHOWER CAP
TOPICAL_OIL | 1 refills | Status: DC
Start: 2020-01-03 — End: 2020-01-03

## 2020-01-03 NOTE — Progress Notes (Signed)
Dermatology, [facility]  [place] [address]  [phone]    Date:   01/03/2020  Name: [name]  Age: 64 y.o.    Chief complaint: Skin Check    HPI  [name] is a 64 y.o. female with a history of BCC on her left upper lip s/p MOHs on 03/08/2019 presenting for a skin check. 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 States she has been using Clobetasol solution with minimal improvement. Here to discuss treatment options. States she had itching over the last 6 months in her gluteal cleft. Has been using Triamcinolone cream with some improvement. Otherwise denies any new, changing, bleeding, or rapidly growing lesions.  No other skin-related complaints. Her sister has a history of a NMSC.         Review of Systems   Constitutional: Negative for chills and fever.   Skin: Negative for itching and rash.     Current Medications   acetaminophen (TYLENOL) 500 mg Oral Tablet Take 500 mg by mouth Every 4 hours as needed for Pain    aspirin 81 mg Oral Tablet, Chewable Take 1 Tab (81 mg total) by mouth Once a day    clobetasoL (CORMAX) 0.05 % Solution APPLY SOLUTION TOPICALLY TO AFFECTED AREA TWICE DAILY    clobetasoL (TEMOVATE) 0.05 % Cream by Apply Topically route Twice daily    fluticasone propionate (FLONASE) 50 mcg/actuation Nasal Spray, Suspension 1 Spray by Each Nostril route Once a day    loratadine (CLARITIN) 10 mg Oral Tablet Take 10 mg by mouth Once a day    metoprolol tartrate (LOPRESSOR) 50 mg Oral Tablet TAKE 1 TABLET (50MG ) BY MOUTH TWICE DAILY    triamcinolone acetonide (ARISTOCORT A) 0.1 % Cream Apply topically Twice daily    warfarin (COUMADIN) 2 mg Oral Tablet TAKE 3 TABLETS (6 MG TOTAL) BY MOUTH ONCE DAILY IN THE EVENING     Allergies   Allergen Reactions    Iv Contrast Anaphylaxis    Oregano      Hives      Stadol [Butorphanol Tartrate] Mental Status Effect    Tomato Hives/ Urticaria     Past Medical History:   Diagnosis Date     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  Has not yet scheduled colonoscopy    Cancer (CMS HCC)     skin cancer L lip.  Needs MOHS to be scheduled    Chronic pain     Colon polyp     Dyspnea on exertion     with 2 flights    Exercise intolerance     Heart murmur     echo 02/2016, aortic stenosis    Heartburn     High blood pressure     Motion sickness     Nausea with vomiting     Obesity     Rash     scalp psorriais and involves R ear    Valvular disease     aortic valve stenosis, following with CT surgery, echo 02/2016 and cath 03/2016    Wears glasses          Physical Exam  Vitals: Blood pressure (!) 159/85, temperature 36.1 C (97 F), temperature source Temporal, height 1.67 m (5' 5.75"), weight 104 kg (229 lb 8 oz),  not currently breastfeeding.  Physical Exam  Constitutional:       General: She is not in acute distress.     Appearance: Normal appearance.   HENT:      Head:     Skin:     General: Skin is warm and dry.      Coloration: Skin is not pale.          Neurological:      Mental Status: She is alert and oriented to person, place, and time. Mental status is at baseline.      Coordination: Coordination normal.   Psychiatric:         Mood and Affect: Mood normal.     General skin exam was performed including head, neck, anterior/posterior trunk, bilateral upper & lower extremities and revealed no areas of concern other than those documented.    Assessment and Plan  Problem List Items Addressed This Visit       None        1. 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  -Continue Triamcinolone 0.1% cream to the body twice daily as needed. We discussed the potential adverse effects of prolonged topical steroid use,  including skin atrophy, dyspigmentation, telangiectasias, striae and acne.    3. Scalp Psoriasis (See #2 on head diagram)  -We discussed the diagnosis, etiology, natural course and management of this condition.  -Fluocinolone 0.01% oil 1-2 times weekly. Prescription given with good rx coupon. Reviewed risk of skin atrophy with prolonged use.      The patient was educated on the importance of avoiding excessive sun exposure and wearing sunscreen daily SPF 30 or higher.  Advised patient to re-apply sunscreen every 2-3 hours.  Advised the patient to avoid going to and using tanning beds.  Advised to check skin routinely for any changes, especially any new moles or changes in existing moles      RTC in 6 months or sooner if needed    This is a shared visit with Dr. [place] Aastha Dayley, APRN,FNP-BC  01/03/2020, 16:32        I personally saw and evaluated the patient. See mid-level's note for additional details. My findings/participation are hx of BCC; psoriasis - start dermasmoothe     Dairl Ponder, MD

## 2020-01-08 ENCOUNTER — Other Ambulatory Visit: Payer: Self-pay

## 2020-01-08 ENCOUNTER — Ambulatory Visit
Admission: RE | Admit: 2020-01-08 | Discharge: 2020-01-08 | Disposition: A | Discharge status: Home / Self Care | Payer: Medicare Other | Source: Ambulatory Visit | Attending: Cardiovascular Disease | Admitting: Cardiovascular Disease

## 2020-01-08 DIAGNOSIS — Z952 Presence of prosthetic heart valve: Secondary | ICD-10-CM | POA: Insufficient documentation

## 2020-01-08 DIAGNOSIS — I351 Nonrheumatic aortic (valve) insufficiency: Secondary | ICD-10-CM

## 2020-01-09 ENCOUNTER — Encounter (HOSPITAL_BASED_OUTPATIENT_CLINIC_OR_DEPARTMENT_OTHER): Payer: Self-pay | Admitting: Family

## 2020-01-09 ENCOUNTER — Ambulatory Visit: Payer: Medicare Other | Attending: Family | Admitting: Family

## 2020-01-09 VITALS — BP 122/80 | HR 76 | Ht 65.0 in | Wt 228.4 lb

## 2020-01-09 DIAGNOSIS — Z0000 Encounter for general adult medical examination without abnormal findings: Secondary | ICD-10-CM | POA: Insufficient documentation

## 2020-01-09 DIAGNOSIS — Z780 Asymptomatic menopausal state: Secondary | ICD-10-CM

## 2020-01-09 DIAGNOSIS — J0100 Acute maxillary sinusitis, unspecified: Secondary | ICD-10-CM | POA: Insufficient documentation

## 2020-01-09 DIAGNOSIS — Z131 Encounter for screening for diabetes mellitus: Secondary | ICD-10-CM | POA: Insufficient documentation

## 2020-01-09 DIAGNOSIS — Z1231 Encounter for screening mammogram for malignant neoplasm of breast: Secondary | ICD-10-CM | POA: Insufficient documentation

## 2020-01-09 DIAGNOSIS — Z1329 Encounter for screening for other suspected endocrine disorder: Secondary | ICD-10-CM | POA: Insufficient documentation

## 2020-01-09 DIAGNOSIS — I10 Essential (primary) hypertension: Secondary | ICD-10-CM | POA: Insufficient documentation

## 2020-01-09 DIAGNOSIS — Z1321 Encounter for screening for nutritional disorder: Secondary | ICD-10-CM | POA: Insufficient documentation

## 2020-01-09 DIAGNOSIS — Z13820 Encounter for screening for osteoporosis: Secondary | ICD-10-CM | POA: Insufficient documentation

## 2020-01-09 DIAGNOSIS — R195 Other fecal abnormalities: Secondary | ICD-10-CM | POA: Insufficient documentation

## 2020-01-09 DIAGNOSIS — Z13220 Encounter for screening for lipoid disorders: Secondary | ICD-10-CM | POA: Insufficient documentation

## 2020-01-09 DIAGNOSIS — Z87891 Personal history of nicotine dependence: Secondary | ICD-10-CM | POA: Insufficient documentation

## 2020-01-09 MED ORDER — HYDROCHLOROTHIAZIDE 25 MG TABLET
25.0000 mg | ORAL_TABLET | Freq: Every day | ORAL | 4 refills | Status: DC
Start: 2020-01-09 — End: 2020-05-28

## 2020-01-09 MED ORDER — FLUTICASONE PROPIONATE 50 MCG/ACTUATION NASAL SPRAY,SUSPENSION
1.0000 | Freq: Every day | NASAL | 3 refills | Status: DC
Start: 2020-01-09 — End: 2021-02-19

## 2020-01-09 NOTE — Progress Notes (Signed)
FAMILY MEDICINE, [facility]  [address]  [place] [address]  Operated by [facility]  Medicare Annual Wellness Visit    Name: [name] MRN:  [id number]   Date: 01/09/2020 Age: 64 y.o.       SUBJECTIVE:   [name] is a 64 y.o. female for presenting for Medicare Wellness exam.   I have reviewed and reconciled the medication list with the patient today.    Comprehensive Health Assessment:  Comprehensive Health Assessment-Adult 01/09/2020   Do you wish to complete this form? Yes   During the past 4 weeks, how would you rate your health in general? Good   During the past 4 weeks, how much difficulty have you had doing your usual activities inside and outside your home because of medical or emotional problems? Some difficulty   During the past 4 weeks, was someone available to help you if you needed and wanted help? Yes, some   In the past year, how many times have you gone to the emergency department or been admitted to a hospital for a health problem? 1 time   Have you fallen 2 or more times in the last year? No   During the past 4 weeks, how much bodily pain have you typically had? Severe pain   Do you have enough money to buy things you need in everyday life, such as food, clothing, medicines, and housing? Yes, always   Can you get to places beyond walking distance without help?  (For example, can you drive your own car or travel alone on buses)? Yes   Do you fasten your seatbelt when you are in a car? Yes, usually   Are you generally satisfied with your sleep? No   Over the last 2 weeks, how often have you been feeling nervous, anxious or on edge? More than half the days   Over the last 2 weeks, how often have you been bothered by not being able to stop or control worrying? Several days   Over the last 2 weeks, how often have you been bothered by little interest of pleasure in doing things? Several days   Over the last 2 weeks, how often have you been bothered by feeling down,  depressed, or hopeless? Several days   During the past 12 months, have you experienced confusion or memory loss that is happening more often or is getting worse? No   Do you smoke, or use tobacco? No   If you do not smoke, how often are you in the room with someone who is smoking? Never   Do you exercise 20 minutes 3 or more days per week (such as walking, dancing, biking, mowing grass, swimming)? No, I usually don't exercise this much   How often do you eat food that is healthy (fruits, vegetables, lean meats) instead of unhealthy (sweets, fast food, junk food, fatty foods)? Most of the time   During the past 4 weeks, how many drinks of wine, beer, or other alcohol-containing beverages did you have? No alcohol at all   How often do you have trouble taking medicines the eay you are told to take them? I always take them as prescribed   Do you currently use opioid pain medications, take regularly or keep on hand for use as needed (medication such as hydrocodone, oxycodone, codeine or morphine)? No   How many times in the past year have you used an illegal drug or used prescription medication for nonmedical reasons? None  Have your parents, brothers or sisters had any of the following problems before the age of 66 (check all that apply)? Hearth problems or hardening for arteries;Cancer;Alcohol or durg addiction (or abuse)   How confident are you that you can control or manage most of your health problems? Somewhat confident   Do you need any help communicating with your doctors and nurses because of vision or hearing problems? No   Do you have one person you think of as your personal doctor (primary care provider or family doctor)? Yes   If you answered yes to the question above, please list the name of your provider EO   Are you now also seeing any specialist physician(s) (such as eye doctor, foot doctor, skin doctor)? Yes   If you are seeing a specialist for anything such as foot, eye, skin, etc.  please list their  name(s) Lowry Ram and Bandy         I have reviewed and updated as appropriate the past medical, family and social history. 01/09/2020 as summarized below:  Past Medical History:   Diagnosis Date   . 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 Aortic stenosis    . Arthritis    . Blood in stool     positive cologuard.  Has not yet scheduled colonoscopy   . Cancer (CMS HCC)     skin cancer L lip.  Needs MOHS to be scheduled   . Chronic pain    . Colon polyp    . Dyspnea on exertion     with 2 flights   . Exercise intolerance    . Heart murmur     echo 02/2016, aortic stenosis   . Heartburn    . High blood pressure    . Motion sickness    . Nausea with vomiting    . Obesity    . Rash     scalp psorriais and involves R ear   . Valvular disease     aortic valve stenosis, following with CT surgery, echo 02/2016 and cath 03/2016   . Wears glasses      Past Surgical History:   Procedure Laterality Date   . Hx aortic valve replacement     . Hx cesarean section  1984   . Hx heart catheterization     . Hx hernia repair  2004, 2006, 2008   . Hx hip replacement Left 08/11/2016   . Hx hysterectomy  2003   . Hx oophorectomy       Current Outpatient Medications   Medication Sig   . acetaminophen (TYLENOL) 500 mg Oral Tablet Take 650 mg by mouth Every night    . aspirin 81 mg Oral Tablet, Chewable Take 1 Tab (81 mg total) by mouth Once a day   . clobetasoL (CORMAX) 0.05 % Solution APPLY SOLUTION TOPICALLY TO AFFECTED AREA TWICE DAILY   . clobetasoL (TEMOVATE) 0.05 % Cream by Apply Topically route Twice daily   . ergocalciferol, vitamin D2, (VITAMIN D2 ORAL) Take by mouth   . Fluocinolone-Shower Cap 0.01 % Oil Apply to scalp 1-2 times weekly at night.   . fluticasone propionate (FLONASE) 50 mcg/actuation Nasal Spray, Suspension 1 Spray by Each Nostril route Once a day   . hydroCHLOROthiazide (HYDRODIURIL) 25 mg Oral Tablet Take 1 Tablet (25 mg total) by mouth Once a day   . loratadine (CLARITIN) 10 mg Oral Tablet Take 10  mg by mouth Once a day   .  metoprolol tartrate (LOPRESSOR) 50 mg Oral Tablet TAKE 1 TABLET (50MG ) BY MOUTH TWICE DAILY   . triamcinolone acetonide (ARISTOCORT A) 0.1 % Cream Apply topically Twice daily   . warfarin (COUMADIN) 2 mg Oral Tablet TAKE 3 TABLETS (6 MG TOTAL) BY MOUTH ONCE DAILY IN THE EVENING     Family Medical History:     Problem Relation (Age of Onset)    Atrial fibrillation Sister    Heart Disease Mother    Hypertension (High Blood Pressure) Father    Lung Cancer Father, Brother    Mitral valve prolapse Sister    Sudden Death no cause Mother            Social History     Socioeconomic History   . Marital status: Single     Spouse name: Not on file   . Number of children: Not on file   . Years of education: Not on file   . Highest education level: Not on file   Occupational History   . Occupation: photography    Tobacco Use   . Smoking status: Former Smoker     Packs/day: 1.00     Years: 35.00     Pack years: 35.00     Types: Cigarettes     Quit date: 2014     Years since quitting: 7.2   . Smokeless tobacco: Never Used   Vaping Use   . Vaping Use: Never used   Substance and Sexual Activity   . Alcohol use: Yes     Comment: social only   . Drug use: Not Currently   . Sexual activity: Not Currently   Other Topics Concern   . Ability to Walk 1 Flight of Steps without SOB/CP Yes   . Ability to Walk 2 Flight of Steps without SOB/CP No   . Ability To Do Own ADL's Yes     Social Determinants of Health     Financial Resource Strain:    . Difficulty of Paying Living Expenses:    Food Insecurity:    . Worried About Charity fundraiser in the Last Year:    . Arboriculturist in the Last Year:    Transportation Needs:    . Film/video editor (Medical):    Marland Kitchen Lack of Transportation (Non-Medical):    Physical Activity:    . Days of Exercise per Week:    . Minutes of Exercise per Session:    Stress:    . Feeling of Stress :    Intimate Partner Violence:    . Fear of Current or Ex-Partner:    . Emotionally Abused:     Marland Kitchen Physically Abused:    . Sexually Abused:      Review of Systems:   Review of Systems   Constitutional: Negative for chills and fever.   HENT: Negative.    Eyes: Negative for visual disturbance.   Respiratory: Negative for chest tightness, shortness of breath and wheezing.    Gastrointestinal: Negative.    Musculoskeletal: Positive for arthralgias and gait problem.   Neurological: Negative for headaches.   Psychiatric/Behavioral: Negative.         List of Current Health Care Providers   Care Team     PCP     Name Type Specialty Phone Number    Derek Jack, [facility] Nurse Practitioner [facility] [phone]          Care Team     Name Type Specialty Phone  Number    Joaquin Bend, MD Physician CARDIOLOGY [phone]    [place] Team, [place] Not available Not available Not available    Jamas Lav [facility] Nurse Practitioner NURSE PRACTITIONER [phone]                  Health Maintenance   Topic Date Due   . Osteoporosis screening  Never done   . Shingles Vaccine (1 of 2) Never done   . Pneumococcal 65+ Years Low Risk (1 of 1 - PPSV23) Never done   . CT Lung Cancer Screening  12/02/2019   . Depression Screening  01/08/2021   . Annual Wellness Visit  01/08/2021   . Mammography  07/10/2021   . Fecal DNA Test ([facility])  11/21/2021   . Adult Tdap-Td (2 - Td) 11/08/2028   . Hepatitis C screening  Completed   . HIV Screening  Discontinued   . Influenza Vaccine  Discontinued     Medicare Wellness Assessment   Medicare initial or wellness physical in the last year?: No  Advance Directives (optional)   Does patient have a living will or MPOA: YES   Has patient provided [employer] with a copy?: YES   Advance directive information given to the patient today?: no      Activities of Daily Living   Do you need help with dressing, bathing, or walking?: No   Do you need help with shopping, housekeeping, medications, or finances?: No   Do you have rugs in hallways, broken steps, or poor  lighting?: No   Do you have grab bars in your bathroom, non-slip strips in your tub, and hand rails on your stairs?: Yes   Urinary Incontinence Screen (Women >=65 only)   Do you ever leak urine when you don't want to?: YES   Cognitive Function Screen (1=Yes, 0=No)   What is you age?: Correct   What is the time to the nearest hour?: Correct   What is the year?: Correct   What is the name of this clinic?: Correct   Can the patient recognize two persons (the doctor, the nurse, home help, etc.)?: Correct   What is the date of your birth? (day and month sufficient) : Correct   In what year did World War II end?: Correct   Who is the current president of the [place]?: Correct   Count from 20 down to 1?: Correct   What address did I give you earlier?: Correct   Total Score: 10       Hearing Screen   Have you noticed any hearing difficulties?: No  After whispering 9-1-6 how many numbers did the patient repeat correctly?: 3   Fall Risk Screen   Do you feel unsteady when standing or walking?: No  Do you worry about falling?: No  Have you fallen in the past year?: No   Vision Screen   Right Eye = 20:  (Patient will schedule appointment.)       Depression Screen   Little interest or pleasure in doing things.: Several Days  Feeling down, depressed, or hopeless: Several Days  PHQ 2 Total: 2        OBJECTIVE:   BP 122/80   Pulse 76   Ht 1.651 m (5\' 5" )   Wt 104 kg (228 lb 6.3 oz)   SpO2 96%   BMI 38.01 kg/m     Other appropriate exam:  General General: appears in good health  HENT:ENT without erythema or injection, mucous  membranes moist.  Lungs: clear to auscultation bilaterally.   Cardiovascular:    Heart regular rate and rhythm  Abdomen: soft, non-tender, bowel sounds normal and non-distended  Psychiatric: normal affect, behavior, memory, thought content, judgement, and speech.      Health Maintenance Due   Topic Date Due   . Osteoporosis screening  Never done   . Shingles Vaccine (1 of 2) Never done   .  Pneumococcal 65+ Years Low Risk (1 of 1 - PPSV23) Never done   . CT Lung Cancer Screening  12/02/2019      ASSESSMENT & PLAN:   1. Welcome to Medicare preventive visit    2. Acute non-recurrent maxillary sinusitis    3. Screening cholesterol level    4. Screening for diabetes mellitus (DM)    5. Screening for thyroid disorder    6. Encounter for vitamin deficiency screening    7. Screening for osteoporosis    8. Postmenopausal    9. Encounter for screening mammogram for malignant neoplasm of breast    10. Personal history of nicotine dependence    11. Essential hypertension    12. Positive colorectal cancer screening using Cologuard test       Identified Risk Factors/ Recommended Actions     The PHQ 2 Total: 2 depression screen is interpreted as negative.  Urinary Incontinence Plan of Care: Behavioral interventions with bladder training, pelvic floor muscle training, and/or prompted voiding  Orders Placed This Encounter   . Endoscopy Request ([facility] ONLY)   . DEXA BONE DENSITOMETRY   . MAMMO BILATERAL SCREENING-ADDL VIEWS/BREAST US AS REQ BY RAD   . CT LUNG CANCER SCREENING PROGRAM BASELINE   . LIPID PANEL   . BASIC METABOLIC PANEL, FASTING   . THYROID STIMULATING HORMONE WITH FREE T4 REFLEX   . VITAMIN D 25, TOTAL   . fluticasone propionate (FLONASE) 50 mcg/actuation Nasal Spray, Suspension   . hydroCHLOROthiazide (HYDRODIURIL) 25 mg Oral Tablet        The patient has been educated about risk factors and recommended preventive care. Written Prevention Plan completed/ updated and given to patient (see After Visit Summary).    Return in about 8 weeks (around 03/05/2020) for HTN follow-up.    Derek Jack, APRN,FNP-BC  FAMILY MEDICINE, [place]  [place]  [place] [address]  Phone: [phone]

## 2020-01-15 NOTE — Progress Notes (Signed)
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 She was last seen 08/08/2019 by Dr. Earlie Raveling and at that time was doing well.  She had a f/u TTE 01/08/2020 which showed no AS, mild AI, mean gradient of 9 mmHg and max gradient of 16 mmHg, which is a normal gradient for the valve type and size. EF was 63.1%.  She denies any significant anginal symptoms, or worsening dyspnea, she denies any gasping at night time for air or noted apneas. She denies any palpitations or feelings of atrial fibrillation.  She does report Metoprolol has been making her sleepy.     Has been gaining weigh about 30 lbs over the last year.     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  Former 22 Pack year smoker.  Hip replacement in 05/2016    Medications  ASA 81  HCTZ 25  Lopressor 50 BID  Coumadin INR goal 1.5-2, has a home machine.    Social History  Former smoker 35 pack year quitting in 2014.  Occasional ETOH.    Family History  Denies a family history of premature CAD    ROS  Other than HPI a 10 point ROS was negative.    Objective  Temperature: 36.5 C (97.7 F)  Heart Rate: 81  BP (Non-Invasive): 121/80  Respiratory Rate: 16  SpO2: 95 %  Constitutional: This is a well developed well nourished female  Eyes: EOMI, no scleral jaundice or conjunctival erythema noted.   HENT: Normocephalic and atraumatic.  Neck:  Supple, symmetric, trachea midline, no masses. The thyroid appears normal. There is no JVD.  Respiratory: Lungs are clear to auscultation. No wheezing, rales or rhonchi noted. There is normal respiratory effort.  Cardiovascular: S1, S2 are normal. The rhythm is regular. There is no click, murmur, rub or gallop noted.      Gastrointestinal: Normal bowel sounds. BMI 37, moderately obese.  Musculoskeletal: No obvious deformity or swelling.   Skin: normal coloration and turgor, no rashes noted.  Neurologic: CN's Grossly intact, alert and oriented X 3.  Genitourinary: Deferred  Lymphatic: No lymphadenopathy appreciated.  Psychiatric: appears in good mood, fluent in speech and cognition.     Assessment Plan    This is a 64 YO F seen for follow up of her mechanical aortic valve, an On-X 21, placed 01/04/2019. Her f/u TTE shows a well functioning prosthesis. We will continue to monitor her AV yearly. She has a dilated left atrium likely related to her hypertension and history of severe AS. Her BP is controlled today. We will change her lopressor from 50 bid to Toprol 50 daily due to her fatigue when taking her morning dose and see if this abates her symptoms.      F/U 6 months    Alda Berthold, MD  Fellow, Cardiovascular Disease        01/16/2020  I saw and examined the patient.  I reviewed the fellow's note.  I agree with the findings and plan of care as documented in the fellow's note.  Any exceptions/additions are edited/noted.    Joaquin Bend, MD

## 2020-01-16 ENCOUNTER — Ambulatory Visit: Payer: Medicare Other | Attending: Hospitalist | Admitting: Hospitalist

## 2020-01-16 ENCOUNTER — Encounter (HOSPITAL_COMMUNITY): Payer: Self-pay

## 2020-01-16 ENCOUNTER — Other Ambulatory Visit: Payer: Self-pay

## 2020-01-16 ENCOUNTER — Ambulatory Visit (HOSPITAL_COMMUNITY): Payer: Self-pay

## 2020-01-16 ENCOUNTER — Encounter (HOSPITAL_COMMUNITY): Payer: Self-pay | Admitting: Hospitalist

## 2020-01-16 VITALS — BP 121/80 | HR 81 | Temp 97.7°F | Resp 16 | Ht 65.0 in | Wt 222.7 lb

## 2020-01-16 DIAGNOSIS — I10 Essential (primary) hypertension: Secondary | ICD-10-CM

## 2020-01-16 DIAGNOSIS — Z79899 Other long term (current) drug therapy: Secondary | ICD-10-CM | POA: Insufficient documentation

## 2020-01-16 DIAGNOSIS — Z952 Presence of prosthetic heart valve: Secondary | ICD-10-CM | POA: Insufficient documentation

## 2020-01-16 LAB — PT/INR: INR: 1.9

## 2020-01-16 MED ORDER — METOPROLOL SUCCINATE ER 50 MG TABLET,EXTENDED RELEASE 24 HR
50.0000 mg | ORAL_TABLET | Freq: Every day | ORAL | 4 refills | Status: DC
Start: 2020-01-16 — End: 2020-05-23

## 2020-01-16 NOTE — Progress Notes (Signed)
Anticoagulation Encounter Note:    Surgical AVR plan   Warfarin start date: 01-06-2019   [facility]VI Staff: Earlie Raveling   DX : On-X AVR   INR Range: 1.5 - 2.0  Tab strength: warfarin 2mg    Contact 1: [phone] cell   Contact 2:   Lab: POC/[place] - [facility]   Comments: Tylenol regularly    INR: 1.9    Current Dose: 6 mg M,W,F/ 4 mg all other days    Clinical Outcomes       Positives:  INR In Range (INR good. pt called. )             Plan: pt stated that she fell hitting her arm causing a couple skin tears that were bleeding. Pt was able to get bleeding stopped but stated that was last Friday evening and she was due for warfarin 6 mg and only took 4 mg as she was worried about further bleeding.   Pt instructed to continue current dosing and recheck INR in two weeks. Dosing verified by pt.     Next INR Date: 01/30/20    Leone Brand, BSN RN  Specialty Care Nurse, Anticoagulation  [facility]  Ph. [phone]  (ext. [phone])  Fax [phone]  [email or web address]        For additional information, see dosing calendar below.    Warfarin Therapy Instructions   March 2021 Details    Sun [name] Thu Fri Sat      1               2               3               4               5               6                 7               8               9               10               11               12               13                 14               15               16    2.2   4 mg   See details      17      6 mg         18      4 mg         19      6 mg         20      4 mg           21      4 mg         22      6 mg  23      4 mg         24      6 mg         25      4 mg         26      4 mg         27      4 mg           28      4 mg         29      6 mg         30   1.9   4 mg   See details      31      6 mg             Date Details   03/16 Last INR check     INR: 2.2      03/30 This INR check     INR: 1.9              Warfarin Therapy Instructions   April 2021 Details    Sun Mon Tue Wed Thu Fri Sat          1      4 mg         2      6 mg         3      4 mg           4      4 mg         5      6 mg         6      4 mg         7      6 mg         8      4 mg         9      6 mg         10      4 mg           11      4 mg         12      6 mg         13      4 mg         14               15               16               17                 18               19               20               21               22               23                24  25               26               27               28               29               30                  Date Details   No additional details    Date of next INR:  01/30/2020

## 2020-01-19 ENCOUNTER — Encounter (INDEPENDENT_AMBULATORY_CARE_PROVIDER_SITE_OTHER): Payer: Self-pay

## 2020-01-22 ENCOUNTER — Ambulatory Visit: Payer: Medicare Other | Attending: Family | Admitting: Gynecology

## 2020-01-22 DIAGNOSIS — Z1321 Encounter for screening for nutritional disorder: Secondary | ICD-10-CM | POA: Insufficient documentation

## 2020-01-22 DIAGNOSIS — Z131 Encounter for screening for diabetes mellitus: Secondary | ICD-10-CM | POA: Insufficient documentation

## 2020-01-22 DIAGNOSIS — Z1329 Encounter for screening for other suspected endocrine disorder: Secondary | ICD-10-CM | POA: Insufficient documentation

## 2020-01-22 DIAGNOSIS — Z13220 Encounter for screening for lipoid disorders: Secondary | ICD-10-CM | POA: Insufficient documentation

## 2020-01-22 DIAGNOSIS — Z79899 Other long term (current) drug therapy: Secondary | ICD-10-CM | POA: Insufficient documentation

## 2020-01-22 LAB — LIPID PANEL
CHOL/HDL RATIO: 3
CHOLESTEROL: 194 mg/dL (ref 100–200)
HDL CHOL: 64 mg/dL (ref >=50)
LDL CALC: 113 mg/dL — ABNORMAL HIGH (ref <100)
NON-HDL: 130 mg/dL (ref <=190)
TRIGLYCERIDES: 87 mg/dL (ref <150)
VLDL CALC: 17 mg/dL (ref <30)

## 2020-01-22 LAB — THYROID STIMULATING HORMONE WITH FREE T4 REFLEX: TSH: 1.69 u[IU]/mL (ref 0.430–3.550)

## 2020-01-22 LAB — BASIC METABOLIC PANEL, FASTING
ANION GAP: 11 mmol/L (ref 4–13)
BUN/CREA RATIO: 19 (ref 6–22)
BUN: 15 mg/dL (ref 8–25)
CALCIUM: 8.8 mg/dL (ref 8.8–10.2)
CHLORIDE: 101 mmol/L (ref 96–111)
CO2 TOTAL: 26 mmol/L (ref 23–31)
CREATININE: 0.79 mg/dL (ref 0.60–1.05)
ESTIMATED GFR: 79 mL/min/BSA (ref >=60)
GLUCOSE: 88 mg/dL (ref 65–125)
POTASSIUM: 3.9 mmol/L (ref 3.5–5.1)
SODIUM: 138 mmol/L (ref 136–145)

## 2020-01-22 LAB — VITAMIN D 25, TOTAL: VITAMIN D, 25OH: 33 ng/mL (ref 30–100)

## 2020-01-30 ENCOUNTER — Ambulatory Visit (HOSPITAL_BASED_OUTPATIENT_CLINIC_OR_DEPARTMENT_OTHER): Payer: Self-pay

## 2020-01-30 ENCOUNTER — Encounter (HOSPITAL_COMMUNITY): Payer: Self-pay

## 2020-01-30 ENCOUNTER — Ambulatory Visit (HOSPITAL_COMMUNITY): Payer: Self-pay

## 2020-01-30 DIAGNOSIS — Z952 Presence of prosthetic heart valve: Secondary | ICD-10-CM

## 2020-01-30 LAB — PT/INR: INR: 2.2

## 2020-01-30 NOTE — Progress Notes (Signed)
Anticoagulation Encounter Note:    Surgical AVR plan   Warfarin start date: 01-06-2019   [facility] Staff: Earlie Raveling   DX : On-X AVR   INR Range: 1.5 - 2.0  Tab strength: warfarin 2mg    Contact 1: [phone] cell   Contact 2:   Lab: POC/[place]  - [facility]   Comments: Tylenol regularly    INR: 2.2  Current Dose: 6mg  Mon/Wed/Fri and 4mg  all other days        Plan: Patient to take 6mg  Mon/Wed and 4mg  all other days. Recheck in one week. Confirmed plan with patient.  Next INR Date: 02/06/20      For additional information, see dosing calendar below.    Warfarin Therapy Instructions   March 2021 Details    Sun Molli Knock Tue Wed Thu Fri Sat      1               2               3               4               5               6                 7               8               9               10               11               12               13                 14               15               16               17               18               19               20                 21               22               23               24               25               26               27                 28               29               30    1.9   4 mg   See details  31      6 mg             Date Details   03/30 Last INR check     INR: 1.9              Warfarin Therapy Instructions   April 2021 Details    Sun [name] Thu Fri Sat         1      4 mg         2      6 mg         3      4 mg           4      4 mg         5      6 mg         6      4 mg         7      6 mg         8      4 mg         9      6 mg         10      4 mg           11      4 mg         12      6 mg         13   2.2   4 mg   See details      14      6 mg         15      4 mg         16      4 mg         17      4 mg           18      4 mg         19      6 mg         20      4 mg         21               22               23               24                 25               26               27               28               29               30                  Date  Details   04/13 This INR check     INR: 2.2       Date of next INR:  02/06/2020

## 2020-02-06 ENCOUNTER — Encounter (HOSPITAL_COMMUNITY): Payer: Self-pay

## 2020-02-06 ENCOUNTER — Other Ambulatory Visit (HOSPITAL_BASED_OUTPATIENT_CLINIC_OR_DEPARTMENT_OTHER): Payer: Self-pay

## 2020-02-06 ENCOUNTER — Ambulatory Visit (HOSPITAL_COMMUNITY): Payer: Self-pay

## 2020-02-06 LAB — PT/INR: INR: 2

## 2020-02-06 NOTE — Progress Notes (Signed)
Anticoagulation Encounter Note:    Surgical AVR plan   Warfarin start date: 01-06-2019   [facility] Staff: Earlie Raveling   DX : On-X AVR   INR Range: 1.5 - 2.0  Tab strength: warfarin 2mg    Contact 1: [phone] cell   Contact 2:   Lab: POC/[place] - [facility]   Comments: Tylenol regularly    INR: 2.0  Current Dose: 6 mg Monday and Wednesday; 4 mg all other days.   Clinical Outcomes       Positives:  INR In Range             Plan: 6 mg Monday; 4 mg all other days. Test INR in 2 weeks. Confirmed dosing and testing with patient.  Next INR Date: 02/20/2020    Filbert [facility]old, BSN       For additional information, see dosing calendar below.    Warfarin Therapy Instructions   April 2021 Details    Sun [name] Thu Fri Sat         1               2               3                 4               5               6               7               8               9               10                 11               12               13    2.2   4 mg   See details      14      6 mg         15      4 mg         16      4 mg         17      4 mg           18      4 mg         19      6 mg         20   2.0   4 mg   See details      21      4 mg         22      4 mg         23      4 mg         24      4 mg           25      4 mg         26      6 mg         27      4 mg  28      4 mg         29      4 mg         30      4 mg           Date Details   04/13 Last INR check     INR: 2.2      04/20 This INR check     INR: 2.0              Warfarin Therapy Instructions   May 2021 Details    Sun Mon Tue Wed Thu Fri Sat           1      4 mg           2      4 mg         3      6 mg         4      4 mg         5               6               7               8                 9               10               11               12               13               14               15                 16               17               18               19               20               21               22                 23               24               25                 26               27               28               29                 30                31  Date Details   No additional details    Date of next INR:  02/20/2020

## 2020-02-08 ENCOUNTER — Ambulatory Visit (HOSPITAL_BASED_OUTPATIENT_CLINIC_OR_DEPARTMENT_OTHER): Payer: Self-pay

## 2020-02-12 ENCOUNTER — Encounter (HOSPITAL_BASED_OUTPATIENT_CLINIC_OR_DEPARTMENT_OTHER): Payer: Self-pay

## 2020-02-14 ENCOUNTER — Ambulatory Visit
Admission: RE | Admit: 2020-02-14 | Discharge: 2020-02-14 | Disposition: A | Discharge status: Home / Self Care | Payer: Medicare Other | Source: Ambulatory Visit | Attending: Family | Admitting: Family

## 2020-02-14 ENCOUNTER — Other Ambulatory Visit: Payer: Self-pay

## 2020-02-14 DIAGNOSIS — Z954 Presence of other heart-valve replacement: Secondary | ICD-10-CM

## 2020-02-14 DIAGNOSIS — I2510 Atherosclerotic heart disease of native coronary artery without angina pectoris: Secondary | ICD-10-CM

## 2020-02-14 DIAGNOSIS — Z87891 Personal history of nicotine dependence: Secondary | ICD-10-CM | POA: Insufficient documentation

## 2020-02-14 DIAGNOSIS — J438 Other emphysema: Secondary | ICD-10-CM

## 2020-02-14 DIAGNOSIS — Z9889 Other specified postprocedural states: Secondary | ICD-10-CM

## 2020-02-14 NOTE — Progress Notes (Signed)
1.  No suspicious pulmonary nodules.  2.  Moderate emphysematous changes.  3.  Mild coronary artery calcification.    Lung RADS Assessment Category: Negative - 1    Will repeat lung screening in 1 year and notify patient accordingly.  Encourage smoking cessation and continued adherence to annual lung cancer screenings to reduce risk of developing lung cancer.  Recommend following up with PCP and/or specialist for other incidental findings as an additional workup may be considered.  PCP notified.  Patient notified via mychart.    Thanks,     Reva Bores, MSN, APRN, FNP-C, [facility]  Lung Cancer Screening & Tobacco Cessation

## 2020-02-14 NOTE — Ancillary Notes (Signed)
Patient arrived for their low dose ct lung cancer screening. Written smoking cessation materials provided to patient during this visit.       Jeb Levering, RTR 02/14/2020, 11:52

## 2020-02-15 ENCOUNTER — Other Ambulatory Visit (HOSPITAL_COMMUNITY): Payer: Self-pay | Admitting: Cardiovascular Disease

## 2020-02-15 DIAGNOSIS — Z952 Presence of prosthetic heart valve: Secondary | ICD-10-CM

## 2020-02-19 ENCOUNTER — Other Ambulatory Visit (HOSPITAL_BASED_OUTPATIENT_CLINIC_OR_DEPARTMENT_OTHER): Payer: Self-pay | Admitting: Family

## 2020-02-19 DIAGNOSIS — Z87891 Personal history of nicotine dependence: Secondary | ICD-10-CM

## 2020-02-19 DIAGNOSIS — Z122 Encounter for screening for malignant neoplasm of respiratory organs: Secondary | ICD-10-CM

## 2020-02-20 ENCOUNTER — Encounter (HOSPITAL_COMMUNITY): Payer: Self-pay

## 2020-02-20 ENCOUNTER — Ambulatory Visit (HOSPITAL_COMMUNITY): Payer: Self-pay

## 2020-02-20 LAB — PT/INR: INR: 1.8

## 2020-02-20 NOTE — Progress Notes (Signed)
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 pt called. )             Plan:  pt to continue current dosing and recheck INR in two weeks. Dosing verified by pt.     Next INR Date: 03/05/20    Leone Brand, BSN RN  Specialty Care Nurse, Anticoagulation  [facility]  Ph. [phone]  (ext. [phone])  Fax [phone]  [email or web address]        For additional information, see dosing calendar below.    Warfarin Therapy Instructions   April 2021 Details    Sun [name] Thu Fri Sat         1               2               3                 4               5               6               7               8               9               10                 11               12               13               14               15               16               17                 18               19               20    2.0   4 mg   See details      21      4 mg         22      4 mg         23      4 mg         24      4 mg           25      4 mg         26  6 mg         27      4 mg         28      4 mg         29      4 mg         30      4 mg           Date Details   04/20 Last INR check     INR: 2.0              Warfarin Therapy Instructions   May 2021 Details    Sun Mon Tue Wed Thu Fri Sat           1      4 mg           2      4 mg         3      6 mg         4   1.8   4 mg   See details      5      4 mg         6      4 mg         7      4 mg         8      4 mg           9      4 mg         10      6 mg         11      4 mg         12      4 mg         13      4 mg         14      4 mg         15      4 mg           16      4 mg         17      6 mg           18      4 mg         19               20               21               22                 23               24               25               26               27               28               29                  30  31                     Date Details   05/04 This INR check     INR: 1.8       Date of next INR:  03/05/2020

## 2020-02-28 ENCOUNTER — Ambulatory Visit
Admission: RE | Admit: 2020-02-28 | Discharge: 2020-02-28 | Disposition: A | Discharge status: Home / Self Care | Payer: Medicare Other | Source: Ambulatory Visit | Attending: Family | Admitting: Family

## 2020-02-28 ENCOUNTER — Other Ambulatory Visit: Payer: Self-pay

## 2020-02-28 DIAGNOSIS — M8588 Other specified disorders of bone density and structure, other site: Secondary | ICD-10-CM

## 2020-02-28 DIAGNOSIS — Z13820 Encounter for screening for osteoporosis: Secondary | ICD-10-CM

## 2020-02-28 DIAGNOSIS — Z780 Asymptomatic menopausal state: Secondary | ICD-10-CM | POA: Insufficient documentation

## 2020-03-05 ENCOUNTER — Encounter (HOSPITAL_COMMUNITY): Payer: Self-pay

## 2020-03-05 ENCOUNTER — Ambulatory Visit (HOSPITAL_COMMUNITY): Payer: Self-pay

## 2020-03-05 LAB — PT/INR: INR: 1.7

## 2020-03-05 NOTE — Progress Notes (Signed)
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   Clinical Outcomes       Positives:  INR In Range             Plan: 6 mg Monday; 4 mg all other days.  Left message with dosing and testing instructions. Instructed patient to call back with changes, questions or concerns.    Next INR Date: 03/19/2020    Filbert [facility], BSN       For additional information, see dosing calendar below.    Warfarin Therapy Instructions   May 2021 Details    Sun [name] Thu Fri Sat           1                 2               3               4    1.8   4 mg   See details      5      4 mg         6      4 mg         7      4 mg         8      4 mg           9      4 mg         10      6 mg         11      4 mg         12      4 mg         13      4 mg         14      4 mg         15      4 mg           16      4 mg         17      6 mg         18   1.7   4 mg   See details      19      4 mg         20      4 mg         21      4 mg         22      4 mg           23      4 mg         24      6 mg         25      4 mg         26      4  mg         27      4 mg         28      4 mg         29      4 mg           30      4 mg         31      6 mg               Date Details   05/04 Last INR check     INR: 1.8      05/18 This INR check     INR: 1.7              Warfarin Therapy Instructions   June 2021 Details    Sun [name] Thu Fri Sat       1      4 mg         2               3               4               5                 6               7               8               9               10               11               12                 13               14               15               16               17               18               19                  20               21               22               23               24               25               26                 27               28                29  30                   Date Details   No additional details    Date of next INR:  03/19/2020

## 2020-03-06 ENCOUNTER — Encounter (HOSPITAL_BASED_OUTPATIENT_CLINIC_OR_DEPARTMENT_OTHER): Payer: Self-pay | Admitting: Family

## 2020-03-06 ENCOUNTER — Ambulatory Visit: Payer: Medicare Other | Attending: Family | Admitting: Family

## 2020-03-06 VITALS — BP 128/82 | HR 78 | Ht 65.0 in | Wt 226.9 lb

## 2020-03-06 DIAGNOSIS — Z7982 Long term (current) use of aspirin: Secondary | ICD-10-CM | POA: Insufficient documentation

## 2020-03-06 DIAGNOSIS — Z79899 Other long term (current) drug therapy: Secondary | ICD-10-CM | POA: Insufficient documentation

## 2020-03-06 DIAGNOSIS — Z7901 Long term (current) use of anticoagulants: Secondary | ICD-10-CM | POA: Insufficient documentation

## 2020-03-06 DIAGNOSIS — I10 Essential (primary) hypertension: Secondary | ICD-10-CM | POA: Insufficient documentation

## 2020-03-06 NOTE — Progress Notes (Signed)
[facility]: Family Medicine Office Visit  Progress Note    Subjective:   [name] is a 64 y.o. female with hypertension.  Hypertension treatment  HCTZ .  Pertinent history:taking medications as instructed, no medication side effects noted, home BP monitoring in range of 99991111 systolic over Q000111Q diastolic, no TIAs, no chest pain on exertion, no dyspnea on exertion, no swelling of ankles  Lab Results   Component Value Date    SODIUM 138 01/22/2020    POTASSIUM 3.9 01/22/2020    CHLORIDE 101 01/22/2020    CO2 26 01/22/2020    ANIONGAP 11 01/22/2020    BUN 15 01/22/2020    CREATININE 0.79 01/22/2020    BUNCRRATIO 19 01/22/2020    GFR 79 01/22/2020    GLUCOSENF 88 01/22/2020    CHOLESTEROL 194 01/22/2020    HDLCHOL 64 01/22/2020    LDLCHOL 113 (H) 01/22/2020    TRIG 87 01/22/2020       Medication list:  acetaminophen (TYLENOL) 500 mg Oral Tablet, Take 650 mg by mouth Every night   aspirin 81 mg Oral Tablet, Chewable, Take 1 Tab (81 mg total) by mouth Once a day  cholecalciferol, vitamin D3, 250 mcg (10,000 unit) Oral Capsule, Take 10,000 Units by mouth Once a day  clobetasoL (CORMAX) 0.05 % Solution, APPLY SOLUTION TOPICALLY TO AFFECTED AREA TWICE DAILY  clobetasoL (TEMOVATE) 0.05 % Cream, by Apply Topically route Twice daily  ergocalciferol, vitamin D2, (VITAMIN D2 ORAL), Take by mouth  (Patient not taking: Reported on 03/06/2020)  Fluocinolone-Shower Cap 0.01 % Oil, Apply to scalp 1-2 times weekly at night.  fluticasone propionate (FLONASE) 50 mcg/actuation Nasal Spray, Suspension, 1 Spray by Each Nostril route Once a day  hydroCHLOROthiazide (HYDRODIURIL) 25 mg Oral Tablet, Take 1 Tablet (25 mg total) by mouth Once a day  loratadine (CLARITIN) 10 mg Oral Tablet, Take 10 mg by mouth Once a day  metoprolol succinate (TOPROL-XL) 50 mg Oral Tablet Sustained Release 24 hr, Take 1 Tablet (50 mg total) by mouth Once a day  triamcinolone acetonide (ARISTOCORT A) 0.1 % Cream, Apply topically Twice  daily  warfarin (COUMADIN) 2 mg Oral Tablet, TAKE 3 TABLETS (6 MG TOTAL) BY MOUTH ONCE DAILY IN THE EVENING    No facility-administered medications prior to visit.     Review of Systems - as per HPI.   Review of Systems     OBJECTIVE:  BP 128/82   Pulse 78   Ht 1.651 m (5\' 5" )   Wt 103 kg (226 lb 13.7 oz)   SpO2 96%   BMI 37.75 kg/m      Body mass index is 37.75 kg/m.  BP Readings from Last 3 Encounters:   03/06/20 128/82   01/16/20 121/80   01/09/20 122/80     Wt Readings from Last 3 Encounters:   03/06/20 103 kg (226 lb 13.7 oz)   01/16/20 101 kg (222 lb 10.6 oz)   01/09/20 104 kg (228 lb 6.3 oz)     General appearance: in no apparent distress and well developed and well nourished  General exam BP noted to be well controlled today in office, S1, S2 normal, no gallop, no murmur, chest clear, no JVD, no HSM, no edema.     ASSESSMENT:  PLAN:    ICD-10-CM    1. Essential hypertension  I10    Doing well with HCTZ. Had labs in April. BP well controlled. No changes.     Return in about 6 months (  around 09/06/2020) for HTN follow-up.   Patient was seen independently,  physician on site if consultation was needed.    Derek Jack, APRN,FNP-BC  FAMILY MEDICINE, [facility]  Operated by [facility]  [address]  [place] [address]  Dept: [phone]

## 2020-03-19 ENCOUNTER — Ambulatory Visit (HOSPITAL_COMMUNITY): Payer: Self-pay

## 2020-03-19 ENCOUNTER — Encounter (HOSPITAL_COMMUNITY): Payer: Self-pay

## 2020-03-19 LAB — PT/INR
INR: 1.6
INR: 1.6

## 2020-03-19 NOTE — Progress Notes (Signed)
Duplicate entry

## 2020-03-19 NOTE — Progress Notes (Signed)
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   Clinical Outcomes       Positives:  INR In Range             Plan: 6 mg Monday and Thursday; 4 mg all other days. Confirmed dosing and testing with patient.  Next INR Date: 04/02/2020    Filbert [facility], BSN       For additional information, see dosing calendar below.    Warfarin Therapy Instructions   May 2021 Details    Sun [name] Thu Fri Sat           1                 2               3               4               5               6               7               8                 9               10               11               12               13               14               15                 16               17               18    1.7   4 mg   See details      19      4 mg         20      4 mg         21      4 mg         22      4 mg           23      4 mg         24      6 mg         25      4 mg         26      4 mg         27      4 mg         28  4 mg         29      4 mg           30      4 mg         31      6 mg               Date Details   05/18 Last INR check     INR: 1.7              Warfarin Therapy Instructions   June 2021 Details    Sun Mon Tue Wed Thu Fri Sat       1   1.6   4 mg   See details      2      4 mg         3      6 mg         4      4 mg         5      4 mg           6      4 mg         7      6 mg         8      4 mg         9      4 mg         10      6 mg         11      4 mg         12      4 mg           13      4 mg         14      6 mg         15      4 mg         16               17               18               19                 20               21               22               23               24               25               26                  27               28               29               30                    Date Details  06/01 This INR check     INR: 1.6     Additional INRs: 1.6       Date of next INR:  04/02/2020

## 2020-03-21 ENCOUNTER — Encounter

## 2020-04-02 ENCOUNTER — Ambulatory Visit (HOSPITAL_COMMUNITY): Payer: Self-pay

## 2020-04-02 ENCOUNTER — Encounter (HOSPITAL_COMMUNITY): Payer: Self-pay

## 2020-04-02 LAB — PT/INR: INR: 1.9

## 2020-04-02 NOTE — Progress Notes (Signed)
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 pt called. )          Plan:  pt to continue current dosing and recheck INR in two weeks. Dosing verified by pt.     Next INR Date: 04/16/20    Leone Brand, BSN RN  Specialty Care Nurse, Anticoagulation  [facility]  Ph. [phone]  (ext. [phone])  Fax [phone]  [email or web address]        For additional information, see dosing calendar below.    Warfarin Therapy Instructions   June 2021 Details    Sun [name] Thu Fri Sat       1   1.6   4 mg   See details      2      4 mg         3      6 mg         4      4 mg         5      4 mg           6      4 mg         7      6 mg         8      4 mg         9      4 mg         10      6 mg         11      4 mg         12      4 mg           13      4 mg         14      6 mg         15   1.9   4 mg   See details      16      4 mg         17      6 mg         18      4 mg         19      4 mg           20      4 mg         21      6 mg         22      4 mg         23      4 mg         24      6 mg         25      4 mg  26      4 mg           27      4 mg         28      6 mg         29      4 mg         30                   Date Details   06/01 Last INR check   INR: 1.6   Additional INRs: 1.6      06/15 This INR check   INR: 1.9       Date of next INR:  04/16/2020

## 2020-04-16 ENCOUNTER — Encounter (HOSPITAL_COMMUNITY): Payer: Self-pay

## 2020-04-16 ENCOUNTER — Ambulatory Visit (HOSPITAL_COMMUNITY): Payer: Self-pay

## 2020-04-16 LAB — PT/INR: INR: 2.2

## 2020-04-16 NOTE — Progress Notes (Cosign Needed)
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   Clinical Outcomes     Positives:  INR In Range          Plan: 6 mg Monday and Thursday; 4 mg all other days. Confirmed dosing and testing with patient.  Next INR Date: 04/30/2020    Filbert [facility], BSN       For additional information, see dosing calendar below.    Warfarin Therapy Instructions   June 2021 Details    Sun [name] Thu Fri Sat       1               2               3               4               5                 6               7               8               9               10               11               12                 13               14               15    1.9   4 mg   See details      16      4 mg         17      6 mg         18      4 mg         19      4 mg           20      4 mg         21      6 mg         22      4 mg         23      4 mg         24      6 mg         25      4 mg         26      4 mg           27      4 mg         28  6 mg         29   2.2   4 mg   See details      30      4 mg             Date Details   06/15 Last INR check   INR: 1.9      06/29 This INR check   INR: 2.2              Warfarin Therapy Instructions   July 2021 Details    Sun Mon Tue Wed Thu Fri Sat         1      6 mg         2      4 mg         3      4 mg           4      4 mg         5      6 mg         6      4 mg         7      4 mg         8      6 mg         9      4 mg         10      4 mg           11      4 mg         12      6 mg         13      4 mg         14               15               16               17                 18               19               20               21               22               23               24                  25               26               27               28               29               30               31                 Date  Details   No additional details    Date of next INR:  04/30/2020

## 2020-04-26 NOTE — Anesthesia Preprocedure Evaluation (Addendum)
ANESTHESIA PRE-OP EVALUATION  Planned Procedure: COLONOSCOPY (N/A Anus)  Review of Systems                   Pulmonary     Cardiovascular    No peripheral edema,        GI/Hepatic/Renal        Endo/Other          Neuro/Psych/MS        Cancer                     Physical Assessment      Airway         TM distance: >3 FB    Neck ROM: full    No Facial hair  No Beard  No endotracheal tube present  No Tracheostomy present    Dental           (+) caps, chipped           Pulmonary    Breath sounds clear to auscultation  (-) no rhonchi, no decreased breath sounds, no wheezes, no rales and no stridor     Cardiovascular    Rhythm: regular  Rate: Normal  (-) no friction rub, no peripheral edema and no murmur     Other findings            Plan  ASA 3     Planned anesthesia type: MAC                    Anesthetic plan and risks discussed with patient.                                  EKG Ordered: 04/29/2020  CXR: Not ordered   CT Lung cancer screening 02/14/2020  Lungs:  There is moderate emphysematous change in the bilateral lungs.  There is no focal consolidation, pleural effusion, or pneumothorax. The  central airways are clear.      Other Studies: Labs 04/29/2020    Echo 01/08/2020  Conclusions:  Normal left ventricular size. Concentric remodeling. Left ventricular systolic function is normal. Ejection Fraction is 63.1 %. No segmental/regional wall motion abnormalities identified. Left ventricular diastolic parameters are normal.  Right Ventricle: Normal right ventricular size. Normal right ventricular systolic function. Right ventricular systolic pressure is indeterminate.  The aortic valve is not well visualized. No Aortic valve stenosis. There is mild aortic regurgitation. AVR, 60m On-X, mechanical valve isnoted. The mean and peak gradients across AV of 9 mm Hg and 16 mm Hg. Peak velocity across AV of 2 m/sec. DVI of 0.46 and AT of 60 msec1      Consults: None    Patient instructed to take the following medications day of  surgery, tylenol, aspirin, Claritin and, if needed flonase.  Patient to continue aspirin.  Patient instructed to hold coumadin 5 days prior to procedure, per SAlda Berthold MD  Patient instructed to avoid NSAIDs 7 days prior to procedure.  Patient provided anesthesia consent in PEC. Instructed to review consent prior to OR date. Educated that consent will be signed morning of surgery with anesthesiologist.

## 2020-04-29 ENCOUNTER — Ambulatory Visit (HOSPITAL_COMMUNITY)
Admission: RE | Admit: 2020-04-29 | Discharge: 2020-04-29 | Disposition: A | Discharge status: Home / Self Care | Payer: Medicare Other | Source: Ambulatory Visit

## 2020-04-29 ENCOUNTER — Ambulatory Visit
Admission: RE | Admit: 2020-04-29 | Discharge: 2020-04-29 | Disposition: A | Discharge status: Home / Self Care | Payer: Medicare Other | Source: Ambulatory Visit | Attending: Family | Admitting: Family

## 2020-04-29 ENCOUNTER — Ambulatory Visit (INDEPENDENT_AMBULATORY_CARE_PROVIDER_SITE_OTHER): Payer: Medicare Other | Admitting: Rheumatology

## 2020-04-29 ENCOUNTER — Other Ambulatory Visit: Payer: Self-pay

## 2020-04-29 ENCOUNTER — Encounter (HOSPITAL_COMMUNITY): Payer: Self-pay

## 2020-04-29 VITALS — BP 129/83 | HR 90 | Temp 97.5°F | Resp 18 | Ht 66.06 in | Wt 223.1 lb

## 2020-04-29 DIAGNOSIS — I4891 Unspecified atrial fibrillation: Secondary | ICD-10-CM

## 2020-04-29 DIAGNOSIS — K469 Unspecified abdominal hernia without obstruction or gangrene: Secondary | ICD-10-CM

## 2020-04-29 DIAGNOSIS — Z7901 Long term (current) use of anticoagulants: Secondary | ICD-10-CM

## 2020-04-29 DIAGNOSIS — Z01818 Encounter for other preprocedural examination: Secondary | ICD-10-CM | POA: Insufficient documentation

## 2020-04-29 DIAGNOSIS — M539 Dorsopathy, unspecified: Secondary | ICD-10-CM

## 2020-04-29 DIAGNOSIS — R21 Rash and other nonspecific skin eruption: Secondary | ICD-10-CM

## 2020-04-29 DIAGNOSIS — Z01810 Encounter for preprocedural cardiovascular examination: Secondary | ICD-10-CM

## 2020-04-29 DIAGNOSIS — G8929 Other chronic pain: Secondary | ICD-10-CM

## 2020-04-29 HISTORY — DX: Unspecified atrial fibrillation: I48.91

## 2020-04-29 HISTORY — DX: Long term (current) use of anticoagulants: Z79.01

## 2020-04-29 HISTORY — DX: Other chronic pain: G89.29

## 2020-04-29 HISTORY — DX: Other long term (current) drug therapy: Z79.899

## 2020-04-29 HISTORY — DX: Heart disease, unspecified: I51.9

## 2020-04-29 HISTORY — DX: Anemia, unspecified: D64.9

## 2020-04-29 HISTORY — DX: Rash and other nonspecific skin eruption: R21

## 2020-04-29 HISTORY — DX: Unspecified abdominal hernia without obstruction or gangrene: K46.9

## 2020-04-29 HISTORY — DX: Dorsopathy, unspecified: M53.9

## 2020-04-29 LAB — CBC
HCT: 44 % (ref 34.8–46.0)
HGB: 14.3 g/dL (ref 11.5–16.0)
MCH: 29.3 pg (ref 26.0–32.0)
MCHC: 32.5 g/dL (ref 31.0–35.5)
MCV: 90.2 fL (ref 78.0–100.0)
MPV: 8.8 fL (ref 8.7–12.5)
PLATELETS: 263 10*3/uL (ref 150–400)
RBC: 4.88 10*6/uL (ref 3.85–5.22)
RDW-CV: 12 % (ref 11.5–15.5)
WBC: 6 10*3/uL (ref 3.7–11.0)

## 2020-04-29 LAB — ECG 12-LEAD (PERFORMED IN PREADMISSION UNIT ONLY)
Atrial Rate: 76 {beats}/min
Calculated P Axis: 57 degrees
Calculated R Axis: 61 degrees
Calculated T Axis: 75 degrees
PR Interval: 132 ms
QRS Duration: 92 ms
QT Interval: 404 ms
QTC Calculation: 454 ms
Ventricular rate: 76 {beats}/min

## 2020-04-29 LAB — BASIC METABOLIC PANEL
ANION GAP: 7 mmol/L (ref 4–13)
BUN/CREA RATIO: 22 (ref 6–22)
BUN: 17 mg/dL (ref 8–25)
CALCIUM: 9.6 mg/dL (ref 8.8–10.2)
CHLORIDE: 97 mmol/L (ref 96–111)
CO2 TOTAL: 29 mmol/L (ref 23–31)
CREATININE: 0.76 mg/dL (ref 0.60–1.05)
ESTIMATED GFR: 83 mL/min/BSA (ref >=60)
GLUCOSE: 92 mg/dL (ref 65–125)
POTASSIUM: 4.2 mmol/L (ref 3.5–5.1)
SODIUM: 133 mmol/L — ABNORMAL LOW (ref 136–145)

## 2020-04-29 LAB — POC BLOOD GLUCOSE (RESULTS): GLUCOSE, POC: 109 mg/dL — ABNORMAL HIGH (ref 70–105)

## 2020-04-30 ENCOUNTER — Telehealth (HOSPITAL_BASED_OUTPATIENT_CLINIC_OR_DEPARTMENT_OTHER): Payer: Self-pay | Admitting: Orthopaedic Surgery

## 2020-04-30 ENCOUNTER — Ambulatory Visit (HOSPITAL_COMMUNITY): Payer: Self-pay

## 2020-04-30 ENCOUNTER — Encounter (HOSPITAL_COMMUNITY): Payer: Self-pay

## 2020-04-30 LAB — PT/INR: INR: 1.8

## 2020-04-30 NOTE — Telephone Encounter (Signed)
Returned call to patient she was inquiring about antibiotics prior to her colonoscopy.  Her joint replacement was in 2017.  Discussed with Dr. Sharlene Motts and he is fine with no antibiotics prior to her colonoscopy this far out from her joint replacement.  She verbalized understanding and thanked me for the call.

## 2020-04-30 NOTE — Progress Notes (Signed)
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 Patient will be having colonoscopy in 2 weeks.   Clinical Outcomes     Positives:  INR In Range        Patient Findings     Positives:  Upcoming invasive procedure (notified/discussed with Dr. Sandi Carne)        Plan: 6 mg Monday and Thursday; 4 mg all other days. Patient will hold warfarin for 5 days prior to procedure and restart warfarin per preforming provider.   Stop warfarin 07/22; restart 07/27 6 mg, if preforming provider feels you are able to, from a bleeding stand point. Take 6 mg Wednesday and Thursday; test INR Friday. Confirmed dosing/holding and testing instructions with patient.  Next INR Date: 05/17/2020    Filbert Berthold, BSN       For additional information, see dosing calendar below.    Warfarin Therapy Instructions   June 2021 Details    Sun [name] Thu Fri Sat       1               2               3               4               5                 6               7               8               9               10               11               12                 13               14               15               16               17               18               19                 20               21               22               23                24  25               26                 27               28               29    2.2   4 mg   See details      30      4 mg             Date Details   06/29 Last INR check   INR: 2.2              Warfarin Therapy Instructions   July 2021 Details    Sun Mon Tue Wed Thu Fri Sat         1      6 mg         2      4 mg         3      4 mg           4      4 mg         5      6 mg         6      4 mg          7      4 mg         8      6 mg         9      4 mg         10      4 mg           11      4 mg         12      6 mg         13   1.8   4 mg   See details      14      4 mg         15      6 mg         16      4 mg         17      4 mg           18      4 mg         19      6 mg         20      4 mg         21      4 mg         22      Hold         23      Hold         24      Hold           25      Hold         26      Hold         27      6 mg         28      6 mg  29      6 mg         30      4 mg         31                Date Details   07/13 This INR check   INR: 1.8       Date of next INR:  05/17/2020

## 2020-05-14 ENCOUNTER — Ambulatory Visit (HOSPITAL_BASED_OUTPATIENT_CLINIC_OR_DEPARTMENT_OTHER): Payer: Medicare Other | Admitting: Anesthesiology

## 2020-05-14 ENCOUNTER — Encounter (HOSPITAL_COMMUNITY)
Admission: RE | Disposition: A | Discharge status: Home / Self Care | Payer: Self-pay | Source: Ambulatory Visit | Attending: Gastroenterology

## 2020-05-14 ENCOUNTER — Ambulatory Visit (HOSPITAL_COMMUNITY): Payer: Medicare Other | Admitting: Family

## 2020-05-14 ENCOUNTER — Ambulatory Visit (HOSPITAL_COMMUNITY): Payer: Medicare Other | Admitting: Gastroenterology

## 2020-05-14 ENCOUNTER — Encounter (HOSPITAL_COMMUNITY): Payer: Self-pay | Admitting: Gastroenterology

## 2020-05-14 ENCOUNTER — Other Ambulatory Visit: Payer: Self-pay

## 2020-05-14 ENCOUNTER — Inpatient Hospital Stay
Admission: RE | Admit: 2020-05-14 | Discharge: 2020-05-14 | Disposition: A | Discharge status: Home / Self Care | Payer: Medicare Other | Source: Ambulatory Visit | Attending: Gastroenterology | Admitting: Gastroenterology

## 2020-05-14 DIAGNOSIS — K5730 Diverticulosis of large intestine without perforation or abscess without bleeding: Secondary | ICD-10-CM

## 2020-05-14 DIAGNOSIS — R195 Other fecal abnormalities: Secondary | ICD-10-CM

## 2020-05-14 SURGERY — COLONOSCOPY
Anesthesia: Monitor Anesthesia Care | Site: Anus | Wound class: Clean Contaminated Wounds-The respiratory, GI, Genital, or urinary

## 2020-05-14 MED ORDER — LACTATED RINGERS INTRAVENOUS SOLUTION
INTRAVENOUS | Status: DC
Start: 2020-05-14 — End: 2020-05-14
  Administered 2020-05-14: 0 mL via INTRAVENOUS

## 2020-05-14 MED ORDER — PROPOFOL 10 MG/ML INTRAVENOUS EMULSION
INTRAVENOUS | Status: DC | PRN
Start: 2020-05-14 — End: 2020-05-14
  Administered 2020-05-14: 200 ug/kg/min via INTRAVENOUS
  Administered 2020-05-14: 300 ug/kg/min via INTRAVENOUS
  Administered 2020-05-14: 200 ug/kg/min via INTRAVENOUS

## 2020-05-14 MED ORDER — PROPOFOL 10 MG/ML IV BOLUS
INJECTION | Freq: Once | INTRAVENOUS | Status: DC | PRN
Start: 2020-05-14 — End: 2020-05-14
  Administered 2020-05-14: 50 mg via INTRAVENOUS

## 2020-05-14 MED ORDER — SODIUM CHLORIDE 0.9 % (FLUSH) INJECTION SYRINGE
2.0000 mL | INJECTION | Freq: Three times a day (TID) | INTRAMUSCULAR | Status: DC
Start: 2020-05-14 — End: 2020-05-14

## 2020-05-14 MED ORDER — SODIUM CHLORIDE 0.9 % (FLUSH) INJECTION SYRINGE
2.0000 mL | INJECTION | INTRAMUSCULAR | Status: DC | PRN
Start: 2020-05-14 — End: 2020-05-14

## 2020-05-14 SURGICAL SUPPLY — 69 items
ATTACHMENT ENDOSCP 4MM 13.4MM RND EDGE STRL LF  DISP (GENE) IMPLANT
ATTACHMENT ENDOSCP 4MM 15MM RND EDGE STRL LF  DISP (ENDOSCOPIC SUPPLIES) IMPLANT
CATH CRE 10-11-12MM 7.5FR 5.5CM 240CM 2.8MM 3.2MM LOW PROF GW BAL DIL ESOPH PYL BIL PEBAX STRL LF (BALLOON) IMPLANT
CATH ELHMST GLD PROBE 10FR 300CM BIPOLAR RND DIST TIP STD CONN FIRM SHAFT HMGLD STRL DISP 3.7MM MN (DIAGNOSTIC) IMPLANT
CATH ELHMST GLD PROBE 7FR 300CM BIPOLAR RND DIST TIP STD CONN FIRM SHAFT HMGLD STRL DISP 2.8MM MN (DIAGNOSTIC) IMPLANT
CATH ELHMST GLD PROBE 7FR 300C_M BIPOLAR RND DIST TIP STD (DIAGNOSTIC)
CLIP HMST MR CONDITIONAL BRD CATH ROT CONTROL KNOB NO SHEATH RSL 360 235CM 2.8MM 11MM OPN (SURGICAL INSTRUMENTS) IMPLANT
CONV USE ITEM 343591 - SOLIDIFY FLUID 1500ML DSPNSR L_Q TX SOLIDIFY SFTP LTS+ DISP (STER) IMPLANT
DILATOR ENDOS CRE 240CM 5.5CM 11-13.5-15MM 7.5FR ESOPH PYL BIL BAL LOW PROF GW PEBAX STRL LF  DISP (BALLOON) IMPLANT
DILATOR ENDOS CRE 240CM 5.5CM 15-16.5-18MM 7.5FR ESOPH PYL BIL BAL LOW PROF GW PEBAX STRL LF  DISP (BALLOON) IMPLANT
DILATOR ENDOS CRE 240CM 5.5CM_11-13.5-15MM 7.5FR ESOPH PYL (BALLOON)
DISC USE ITEM 82101 - TUBING OXYGEN 50/CS 001302 (TUBE/TUBING & SUCTION SUPPLIES)
DISCONTINUED USE ITEM 309153 - TRAP SPECI ARGYLE 40CC GRAD SC_REW ON CAP REM MALE CONN (Cautery Accessories) IMPLANT
DISCONTINUED USE ITEM 339015 - CONTAINR STRL 10% NEUT BF FRMLN POLYPROP GRAD LEAK RST ORNG PREFL SCREW CAP FSHR HLTHCR PRTCL GRN (CHEM) IMPLANT
DISCONTINUED USE ITEM 82101 - TUBING OXYGEN 50/CS 001302 (TUBE/TUBING & SUCTION SUPPLIES) IMPLANT
ELECTRODE ESURG 165CM ITKNIFE NANO STRL CERM 3.5MM DISP INSL TIP LF  ACPT 2.8MM CHNL ENDOS (ENDOSCOPIC SUPPLIES) IMPLANT
ELECTRODE ESURG 165CM TRIANGLETIP KNIFE STRL CERM 4.5MM DISP LF  ACPT 2.8MM CHNL ENDOS SUBMUCOSAL (ENDOSCOPIC SUPPLIES) IMPLANT
ELECTRODE ESURG 230CM DUALKNIFE J STRL CERM 1.5MM DISP BUIL IN HNDL FLUID INJ LF  ACPT 2.8MM CHNL (Cautery Accessories) IMPLANT
ELECTRODE ESURG 230CM ITKNIFE NANO STRL CERM 3.5MM DISP INSL TIP LF  ACPT 2.8MM CHNL ENDOS (CAUTERY SUPPLIES) IMPLANT
ELECTRODE PATIENT RTN 9FT VLAB C30- LB RM PHSV ACRL FOAM CORD NONIRRITATE NONSENSITIZE ADH STRP (CAUTERY SUPPLIES) IMPLANT
ENDO KIT CS/20 (INSTRUMENTS ENDOMECHANICAL) ×1
ENDO KIT_CS/20 (ENDOSCOPIC SUPPLIES) ×1 IMPLANT
FORCEPS BIOPSY 240CM 2.4MM RJ 4 2.8MM LRG CPC DISP ORNG (GUIDING)
FORCEPS BIOPSY MICROMESH TTH STREAMLINE CATH 240CM 2.4MM RJ 4 SS LRG CPC STRL DISP ORNG 2.8MM WRK (GUIDING)
FORCEPS BIOPSY NEEDLE 160CM 1. 8MM RJ 4 PED 2+ MM DISP (INSTRUMENTS)
FORCEPS BIOPSY NEEDLE 160CM 1.8MM RJ 4 DISP YW 2MM WRK CHNL GSPED (SURGICAL INSTRUMENTS) IMPLANT
FORCEPS BIOPSY NEEDLE 240CM 2.2MM RJ 4 2.8MM STD CPC STRL DISP ORNG (SURGICAL INSTRUMENTS) IMPLANT
FORCEPS ENDOS 240CM 2.8MM RJ 4 JMB DISP (INSTRUMENTS)
FORCEPS ENDOS 240CM 2.8MM RJ 4 JMB DISP (SURGICAL INSTRUMENTS) IMPLANT
FORCEPS ENDOS HMST GRSPR MONOP OL 165CM 2.75MM COAGRASPER 2.8 (INSTRUMENTS ENDOMECHANICAL)
FORCEPS ESURG 165X5MM COAGRASPER HMST STRL LF  DISP (ENDOSCOPIC SUPPLIES) IMPLANT
FORCEPS ESURG 230X4MM COAGRASPER HMST STRL LF  DISP (ENDOSCOPIC SUPPLIES) IMPLANT
FORMALIN 10% BUFF 8ML 23032059 100EA/PK (CHEM)
GOWN SURG XL AAMI L3 NONREINFO_RCE HKLP CLSR STRL LTX PNK SMS (DGOW)
GOWN SURG XL L3 NONREINFORCE HKLP CLSR STRL LTX PNK SMS 47IN (DGOW) IMPLANT
JELLY LUB PDI BCTRST H2O SOL N ONSTAIN NGRS STRL GLYC MTHY (INSTRUMENTS)
JELLY LUB PDI BCTRST H2O SOL N ONSTAIN NGRS STRL GLYC MTHY (SURGICAL INSTRUMENTS) IMPLANT
KIT ENDOS ENDOZIME BDSD SLR SPONGE ENZM DETERGENT 500ML (DIS) IMPLANT
KNIFE ENDOS 165CM 1.7MM .9MM I TKNIFE NANO CERM SM INSL TIP (INSTRUMENTS ENDOMECHANICAL)
KNIFE ESURG 165CM 2.8MM 3ANG T_IP 4.5MM STRL DISP (INSTRUMENTS ENDOMECHANICAL)
LIGATOR DEV STRL DISP ENDOS LF (ENDOSCOPIC SUPPLIES) IMPLANT
LINER SUCT RD CRD MEDIVAC TW LOCK LID SHTOF VALVE CAN FILTER 1500CC LF  DISP (Suction) IMPLANT
LOOP HLDR DTCH AUTOCLAV 30MM SURG NYL PLPCTM NONST LF  DISP (ENDOSCOPIC SUPPLIES) IMPLANT
MARKER ENDOS SPOT EX PERM IND DRK SYRG 5ML (GENE) IMPLANT
MARKER ENDOS SPOT EX PREFL PRE ASSEMBLE SYRG PERM (GENE)
NEEDLE ENDOS 5MM 25GA 2.5MM SS TFLN 230CM INJ PRJ SHEATH LL SPRG LD HNDL CRLK STRL LF  DISP (NEEDLES & SYRINGE SUPPLIES) IMPLANT
NEEDLE SCLRTX 25GA 2.3MM OPTC TIP SHTH STRL LF DISP YW (NEEDLES & SYRINGE SUPPLIES) IMPLANT
NET SPEC RETR 160CM 3MM RTHNT MAXI SHEATH 8X4CM NONST LF  DISP (Dilators) IMPLANT
PROBE COAG 7.2FT 6.9FR FIAPC CRCMF PLUG PLAY FUNCTIONALITY FILTER (ENDOSCOPIC SUPPLIES) IMPLANT
PROBE ESURG 220CM 2.3MM FIAPC FLXB STR FIRE STRL DISP (CAUTERY SUPPLIES) IMPLANT
PROBE ESURG 220CM 2.3MM FIAPC_FLXB STR FIRE ARGON PLAS COAG (CAUTERY SUPPLIES)
RETRIEVER 230CM 2.5MM ALT WV N T ENDOS NONST RTHNT 6X3CM (INSTRUMENTS ENDOMECHANICAL)
RETRIEVER ENDOS 160CM 1.8MM RTHNT MINI SM CATH SHEATH 4.5X2CM NONST (Dilators) IMPLANT
RETRIEVER ENDOS 230CM 2.5MM RTHNT ALT WV 6X3CM (ENDOSCOPIC SUPPLIES) IMPLANT
SNARE 9MM 230CM 2.4MM EXACTO COLD BRD WRE CLEAN CUT ENDOS PLC RESCT STRL LF  DISP (ENDOSCOPIC SUPPLIES) IMPLANT
SNARE 9MM 230CM 2.4MM EXACTO C_OLD BRD WRE CLEAN CUT ENDOS (INSTRUMENTS ENDOMECHANICAL)
SNARE MED OVAL 240CM 2.4MM SNS LOOP SHRTHRW FLXB ENDOS 2.8MM WRK CHNL PLPCTM 27MM STRL DISP (ENDOSCOPIC SUPPLIES) IMPLANT
SNARE SM OVAL 240CM 2.4MM SNS LOOP SHRTHRW FLXB ENDOS PLPCTM 13MM STRL LF  DISP (DIAGNOSTIC) IMPLANT
SNARE SM OVL 240CM 2.4MM SNS L OOP SHRTHRW FLXB ENDOS PLPCTM (DIAGNOSTIC)
SOLIDIFY FLUID 1500ML DSPNSR L Q TX SOLIDIFY SFTP LTS+ DISP (STER)
SOLIDIFY FLUID 1500ML DSPNSR L_Q TX SOLIDIFY SFTP LTS+ DISP (STER)
SOLUTION IRRG H2O 500CC 2F7113 (SOLUTIONS) ×1
SYRINGE INFLAT ALN II GA STRL DISP 60ML (NEEDLES & SYRINGE SUPPLIES) IMPLANT
SYRINGE LL 50ML LF  STRL GRAD N-PYRG DEHP-FR PVC FREE MED DISP CLR (NEEDLES & SYRINGE SUPPLIES) IMPLANT
TRAP SPEC RETR ETRAP MAGNIFY W IND MSR GUIDE PLYP LF DISP (GI LAB SUPPLIES) IMPLANT
TRAP SPECI ARGYLE 40CC GRAD SC REW ON CAP REM MALE CONN (Cautery Accessories)
TUBING SUCT CLR 20FT 9/32IN MEDIVAC NCDTV M/M CONN STRL LF (Suction) IMPLANT
USE ITEM 43607 SNARE MED OVAL 240CM 2.4MM SNS LOOP SHRTHRW FLXB ENDOS 2.8MM WRK CHNL PLPCTM 27MM STRL DISP (ENDOSCOPIC SUPPLIES) IMPLANT
WATER STRL 500ML PLASTIC PR BTL LF (SOLUTIONS) ×1 IMPLANT

## 2020-05-14 NOTE — H&P (Signed)
[facility]  GI Admission History and Physical      [name], [name]   MRN:  [id number]  Date of Birth:  1955-12-30    Date of Procedure:  05/14/2020    Chief Complaint:  Positive cologuard    HPI: [name], [name] is a 64 y.o. year old female who presents today for Colonoscopy.   This is the patient's 1st colonoscopy.    This procedure is being done to evaluate  Positive cologuard .    The patient denies abdominal pain, family history of colon cancer, hematochezia, melena or unintentional weight loss.    Past Medical History:   Diagnosis Date    Abdominal hernia 04/29/2020    hx of 3 repairs    Anemia     Aortic stenosis     Arthritis     Atrial fibrillation (CMS HCC) 04/29/2020    one episode  after AVR    Back problem 04/29/2020    LBP    Beta blocker prescribed for left ventricular systolic dysfunction     Blood in stool     positive cologuard.  Has not yet scheduled colonoscopy    Blood thinned due to long-term anticoagulant use 04/29/2020    coumadin    Cancer (CMS HCC)     skin cancer L lip.  Needs MOHS to be scheduled    Chronic pain 04/29/2020    back and right hip    Colon polyp     Heart murmur 01/04/2019    AVR    High blood pressure     Motion sickness     Obesity     Rash 04/29/2020    scalp psoriasis and involves R ear    Valvular disease     aortic valve stenosis, following with CT surgery, echo 02/2016 and cath 03/2016    Wears glasses            Allergies   Allergen Reactions    Iv Contrast Anaphylaxis    Oregano      Hives      Stadol [Butorphanol Tartrate] Mental Status Effect    Tomato Hives/ Urticaria       Medications Prior to Admission       Prescriptions    acetaminophen (TYLENOL) 500 mg Oral Tablet    Take 650 mg by mouth Every night     aspirin 81 mg Oral Tablet, Chewable    Take 1 Tab (81 mg total) by mouth Once a day    Cholecalciferol, Vitamin D3, 50 mcg (2,000 unit) Oral Capsule    Take 2,000 Units by mouth Once a day     clobetasoL (CORMAX) 0.05 % Solution    APPLY SOLUTION  TOPICALLY TO AFFECTED AREA TWICE DAILY    clobetasoL (TEMOVATE) 0.05 % Cream    by Apply Topically route Twice daily    Fluocinolone-Shower Cap 0.01 % Oil    Apply to scalp 1-2 times weekly at night.    fluticasone propionate (FLONASE) 50 mcg/actuation Nasal Spray, Suspension    1 Spray by Each Nostril route Once a day    hydroCHLOROthiazide (HYDRODIURIL) 25 mg Oral Tablet    Take 1 Tablet (25 mg total) by mouth Once a day    loratadine (CLARITIN) 10 mg Oral Tablet    Take 10 mg by mouth Once a day    metoprolol succinate (TOPROL-XL) 50 mg Oral Tablet Sustained Release 24 hr    Take 1 Tablet (50 mg total) by mouth Once  a day    triamcinolone acetonide (ARISTOCORT A) 0.1 % Cream    Apply topically Twice daily    warfarin (COUMADIN) 2 mg Oral Tablet    TAKE 3 TABLETS (6 MG TOTAL) BY MOUTH ONCE DAILY IN THE EVENING             Past Surgical History:   Procedure Laterality Date    HX AORTIC VALVE REPLACEMENT      HX CESAREAN SECTION  1984    HX HEART CATHETERIZATION      x2 with no intervention per pt report    HX HERNIA REPAIR  2004, 2006, 2008    x3    HX HIP REPLACEMENT Left 08/11/2016    HX HYSTERECTOMY  2003    HX OOPHORECTOMY      1 ovary           Physical Exam:  HEENT: Airway patent, Trachea midline, No large goiters or neck lymphadenopathy  Heart: Regular rate and rhythm, no murmurs  Lungs: Clear to auscultation bilaterally without decreased breath sounds, rhonchi, or wheezes  Abdomen: Soft, nondistended, positive bowel sounds, nontender      Assessment:   Positive cologuard    Plan:  Proceed with Colonoscopy. The patients bowel preparation consisted of  GoLytely.     Orders Placed This Encounter    [place] IV ACCESS    PERIPHERAL IV DRESSING CHANGE    AND Linked Order Group     NS flush syringe     NS flush syringe    LR premix infusion         Y Roseanne Kaufman, MD  [facility] Gastroenterology Fellow, PGY-5  =====================================================  05/14/2020    I  have seen and examined the patient.  I reviewed the fellow's note.  I agree with the findings and plan of care as documented in the fellow's note.  Any exceptions/additions are edited/noted.    To comply with the [place] [employer] and Accreditation's mandate for medication reconciliation, the process of identifying the most accurate list of all medications the patient is taking was done by nursing staff through patient's self reported medication usage. While I attest to the medication reconciliation being done, my acknowledgement doesn't in anyway convey or affirm that I am the prescriber of these medications and can't attest to the safety, efficacy or interactions of these medications, and that responsibility rests solely with the prescribing physician    +Cologuard    Sharyn Creamer, MD, Marval Regal, AGAF   05/14/2020, 09:57  Associate Professor of Medicine  Gastroenterology and Hepatology  [address]

## 2020-05-14 NOTE — Anesthesia Transfer of Care (Signed)
ANESTHESIA TRANSFER OF CARE   [name] is a 64 y.o. ,female, Weight: 102 kg (224 lb 3.3 oz)   had Procedure(s):  COLONOSCOPY  performed  05/14/20   Primary Service: Sharyn Creamer, MD    Past Medical History:   Diagnosis Date    Abdominal hernia 04/29/2020    hx of 3 repairs    Anemia     Aortic stenosis     Arthritis     Atrial fibrillation (CMS HCC) 04/29/2020    one episode  after AVR    Back problem 04/29/2020    LBP    Beta blocker prescribed for left ventricular systolic dysfunction     Blood in stool     positive cologuard.  Has not yet scheduled colonoscopy    Blood thinned due to long-term anticoagulant use 04/29/2020    coumadin    Cancer (CMS HCC)     skin cancer L lip.  Needs MOHS to be scheduled    Chronic pain 04/29/2020    back and right hip    Colon polyp     Heart murmur 01/04/2019    AVR    High blood pressure     Motion sickness     Obesity     Rash 04/29/2020    scalp psoriasis and involves R ear    Valvular disease     aortic valve stenosis, following with CT surgery, echo 02/2016 and cath 03/2016    Wears glasses       Allergy History as of 05/14/20     BUTORPHANOL TARTRATE       Noted Status Severity Type Reaction    01/08/16 1311 Flossie Buffy 01/08/16 Active   Mental Status Effect          IV CONTRAST       Noted Status Severity Type Reaction    01/08/16 1312 Flossie Buffy 01/08/16 Active High  Anaphylaxis          TOMATO       Noted Status Severity Type Reaction    01/08/16 1312 Flossie Buffy 01/08/16 Active Low  Hives/ Urticaria          OREGANO       Noted Status Severity Type Reaction    08/04/16 1129 Rozzell, Kelly Splinter, RN 01/08/16 Active       Comments: Hives       01/08/16 1312 Flossie Buffy 01/08/16 Active                 I completed my transfer of care / handoff to the receiving personnel during which we discussed:  Access, Airway, All key/critical aspects of case discussed, Antibiotics, Analgesia, Expectation of post procedure, Fluids/Product, Gave  opportunity for questions and acknowledgement of understanding, Labs and PMHx    Post Location: Phase II                                          Additional Info:Report to RN, Care transferred to RN  Pt VVS                        Last OR Temp: Temperature: 36 C (96.8 F)  ABG:  PH (ARTERIAL)   Date Value Ref Range Status   01/04/2019 7.33 (L) 7.35 - 7.45 Final     PCO2 (ARTERIAL)   Date Value Ref Range  Status   01/04/2019 48.0 (H) 35.0 - 45.0 mm/Hg Final     PCO2 (PCO2P)   Date Value Ref Range Status   01/04/2019 53 (HH) 35 - 45 mmHg Final     PO2 (ARTERIAL)   Date Value Ref Range Status   01/04/2019 97.0 72.0 - 100.0 mm/Hg Final     PO2 (PO2P)   Date Value Ref Range Status   01/04/2019 383 (H) 72 - 100 mmHg Final     SODIUM   Date Value Ref Range Status   01/04/2019 134 (L) 137 - 145 mmol/L Final     POTASSIUM   Date Value Ref Range Status   04/29/2020 4.2 3.5 - 5.1 mmol/L Final   01/18/2019 4.4  Final     KETONES   Date Value Ref Range Status   12/29/2018 Negative Negative mg/dL Final     POTASSIUM, POC   Date Value Ref Range Status   01/04/2019 3.8 3.5 - 5.0 mmol/L Final     WHOLE BLOOD POTASSIUM   Date Value Ref Range Status   01/04/2019 3.5 3.5 - 4.6 mmol/L Final     CHLORIDE   Date Value Ref Range Status   01/04/2019 109 101 - 111 mmol/L Final     CALCIUM   Date Value Ref Range Status   04/29/2020 9.6 8.8 - 10.2 mg/dL Final   01/18/2019 9.1  Final     Calculated P Axis   Date Value Ref Range Status   04/29/2020 57 degrees Final     Calculated R Axis   Date Value Ref Range Status   04/29/2020 61 degrees Final     Calculated T Axis   Date Value Ref Range Status   04/29/2020 75 degrees Final     IONIZED CALCIUM   Date Value Ref Range Status   01/06/2019 1.14 1.10 - 1.35 mmol/L Final     IONIZED CALCIUM, POC   Date Value Ref Range Status   01/04/2019 1.05 (L) 1.30 - 1.46 mmol/L Final     LACTATE   Date Value Ref Range Status   01/04/2019 1.1 0.0 - 1.3 mmol/L Final     HEMOGLOBIN   Date Value Ref Range Status      01/04/2019 12.1 12.0 - 18.0 g/dL Final     OXYHEMOGLOBIN   Date Value Ref Range Status   01/04/2019 95.5 85.0 - 98.0 % Final     CARBOXYHEMOGLOBIN   Date Value Ref Range Status   01/04/2019 1.5 0.0 - 2.5 % Final     MET-HEMOGLOBIN   Date Value Ref Range Status   01/04/2019 1.4 0.0 - 2.0 % Final     BASE EXCESS (BEP)   Date Value Ref Range Status   01/04/2019 3.0 -2.0 - 3.0 mEq/L Final     BASE DEFICIT   Date Value Ref Range Status   01/04/2019 1.1 0.0 - 3.0 mmol/L Final     BICARBONATE (ARTERIAL)   Date Value Ref Range Status   01/04/2019 24.0 18.0 - 26.0 mmol/L Final     HCO3 (HCO3P)   Date Value Ref Range Status   01/04/2019 29 (H) 22 - 26 mmol/L Final     Airway:* No LDAs found *  Blood pressure 123/77, pulse 63, temperature 36 C (96.8 F), resp. rate 17, height 1.651 m (5' 5" ), weight 102 kg (224 lb 3.3 oz), SpO2 99 %, not currently breastfeeding.

## 2020-05-14 NOTE — Anesthesia Postprocedure Evaluation (Signed)
Anesthesia Post Op Evaluation    [name]  Procedure(s):  COLONOSCOPY    Last Vitals:Temperature: 36 C (96.8 F) (05/14/20 1111)  Heart Rate: 69 (05/14/20 1111)  BP (Non-Invasive): 121/81 (05/14/20 1111)  Respiratory Rate: 17 (05/14/20 1111)  SpO2: 97 % (05/14/20 3685)    No complications documented.    Patient is sufficiently recovered from the effects of anesthesia to participate in the evaluation and has returned to their pre-procedure level.  Patient location during evaluation: PACU       Patient participation: complete - patient participated  Level of consciousness: awake and alert and responsive to verbal stimuli    Pain management: adequate  Airway patency: patent    Anesthetic complications: no  Cardiovascular status: acceptable  Respiratory status: acceptable  Hydration status: acceptable  Patient post-procedure temperature: Pt Normothermic   PONV Status: Absent

## 2020-05-14 NOTE — Nurses Notes (Signed)
Patient discharged home with family.  AVS reviewed with patient/care giver.  A written copy of the AVS and discharge instructions was given to the patient/care giver.  Questions sufficiently answered as needed.  Patient/care giver encouraged to follow up with PCP as indicated.  In the event of an emergency, patient/care giver instructed to call 911 or go to the nearest emergency room.   Pt to be discharged via wheelchair.

## 2020-05-14 NOTE — Discharge Instructions (Signed)
SURGICAL DISCHARGE INSTRUCTIONS     Dr. Kupec, Justin T., MD  performed your COLONOSCOPY today at the [facility]    [facility]:  Monday through Friday from 6 a.m. - 7 p.m.: [phone]  Between 7 p.m. - 6 a.m., weekends and holidays:  Call [facility] at [phone] or [phone].    PLEASE SEE WRITTEN HANDOUTS AS DISCUSSED BY YOUR NURSE    SIGNS AND SYMPTOMS OF A WOUND / INCISION INFECTION   Be sure to watch for the following:   Increase in redness or red streaks near or around the wound or incision.   Increase in pain that is intense or severe and cannot be relieved by the pain medication that your doctor has given you.   Increase in swelling that cannot be relieved by elevation of a body part, or by applying ice, if permitted.   Increase in drainage, or if yellow / green in color and smells bad. This could be on a dressing or a cast.   Increase in fever for longer than 24 hours, or an increase that is higher than 101 degrees Fahrenheit (normal body temperature is 98 degrees Fahrenheit). The incision may feel warm to the touch.    **CALL YOUR DOCTOR IF ONE OR MORE OF THESE SIGNS / SYMPTOMS SHOULD OCCUR.    ANESTHESIA INFORMATION   ANESTHESIA -- ADULT PATIENTS:  You have received intravenous sedation / general anesthesia, and you may feel drowsy and light-headed for several hours. You may even experience some forgetfulness of the procedure. DO NOT DRIVE A MOTOR VEHICLE or perform any activity requiring complete alertness or coordination until you feel fully awake in about 24-48 hours. Do not drink alcoholic beverages for at least 24 hours. Do not stay alone, you must have a responsible adult available to be with you. You may also experience a dry mouth or nausea for 24 hours. This is a normal side effect and will disappear as the effects of the medication wear off.    REMEMBER   If you experience any difficulty breathing, chest pain, bleeding that you feel is excessive,  persistent nausea or vomiting or for any other concerns:  Call your physician at [phone] or [phone]. You may also ask to have the doctor on call paged. They are available to you 24 hours a day.    SPECIAL INSTRUCTIONS / COMMENTS   Instructions for Lower Endoscopy  Patient Information:  No driving until tomorrow, You may experience a small amount of blood in your stool if a biopsy has been taken  and Call in one week for biopsy results - Referring MD   REMEMBER TO:  Call your physician for any of the following symptoms:  1.) Persistent vomiting or vomiting bright red blood  2.) Fever (temperature greater than 100F)  3.) Severe abdominal pain or rigid bloated abdomen.  4.) Large amounts of bright red blood or maroon stools.      FOLLOW-UP APPOINTMENTS   Please call patient services at [phone] or [phone] to schedule a date / time of return. They are open Monday - Friday from 7:30 am - 5:00 pm.

## 2020-05-15 ENCOUNTER — Ambulatory Visit (HOSPITAL_COMMUNITY): Payer: Self-pay

## 2020-05-15 ENCOUNTER — Encounter (HOSPITAL_COMMUNITY): Payer: Self-pay

## 2020-05-15 LAB — PT/INR: INR: 1.2

## 2020-05-15 NOTE — Progress Notes (Signed)
Anticoagulation Encounter Note:    Surgical AVR plan   Warfarin start date: 01-06-2019   [facility] Staff: Earlie Raveling   DX : On-X AVR   INR Range: 1.5 - 2.0  Tab strength: warfarin 2mg    Contact 1: [phone] cell   Contact 2:   Lab: POC/[place] - [facility]   Comments: Tylenol regularly    INR: 1.2  Current Dose: held for 5 days for colonoscopy; patient states she was allowed to restart her warfarin Tuesday and took 4 mg.   Clinical Outcomes     Positives:  INR Below Goal        Patient Findings     Positives:  Other complaints (had colonoscopy)        Plan: 6 mg Wednesday and Thursday; test INR on Friday. Confirmed dosing and testing with patient.  Next INR Date: 05/17/2020    Filbert Berthold, BSN       For additional information, see dosing calendar below.    Warfarin Therapy Instructions   July 2021 Details    Sun [name] Thu Fri Sat         1               2               3                 4               5               6               7               8               9               10                 11               12               13    1.8   4 mg   See details      14      4 mg         15      6 mg         16      4 mg         17      4 mg           18      4 mg         19      6 mg         20      4 mg         21      4 mg         22      Hold         23      Hold         24      Hold           25      Hold         26      Hold  27      4 mg         28   1.2   6 mg   See details      29      6 mg         30      4 mg         31                Date Details   07/13 Last INR check   INR: 1.8      07/28 This INR check   INR: 1.2       Date of next INR:  05/17/2020

## 2020-05-17 ENCOUNTER — Encounter (HOSPITAL_COMMUNITY): Payer: Self-pay

## 2020-05-17 ENCOUNTER — Ambulatory Visit (HOSPITAL_COMMUNITY): Payer: Self-pay

## 2020-05-17 LAB — PT/INR: INR: 1.2

## 2020-05-17 NOTE — Progress Notes (Signed)
Anticoagulation Encounter Note:    Surgical AVR plan   Warfarin start date: 01-06-2019   [facility] Staff: Earlie Raveling   DX : On-X AVR   INR Range: 1.5 - 2.0  Tab strength: warfarin 2mg    Contact 1: [phone] cell   Contact 2:   Lab: POC/[place] - [facility]   Comments: Tylenol regularly    INR: 1.2  Current Dose: 6 mg Wednesday and Thursday  Clinical Outcomes     Positives:  INR Below Goal          Plan: 6 mg Friday and Saturday; 4 mg Sunday; test INR on Monday. Confirmed dosing and testing with patient.  Next INR Date: 05/20/2020    Kate Samara Stankowski, BSN       For additional information, see dosing calendar below.    Warfarin Therapy Instructions   July 2021 Details    Sun Mon Tue Wed Thu Fri Sat         1               2               3                 4               5               6               7               8               9               10                 11               12               13               14               15               16               17                 18               19               20               21               22               23               24                 25               26               27               28   1.2   6 mg   See details      29   6 mg         30   1.2   6 mg   See details      31      6 mg          Date Details   07/28 Last INR check   INR: 1.2      07/30 This INR check   INR: 1.2              Warfarin Therapy Instructions   August 2021 Details    Sun Mon Tue Wed Thu Fri Sat     1      4 mg         2      6 mg         3               4               5               6               7                 8               9               10               11               12               13               14                 15               16               17               18               19               20               21                 22               23               24               25               26               27               28                 29                30               31                     Date Details   No additional details    Date of next INR:  05/20/2020

## 2020-05-20 ENCOUNTER — Encounter (HOSPITAL_COMMUNITY): Payer: Self-pay

## 2020-05-20 ENCOUNTER — Ambulatory Visit (HOSPITAL_COMMUNITY): Payer: Self-pay

## 2020-05-20 LAB — PT/INR: INR: 1.8

## 2020-05-20 NOTE — Progress Notes (Signed)
Anticoagulation Encounter Note:    Surgical AVR plan   Warfarin start date: 01-06-2019   [facility] Staff: Earlie Raveling   DX : On-X AVR   INR Range: 1.5 - 2.0  Tab strength: warfarin 2mg    Contact 1: [phone] cell   Contact 2:   Lab: P[facility]/[place] - [facility]   Comments: Tylenol regularly    INR: 1.8  Current Dose: 6 mg Friday and Saturday; 4 mg Sunday.  Clinical Outcomes     Positives:  INR In Range          Plan: 6 mg Monday and Thursday; 4 mg all other days. Test INR in 2 weeks. Confirmed dosing and testing with patient.  Next INR Date: 06/03/2020    Kate Orrie Lascano, BSN       For additional information, see dosing calendar below.    Warfarin Therapy Instructions   July 2021 Details    Sun Mon Tue Wed Thu Fri Sat         1               2               3                 4               5               6               7               8               9               10                 11               12               13               14               15               16               17                 18               19               20               21               22               23               24                 25               26               27               28               29   30   1.2   6 mg   See details      31      6 mg          Date Details   07/30 Last INR check   INR: 1.2              Warfarin Therapy Instructions   August 2021 Details    Sun Mon Tue Wed Thu Fri Sat     1      4 mg         2   1.8   6 mg   See details      3      4 mg         4      4 mg         5      6 mg         6      4 mg         7      4 mg           8      4 mg         9      6 mg         10      4 mg         11      4 mg         12      6 mg         13      4 mg         14      4 mg           15      4 mg         16      6 mg         17               18               19               20               21                 22               23               24               25               26               27               28                  29               30               31                     Date Details   08/02 This INR check   INR: 1.8       Date of next INR:  06/03/2020

## 2020-05-23 ENCOUNTER — Encounter (INDEPENDENT_AMBULATORY_CARE_PROVIDER_SITE_OTHER): Payer: Self-pay | Admitting: Cardiovascular Disease

## 2020-05-23 ENCOUNTER — Encounter (HOSPITAL_BASED_OUTPATIENT_CLINIC_OR_DEPARTMENT_OTHER): Payer: Self-pay | Admitting: Family

## 2020-05-23 ENCOUNTER — Encounter (HOSPITAL_COMMUNITY): Payer: Self-pay | Admitting: Hospitalist

## 2020-05-23 MED ORDER — METOPROLOL SUCCINATE ER 50 MG TABLET,EXTENDED RELEASE 24 HR
50.00 mg | ORAL_TABLET | Freq: Every day | ORAL | 4 refills | Status: DC
Start: 2020-05-23 — End: 2021-07-14

## 2020-05-24 ENCOUNTER — Other Ambulatory Visit (HOSPITAL_COMMUNITY): Payer: Self-pay | Admitting: Cardiovascular Disease

## 2020-05-24 ENCOUNTER — Other Ambulatory Visit (HOSPITAL_BASED_OUTPATIENT_CLINIC_OR_DEPARTMENT_OTHER): Payer: Self-pay | Admitting: Family

## 2020-05-27 ENCOUNTER — Encounter (INDEPENDENT_AMBULATORY_CARE_PROVIDER_SITE_OTHER): Payer: Self-pay | Admitting: Cardiovascular Disease

## 2020-05-27 ENCOUNTER — Encounter (HOSPITAL_BASED_OUTPATIENT_CLINIC_OR_DEPARTMENT_OTHER): Payer: Self-pay | Admitting: Family

## 2020-05-27 DIAGNOSIS — I10 Essential (primary) hypertension: Secondary | ICD-10-CM

## 2020-05-28 MED ORDER — WARFARIN 2 MG TABLET
6.00 mg | ORAL_TABLET | Freq: Every evening | ORAL | 3 refills | Status: DC
Start: 2020-05-28 — End: 2021-01-06

## 2020-05-28 MED ORDER — HYDROCHLOROTHIAZIDE 25 MG TABLET
25.00 mg | ORAL_TABLET | Freq: Every day | ORAL | 4 refills | Status: DC
Start: 2020-05-28 — End: 2021-07-15

## 2020-05-28 NOTE — Telephone Encounter (Addendum)
[facility] Medication pended. Reather Converse, RN  05/28/2020, 15:34  From: [name]  To: Derek Jack, APRN,FNP-BC  Sent: 05/27/2020  8:25 PM EDT  Subject: Prescription     [facility] has contacted you for prescription info for HCTZ for home delivery. According to them, they are waiting on your response to fill the order. Could you please okay this? Thank you.

## 2020-05-31 ENCOUNTER — Encounter (HOSPITAL_BASED_OUTPATIENT_CLINIC_OR_DEPARTMENT_OTHER): Payer: Self-pay

## 2020-06-03 ENCOUNTER — Encounter (HOSPITAL_COMMUNITY): Payer: Self-pay

## 2020-06-03 ENCOUNTER — Ambulatory Visit (HOSPITAL_COMMUNITY): Payer: Self-pay

## 2020-06-03 LAB — PT/INR: INR: 2.3

## 2020-06-03 NOTE — Progress Notes (Signed)
Anticoagulation Encounter Note:    Surgical AVR plan   Warfarin start date: 01-06-2019   [facility] Staff: Earlie Raveling   DX : On-X AVR   INR Range: 1.5 - 2.0  Tab strength: warfarin 2mg    Contact 1: [phone] cell   Contact 2:   Lab: [facility]/[place] - [facility]   Comments: Tylenol regularly    INR: 2.3  Current Dose: 6 mg Monday and Thursday; 4 mg all other days.   Clinical Outcomes     Positives:  INR Above Goal (slightly)          Plan: last night patient states she had a headache, and did take tylenol but states she mostly does at night anyway. 4 mg Monday 08/16; 6 mg Thursday and Monday; 4 mg all other days. Test INR in 2 weeks. Confirmed dosing and testing with patient.  Next INR Date: 06/17/2020    Filbert Berthold, BSN       For additional information, see dosing calendar below.    Warfarin Therapy Instructions   August 2021 Details    Sun [name] Thu Fri Sat     1               2   1.8   6 mg   See details      3      4 mg         4      4 mg         5      6 mg         6      4 mg         7      4 mg           8      4 mg         9      6 mg         10      4 mg         11      4 mg         12      6 mg         13      4 mg         14      4 mg           15      4 mg         16   2.3   4 mg   See details      17      4 mg         18      4 mg         19      6 mg         20      4 mg         21      4 mg           22      4 mg         23      6 mg         24      4 mg         25      4 mg  26      6 mg         27      4 mg         28      4 mg           29      4 mg         30      6 mg         31                    Date Details   08/02 Last INR check   INR: 1.8      08/16 This INR check   INR: 2.3       Date of next INR:  06/17/2020

## 2020-06-17 ENCOUNTER — Ambulatory Visit (HOSPITAL_COMMUNITY): Payer: Self-pay

## 2020-06-17 ENCOUNTER — Encounter (HOSPITAL_COMMUNITY): Payer: Self-pay

## 2020-06-17 LAB — PT/INR: INR: 2.1

## 2020-06-17 NOTE — Progress Notes (Signed)
Anticoagulation Encounter Note:    Surgical AVR plan   Warfarin start date: 01-06-2019   [facility] Staff: Earlie Raveling   DX : On-X AVR   INR Range: 1.5 - 2.0  Tab strength: warfarin 2mg    Contact 1: [phone] cell   Contact 2:   Lab: [facility]/[place] - [facility]   Comments: Tylenol regularly    INR: 2.1  Current Dose: 4 mg once, Monday 08/16; 6 mg Thursday and Monday; 4 mg all other days.   Clinical Outcomes       Positives:  INR In Range             Plan: 6 mg Monday and Thursday; 4 mg all other days. Test INR in 2 weeks. Confirmed dosing and testing with patient.  Next INR Date: 07/01/2020    Filbert Berthold, BSN       For additional information, see dosing calendar below.    Warfarin Therapy Instructions   August 2021 Details    Sun [name] Thu Fri Sat     1               2               3               4               5               6               7                 8               9               10               11               12               13               14                 15               16    2.3   4 mg   See details      17      4 mg         18      4 mg         19      6 mg         20      4 mg         21      4 mg           22      4 mg         23      6 mg         24      4 mg         25      4 mg         26      6 mg         27      4 mg  28      4 mg           29      4 mg         30   2.1   6 mg   See details      31      4 mg              Date Details   08/16 Last INR check     INR: 2.3      08/30 This INR check     INR: 2.1              Warfarin Therapy Instructions   September 2021 Details    Sun Mon Tue Wed Thu Fri Sat        1      4 mg         2      6 mg         3      4 mg         4      4 mg           5      4 mg         6      6 mg         7      4 mg         8      4 mg         9      6 mg         10      4 mg         11      4 mg           12      4 mg         13      6 mg         14               15               16               17               18                 19               20                21               22               23               24               25                 26               27               28               29                30  Date Details   No additional details    Date of next INR:  07/01/2020

## 2020-06-19 ENCOUNTER — Encounter (INDEPENDENT_AMBULATORY_CARE_PROVIDER_SITE_OTHER): Payer: Self-pay | Admitting: Cardiovascular Disease

## 2020-06-20 ENCOUNTER — Encounter (HOSPITAL_BASED_OUTPATIENT_CLINIC_OR_DEPARTMENT_OTHER): Payer: Self-pay | Admitting: Family

## 2020-07-01 ENCOUNTER — Encounter (HOSPITAL_COMMUNITY): Payer: Self-pay

## 2020-07-01 ENCOUNTER — Ambulatory Visit (HOSPITAL_COMMUNITY): Payer: Self-pay

## 2020-07-01 LAB — PT/INR: INR: 2

## 2020-07-01 NOTE — Progress Notes (Signed)
Anticoagulation Encounter Note:    Surgical AVR plan   Warfarin start date: 01-06-2019   [facility] Staff: Earlie Raveling   DX : On-X AVR   INR Range: 1.5 - 2.0  Tab strength: warfarin 2mg    Contact 1: [phone] cell   Contact 2:   Lab: [facility]/[place] - [facility]   Comments: Tylenol regularly    INR: 2.0  Current Dose: 6 mg every Monday and Thursday, 4 mg all other days.  Clinical Outcomes     Positives:  INR In Range          Plan: 6 mg every Monday and Thursday, 4 mg all other days; recheck in 2 weeks.   Next INR Date: 9.27.21      For additional information, see dosing calendar below.    Warfarin Therapy Instructions   August 2021 Details    Sun [name] Thu Fri Sat     1               2               3               4               5               6               7                 8               9               10               11               12               13               14                 15               16               17               18               19               20               21                 22               23               24               25               26               27               28                  29  30   2.1   6 mg   See details      31      4 mg              Date Details   08/30 Last INR check   INR: 2.1              Warfarin Therapy Instructions   September 2021 Details    Sun Mon Tue Wed Thu Fri Sat        1      4 mg         2      6 mg         3      4 mg         4      4 mg           5      4 mg         6      6 mg         7      4 mg         8      4 mg         9      6 mg         10      4 mg         11      4 mg           12      4 mg         13   2.0   6 mg   See details      14      4 mg         15      4 mg         16      6 mg         17      4 mg         18      4 mg           19      4 mg         20      6 mg         21      4 mg         22      4 mg         23      6 mg         24      4 mg         25      4 mg           26      4 mg         27      6 mg          28               29               30                   Date Details   09/13 This INR check   INR: 2.0       Date of next INR:  07/15/2020

## 2020-07-08 ENCOUNTER — Encounter (HOSPITAL_BASED_OUTPATIENT_CLINIC_OR_DEPARTMENT_OTHER): Payer: Self-pay | Admitting: GENERAL PRACTICE

## 2020-07-12 ENCOUNTER — Encounter (HOSPITAL_BASED_OUTPATIENT_CLINIC_OR_DEPARTMENT_OTHER): Payer: Self-pay

## 2020-07-12 ENCOUNTER — Other Ambulatory Visit: Payer: Self-pay

## 2020-07-12 ENCOUNTER — Ambulatory Visit
Admission: RE | Admit: 2020-07-12 | Discharge: 2020-07-12 | Disposition: A | Discharge status: Home / Self Care | Payer: Medicare Other | Source: Ambulatory Visit | Attending: Family | Admitting: Family

## 2020-07-12 DIAGNOSIS — Z1231 Encounter for screening mammogram for malignant neoplasm of breast: Secondary | ICD-10-CM | POA: Insufficient documentation

## 2020-07-15 ENCOUNTER — Ambulatory Visit (HOSPITAL_COMMUNITY): Payer: Self-pay

## 2020-07-15 ENCOUNTER — Other Ambulatory Visit: Payer: Self-pay

## 2020-07-15 ENCOUNTER — Encounter (HOSPITAL_COMMUNITY): Payer: Self-pay

## 2020-07-15 ENCOUNTER — Ambulatory Visit: Payer: Medicare Other | Attending: DERMATOLOGY | Admitting: GENERAL PRACTICE

## 2020-07-15 ENCOUNTER — Encounter (HOSPITAL_BASED_OUTPATIENT_CLINIC_OR_DEPARTMENT_OTHER): Payer: Self-pay | Admitting: GENERAL PRACTICE

## 2020-07-15 VITALS — BP 141/84 | HR 77 | Temp 97.0°F | Wt 224.2 lb

## 2020-07-15 DIAGNOSIS — L409 Psoriasis, unspecified: Secondary | ICD-10-CM | POA: Insufficient documentation

## 2020-07-15 DIAGNOSIS — Z85828 Personal history of other malignant neoplasm of skin: Secondary | ICD-10-CM

## 2020-07-15 DIAGNOSIS — L570 Actinic keratosis: Secondary | ICD-10-CM | POA: Insufficient documentation

## 2020-07-15 LAB — PT/INR: INR: 2.3

## 2020-07-15 NOTE — Procedures (Signed)
See visit note   See resident's note for details. I saw and examined the patient and agree with the resident's findings and plan as written except as noted, and I was present and supervised/observed all procedures in their entirety.    Mauri Reading, MD 07/15/2020, 12:58

## 2020-07-15 NOTE — Progress Notes (Signed)
Dermatology, [facility]  [place] [id number]  [phone]    Date:   07/15/2020  Name: [name]  Age: 64 y.o.    Chief complaint: Skin Check    HPI  [name] is a 64 y.o. female with a history of BCC on her left upper lip s/p MOHs on 03/08/2019 presenting for a skin check. Patient has a history of scalp psoriasis. States she has been using Clobetasol solution and derma-smoothe oil with minimal improvement. Itching in gluteal cleft though to be inverse psoriasis clear wit TAC. She also as some peeling on the face form Otherwise denies any new, changing, bleeding, or rapidly growing lesions.  No other skin-related complaints. Her sister has a history of a NMSC.       Review of Systems   Constitutional: Negative for chills and fever.   Skin: Positive for itching and rash.     Current Medications   acetaminophen (TYLENOL) 500 mg Oral Tablet Take 650 mg by mouth Every night     aspirin 81 mg Oral Tablet, Chewable Take 1 Tab (81 mg total) by mouth Once a day    Cholecalciferol, Vitamin D3, 50 mcg (2,000 unit) Oral Capsule Take 2,000 Units by mouth Once a day     clobetasoL (CORMAX) 0.05 % Solution APPLY SOLUTION TOPICALLY TO AFFECTED AREA TWICE DAILY    clobetasoL (TEMOVATE) 0.05 % Cream by Apply Topically route Twice daily    Fluocinolone-Shower Cap 0.01 % Oil Apply to scalp 1-2 times weekly at night.    fluticasone propionate (FLONASE) 50 mcg/actuation Nasal Spray, Suspension 1 Spray by Each Nostril route Once a day    hydroCHLOROthiazide (HYDRODIURIL) 25 mg Oral Tablet Take 1 Tablet (25 mg total) by mouth Once a day    loratadine (CLARITIN) 10 mg Oral Tablet Take 10 mg by mouth Once a day    metoprolol succinate (TOPROL-XL) 50 mg Oral Tablet Sustained Release 24 hr Take 1 Tablet (50 mg total) by mouth Once a day    triamcinolone acetonide (ARISTOCORT A) 0.1 % Cream Apply topically Twice daily    warfarin (COUMADIN) 2 mg Oral Tablet Take 3 Tablets (6  mg total) by mouth Every evening     Allergies   Allergen Reactions    Iv Contrast Anaphylaxis    Oregano      Hives      Stadol [Butorphanol Tartrate] Mental Status Effect    Tomato Hives/ Urticaria     Past Medical History:   Diagnosis Date    Abdominal hernia 04/29/2020    hx of 3 repairs    Anemia     Aortic stenosis     Arthritis     Atrial fibrillation (CMS HCC) 04/29/2020    one episode  after AVR    Back problem 04/29/2020    LBP    Beta blocker prescribed for left ventricular systolic dysfunction     Blood in stool     positive cologuard.  Has not yet scheduled colonoscopy    Blood thinned due to long-term anticoagulant use 04/29/2020    coumadin    Cancer (CMS HCC)     skin cancer L lip.  Needs MOHS to be scheduled    Chronic pain 04/29/2020    back and right hip    Colon polyp     Heart murmur 01/04/2019    AVR    High blood pressure     Motion sickness  Obesity     Rash 04/29/2020    scalp psoriasis and involves R ear    Valvular disease     aortic valve stenosis, following with CT surgery, echo 02/2016 and cath 03/2016    Wears glasses          Physical Exam  Vitals: Blood pressure (!) 141/84, pulse 77, temperature 36.1 C (97 F), temperature source Thermal Scan, weight 102 kg (224 lb 3.3 oz), not currently breastfeeding.  Physical Exam  Constitutional:       General: She is not in acute distress.     Appearance: Normal appearance.   HENT:      Head:     Skin:     General: Skin is warm and dry.      Coloration: Skin is not pale.          Neurological:      Mental Status: She is alert and oriented to person, place, and time. Mental status is at baseline.      Coordination: Coordination normal.   Psychiatric:         Mood and Affect: Mood normal.     General skin exam was performed including head, neck, anterior/posterior trunk, bilateral upper & lower extremities and revealed no areas of concern other than those documented.    Assessment and Plan  Problem List Items Addressed This Visit       None           Visit Diagnoses       Scalp psoriasis    -  Primary    Actinic keratoses        Relevant Orders    DESTRUCTION OF PREMALIGNANT LESIONS-ORDERABLE (AMB ONLY)          #History of BCC s/p MOHs on 03/08/2019 (see #1 on face diagram)  - No reoccurrence noted  -Will follow     #Scalp Psoriasis (See #2 on head diagram)  - We discussed the diagnosis, etiology, natural course and management of this condition. Declines systemic therapy at this time. Consider Otezla in the future  - Continue clobetasol solution BID prn   - Continue Fluocinolone 0.01% oil 1-2 times weekly.  Reviewed risk of skin atrophy with prolonged use.  - Start OTC T-sal (SA shampoo)    #AKs(#3, head and #2 body)  Cryotherapy performed x3 lesions, 2 freeze-thaw cycles.  Expected side effects were discussed including pain, edema, vesicles, and pigment changes, especially hypopigmentation.  The patient expressed understanding of expected side effects.     RTC in 3 months or sooner if needed    Nonnie Done, MD 07/15/2020 12:50  [facility]  Department of Dermatology  PGY-3 Resident     See resident's note for details. I saw and examined the patient and agree with the resident's findings and plan as written except as noted, and I was present and supervised/observed all procedures in their entirety.    Mauri Reading, MD 07/15/2020, 12:58

## 2020-07-15 NOTE — Progress Notes (Signed)
Anticoagulation Encounter Note:    Surgical AVR plan   Warfarin start date: 01-06-2019   [facility] Staff: Earlie Raveling   DX : On-X AVR   INR Range: 1.5 - 2.0  Tab strength: warfarin 2mg    Contact 1: [phone] cell   Contact 2:   Lab: POC/[place] - [facility]   Comments: Tylenol regularly    INR: 2.3    Current Dose: 6 mg M,Th/ 4 mg all other days    Clinical Outcomes       Positives:  INR In Range (INR good. pt called. )             Plan:  pt to continue current dosing and recheck INR in two weeks. Dosing verified by pt.     Next INR Date: 07/29/20    Leone Brand, BSN RN  Specialty Care Nurse, Anticoagulation  [facility]  Ph. [phone]  (ext. [phone])  Fax [phone]  [email or web address]        For additional information, see dosing calendar below.    Warfarin Therapy Instructions   September 2021 Details    Sun Molli Knock Tue Wed Thu Fri Sat        1               2               3               4                 5               6               7               8               9               10               11                 12               13    2.0   6 mg   See details      14      4 mg         15      4 mg         16      6 mg         17      4 mg         18      4 mg           19      4 mg         20      6 mg         21      4 mg         22      4 mg         23      6 mg         24      4 mg         25      4 mg  26      4 mg         27   2.3   6 mg   See details      28      4 mg         29      4 mg         30      6 mg            Date Details   09/13 Last INR check     INR: 2.0      09/27 This INR check     INR: 2.3              Warfarin Therapy Instructions   October 2021 Details    Sun Mon Tue Wed Thu Fri Sat          1      4 mg         2      4 mg           3      4 mg         4      6 mg         5      4 mg         6      4 mg         7      6 mg         8      4 mg         9      4 mg           10      4 mg         11      6 mg         12               13               14                15               16                 17               18               19               20               21               22               23                 24               25               26               27               28                29  30                 31                      Date Details   No additional details    Date of next INR:  07/29/2020

## 2020-07-18 ENCOUNTER — Ambulatory Visit: Payer: Medicare Other | Attending: Family Medicine | Admitting: Family Medicine

## 2020-07-18 ENCOUNTER — Encounter (HOSPITAL_BASED_OUTPATIENT_CLINIC_OR_DEPARTMENT_OTHER): Payer: Self-pay | Admitting: Family Medicine

## 2020-07-18 ENCOUNTER — Telehealth (HOSPITAL_BASED_OUTPATIENT_CLINIC_OR_DEPARTMENT_OTHER): Payer: Self-pay | Admitting: Family

## 2020-07-18 ENCOUNTER — Ambulatory Visit: Payer: Medicare Other | Attending: Family Medicine

## 2020-07-18 ENCOUNTER — Encounter (HOSPITAL_BASED_OUTPATIENT_CLINIC_OR_DEPARTMENT_OTHER): Payer: Self-pay | Admitting: Family

## 2020-07-18 DIAGNOSIS — M2550 Pain in unspecified joint: Secondary | ICD-10-CM

## 2020-07-18 DIAGNOSIS — Z20822 Contact with and (suspected) exposure to covid-19: Secondary | ICD-10-CM | POA: Insufficient documentation

## 2020-07-18 DIAGNOSIS — R5381 Other malaise: Secondary | ICD-10-CM

## 2020-07-18 DIAGNOSIS — Z20828 Contact with and (suspected) exposure to other viral communicable diseases: Secondary | ICD-10-CM | POA: Insufficient documentation

## 2020-07-18 LAB — COVID-19 ~~LOC~~ MOLECULAR LAB TESTING: SARS-CoV-2: NOT DETECTED

## 2020-07-18 NOTE — Progress Notes (Signed)
FAMILY MEDICINE, [facility]  [place] [id number]  Operated by [employer]  Video Visit     Name: [name]  MRN: [id number]    Date: 07/18/2020  Age: 64 y.o.                            Patient's location: [place] [address]   Patient/family aware of provider location: Yes  Patient/family consent for video visit: Yes  Interview and observation performed by: Katrina Stack, MD    Chief Complaint: COVID-19 Screening    History of Present Illness:  [name] is a 64 y.o. female here with:   Constellation of sx including headache, malaise, rhinorrhea, cough, diarrhea, and arthralgias that are new within last few d; traveled to [place] last wk; grand-daughter is newly ill with vomiting as well; 01/03/20 was 2nd COVID vaccine dose per pt; denies current fevers/chills, anosmia, ageusia, dyspnea, chest pain; normal PO intake    Past Medical History:  She has a past medical history of Abdominal hernia (04/29/2020), Anemia, Aortic stenosis, Arthritis, Atrial fibrillation (CMS HCC) (04/29/2020), Back problem (04/29/2020), Beta blocker prescribed for left ventricular systolic dysfunction, Blood in stool, Blood thinned due to long-term anticoagulant use (04/29/2020), Cancer (CMS [place]), Chronic pain (04/29/2020), Colon polyp, Heart murmur (01/04/2019), High blood pressure, Motion sickness, Obesity, Rash (04/29/2020), Valvular disease, and Wears glasses. She also has no past medical history of Angina pectoris (CMS HCC), Anxiety, Asthma, Awareness under anesthesia, Breast CA (CMS [place]), Cataract, Cervical cancer (CMS [place]), Chronic obstructive airway disease (CMS HCC), Claustrophobia, Clotting disorder (CMS HCC), Colon cancer (CMS [place]), Congenital anomaly of heart, Congestive heart failure (CMS HCC), Convulsions (CMS HCC), Coronary artery disease, Cough, CVA (cerebrovascular accident) (CMS HCC), Deep vein thrombosis (DVT) (CMS HCC), Depression, Difficult intubation,  Difficulty waking, Disorder of liver, Dyspnea on exertion, Endometrial cancer (CMS HCC), Esophageal reflux, Glaucoma, H/O hearing loss, H/O urinary tract infection, Hard to intubate, Headache, Heartburn, Hepatitis A virus infection, HH (hiatus hernia), History of kidney disease, coronary artery bypass graft, transfusion, Hyperlipidemia, Hyperthyroidism, Hypothyroid, Jaundice, Kidney stones, Malignant hyperthermia, MI (myocardial infarction) (CMS HCC), Migraine, Neck problem, Orthopnea, Ovarian cancer (CMS HCC), Palpitations, Panic attack, Peripheral edema, Personal history of venous thrombosis and embolism, Pneumonia, PONV (postoperative nausea and vomiting), Problems with swallowing, Pseudocholinesterase deficiency, Pulmonary embolism (CMS HCC), Rheumatic fever, Sleep apnea, Speech and language deficits, Thyroid disorder, TIA (transient ischemic attack), Treatment, Tuberculosis colliquativa, Type 2 diabetes mellitus (CMS [place]), Type I diabetes mellitus (CMS [place]), Unintentional weight loss, Upper respiratory infection, Uterine cancer (CMS [place]), Viral hepatitis B, or Viral hepatitis C.    Past Surgical History:  She has a past surgical history that includes hx hysterectomy (2003); hx cesarean section (1984); hx hernia repair (2004, 2006, 2008); hx hip replacement (Left, 08/11/2016); hx heart catheterization; hx oophorectomy; and hx aortic valve replacement.    Problem List:  She has Essential hypertension; S/P hip replacement; Low back pain radiating to left leg; Severe aortic stenosis; and S/P AVR (aortic valve replacement) on their problem list.    Medications:  .  acetaminophen  .  aspirin  .  Cholecalciferol (Vitamin D3)  .  clobetasoL  .  clobetasoL  .  Fluocinolone-Shower Cap  .  fluticasone propionate  .  hydroCHLOROthiazide  .  loratadine  .  metoprolol succinate  .  triamcinolone acetonide  .  warfarin     Review of Systems:   Positive  ROS discussed in HPI, otherwise all other systems  negative.    Observational Exam:   Physical Exam  Constitutional:       General: She is not in acute distress.     Appearance: She is not ill-appearing, toxic-appearing or diaphoretic.   HENT:      Head: Normocephalic and atraumatic.   Eyes:      General: No scleral icterus.     Conjunctiva/sclera: Conjunctivae normal.   Pulmonary:      Effort: Pulmonary effort is normal. No respiratory distress.   Neurological:      Mental Status: She is alert.   Psychiatric:         Mood and Affect: Mood normal.         Behavior: Behavior normal.         Thought Content: Thought content normal.         Judgment: Judgment normal.       Data Reviewed:   Last CBC  (Last result in the past 2 years)      WBC   HGB   HCT   MCV   Platelets        04/29/20 1154 6.0 14.3 44.0 90.2 263         Last BMP  (Last result in the past 2 years)      Na   K   Cl   CO2   BUN   Cr   Calcium   Glucose   Glucose-Fasting        04/29/20 1154 133 4.2 97 29 17 0.76 9.6 92         Last Hepatic Panel  (Last result in the past 2 years)      Albumin   Total PTN   Total Bili   Direct Bili   Ast/SGOT   Alt/SGPT   Alk Phos        01/08/19 0418 3.2 6.0 0.5 0.3 15 9  47     01/08/19 0418           7       01/08/19 0418         15             CT lung cancer screening 02/14/20 with moderate emphysematous changes    Assessment/Plan:  1. Malaise  DDx includes COVID19, influenza, other viral URI  - COVID-19 SCREENING OUTPATIENT AND DRIVE UP TESTING; Future  - If COVID neg, likely self-limiting, czd by variable degrees of sneezing, nasal congestion/discharge, sore throat, cough, low grade fever, headache, and malaise  - indications for re-evaluation: if sx worsen (difficulty breathing/swallowing, high fever), persistent sx >14d  - maintain hydration to thin secretions and soothe respiratory mucosa  - topical saline to nasal cavity to improve mucociliary clearance and lead to vasoconstriction (decongestion)  - cool mist humidifier/vaporizer to add moisture to air to loosen  nasal secretions  - OTC Rx for sx relief: antihistamines, decongestants, antitussives, expectorants, mucolytics, antipyretics/analgesics     Follow Up:  if sx worsen or fail to improve    Katrina Stack, MD

## 2020-07-18 NOTE — Telephone Encounter (Addendum)
PT referral pended. Please advise. Thanks,  Beryl Meager, RN  07/18/2020, 16:35    ----- Message from [name] sent at 07/18/2020  3:10 PM EDT -----  Regarding: Prescription for PT  This morning my pain seemed to be in all my big joints; shoulders, hips, knees, and ankles. My granddaughter had to be picked up from school this morning because she was throwing up. (We live in same household.) Then I realized that I had diarrhea yesterday as well as aches and pains. Both of [place] were tested for Covid this morning and awaiting results.     However before this all over pain, my main source of pain has been right shoulder and right hip. I will be using [facility] in [place]. Fax # [phone].  Would you also please send me a copy or make available in MyChart, the orders for physical therapy?    Thank you.

## 2020-07-18 NOTE — Telephone Encounter (Signed)
-----   Message from [name] sent at 07/18/2020  7:21 AM EDT -----  Regarding: Prescription for PT  [facility] Raquel Sarna,    Last time I saw you, you offered to write orders for physical therapy. Due to increases in my pain overall, I believe I need to start PT very soon. My question is can I go to physical therapy anywhere on your orders? If so, would you please write me a prescription for physical therapy? Thank you.

## 2020-07-19 ENCOUNTER — Ambulatory Visit (VACCINATION_CLINIC): Payer: Medicare Other

## 2020-07-19 ENCOUNTER — Encounter (VACCINATION_CLINIC): Payer: Self-pay

## 2020-07-21 ENCOUNTER — Other Ambulatory Visit: Payer: Self-pay

## 2020-07-26 NOTE — Telephone Encounter (Addendum)
Patient called in regarding this issue. Please see documentation in separate encounter as referral has been faxed along with the face sheet.  Wesley Desanctis, RN  07/26/2020, 08:47      Regarding: [facility]/ orders faxed  ----- Message from Jules Husbands sent at 07/26/2020  8:36 AM EDT -----  Derek Jack, APRN,FNP-BC    Amy from [facility] script fax over for pt that has appt this morning.  Please fax [phone]    Thanks  Jules Husbands

## 2020-07-26 NOTE — Telephone Encounter (Signed)
Patient called in as she is scheduled for PT this morning and [facility] PT has not received the referral. Referral was signed by Lillie Columbia on 07/22/2020 but no documentation of it being faxed. Advised patient I would fax the referral now. Patient states understanding. PT referral and face sheet faxed to [place] [phone] with confirmation.  Wesley Desanctis, RN  07/26/2020, 08:43

## 2020-07-29 ENCOUNTER — Ambulatory Visit (HOSPITAL_COMMUNITY): Payer: Self-pay

## 2020-07-29 ENCOUNTER — Encounter (HOSPITAL_COMMUNITY): Payer: Self-pay

## 2020-07-29 LAB — PT/INR: INR: 2.1

## 2020-07-29 NOTE — Progress Notes (Signed)
Anticoagulation Encounter Note:    Surgical AVR plan   Warfarin start date: 01-06-2019   [facility]VI Staff: Earlie Raveling   DX : On-X AVR   INR Range: 1.5 - 2.0  Tab strength: warfarin 2mg    Contact 1: [phone] cell   Contact 2:   Lab: POC/[place] - [facility]   Comments: Tylenol regularly    INR: 2.1  Current Dose: 6 mg (2 mg x 3) every Mon, Thu; 4 mg (2 mg x 2) all other days  Clinical Outcomes     Positives:  INR In Range        Patient Findings     Positives:  Change in health (diarrhea about a 1.5 weeks ago)    Negatives:  Change in medications, Change in diet/appetite        Plan: 6 mg (2 mg x 3) every Thu; 4 mg (2 mg x 2) all other days; recheck in 2 weeks. Confirmed dosing and testing with patient.     Next INR Date: 10.25.21    Erie Noe, RN SCN  [facility]        For additional information, see dosing calendar below.    Warfarin Therapy Instructions   September 2021 Details    Sun [name] Thu Fri Sat        1               2               3               4                 5               6               7               8               9               10               11                 12               13               14               15               16               17               18                 19               20               21               22               23               24                25  26               27   2.3   6 mg   See details      28      4 mg         29      4 mg         30      6 mg            Date Details   09/27 Last INR check   INR: 2.3              Warfarin Therapy Instructions   October 2021 Details    Sun Mon Tue Wed Thu Fri Sat          1      4 mg         2      4 mg           3      4 mg         4      6 mg         5      4 mg         6      4 mg         7      6 mg         8      4 mg         9      4 mg           10      4 mg         11   2.1   4 mg   See details      12      4 mg         13      4 mg         14      6 mg         15      4 mg         16       4 mg           17      4 mg         18      4 mg         19      4 mg         20      4 mg         21      6 mg         22      4 mg         23      4 mg           24      4 mg         25      6 mg         26               27               28                29  30                 31                      Date Details   10/11 This INR check   INR: 2.1       Date of next INR:  08/12/2020

## 2020-08-06 NOTE — Telephone Encounter (Addendum)
PT referral and face sheet re-faxed to [facility] PT at fax [phone] with confirmation. Beryl Meager, RN  08/06/2020, 12:40    Regarding: Britta Mccreedy, pt fax  ----- Message from Era Bumpers sent at 08/06/2020 11:20 AM EDT -----  Derek Jack, APRN,FNP-BC      [facility] is calling and they did not receive the updated physical therapy form can you  Please re-fax to them  [facility] [phone].    Thank You    Melissa Canary Brim

## 2020-08-08 ENCOUNTER — Encounter (HOSPITAL_BASED_OUTPATIENT_CLINIC_OR_DEPARTMENT_OTHER): Payer: Self-pay | Admitting: GENERAL PRACTICE

## 2020-08-08 ENCOUNTER — Other Ambulatory Visit (HOSPITAL_COMMUNITY): Payer: Self-pay | Admitting: Family

## 2020-08-12 ENCOUNTER — Ambulatory Visit (HOSPITAL_COMMUNITY): Payer: Self-pay

## 2020-08-12 ENCOUNTER — Encounter (HOSPITAL_COMMUNITY): Payer: Self-pay

## 2020-08-12 ENCOUNTER — Other Ambulatory Visit (HOSPITAL_BASED_OUTPATIENT_CLINIC_OR_DEPARTMENT_OTHER): Payer: Self-pay | Admitting: GENERAL PRACTICE

## 2020-08-12 LAB — PT/INR: INR: 1.9

## 2020-08-12 MED ORDER — NYSTATIN 100,000 UNIT/GRAM TOPICAL POWDER
Freq: Two times a day (BID) | CUTANEOUS | 0 refills | Status: DC | PRN
Start: 2020-08-12 — End: 2022-06-18

## 2020-08-12 NOTE — Progress Notes (Signed)
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 was easily stopped. reeducated pt on ways to keep nasal cavities moist and cared for during the dry weather of the winter. ), INR In Range (INR good. pt called. )          Patient Findings       Positives:  Missed doses (pt states that she only took 4 mg last thursday evening instead of the 6 mg scheduled due to nosebleed. )          Plan:  pt to continue current dosing and recheck INR in two weeks. Dosing verified by pt.     Next INR Date: 08/26/20    Leone Brand, BSN RN  Specialty Care Nurse, Anticoagulation  [facility]  Ph. [phone]  (ext. [phone])  Fax [phone]  [email or web address]        For additional information, see dosing calendar below.    Warfarin Therapy Instructions   October 2021 Details    Sun Molli Knock Tue Wed Thu Fri Sat          1               2                 3               4               5               6               7               8               9                 10               11      4  mg   See details      12      4 mg         13      4 mg         14      6 mg         15      4 mg         16      4 mg           17      4 mg         18      4 mg         19      4 mg         20      4 mg         21  4 mg         22      4 mg         23      4 mg           24      4 mg         25   1.9   4 mg   See details      26      4 mg         27      4 mg         28      6 mg         29      4 mg         30      4 mg           31      4 mg                Date Details   10/11 Last INR check      10/25 This INR check     INR: 1.9               Warfarin Therapy Instructions   November 2021 Details    Sun Mon Tue Wed Thu Fri Sat      1      4 mg         2      4 mg         3      4 mg         4      6 mg         5      4 mg         6      4 mg           7      4 mg         8      6 mg         9               10               11               12               13                 14               15               16               17               18               19               20                 21               22                23  24               25               26               27                 28               29               30                     Date Details   No additional details    Date of next INR:  08/26/2020

## 2020-08-13 ENCOUNTER — Ambulatory Visit (HOSPITAL_BASED_OUTPATIENT_CLINIC_OR_DEPARTMENT_OTHER): Payer: Self-pay | Admitting: Family

## 2020-08-13 DIAGNOSIS — M25551 Pain in right hip: Secondary | ICD-10-CM

## 2020-08-13 NOTE — Telephone Encounter (Signed)
Hip xray ordered.   Thanks  Derek Jack, APRN,FNP-BC

## 2020-08-13 NOTE — Telephone Encounter (Addendum)
Called [facility] back in spoke with Colletta Maryland the physical therapist.  She states that she has seen the patient for for 5 visits for right hip pain and limited range of motion of the right lower extremity.  She states the patient has not had any hip x-rays since 2019, which I can see on her chart.  She states that she thinks the patient would benefit from some hip x-rays to see if there is any new bony abnormalities or a really severe arthritis going on.  The patient has an appointment on November 8th and this was added to the appointment calendar.  Colletta Maryland the physical therapist is asking if the x-rays can be done prior to the appointment so that it can be discussed at the appointment and the physical therapy office can be notified.  Tended hip x-rays.  Please advise.  Thank you, Beryl Meager, RN  08/13/2020, 10:54        Regarding: call back request  ----- Message from Flonnie Hailstone II sent at 08/13/2020  8:37 AM EDT -----  Derek Jack, APRN,FNP-BC    [facility] calling in requesting a call back to discuss some tests for this pt. Please advise. Thank you.

## 2020-08-14 ENCOUNTER — Ambulatory Visit (HOSPITAL_BASED_OUTPATIENT_CLINIC_OR_DEPARTMENT_OTHER): Payer: Self-pay | Admitting: Family

## 2020-08-14 NOTE — Telephone Encounter (Addendum)
Plan of Care from [facility] from visit at PT on 07/26/20 faxed back to [facility] with confirmation to fax [phone]. Forms sent to be scanned into medical record. Beryl Meager, RN  08/14/2020, 16:08    Regarding: [facility]/ plan of care request  ----- Message from De Hollingshead sent at 08/14/2020  3:10 PM EDT -----  Derek Jack, APRN,FNP-BC    [facility] PT is requesting an updated plan of care.    Joylene Igo [phone]    Thanks    Andee Poles

## 2020-08-14 NOTE — Telephone Encounter (Addendum)
Patient called back.  See nurse triage from yesterday. Beryl Meager, RN  08/14/2020, 09:18        Regarding: [facility] Pt // Returning Levell Tavano's call  ----- Message from [place] sent at 08/14/2020  8:37 AM EDT -----  Derek Jack, APRN,FNP-BC    Pt returning call to clinic, please call back when you can.     Thanks,  [employer]

## 2020-08-14 NOTE — Telephone Encounter (Addendum)
Patient called back and notified of the right hip x-ray order.  Informed her she would be contacted to get that scheduled and she states understanding. Beryl Meager, RN  08/14/2020, 09:18        Left message for patient to return call.  [facility] back and spoke with [place], PT and informed her right hip x-ray has been ordered and we will have patient complete that once she returns call. She states understanding. Beryl Meager, RN  08/14/2020, 08:33

## 2020-08-20 ENCOUNTER — Ambulatory Visit
Admission: RE | Admit: 2020-08-20 | Discharge: 2020-08-20 | Disposition: A | Discharge status: Home / Self Care | Payer: Medicare Other | Source: Ambulatory Visit | Attending: Family | Admitting: Family

## 2020-08-20 ENCOUNTER — Other Ambulatory Visit: Payer: Self-pay

## 2020-08-20 ENCOUNTER — Encounter (HOSPITAL_BASED_OUTPATIENT_CLINIC_OR_DEPARTMENT_OTHER): Payer: Self-pay | Admitting: Family

## 2020-08-20 DIAGNOSIS — M25551 Pain in right hip: Secondary | ICD-10-CM | POA: Insufficient documentation

## 2020-08-20 DIAGNOSIS — Z9889 Other specified postprocedural states: Secondary | ICD-10-CM

## 2020-08-20 DIAGNOSIS — Z96641 Presence of right artificial hip joint: Secondary | ICD-10-CM

## 2020-08-26 ENCOUNTER — Encounter (HOSPITAL_BASED_OUTPATIENT_CLINIC_OR_DEPARTMENT_OTHER): Payer: Self-pay | Admitting: Family

## 2020-08-26 ENCOUNTER — Ambulatory Visit: Payer: Medicare Other | Attending: Family | Admitting: Family

## 2020-08-26 ENCOUNTER — Ambulatory Visit (HOSPITAL_COMMUNITY): Payer: Self-pay

## 2020-08-26 ENCOUNTER — Ambulatory Visit (HOSPITAL_BASED_OUTPATIENT_CLINIC_OR_DEPARTMENT_OTHER): Payer: Medicare Other

## 2020-08-26 ENCOUNTER — Other Ambulatory Visit: Payer: Self-pay

## 2020-08-26 ENCOUNTER — Encounter (HOSPITAL_COMMUNITY): Payer: Self-pay

## 2020-08-26 ENCOUNTER — Ambulatory Visit (HOSPITAL_BASED_OUTPATIENT_CLINIC_OR_DEPARTMENT_OTHER): Payer: Medicare Other | Admitting: Gynecology

## 2020-08-26 VITALS — BP 118/76 | HR 78 | Ht 65.0 in | Wt 221.3 lb

## 2020-08-26 DIAGNOSIS — Z79899 Other long term (current) drug therapy: Secondary | ICD-10-CM | POA: Insufficient documentation

## 2020-08-26 DIAGNOSIS — Z23 Encounter for immunization: Secondary | ICD-10-CM

## 2020-08-26 DIAGNOSIS — I10 Essential (primary) hypertension: Secondary | ICD-10-CM

## 2020-08-26 DIAGNOSIS — Z7982 Long term (current) use of aspirin: Secondary | ICD-10-CM | POA: Insufficient documentation

## 2020-08-26 DIAGNOSIS — Z7901 Long term (current) use of anticoagulants: Secondary | ICD-10-CM | POA: Insufficient documentation

## 2020-08-26 DIAGNOSIS — M25551 Pain in right hip: Secondary | ICD-10-CM | POA: Insufficient documentation

## 2020-08-26 LAB — PT/INR: INR: 1.9

## 2020-08-26 LAB — BASIC METABOLIC PANEL
ANION GAP: 9 mmol/L (ref 4–13)
BUN/CREA RATIO: 21 (ref 6–22)
BUN: 19 mg/dL (ref 8–25)
CALCIUM: 10.1 mg/dL (ref 8.8–10.2)
CHLORIDE: 98 mmol/L (ref 96–111)
CO2 TOTAL: 27 mmol/L (ref 23–31)
CREATININE: 0.89 mg/dL (ref 0.60–1.05)
ESTIMATED GFR: 68 mL/min/BSA (ref >=60)
GLUCOSE: 83 mg/dL (ref 65–125)
POTASSIUM: 4.1 mmol/L (ref 3.5–5.1)
SODIUM: 134 mmol/L — ABNORMAL LOW (ref 136–145)

## 2020-08-26 NOTE — Patient Instructions (Signed)
1 Revised: 16 August 2020        VACCINE INFORMATION FACT SHEET FOR RECIPIENTS AND CAREGIVERS   ABOUT COMIRNATY (COVID-19 VACCINE, mRNA)    AND THE PFIZER-BIONTECH COVID-19 VACCINE TO PREVENT CORONAVIRUS   DISEASE 2019 (COVID-19) FOR USE IN INDIVIDUALS 65 YEARS OF AGE AND   OLDER         FOR 73 YEARS OF AGE AND OLDER            You are being offered either COMIRNATY (COVID-19 Vaccine, mRNA) or the Pfizer-BioNTech COVID-19 Vaccine to prevent Coronavirus Disease 2019 (COVID-19) caused by SARS-CoV-2.       This Vaccine Information Fact Sheet for Recipients and Caregivers comprises the Fact Sheet for the authorized Pfizer-BioNTech COVID-19 Vaccine and also includes information about the FDA-licensed vaccine, COMIRNATY (COVID-19 Vaccine, mRNA) for use in individuals 49 years of age and older.       The FDA-approved COMIRNATY (COVID-19 Vaccine, mRNA) and the two formulations of Pfizer-BioNTech COVID-19 Vaccine authorized for Emergency Use Authorization (EUA) for ages 59 years and older, when prepared according to their respective instructions for use, can be used interchangeably.1      COMIRNATY (COVID-19 Vaccine, mRNA) is an FDA-approved COVID-19 vaccine made by [employer] for [employer]. It is approved as a 2-dose series for prevention of COVID-19 in individuals 41 years of age and older. It is also authorized under EUA to provide:   . a 2-dose primary series to individuals 12 through 15 years;    . a third primary series dose to individuals 77 years of age and older who have been determined to have certain kinds of immunocompromise; and    . a single booster dose to the following individuals who have completed a primary series with Pfizer-BioNTech COVID-19 Vaccine or COMIRNATY:   o  1 years of age and older    o  61 through 65 years of age at high risk of severe COVID-19      1 When prepared according to their respective instructions for use, the FDA-approved COMIRNATY   (COVID-19 Vaccine, mRNA) and the two  EUA-authorized formulations of Pfizer-BioNTech COVID-19 Vaccine for ages 44 years of age and older can be used interchangeably without presenting any safety or effectiveness concerns.                   o  58 through 65 years of age with frequent institutional or occupational exposure to SARS-CoV-2    . a single booster dose to eligible individuals who have completed primary vaccination with a different authorized COVID-19 vaccine. Booster eligibility and schedule are based on the labeling information of the vaccine used for the primary series.       The Pfizer-BioNTech COVID-19 Vaccine has received EUA from FDA to provide:   . a 2-dose primary series to individuals 3 years of age and older;    . a third primary series dose to individuals 50 years of age and older who have been determined to have certain kinds of immunocompromise; and    . a single booster dose to the following individuals who have completed a primary series with Pfizer-BioNTech COVID-19 Vaccine or COMIRNATY:   o 37 years of age and older o 72 through 65 years of age at high risk of severe COVID-19 o 53 through 65 years of age with frequent institutional or occupational exposure to SARS-CoV-2    . a single booster dose to eligible individuals who have completed primary vaccination with a different  authorized COVID-19 vaccine. Booster eligibility and schedule are based on the labeling information of the vaccine used for the primary series.        This Vaccine Information Fact Sheet contains information to help you understand the risks and benefits of COMIRNATY (COVID-19 Vaccine, mRNA) and the   Pfizer-BioNTech COVID-19 Vaccine, which you may receive because there is currently a pandemic of COVID-19. Talk to your vaccination provider if you have questions.      This Fact Sheet may have been updated. For the most recent Fact Sheet, please see [email or web address].      WHAT YOU NEED TO KNOW BEFORE YOU GET THIS VACCINE      WHAT IS COVID-19?   COVID-19  disease is caused by a coronavirus called SARS-CoV-2. You can get   COVID-19 through contact with another person who has the virus. It is predominantly a respiratory illness that can affect other organs. People with COVID-19 have had a wide range of symptoms reported, ranging from mild symptoms to severe illness leading to death. Symptoms may appear 2 to 14 days after exposure to the virus. Symptoms may include: fever or chills; cough; shortness of breath; fatigue; muscle or body aches; headache; new loss of taste or smell; sore throat; congestion or runny nose; nausea or vomiting; diarrhea.      WHAT IS COMIRNATY (COVID-19 VACCINE, mRNA) AND HOW IS IT RELATED TO THE PFIZER-BIONTECH COVID-19 VACCINE?      COMIRNATY (COVID-19 Vaccine, mRNA) and the Pfizer-BioNTech COVID-19 Vaccine when prepared according to their respective instructions for use, can be used interchangeably.      For more information on EUA, see the "What is an Emergency Use Authorization (EUA)?" section at the end of this Fact Sheet.       WHAT SHOULD YOU MENTION TO YOUR VACCINATION PROVIDER BEFORE YOU GET THE VACCINE?   Tell the vaccination provider about all of your medical conditions, including if you:   . have any allergies    . have had myocarditis (inflammation of the heart muscle) or pericarditis   (inflammation of the lining outside the heart)   . have a fever   . have a bleeding disorder or are on a blood thinner   . are immunocompromised or are on a medicine that affects your immune system   . are pregnant or plan to become pregnant   . are breastfeeding   . have received another COVID-19 vaccine   . have ever fainted in association with an injection      HOW IS THE VACCINE GIVEN?   The Pfizer-BioNTech COVID-19 Vaccine or COMIRNATY will be given to you as an injection into the muscle.      Primary Series: The vaccine is administered as a 2-dose series, 3 weeks apart. A third primary series dose may be administered at least 4 weeks after the  second dose to individuals who are determined to have certain kinds of immunocompromise.      Booster Dose:    . A single booster dose of the vaccine may be administered at least 6 months after completion of a primary series to individuals:   o 93 years of age and older   o 15 through 65 years of age at high risk of severe COVID-19 o 6 through 65 years of age with frequent institutional or occupational exposure to SARS-CoV-2    . A single booster dose of the vaccine may be administered to certain individuals who have completed primary vaccination with a  different authorized COVID-19 vaccine. Please check with your healthcare provider regarding eligibility for and timing of the booster dose.      The vaccine may not protect everyone.      WHO SHOULD NOT GET THE VACCINE?   You should not get the vaccine if you:   . had a severe allergic reaction after a previous dose of this vaccine   . had a severe allergic reaction to any ingredient of this vaccine.      WHAT ARE THE INGREDIENTS IN THE VACCINES?   COMIRNATY (COVID-19 Vaccine, mRNA) and the authorized formulations of the vaccine include the following ingredients:    . mRNA and lipids ((4-hydroxybutyl)azanediyl)bis(hexane-6,1-diyl)bis(2hexyldecanoate), 2 [(polyethylene glycol)-2000]-N,N-ditetradecylacetamide, 1,2Distearoyl-sn-glycero-3-phosphocholine, and cholesterol).      Pfizer-BioNTech COVID-19 vaccines for individuals 105 years of age and older contain 1 of the following sets of additional ingredients; ask the vaccination provider which version is being administered:   . potassium chloride, monobasic potassium phosphate, sodium chloride, dibasic sodium phosphate dihydrate, and sucrose   OR   . tromethamine, tromethamine hydrochloride, and sucrose      COMIRNATY (COVID-19 Vaccine, mRNA) contains the following additional ingredients: potassium chloride, monobasic potassium phosphate, sodium chloride, dibasic sodium phosphate dihydrate, and sucrose.      HAS THE  VACCINE BEEN USED BEFORE?   Yes. In clinical trials, approximately 23,000 individuals 86 years of age and older have received at least 1 dose of the vaccine. Data from these clinical trials supported the Emergency Use Authorization of the Pfizer-BioNTech COVID-19 Vaccines and the approval of COMIRNATY (COVID-19 Vaccine, mRNA). Millions of individuals have received the vaccine under EUA since September 29, 2019. The vaccine that is authorized for use in individuals 12 years and older includes two formulations; one that was studied in clinical trials and used under EUA, and one with the same mRNA and lipids but different inactive ingredients. The use of the different inactive ingredients helps stabilize the vaccine under refrigerated temperatures and the formulation can be administered without dilution.      WHAT ARE THE BENEFITS OF THE VACCINE?   The vaccine has been shown to prevent COVID-19.       The duration of protection against COVID-19 is currently unknown.      WHAT ARE THE RISKS OF THE VACCINE?   There is a remote chance that the vaccine could cause a severe allergic reaction. A severe allergic reaction would usually occur within a few minutes to 1 hour after getting a dose of the vaccine. For this reason, your vaccination provider may ask you to stay at the place where you received your vaccine for monitoring after vaccination. Signs of a severe allergic reaction can include:   Marland Kitchen Difficulty breathing   . Swelling of your face and throat   . A fast heartbeat   . A bad rash all over your body   . Dizziness and weakness      Myocarditis (inflammation of the heart muscle) and pericarditis (inflammation of the lining outside the heart) have occurred in some people who have received the vaccine. In most of these people, symptoms began within a few days following receipt of the second dose of vaccine. The chance of having this occur is very low. You should seek medical attention right away if you have any of the  following symptoms after receiving the vaccine:    . Chest pain   . Shortness of breath   . Feelings of having a fast-beating, fluttering, or pounding heart  Side effects that have been reported with the vaccine include:    . severe allergic reactions   . non-severe allergic reactions such as rash, itching, hives, or swelling of the face   . myocarditis (inflammation of the heart muscle)   . pericarditis (inflammation of the lining outside the heart)   . injection site pain   . tiredness   . headache   . muscle pain   . chills   . joint pain   . fever   . injection site swelling   . injection site redness   . nausea   . feeling unwell   . swollen lymph nodes (lymphadenopathy)   . decreased appetite   . diarrhea   . vomiting   . arm pain   . fainting in association with injection of the vaccine      These may not be all the possible side effects of the vaccine. Serious and unexpected side effects may occur. The possible side effects of the vaccine are still being studied in clinical trials.      WHAT SHOULD I DO ABOUT SIDE EFFECTS?   If you experience a severe allergic reaction, call 9-1-1, or go to the nearest hospital.      Call the vaccination provider or your healthcare provider if you have any side effects that bother you or do not go away.      Report vaccine side effects to FDA/CDC Vaccine Adverse Event Reporting System (VAERS). The VAERS toll-free number is 340-771-6212 or report online to [email or web address].https://www.[email or web address]/. Please include either "COMIRNATY (COVID-19 Vaccine, mRNA)" or "Pfizer-BioNTech COVID-19 Vaccine EUA", as appropriate, in the first line of box #18 of the report form.      In addition, you can report side effects to [employer]. at the contact information provided below.      Website  Fax number  Telephone number    www.pfizersafetyreporting.com  [phone]  [phone]       You may also be given an option to enroll in v-safe. V-safe is a new voluntary smartphone-based  tool that uses text messaging and web surveys to check in with people who have been vaccinated to identify potential side effects after COVID-19 vaccination. V-safe asks questions that help CDC monitor the safety of COVID-19 vaccines. V-safe also provides second-dose reminders if needed and live telephone follow-up by CDC if participants report a significant health impact following COVID-19 vaccination. For more information on how to sign up, visit: [email or web address].      WHAT IF I DECIDE NOT TO GET COMIRNATY (COVID-19 VACCINE, mRNA) OR THE PFIZER-BIONTECH COVID-19 VACCINE?    Under the EUA, it is your choice to receive or not receive the vaccine. Should you decide not to receive it, it will not change your standard medical care.      ARE OTHER CHOICES AVAILABLE FOR PREVENTING COVID-19 BESIDES COMIRNATY (COVID-19 VACCINE, mRNA) OR THE PFIZER-BIONTECH COVID-19 VACCINE?   Other vaccines to prevent COVID-19 may be available under Emergency Use Authorization.       CAN I RECEIVE THE COMIRNATY (COVID-19 VACCINE, mRNA) OR PFIZER-BIONTECH COVID-19 VACCINE AT THE SAME TIME AS OTHER VACCINES?   Data have not yet been submitted to FDA on administration of COMIRNATY (COVID-19 Vaccine, mRNA) or the Pfizer-BioNTech COVID-19 Vaccine at the same time with other vaccines. If you are considering receiving COMIRNATY (COVID-19 Vaccine, mRNA) or the Pfizer-BioNTech COVID-19 Vaccine with other vaccines, discuss your options with your healthcare provider.      WHAT IF I AM  IMMUNOCOMPROMISED?   If you are immunocompromised, you may receive a third dose of the vaccine. The third dose may still not provide full immunity to COVID-19 in people who are immunocompromised, and you should continue to maintain physical precautions to help prevent COVID-19. In addition, your close contacts should be vaccinated as appropriate.      WHAT IF I AM PREGNANT OR BREASTFEEDING?   If you are pregnant or breastfeeding, discuss your options with your  healthcare provider.      WILL THE VACCINE GIVE ME COVID-19?   No. The vaccine does not contain SARS-CoV-2 and cannot give you COVID-19.      KEEP YOUR VACCINATION CARD   When you get your first dose, you will get a vaccination card to show you when to return for your next dose(s) of the vaccine. Remember to bring your card when you return.      ADDITIONAL INFORMATION    If you have questions, visit the website or call the telephone number provided below.       To access the most recent Fact Sheets, please scan the QR code provided below.      Global website  Telephone number    [email or web address]        [phone]   ([phone]-VAX-CO19)          HOW CAN I LEARN MORE?   Marland Kitchen Ask the vaccination provider.   . Visit CDC at [email or web address].be.   . Visit FDA at [email or web address].hu.   Minette Brine your local or state public health department.      WHERE WILL MY VACCINATION INFORMATION BE RECORDED?    The vaccination provider may include your vaccination information in your state/local jurisdiction's Immunization Information System (IIS) or other designated system. This will ensure that you receive the same vaccine when you return for the second dose. For more information about IISs visit: [email or web address].      CAN I BE CHARGED AN ADMINISTRATION FEE FOR RECEIPT OF THE COVID-19 VACCINE?   No. At this time, the provider cannot charge you for a vaccine dose and you cannot be charged an out-of-pocket vaccine administration fee or any other fee if only receiving a COVID-19 vaccination. However, vaccination providers may seek appropriate reimbursement from a program or plan that covers COVID-19 vaccine administration fees for the vaccine recipient (private insurance, Medicare, Medicaid, [place] [HRSA] COVID-19 Uninsured  Program for noninsured recipients).      WHERE CAN I REPORT CASES OF SUSPECTED FRAUD?   Individuals becoming aware of any potential violations of the CDC COVID-19   Vaccination Program requirements are encouraged to report them to the Office of the [employer], U.S. Department of Health and [employer], at 1-800-HHS-TIPS or https://TIPS.HHS.GOV.      WHAT IS THE COUNTERMEASURES INJURY COMPENSATION PROGRAM?   The Countermeasures Injury Compensation Program (CICP) is a federal program that may help pay for costs of medical care and other specific expenses of certain people who have been seriously injured by certain medicines or vaccines, including this vaccine. Generally, a claim must be submitted to the CICP within one (1) year from the date of receiving the vaccine. To learn more about this program, visit https://www.bennett.info/ or call (845)574-4519.       WHAT IS AN EMERGENCY USE AUTHORIZATION (EUA)?   An Emergency Use Authorization (EUA) is a mechanism to facilitate the availability and use of medical products, including vaccines, during public health emergencies, such as the current COVID-19 pandemic. An EUA  is supported by a Risk manager (HHS) declaration that circumstances exist to justify the emergency use of drugs and biological products during the COVID-19 pandemic.       The FDA may issue an EUA when certain criteria are met, which includes that there are no adequate, approved, available alternatives. In addition, the FDA decision is based on the totality of scientific evidence available showing that the product may be effective to prevent COVID-19 during the COVID-19 pandemic and that the known and potential benefits of the product outweigh the known and potential risks of the product. All of these criteria must be met to allow for the product to be used in the treatment of patients during the COVID-19 pandemic.      This EUA for the Pfizer-BioNTech COVID-19 Vaccine and  COMIRNATY will end when the Secretary of HHS determines that the circumstances justifying the EUA no longer exist or when there is a change in the approval status of the product such that an EUA is no longer needed.               Manufactured by   [employer] [place] [address]          Manufactured for   [address]   [place], [place]      [id number]      Revised: 16 August 2020

## 2020-08-26 NOTE — Progress Notes (Cosign Needed)
Anticoagulation Encounter Note:    Surgical AVR plan   Warfarin start date: 01-06-2019   [facility] Staff: Earlie Raveling   DX : On-X AVR   INR Range: 1.5 - 2.0  Tab strength: warfarin 2mg    Contact 1: [phone] cell   Contact 2:   Lab: [facility]   Comments: Tylenol regularly    INR: 1.9    Current Dose: 6 mg Th/ 4 mg all other days    Clinical Outcomes     Positives:  INR In Range (INR good. pt called. )          Plan:  pt to continue current dosing and recheck INR in two weeks. Dosing verified by pt.     Next INR Date: 09/09/20    Leone Brand, BSN RN  Specialty Care Nurse, Anticoagulation  [facility]  Ph. [phone]  (ext. [phone])  Fax [phone]  [email or web address]        For additional information, see dosing calendar below.    Warfarin Therapy Instructions   October 2021 Details    Sun Molli Knock Tue Wed Thu Fri Sat          1               2                 3               4               5               6               7               8               9                 10               11               12               13               14               15               16                 17               18               19               20               21               22               23                 24               25      4  mg   See details      26      4 mg  27      4 mg         28      6 mg         29      4 mg         30      4 mg           31      4 mg                Date Details   10/25 Last INR check              Warfarin Therapy Instructions   November 2021 Details    Sun Mon Tue Wed Thu Fri Sat      1      4 mg         2      4 mg         3      4 mg         4      6 mg         5      4 mg         6      4 mg           7      4 mg         8   1.9   4 mg   See details      9      4 mg         10      4 mg         11      6 mg         12      4 mg         13      4 mg           14      4 mg         15      4 mg         16      4 mg         17      4 mg         18       6 mg         19      4 mg         20      4 mg           21      4 mg         22      4 mg         23               24               25               26               27                 28                29  30                    Date Details   11/08 This INR check   INR: 1.9       Date of next INR:  09/09/2020

## 2020-08-26 NOTE — Nursing Note (Signed)
1. Are you 65 years of age or older? yes  2. Have you ever had a severe reaction to a flu shot? no  3. Are you severely allergic to eggs, gentamicin, gelatin, arginine chicken feathers, or other vaccinations?no  4. Are you allergic to latex? no  5. Are you allergic to Thimerosol? no  6. Are you experiencing acute illness symptoms or have you been running a fever? no  7. Do you have a medical condition or taking medications that suppress your immune system? no  8. Have you ever had Guillain-Barre syndrome or other neurologic disorder? no  9. Are you pregnant or breastfeeding? no  10. Are you receiving aspirin or aspirin containing therapy? yes        WHO MUST GET A SECOND DOSE  *If answer to question 1 is no, and the child is under 43 years of age, please ask your nurse/provider about the need for a second dose if applicable.    Immunization administered     Name Date Dose VIS Date Route    Influenza Vaccine, 65+ 08/26/2020 0.5 mL 05/24/2020 Intramuscular    Site: Right deltoid    Given By: Kathrene Bongo, MA    Manufacturer: [employer]    Lot: [id number]    [place]: [id number]

## 2020-08-27 NOTE — Progress Notes (Signed)
[facility]: Family Medicine Office Visit  Progress Note    Subjective:   [name] is a 64 y.o. female with hypertension.  Hypertension treatment  HCTZ .  Pertinent history:taking medications as instructed, no medication side effects noted, home BP monitoring in range of [id number] systolic over 94T diastolic, no TIAs, no chest pain on exertion, no dyspnea on exertion, no swelling of ankles    Patient also has concern of having chronic right hip pain.  Dr. Larose Kells replaced her left hip and she feels as though she is in need of another hip replacement on her right side.  She is currently going to physical therapy for her hip and is not getting much improvement in terms of pain but does note that she feels a little bit stronger.  She has not called orthopaedics it to schedule an appointment with Dr. Sharlene Motts.  She did have an x-ray completed which shows advanced degenerative changes to the right hip.    Lab Results   Component Value Date    SODIUM 134 (L) 08/26/2020    POTASSIUM 4.1 08/26/2020    CHLORIDE 98 08/26/2020    CO2 27 08/26/2020    ANIONGAP 9 08/26/2020    BUN 19 08/26/2020    CREATININE 0.89 08/26/2020    BUNCRRATIO 21 08/26/2020    GFR 68 08/26/2020    GLUCOSENF 83 08/26/2020    CHOLESTEROL 194 01/22/2020    HDLCHOL 64 01/22/2020    LDLCHOL 113 (H) 01/22/2020    TRIG 87 01/22/2020       Medication list:  acetaminophen (TYLENOL) 500 mg Oral Tablet, Take 650 mg by mouth Every night   aspirin 81 mg Oral Tablet, Chewable, Take 1 Tablet (81 mg total) by mouth Once a day  Cholecalciferol, Vitamin D3, 50 mcg (2,000 unit) Oral Capsule, Take 2,000 Units by mouth Once a day   clobetasoL (CORMAX) 0.05 % Solution, APPLY SOLUTION TOPICALLY TO AFFECTED AREA TWICE DAILY  clobetasoL (TEMOVATE) 0.05 % Cream, by Apply Topically route Twice daily  Fluocinolone-Shower Cap 0.01 % Oil, Apply to scalp 1-2 times weekly at night.  fluticasone propionate (FLONASE) 50 mcg/actuation Nasal Spray, Suspension, 1  Spray by Each Nostril route Once a day  hydroCHLOROthiazide (HYDRODIURIL) 25 mg Oral Tablet, Take 1 Tablet (25 mg total) by mouth Once a day  loratadine (CLARITIN) 10 mg Oral Tablet, Take 10 mg by mouth Once a day  metoprolol succinate (TOPROL-XL) 50 mg Oral Tablet Sustained Release 24 hr, Take 1 Tablet (50 mg total) by mouth Once a day  nystatin (NYSTOP) 100,000 unit/gram Powder, by Apply Topically route Twice per day as needed  triamcinolone acetonide (ARISTOCORT A) 0.1 % Cream, Apply topically Twice daily  warfarin (COUMADIN) 2 mg Oral Tablet, Take 3 Tablets (6 mg total) by mouth Every evening    No facility-administered medications prior to visit.     Review of Systems - as per HPI.   Review of Systems   Constitutional: Negative for fatigue.   Eyes: Negative for visual disturbance.   Respiratory: Negative for cough, chest tightness, shortness of breath and wheezing.    Cardiovascular: Negative for chest pain, palpitations and leg swelling.   Musculoskeletal: Positive for arthralgias and gait problem.   Neurological: Negative for dizziness and headaches.      OBJECTIVE:  BP 118/76   Pulse 78   Ht 1.651 m (5\' 5" )   Wt 100 kg (221 lb 5.5 oz)   SpO2 98%   BMI 36.83 kg/m  Body mass index is 36.83 kg/m.  BP Readings from Last 3 Encounters:   08/26/20 118/76   07/15/20 (!) 141/84   05/14/20 121/81     Wt Readings from Last 3 Encounters:   08/26/20 100 kg (221 lb 5.5 oz)   07/15/20 102 kg (224 lb 3.3 oz)   05/14/20 102 kg (224 lb 3.3 oz)     General appearance: in no apparent distress and well developed and well nourished  General exam BP noted to be well controlled today in office, S1, S2 normal, no gallop, no murmur, chest clear, no JVD, no HSM, no edema.       XR HIP RIGHT performed on 08/20/2020 10:19 AM.    REASON FOR EXAM:  M25.551: Right hip pain    TECHNIQUE: 3 views/3 images submitted for interpretation.    COMPARISON:  Pelvic radiograph from 08/13/2016    FINDINGS:  Advanced degenerative changes  are present in the right hip, with complete superolateral joint space loss, subchondral sclerosis, and acetabular osteophytosis. There is slight flattening of the femoral head. Contralateral total hip arthroplasty appears stable.    IMPRESSION:  Progressive advanced degenerative right hip arthropathy.    ASSESSMENT:  PLAN:    ICD-10-CM    1. Essential hypertension  L37 BASIC METABOLIC PANEL   2. Right hip pain  M25.551    3. Flu vaccine need  Z23 Flu Vaccine, 65+,0.5 mL IM (Admin)   Hypertension: Doing well with HCTZ 25 mg and Metoprolol-XL 50 mg. Had labs in November. BP well controlled.    Right hip pain.  Patient is continuing physical therapy, but will schedule an appointment with Dr. Sharlene Motts for potential replacement.    Return in about 6 months (around 02/23/2021) for medicare wellness.   Patient was seen independently,  physician on site if consultation was needed.    Derek Jack, APRN,FNP-BC  FAMILY MEDICINE, [facility]  Operated by [facility]  [address]  [place] [address]  Dept: [phone]

## 2020-08-28 ENCOUNTER — Ambulatory Visit (HOSPITAL_BASED_OUTPATIENT_CLINIC_OR_DEPARTMENT_OTHER): Payer: Medicare Other | Admitting: Radiology

## 2020-08-28 ENCOUNTER — Ambulatory Visit (HOSPITAL_BASED_OUTPATIENT_CLINIC_OR_DEPARTMENT_OTHER)
Admission: RE | Admit: 2020-08-28 | Discharge: 2020-08-28 | Disposition: A | Discharge status: Home / Self Care | Payer: Medicare Other | Source: Ambulatory Visit | Admitting: Radiology

## 2020-08-28 ENCOUNTER — Other Ambulatory Visit: Payer: Self-pay

## 2020-08-28 ENCOUNTER — Encounter (HOSPITAL_BASED_OUTPATIENT_CLINIC_OR_DEPARTMENT_OTHER): Payer: Self-pay | Admitting: Orthopaedic Surgery

## 2020-08-28 ENCOUNTER — Ambulatory Visit: Payer: Medicare Other | Attending: Orthopaedic Surgery | Admitting: Orthopaedic Surgery

## 2020-08-28 VITALS — BP 139/76 | HR 79 | Temp 97.7°F | Ht 65.39 in | Wt 220.2 lb

## 2020-08-28 DIAGNOSIS — M1611 Unilateral primary osteoarthritis, right hip: Secondary | ICD-10-CM | POA: Insufficient documentation

## 2020-08-28 DIAGNOSIS — M2170 Unequal limb length (acquired), unspecified site: Secondary | ICD-10-CM | POA: Insufficient documentation

## 2020-08-28 DIAGNOSIS — M419 Scoliosis, unspecified: Secondary | ICD-10-CM | POA: Insufficient documentation

## 2020-08-28 DIAGNOSIS — M1711 Unilateral primary osteoarthritis, right knee: Secondary | ICD-10-CM

## 2020-08-28 DIAGNOSIS — M47896 Other spondylosis, lumbar region: Secondary | ICD-10-CM

## 2020-08-28 DIAGNOSIS — M4185 Other forms of scoliosis, thoracolumbar region: Secondary | ICD-10-CM

## 2020-08-28 DIAGNOSIS — M25552 Pain in left hip: Secondary | ICD-10-CM | POA: Insufficient documentation

## 2020-08-28 DIAGNOSIS — Z96642 Presence of left artificial hip joint: Secondary | ICD-10-CM

## 2020-08-28 DIAGNOSIS — M25561 Pain in right knee: Secondary | ICD-10-CM | POA: Insufficient documentation

## 2020-08-28 DIAGNOSIS — M25551 Pain in right hip: Secondary | ICD-10-CM | POA: Insufficient documentation

## 2020-08-28 NOTE — Progress Notes (Cosign Needed)
[facility]    SUBJECTIVE  [name] is a 64 y.o. female who returns to clinic today with a chief complaint of right hip pain. 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 Her right hip has become severely painful over the past year. She has a leg length discrepancy but perceives that her RLE is longer than LLE due to scoliosis. She takes tylenol for pain. She cannot take NSAIDs on a regular basis due to cardiac history. She tried PT over the past year for her R hip. She hasn't had right hip injection. She has associated R knee pain.     REVIEW OF SYSTEMS  General: negative for fevers or night sweats  Cardiovascular: negative for chest pain  Respiratory: negative for shortness of breath  Gastrointestinal: negative for digestive problems  Genitourinary: negative for urinary problems  Musculoskeletal: positive for hip pain, positive  for knee pain     All other systems reviewed and negative    PHYSICAL EXAMINATION   BP 139/76   Pulse 79   Temp 36.5 C (97.7 F)   Ht 1.661 m (5' 5.39")   Wt 99.9 kg (220 lb 3.8 oz)   BMI 36.21 kg/m     GEN:   NAD  PULM:   Normal respiratory effort.    MS:   RLE is externally rotation. RLE is about 1 inch shorter than LLE. -5 degrees internal rotation, 5 degrees external rotation.   NEURO:   Alert and oriented to person, place and time.    PSYCHOSOCIAL:   Pleasant.  Normal affect.    WOUND/INCISION:   No open wounds present.      RADIOGRAPHS  Radiographs were performed today and reviewed at this visit.    X-Ray right hip was interpreted by MD in clinic to show severe bone on bone arthritis with sclerosis and osteophytes present.  Right knee had mild arthritic changes.     ASSESSMENT     ICD-10-CM    1. Right knee pain  M25.561 XR [facility] Knee Series,Right   2. Bilateral hip pain  M25.551 XR HIPS BILATERAL-JOINT CENTER    M25.552    3. Osteoarthritis of right hip  M16.11 XR MAKO HIP RIGHT      [facility] x-ray   4. Scoliosis, unspecified scoliosis type, unspecified spinal region  M41.9 XR MAKO HIP RIGHT     [facility] x-ray   5. Leg length discrepancy  M21.70 XR MAKO HIP RIGHT     [facility] x-ray       PLAN  [name] is a 64 y.o. female who has severe right hip arthritis that is causing her right hip pain and some or most of her right knee pain. The patient would benefit from a right total hip arthroplasty (not Mako). The patient has exhausted conservative treatments. The patient would not benefit from physical therapy or injections given the severity of the bone on bone arthritis. The risks and benefits of a right total hip arthroplasty were discussed with the patient today. The patient would like to proceed. She understands we will not shorter then RLE. She may always feel her RLE is longer than LLE.  An OR card was filled out for the patient today. Our scheduler will be in contact with the patient. The patient will need medical clearance from [facility] prior to surgery and preoperative work up. The patient will most likely need  an inpatient stay post op. The patient was given a playbook today to work on pre-hab exercises.     The patient will need Mako right hip xray (to see spine films) and scoliosis xrays before surgery. Orders were placed.     The patient was told that she will have a follow up visit scheduled in the [facility] for H&P visit at which time right hip Mako x-rays and scoliosis xrays will be taken. Orders were placed in the system. The patient was instructed to call with any new or concerning symptoms. All of the patient's questions were answered and she agreed to this plan.    Patient seen independently with co-signing physician present in clinic.    Katie L Seifried, PA-C 08/28/2020, 11:15     Cc:   Derek Jack, APRN,FNP-BC  [place] [address]

## 2020-09-09 ENCOUNTER — Ambulatory Visit (HOSPITAL_COMMUNITY): Payer: Self-pay

## 2020-09-09 ENCOUNTER — Encounter (HOSPITAL_COMMUNITY): Payer: Self-pay

## 2020-09-09 LAB — PT/INR: INR: 1.9

## 2020-09-09 NOTE — Progress Notes (Signed)
Anticoagulation Encounter Note:    Surgical AVR plan   Warfarin start date: 01-06-2019   [facility] Staff: Earlie Raveling   DX : On-X AVR   INR Range: 1.5 - 2.0  Tab strength: warfarin 2mg    Contact 1: [phone] cell   Contact 2:   Lab: POC/[place] - [facility]   Comments: Tylenol regularly    INR: 1.9  Current Dose: 6mg  Thursday, 4 mg all other days  Clinical Outcomes     Positives:  INR In Range        Patient Findings     Negatives:  Change in health, Change in medications        Plan: 6 mg Thursday, 4 mg all other days; recheck in 2 weeks. Confirmed dosing and testing with patient.     Next INR Date: 12.6.21    Erie Noe, RN SCN  [facility]        For additional information, see dosing calendar below.    Warfarin Therapy Instructions   November 2021 Details    Sun [name] Thu Fri Sat      1               2               3               4               5               6                 7               8    1.9   4 mg   See details      9      4 mg         10      4 mg         11      6 mg         12      4 mg         13      4 mg           14      4 mg         15      4 mg         16      4 mg         17      4 mg         18      6 mg         19      4 mg         20      4 mg           21      4 mg         22   1.9   4 mg   See details      23      4 mg         24      4 mg         25      6 mg         26      4 mg  27      4 mg           28      4 mg         29      4 mg         30      4 mg              Date Details   11/08 Last INR check   INR: 1.9      11/22 This INR check   INR: 1.9              Warfarin Therapy Instructions   December 2021 Details    Sun Mon Tue Wed Thu Fri Sat        1      4 mg         2      6 mg         3      4 mg         4      4 mg           5      4 mg         6      4 mg         7               8               9               10               11                 12               13               14               15               16               17               18                  19               20               21               22               23               24               25                 26               27               28               29               30                31  Date Details   No additional details    Date of next INR:  09/23/2020

## 2020-09-23 ENCOUNTER — Ambulatory Visit (HOSPITAL_COMMUNITY): Payer: Self-pay

## 2020-09-23 ENCOUNTER — Encounter (HOSPITAL_COMMUNITY): Payer: Self-pay

## 2020-09-23 LAB — PT/INR: INR: 1.8

## 2020-09-23 NOTE — Progress Notes (Signed)
Anticoagulation Encounter Note:    Surgical AVR plan   Warfarin start date: 01-06-2019   [facility] Staff: Earlie Raveling   DX : On-X AVR   INR Range: 1.5 - 2.0  Tab strength: warfarin 2mg    Contact 1: [phone] cell   Contact 2:   Lab: POC/[place] - [facility]   Comments: Tylenol regularly    INR: 1.8    Current Dose: 6 mg Th/ 4 mg all other days    Clinical Outcomes     Positives:  INR In Range (INR good. LVM on cell for pt )          Plan:  pt to continue current dosing and recheck INR in two weeks. Number left for pt to call with questions.     Next INR Date: 10/07/20    Leone Brand, BSN RN  Specialty Care Nurse, Anticoagulation  [facility]  Ph. [phone]  (ext. [phone])  Fax [phone]  [email or web address]        For additional information, see dosing calendar below.    Warfarin Therapy Instructions   November 2021 Details    Sun [name] Thu Fri Sat      1               2               3               4               5               6                 7               8               9               10               11               12               13                 14               15               16               17               18               19               20                 21               22    1.9   4 mg   See details      23      4 mg         24      4 mg         25      6 mg         26      4 mg  27      4 mg           28      4 mg         29      4 mg         30      4 mg              Date Details   11/22 Last INR check   INR: 1.9              Warfarin Therapy Instructions   December 2021 Details    Sun Mon Tue Wed Thu Fri Sat        1      4 mg         2      6 mg         3      4 mg         4      4 mg           5      4 mg         6   1.8   4 mg   See details      7      4 mg         8      4 mg         9      6 mg         10      4 mg         11      4 mg           12      4 mg         13      4 mg         14      4 mg         15      4 mg         16      6 mg          17      4 mg         18      4 mg           19      4 mg         20      4 mg         21               22               23               24               25                 26               27               28               29                30  31                 Date Details   12/06 This INR check   INR: 1.8       Date of next INR:  10/07/2020

## 2020-10-04 ENCOUNTER — Encounter (HOSPITAL_COMMUNITY): Payer: Self-pay

## 2020-10-04 ENCOUNTER — Ambulatory Visit (HOSPITAL_COMMUNITY): Payer: Self-pay

## 2020-10-04 LAB — PT/INR: INR: 1.6

## 2020-10-06 NOTE — Progress Notes (Signed)
Anticoagulation Encounter Note:    Surgical AVR plan   Warfarin start date: 01-06-2019   [facility] Staff: Earlie Raveling   DX : On-X AVR   INR Range: 1.5 - 2.0  Tab strength: warfarin 2mg    Contact 1: [phone] cell   Contact 2:   Lab: POC/[facility] - [facility]   Comments: Tylenol regularly    INR: 1.6  Current Dose: 6 mg every Thu; 4 mg all other days    Clinical Outcomes       Positives:  INR In Range             Plan: 6 mg every Thu; 4 mg all other days; recheck in 2 weeks. Confirmed dosing and testing with patient.     Next INR Date: 12.30.21    Erie Noe, RN SCN  [facility]        For additional information, see dosing calendar below.    Warfarin Therapy Instructions   December 2021 Details    Sun [name] Thu Fri Sat        1               2               3               4                 5               6    1.8   4 mg   See details      7      4 mg         8      4 mg         9      6 mg         10      4 mg         11      4 mg           12      4 mg         13      4 mg         14      4 mg         15      4 mg         16      6 mg         17   1.6   4 mg   See details      18      4 mg           19      4 mg         20      4 mg         21      4 mg         22      4 mg         23      6 mg         24      4 mg         25      4 mg           26      4 mg  27      4 mg         28      4 mg         29      4 mg         30      6 mg         31                 Date Details   12/06 Last INR check     INR: 1.8      12/17 This INR check     INR: 1.6       Date of next INR:  10/17/2020

## 2020-10-17 ENCOUNTER — Ambulatory Visit (HOSPITAL_COMMUNITY): Payer: Self-pay

## 2020-10-17 ENCOUNTER — Encounter (HOSPITAL_COMMUNITY): Payer: Self-pay

## 2020-10-17 LAB — PT/INR: INR: 1.9

## 2020-10-17 NOTE — Progress Notes (Signed)
Anticoagulation Encounter Note:    Surgical AVR plan   Warfarin start date: 01-06-2019   [facility] Staff: Lynnell Jude   DX : On-X AVR   INR Range: 1.5 - 2.0  Tab strength: warfarin 2mg    Contact 1: [phone] cell   Contact 2:   Lab: POC/[place] - [facility]   Comments: Tylenol regularly    INR: 1.9    Current Dose: 6 mg Th/ 4 mg all other days    Clinical Outcomes       Positives:  INR In Range (INR good. LVM on cell for pt)             Plan:  pt to continue current dosing and recheck INR in two weeks. Number left for pt to call with questions.     Next INR Date: 10/31/20    11/02/20, BSN RN  Specialty Care Nurse, Anticoagulation  [facility]  Ph. [phone]  (ext. [phone])  Fax [phone]  [email or web address]        For additional information, see dosing calendar below.    Warfarin Therapy Instructions   December 2021 Details    Sun Thu Thu Fri Sat        1               2               3               4                 5               6               7               8               9               10               11                 12               13               14               15               16               17    1.6   4 mg   See details      18      4 mg           19      4 mg         20      4 mg         21      4 mg         22      4 mg         23      6 mg         24      4 mg         25      4 mg  26      4 mg         27      4 mg         28      4 mg         29      4 mg         30   1.9   6 mg   See details      31      4 mg           Date Details   12/17 Last INR check     INR: 1.6      12/30 This INR check     INR: 1.9              Warfarin Therapy Instructions   January 2022 Details    Sun Mon Tue Wed Thu Fri Sat           1      4 mg           2      4 mg         3      4 mg         4      4 mg         5      4 mg         6      6 mg         7      4 mg         8      4 mg           9      4 mg         10      4 mg         11      4 mg         12      4 mg          13      6 mg         14               15                 16               17               18               19               20               21               22                 23               24               25               26               27                28  29                 30               31                      Date Details   No additional details    Date of next INR:  10/31/2020

## 2020-10-21 ENCOUNTER — Encounter (HOSPITAL_BASED_OUTPATIENT_CLINIC_OR_DEPARTMENT_OTHER): Payer: Self-pay | Admitting: Family

## 2020-10-31 ENCOUNTER — Encounter (HOSPITAL_COMMUNITY): Payer: Self-pay

## 2020-10-31 ENCOUNTER — Ambulatory Visit (HOSPITAL_COMMUNITY): Payer: Self-pay

## 2020-10-31 LAB — PT/INR: INR: 1.6

## 2020-10-31 NOTE — Progress Notes (Signed)
Anticoagulation Encounter Note:    Surgical AVR plan   Warfarin start date: 01-06-2019   [facility] Staff: Earlie Raveling   DX : On-X AVR   INR Range: 1.5 - 2.0  Tab strength: warfarin 2mg    Contact 1: [phone] cell   Contact 2:   Lab: POC/[place] - [facility]   Comments: Tylenol regularly    INR: 1.6    Current Dose: 6 mg Th/ 4 mg all other days    Clinical Outcomes     Positives:  INR In Range (INR good. pt called. )          Plan:  pt to continue current dosing and recheck INR in two weeks. Dosing verified by pt.     Next INR Date: 11/14/20    Leone Brand, BSN RN  Specialty Care Nurse, Anticoagulation   [facility]  Ph. [phone]  (ext. [phone])  Fax [phone]  [email or web address]        For additional information, see dosing calendar below.    Warfarin Therapy Instructions   December 2021 Details    Sun [name] Thu Fri Sat        1               2               3               4                 5               6               7               8               9               10               11                 12               13               14               15               16               17               18                 19               20               21               22               23               24               25                 26                27  28               29               30    1.9   6 mg   See details      31      4 mg           Date Details   12/30 Last INR check   INR: 1.9              Warfarin Therapy Instructions   January 2022 Details    Sun Mon Tue Wed Thu Fri Sat           1      4 mg           2      4 mg         3      4 mg         4      4 mg         5      4 mg         6      6 mg         7      4 mg         8      4 mg           9      4 mg         10      4 mg         11      4 mg         12      4 mg         13   1.6   6 mg   See details      14      4 mg         15      4 mg           16      4 mg         17      4 mg         18      4 mg          19      4 mg         20      6 mg         21      4 mg         22      4 mg           23      4 mg         24      4 mg         25      4 mg         26      4 mg         27      6 mg         28               29                 30  31                     Date Details   01/13 This INR check   INR: 1.6       Date of next INR:  11/14/2020

## 2020-11-05 ENCOUNTER — Encounter (HOSPITAL_BASED_OUTPATIENT_CLINIC_OR_DEPARTMENT_OTHER): Payer: Medicare Other | Admitting: Family

## 2020-11-05 ENCOUNTER — Ambulatory Visit (HOSPITAL_BASED_OUTPATIENT_CLINIC_OR_DEPARTMENT_OTHER): Payer: Self-pay | Admitting: Medical Oncology

## 2020-11-12 ENCOUNTER — Other Ambulatory Visit: Payer: Self-pay

## 2020-11-14 ENCOUNTER — Ambulatory Visit (HOSPITAL_COMMUNITY): Payer: Self-pay

## 2020-11-14 ENCOUNTER — Encounter (HOSPITAL_COMMUNITY): Payer: Self-pay

## 2020-11-14 DIAGNOSIS — M1611 Unilateral primary osteoarthritis, right hip: Secondary | ICD-10-CM | POA: Insufficient documentation

## 2020-11-14 LAB — PT/INR: INR: 1.6

## 2020-11-14 NOTE — Progress Notes (Signed)
Anticoagulation Encounter Note:    Surgical AVR plan   Warfarin start date: 01-06-2019   [facility] Staff: Earlie Raveling   DX : On-X AVR   INR Range: 1.5 - 2.0  Tab strength: warfarin 2mg    Contact 1: [phone] cell   Contact 2:   Lab: POC/[place] - [facility]   Comments: Tylenol regularly    INR: 1.6  Current Dose: 6 mg (2 mg x 3) every Thu; 4 mg (2 mg x 2) all other days  Clinical Outcomes       Positives:  INR In Range          Patient Findings       Negatives:  Change in health, Change in medications, Change in diet/appetite          Plan: 6 mg (2 mg x 3) every Thu; 4 mg (2 mg x 2) all other days; recheck in two week. Confirmed dosing and testing with patient.     Next INR Date: 2.10.22    Erie Noe, RN SCN  [facility]        For additional information, see dosing calendar below.    Warfarin Therapy Instructions   January 2022 Details    Sun Molli Knock Tue Wed Thu Fri Sat           1                 2               3               4               5               6               7               8                 9               10               11               12               13    1.6   6 mg   See details      14      4 mg         15      4 mg           16      4 mg         17      4 mg         18      4 mg         19      4 mg         20      6 mg         21      4 mg         22      4 mg           23      4 mg         24      4 mg  25      4 mg         26      4 mg         27   1.6   6 mg   See details      28      4 mg         29      4 mg           30      4 mg         31      4 mg               Date Details   01/13 Last INR check     INR: 1.6      01/27 This INR check     INR: 1.6              Warfarin Therapy Instructions   February 2022 Details    Sun Mon Tue Wed Thu Fri Sat       1      4 mg         2      4 mg         3      6 mg         4      4 mg         5      4 mg           6      4 mg         7      4 mg         8      4 mg         9      4 mg         10      6 mg         11               12                  13               14               15               16               17               18               19                 20               21               22               23               24               25               26                  27  28                     Date Details   No additional details    Date of next INR:  11/28/2020

## 2020-11-14 NOTE — H&P (Addendum)
[facility] Program  Consultation  Laury Huizar  [phone]  11/15/2020    Problem List Items Addressed This Visit     Osteoarthritis of right hip  - Patient has failed conservative therapy, operative management is planned.  - Pain management as per surgical team.      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 Earlie Raveling, last visit 12/2019  - TTE 12/2019 demonstrated no AS, mild AI, mean gradient of 9 mmHg and max gradient of 16 mmHg, which is a normal gradient for the valve type and size. EF was 63.1%.  - continue Coumadin, managed by Dr. Earlie Raveling and the [facility]. She monitors INR at home.  - patient will call cardiology office to obtain a cardiology clearance appt to determine perioperative anticoagulation plan and any necessary bridging instructions.   - hold aspirin 7 days prior to surgery.      Psoriasis of scalp  - currently well-controlled, continue topical treatments       Hx of inverse psoriasis  - currently well-controlled, continue topical triamcinolone PRN  - currently no psoriasis over planned surgical site. Patient aware that surgical site must be clear of any rashes or lesions.      Cutaneous candidiasis  - continue topical Nystatin powder to affected areas      Postoperative atrial fibrillation (CMS HCC)  - patient developed Afib post-operatively after AVR, successfully converted to NSR with amiodarone  - she is on Coumadin for mechanical aortic valve replacement  - patient denies any further episodes of atrial fibrillation, denies any palpitations  - EKG ordered today  -patient denies personal history of stroke  - CHA2Ds2-VASc 3  - please place on telemetry post-operatively with any concerning si/sx       ADDENDUM, 11/19/2020  - hyponatremia on pre-op labs likely 2/2 HCTZ use. Stable and > 130. Should her sodium level drop below 130, recommend obtaining urine and serum osmolality and urine sodium.       Other Visit Diagnoses     Preop examination      - Pre-operative labs and EKG ordered, pending.  - Patient is at elevated risk of bleeding due to use of Coumadin.   - Patient is at acceptable risk of perioperative complications for the planned moderate risk surgery pending lab results, EKG, and cardiology clearance. Patient will get in to see her dentist for a cleaning in the near future. She is aware to call Dr. Wandra Scot office for prophylactic antibiotics.     Relevant Orders    CBC/DIFF    COMPREHENSIVE METABOLIC PANEL, NON-FASTING    ECG 12-LEAD (UTC ONLY)      Pain in extremity, unspecified extremity        Relevant Orders    PT/INR      Obesity (BMI 35.0-39.9 without comorbidity)      - Body mass index is 36.47 kg/m.  - Healthy weight loss efforts encouraged. Discussed referral to [facility], patient interested and referral placed.     Relevant Orders    HGA1C (HEMOGLOBIN A1C WITH EST AVG GLUCOSE)    Refer to [facility]  HPI:  Ellianna Dudzinski is a 65 y.o. female that presents with need for pre-operative medical  optimization. She has a history of severe arthritis of the right hip requiring total hip arthroplasty.     Patient has a medical history that includes HTN, aortic stenosis s/p mechanical AVR 12/2018, scalp psoriasis.     Post-operatively after her TAVR, she developed atrial fibrillation with successful conversion to NSR with amiodarone. Patient denies any further issues since this time. She remains on Coumadin. She has a home INR machine and follows with the anticoag clinic. She follows with cardiology, Dr. Earlie Raveling. Her last TTE was 12/2019, demonstrated no AS, mild AI, mean gradient of 9 mmHg and max gradient of 16 mmHg, which is a normal gradient for the valve  type and size. EF was 63.1%.    She reports a slight "twinge" of chest discomfort, has occurred 3-4 times and lasting briefly over the last few months. She denies any associated shortness of breath, dyspnea on exertion, pre-syncope/syncope, palpitations, orthopnea or PND. Has experienced some intermittent mild left lower extremity edema and possible mild edema of her left arm (watch feels tight) when she wakes up. This improves throughout the day.    Patient is able to achieve 4 METs without CV symptoms.  Patient denies previous DVT, family history of DVT or sleep apnea.   Patient denies any ongoing dental issues. Has been over a year since she last saw her dentist.  Regarding previous complications from anesthesia, she reports she had a reaction to Stadol in the past. She also reports mild PONV in the past.   Patient denies any family history of problems with anesthesia.    Patient denies testing positive for COVID-19 in the last 90 days. She has been vaccinated against COVID-19 + booster.     Denies significant allergy/sensitivity to metal, nickel, latex, or acrylic. She reports an anaphylactic allergy to IV contrast.     Pertinent Social History: tobacco use-No, alcohol use-socially, no daily use and drug use-No    Physical Exam  Vitals reviewed.   Constitutional:       General: She is not in acute distress.     Appearance: Normal appearance. She is obese. She is not ill-appearing.   HENT:      Head: Normocephalic and atraumatic.      Mouth/Throat:      Mouth: Mucous membranes are moist.      Pharynx: No oropharyngeal exudate or posterior oropharyngeal erythema.      Comments: There is no evidence of broken teeth or significant decay. Small chip in molar lower right side.  Eyes:      General: No scleral icterus.     Pupils: Pupils are equal, round, and reactive to light.   Neck:      Vascular: No carotid bruit.   Cardiovascular:      Rate and Rhythm: Normal rate and regular rhythm.      Heart sounds: Normal heart  sounds. No murmur heard.     Comments: Mechanical valve click  Pulmonary:      Effort: Pulmonary effort is normal. No respiratory distress.      Breath sounds: Normal breath sounds. No wheezing.   Abdominal:      General: Bowel sounds are normal. There is no distension.      Tenderness: There is no abdominal tenderness.   Musculoskeletal:      Right lower leg: Edema (trace) present.      Left lower leg: Edema (trace)  present.   Lymphadenopathy:      Cervical: No cervical adenopathy.   Skin:     General: Skin is warm and dry.      Comments: Small area of erythema under pannus along previous well-healed lower abdominal surgical incision.    Neurological:      Mental Status: She is alert and oriented to person, place, and time.   Psychiatric:         Mood and Affect: Mood normal.         Behavior: Behavior normal.         Thought Content: Thought content normal.         Judgment: Judgment normal.         ASA 3 - Patient with moderate systemic disease with functional limitations    > 4 METs - Climbing a flight of stairs    None  The risk of major cardiac complications varies according to the number of risk factors. If one includes only cardiac death, nonfatal MI, and nonfatal cardiac arrest as major cardiac events in the [place], the following rates of adverse outcomes were seen:  . No risk factors - 0.4 percent (95% CI 0.1-0.8 percent)   . One risk factor - 1.0 percent (95% CI 0.5-1.4 percent)   . Two risk factors - 2.4 percent (95% CI 1.3-3.5 percent)   . Three or more risk factors - 5.4 percent (95% CI 2.8-7.9 percent)  *Perioperative cardiac events in patients undergoing noncardiac surgery: a review of the magnitude of the problem, the pathophysiology of the events and methods to estimate and communicate risk.Devereaux PJ, Merilyn Baba [place]. 2005;173(6):627.    MDM  Reviewed: previous chart and vitals  Reviewed previous: labs, ECG and ultrasound (TTE)      TTE,  01/08/2020  Findings  Left Ventricle:   Normal left ventricular size. Concentric remodeling. Left ventricular systolic function is normal. Ejection Fraction is 63.1 %. No segmental/regional wall motion abnormalities  identified. Left ventricular diastolic parameters are normal.  Right Ventricle:   Normal right ventricular size. Normal right ventricular systolic function. Right ventricular systolic pressure is indeterminate.  Left Atrium:   Severely dilated left atrium.  Right Atrium:   The right atrium is of normal size.  Mitral Valve:   There is mild mitral regurgitation.  Tricuspid Valve:   There is no evidence of tricuspid regurgitation.  Aortic Valve:   The aortic valve is not well visualized. No Aortic valve stenosis. There is mild aortic regurgitation. AVR, 37mm On-X, mechanical valve is noted. The mean and peak gradients across AV  of 9 mm Hg and 16 mm Hg. Peak velocity across AV of 2 m/sec. DVI of 0.46 and AT of 60 msec.  Pulmonic Valve:   The pulmonic valve is not well visualized. There is no evidence of pulmonic valve regurgitation.  Pulmonary Artery:   The pulmonary artery is not well visualized.  Atrial Septum:   The interatrial septum is normal in appearance.  IVC/Hepatic Veins:   The inferior vena cava is of normal size.  Aorta:   The ascending aorta is mildly dilated. The aortic root is not well visualized.  Pericardium/Pleural space:   Normal pericardium with no pericardial effusion.      Patient was seen independently by Karle Plumber, FNP-C, with physician available in clinic for consultation.     Karle Plumber, APRN, FNP-C 11/15/2020  [facility]imization [facility]  [facility]  [place]

## 2020-11-15 ENCOUNTER — Ambulatory Visit (HOSPITAL_BASED_OUTPATIENT_CLINIC_OR_DEPARTMENT_OTHER): Payer: Medicare Other

## 2020-11-15 ENCOUNTER — Encounter (HOSPITAL_BASED_OUTPATIENT_CLINIC_OR_DEPARTMENT_OTHER): Payer: Self-pay | Admitting: Medical Oncology

## 2020-11-15 ENCOUNTER — Other Ambulatory Visit: Payer: Self-pay

## 2020-11-15 ENCOUNTER — Ambulatory Visit (HOSPITAL_COMMUNITY): Payer: Self-pay

## 2020-11-15 ENCOUNTER — Ambulatory Visit
Admission: RE | Admit: 2020-11-15 | Discharge: 2020-11-15 | Disposition: A | Discharge status: Home / Self Care | Payer: Medicare Other | Source: Ambulatory Visit | Attending: Medical Oncology | Admitting: Medical Oncology

## 2020-11-15 ENCOUNTER — Ambulatory Visit (HOSPITAL_BASED_OUTPATIENT_CLINIC_OR_DEPARTMENT_OTHER): Payer: Medicare Other | Admitting: Medical Oncology

## 2020-11-15 VITALS — BP 150/70 | HR 67 | Temp 96.8°F | Ht 65.35 in | Wt 221.6 lb

## 2020-11-15 DIAGNOSIS — I4891 Unspecified atrial fibrillation: Secondary | ICD-10-CM

## 2020-11-15 DIAGNOSIS — M79609 Pain in unspecified limb: Secondary | ICD-10-CM | POA: Insufficient documentation

## 2020-11-15 DIAGNOSIS — L409 Psoriasis, unspecified: Secondary | ICD-10-CM

## 2020-11-15 DIAGNOSIS — M1611 Unilateral primary osteoarthritis, right hip: Secondary | ICD-10-CM

## 2020-11-15 DIAGNOSIS — I9789 Other postprocedural complications and disorders of the circulatory system, not elsewhere classified: Secondary | ICD-10-CM | POA: Insufficient documentation

## 2020-11-15 DIAGNOSIS — Z01818 Encounter for other preprocedural examination: Secondary | ICD-10-CM

## 2020-11-15 DIAGNOSIS — Z952 Presence of prosthetic heart valve: Secondary | ICD-10-CM

## 2020-11-15 DIAGNOSIS — E669 Obesity, unspecified: Secondary | ICD-10-CM

## 2020-11-15 DIAGNOSIS — I10 Essential (primary) hypertension: Secondary | ICD-10-CM

## 2020-11-15 DIAGNOSIS — Z872 Personal history of diseases of the skin and subcutaneous tissue: Secondary | ICD-10-CM | POA: Insufficient documentation

## 2020-11-15 DIAGNOSIS — M25551 Pain in right hip: Secondary | ICD-10-CM

## 2020-11-15 LAB — CBC WITH DIFF
BASOPHIL #: <0.1 10*3/uL (ref <=0.20)
BASOPHIL %: 0 %
EOSINOPHIL #: <0.1 10*3/uL (ref <=0.50)
EOSINOPHIL %: 1 %
HCT: 43.6 % (ref 34.8–46.0)
HGB: 14.1 g/dL (ref 11.5–16.0)
IMMATURE GRANULOCYTE #: <0.1 10*3/uL (ref <0.10)
IMMATURE GRANULOCYTE %: 0 % (ref 0–1)
LYMPHOCYTE #: 1.54 10*3/uL (ref 1.00–4.80)
LYMPHOCYTE %: 27 %
MCH: 30 pg (ref 26.0–32.0)
MCHC: 32.3 g/dL (ref 31.0–35.5)
MCV: 92.8 fL (ref 78.0–100.0)
MONOCYTE #: 0.55 10*3/uL (ref 0.20–1.10)
MONOCYTE %: 10 %
MPV: 9.2 fL (ref 8.7–12.5)
NEUTROPHIL #: 3.48 10*3/uL (ref 1.50–7.70)
NEUTROPHIL %: 62 %
PLATELETS: 254 10*3/uL (ref 150–400)
RBC: 4.7 10*6/uL (ref 3.85–5.22)
RDW-CV: 12.1 % (ref 11.5–15.5)
WBC: 5.7 10*3/uL (ref 3.7–11.0)

## 2020-11-15 LAB — COMPREHENSIVE METABOLIC PANEL, NON-FASTING
ALBUMIN: 4.4 g/dL (ref 3.4–4.8)
ALKALINE PHOSPHATASE: 69 U/L (ref 55–145)
ALT (SGPT): 14 U/L (ref 8–22)
ANION GAP: 6 mmol/L (ref 4–13)
AST (SGOT): 20 U/L (ref 8–45)
BILIRUBIN TOTAL: 0.5 mg/dL (ref 0.3–1.3)
BUN/CREA RATIO: 24 — ABNORMAL HIGH (ref 6–22)
BUN: 18 mg/dL (ref 8–25)
CALCIUM: 9.7 mg/dL (ref 8.8–10.2)
CHLORIDE: 99 mmol/L (ref 96–111)
CO2 TOTAL: 27 mmol/L (ref 23–31)
CREATININE: 0.74 mg/dL (ref 0.60–1.05)
ESTIMATED GFR: 85 mL/min/BSA (ref >=60)
GLUCOSE: 83 mg/dL (ref 65–125)
POTASSIUM: 4.1 mmol/L (ref 3.5–5.1)
PROTEIN TOTAL: 7.9 g/dL (ref 6.0–8.0)
SODIUM: 132 mmol/L — ABNORMAL LOW (ref 136–145)

## 2020-11-15 LAB — HGA1C (HEMOGLOBIN A1C WITH EST AVG GLUCOSE)
ESTIMATED AVERAGE GLUCOSE: 103 mg/dL
HEMOGLOBIN A1C: 5.2 % (ref 4.0–5.6)

## 2020-11-15 LAB — ECG 12-LEAD (UTC ONLY)
Atrial Rate: 65 {beats}/min
Calculated P Axis: 53 degrees
Calculated R Axis: 34 degrees
Calculated T Axis: 57 degrees
PR Interval: 138 ms
QRS Duration: 86 ms
QT Interval: 414 ms
QTC Calculation: 430 ms
Ventricular rate: 65 {beats}/min

## 2020-11-15 LAB — PT/INR
INR: 1.66 — ABNORMAL HIGH (ref 0.80–1.20)
PROTHROMBIN TIME: 19.3 seconds — ABNORMAL HIGH (ref 9.1–13.9)

## 2020-11-18 NOTE — Nursing Note (Signed)
INR draw as part of labs for doctors appointment. Pt had checked home INR day previous and was same reading as lab draw. Pt has her instructions and will recheck on date given.     Leone Brand, BSN RN  Specialty Care Nurse, Anticoagulation   [facility]  Ph. [phone]  (ext. [phone])  Fax [phone]  [email or web address]

## 2020-11-28 ENCOUNTER — Encounter (HOSPITAL_COMMUNITY): Payer: Self-pay

## 2020-11-28 ENCOUNTER — Ambulatory Visit (HOSPITAL_COMMUNITY): Payer: Self-pay

## 2020-11-28 LAB — PT/INR: INR: 1.4

## 2020-11-28 NOTE — Progress Notes (Signed)
Anticoagulation Encounter Note:    Surgical AVR plan   Warfarin start date: 01-06-2019   [facility] Staff: Earlie Raveling   DX : On-X AVR   INR Range: 1.5 - 2.0  Tab strength: warfarin 2mg    Contact 1: [phone] cell   Contact 2:   Lab: POC/[place] - [facility]   Comments: Tylenol regularly    INR: 1.4    Current Dose: 6 mg Th/ 4 mg all other days    Clinical Outcomes       Positives:  INR Below Goal (INR low. pt called. )          Patient Findings       Positives:  Change in health (awaiting clearence for hip replacement), Change in medications (states has been taking more tylenol than normal due to severe hip pain)          Plan: pt instructed to start warfarin 6 mg M,Th/ 4 mg all other days and recheck INR in two weeks. Dosing verified by pt.     Next INR Date: 12/12/20    Leone Brand, BSN RN  Specialty Care Nurse, Anticoagulation  [facility]  Ph. [phone]  (ext. [phone])  Fax [phone]  [email or web address]        For additional information, see dosing calendar below.    Warfarin Therapy Instructions   January 2022 Details    Sun Molli Knock Tue Wed Thu Fri Sat           1                 2               3               4               5               6               7               8                 9               10               11               12               13               14               15                 16               17               18               19               20               21               22                  23  24               25               26               27               28    1.66   4 mg   See details      29      4 mg           30      4 mg         31      4 mg               Date Details   01/28 Last INR check     INR: 1.66              Warfarin Therapy Instructions   February 2022 Details    Sun [name] Thu Fri Sat       1      4 mg         2      4 mg         3      6 mg         4      4 mg         5      4 mg           6      4 mg         7      4  mg         8      4 mg         9      4 mg         10   1.4   6 mg   See details      11      4 mg         12      4 mg           13      4 mg         14      6 mg         15      4 mg         16      4 mg         17      6 mg         18      4 mg         19      4 mg           20      4 mg         21      6 mg         22      4 mg         23      4 mg         24      6 mg         25               26  27               28                     Date Details   02/10 This INR check     INR: 1.4       Date of next INR:  12/12/2020

## 2020-12-02 NOTE — Progress Notes (Unsigned)
Outpatient Progress Note  Cardiology     Information Obtained from: patient and history reviewed via medical record  Chief Complaint:  AVR    HPI:    65 YO F who follows with [place] for AVR in 01/04/2019 with a 21 mm On-X mechanical aortic valve. She was last seen 01/16/2020 and at that time was doing well. She continues to do well from a CV perspective at today's appointment. She denies angina, dyspnea, palpitations, syncope or bleeding problems. She has been compliant with ASA, and Coumadin, she needs a refill on prophylactic antibiotics.    She had a f/u TTE 01/08/2020 which showed no AS, mild AI, mean gradient of 9 mmHg and max gradient of 16 mmHg, which is a normal gradient for the valve type and size. EF was 63.1%.    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  Former 57 Pack year smoker.  Hip replacement in 05/2016     Medications  ASA 81  HCTZ 25  Toprol 50 daily  Coumadin INR goal 1.5-2, has a home machine.     Social History  Former smoker 35 pack year quitting in 2014.  Occasional ETOH.     Family History  Denies a family history of premature CAD    ROS  Other than HPI a 10 point ROS was negative.    Objective  Temperature: 36.3 C (97.3 F)  Heart Rate: 84  BP (Non-Invasive): 133/79  SpO2: 96 %  Constitutional: This is a well developed well nourished female  Eyes: EOMI, no scleral jaundice or conjunctival erythema noted.   HENT: Normocephalic and atraumatic.  Neck:  Supple, symmetric, trachea midline, no masses. The thyroid appears normal. There is no JVD.  Respiratory: Lungs are clear to auscultation. No wheezing, rales or rhonchi noted. There is normal respiratory effort.  Cardiovascular: S1 normal, S2 mechanical. The rhythm is regular. There is no click, murmur, rub or gallop noted.   Lower Extremities:  No cyanosis or edema noted.  Gastrointestinal: Normal bowel sounds  The abdomen is soft, non-distended, non-tender  with out guarding or rebound. There are no masses appreciated.  Musculoskeletal: No obvious deformity or swelling.   Skin: normal coloration and turgor, no rashes noted.  Neurologic: CN's Grossly intact, alert and oriented X 3.  Genitourinary: Deferred  Lymphatic: No lymphadenopathy appreciated.  Psychiatric: appears in good mood, fluent in speech and cognition.     Assessment Plan    65 yo F seen for follow up of her mechanical aortic valve, an On-X 21, placed 01/04/2019. Her f/u TTE shows a well functioning prosthesis. She has a dilated left atrium likely related to her hypertension and history of severe AS.    Severe AS s/p AVR with an On-X 21 01/04/2019  Mild mitral regurgitation  For AVR repeat TTE at 5 and 10 years post operatively or with changes in symptoms.  The patient should take antibiotic prophylaxis lifelong given the presence of a prosthetic valve, 30-60 minutes prior to dental work, respiratory tract procedures, skin / soft tissue procedures. Oral amoxicillin 2 grams.   She does not need bridging for the upcoming procedure as this is a newer generation bi leaflet aortic valve, she can hold her coumadin for 3-5 days preoperatively.  She should be placed back on anticoagulation as soon as possible however post procedure. If possible would continue ASA perioperatively.  She needs no further cardiac workup perioperatively she  has no known significant coronary artery disease and no concerning symptoms.  If there are any further questions please reach out.     Alda Berthold, MD  Fellow, Cardiovascular Disease        I saw and examined the patient.  I reviewed the resident's note.  I agree with the findings and plan of care as documented in the resident's note.  Any exceptions/additions are edited/noted.    Sammuel Cooper, MD

## 2020-12-03 ENCOUNTER — Other Ambulatory Visit: Payer: Self-pay

## 2020-12-03 ENCOUNTER — Encounter (HOSPITAL_COMMUNITY): Payer: Self-pay | Admitting: Hospitalist

## 2020-12-03 ENCOUNTER — Ambulatory Visit (HOSPITAL_COMMUNITY): Payer: Self-pay | Admitting: Hospitalist

## 2020-12-03 ENCOUNTER — Ambulatory Visit: Payer: Medicare Other | Attending: Hospitalist | Admitting: Hospitalist

## 2020-12-03 VITALS — BP 133/79 | HR 84 | Temp 97.3°F | Ht 65.0 in | Wt 224.2 lb

## 2020-12-03 DIAGNOSIS — I340 Nonrheumatic mitral (valve) insufficiency: Secondary | ICD-10-CM

## 2020-12-03 DIAGNOSIS — Z952 Presence of prosthetic heart valve: Secondary | ICD-10-CM | POA: Insufficient documentation

## 2020-12-03 DIAGNOSIS — I10 Essential (primary) hypertension: Secondary | ICD-10-CM | POA: Insufficient documentation

## 2020-12-03 DIAGNOSIS — I350 Nonrheumatic aortic (valve) stenosis: Secondary | ICD-10-CM

## 2020-12-03 LAB — COMPREHENSIVE METABOLIC PANEL, NON-FASTING
ALBUMIN: 4.1 g/dL (ref 3.4–4.8)
ALKALINE PHOSPHATASE: 81 U/L (ref 55–145)
ALT (SGPT): 13 U/L (ref 8–22)
ANION GAP: 4 mmol/L (ref 4–13)
AST (SGOT): 19 U/L (ref 8–45)
BILIRUBIN TOTAL: 0.3 mg/dL (ref 0.3–1.3)
BUN/CREA RATIO: 32 — ABNORMAL HIGH (ref 6–22)
BUN: 23 mg/dL (ref 8–25)
CALCIUM: 9.3 mg/dL (ref 8.8–10.2)
CHLORIDE: 97 mmol/L (ref 96–111)
CO2 TOTAL: 30 mmol/L (ref 23–31)
CREATININE: 0.71 mg/dL (ref 0.60–1.05)
ESTIMATED GFR: 89 mL/min/BSA (ref >=60)
GLUCOSE: 117 mg/dL (ref 65–125)
POTASSIUM: 3.6 mmol/L (ref 3.5–5.1)
PROTEIN TOTAL: 7.6 g/dL (ref 6.0–8.0)
SODIUM: 131 mmol/L — ABNORMAL LOW (ref 136–145)

## 2020-12-03 LAB — CBC WITH DIFF
BASOPHIL #: <0.1 10*3/uL (ref <=0.20)
BASOPHIL %: 1 %
EOSINOPHIL #: <0.1 10*3/uL (ref <=0.50)
EOSINOPHIL %: 1 %
HCT: 42.6 % (ref 34.8–46.0)
HGB: 13.8 g/dL (ref 11.5–16.0)
IMMATURE GRANULOCYTE #: <0.1 10*3/uL (ref <0.10)
IMMATURE GRANULOCYTE %: 0 % (ref 0–1)
LYMPHOCYTE #: 1.68 10*3/uL (ref 1.00–4.80)
LYMPHOCYTE %: 29 %
MCH: 29.9 pg (ref 26.0–32.0)
MCHC: 32.4 g/dL (ref 31.0–35.5)
MCV: 92.2 fL (ref 78.0–100.0)
MONOCYTE #: 0.43 10*3/uL (ref 0.20–1.10)
MONOCYTE %: 7 %
MPV: 9 fL (ref 8.7–12.5)
NEUTROPHIL #: 3.66 10*3/uL (ref 1.50–7.70)
NEUTROPHIL %: 62 %
PLATELETS: 260 10*3/uL (ref 150–400)
RBC: 4.62 10*6/uL (ref 3.85–5.22)
RDW-CV: 12.2 % (ref 11.5–15.5)
WBC: 5.9 10*3/uL (ref 3.7–11.0)

## 2020-12-03 MED ORDER — AMOXICILLIN 500 MG CAPSULE
2000.0000 mg | ORAL_CAPSULE | Freq: Once | ORAL | 12 refills | Status: DC | PRN
Start: 2020-12-03 — End: 2021-07-24

## 2020-12-03 MED ORDER — AMOXICILLIN 500 MG CAPSULE
2000.0000 mg | ORAL_CAPSULE | Freq: Once | ORAL | 12 refills | Status: DC | PRN
Start: 2020-12-03 — End: 2020-12-03

## 2020-12-03 NOTE — Telephone Encounter (Signed)
-----   Message from Willaim Sheng, RN sent at 12/03/2020  3:37 PM EST -----  Regarding: FW: patient of Dr Javad Salva: Cecelia Byars medication resent  ----- Message from [place] sent at 12/03/2020  3:35 PM EST -----  Patient of Dr Luisa Hart     Lambert Mody is calling in from [place] about the patient's medication below.     Lambert Mody is stating that the medication below was only written for 1 capsule and she is needing it to be written for the 4 capsules.     She is requesting it be resent to the pharmacy below.     If you have any questions, she can be reached at [phone]    Current Outpatient Medications:    amoxicillin (AMOXIL) 500 mg Oral Capsule, Take 4 Capsules (2,000 mg total) by mouth Once, as needed (Prosthetic heart valve prophylaxis prior to dental, respiratory procedures.) for up to 1 dose    Preferred [facility], [place]    [place] [address]    Phone: [phone] Fax: [phone]    Hours: Not open 24 hours     Thank You

## 2020-12-04 ENCOUNTER — Encounter (HOSPITAL_BASED_OUTPATIENT_CLINIC_OR_DEPARTMENT_OTHER): Payer: Self-pay | Admitting: Family

## 2020-12-04 ENCOUNTER — Ambulatory Visit: Payer: Medicare Other | Attending: Family | Admitting: Family

## 2020-12-04 VITALS — BP 143/78 | HR 78 | Temp 97.7°F | Wt 222.4 lb

## 2020-12-04 DIAGNOSIS — Z85828 Personal history of other malignant neoplasm of skin: Secondary | ICD-10-CM | POA: Insufficient documentation

## 2020-12-04 DIAGNOSIS — L409 Psoriasis, unspecified: Secondary | ICD-10-CM | POA: Insufficient documentation

## 2020-12-04 NOTE — Progress Notes (Signed)
Dermatology, [facility]  [place] [address]  [phone]    Date:   12/04/2020  Name: [name]  Age: 65 y.o.    Chief complaint: Skin Check    HPI  [name] is a 65 y.o. female with a history of BCC on her left upper lip s/p MOHs on 03/08/2019 presenting for a skin check. Patient has a history of scalp psoriasis. States she has been using Clobetasol solution and derma-smoothe oil with improvement. 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 Otherwise denies any new, changing, bleeding, or rapidly growing lesions.  No other skin-related complaints. Her sister has a history of a NMSC.       Review of Systems   Constitutional: Negative for chills and fever.   Skin: Positive for itching and rash.     Current Medications  . acetaminophen (TYLENOL) 500 mg Oral Tablet Take 650 mg by mouth Every night   . amoxicillin (AMOXIL) 500 mg Oral Capsule Take 4 Capsules (2,000 mg total) by mouth Once, as needed (Prosthetic heart valve prophylaxis prior to dental, respiratory procedures.) for up to 1 dose   . ascorbic acid, vitamin C, (VITAMIN C) 500 mg Oral Tablet Take 250 mg by mouth Once a day   . aspirin 81 mg Oral Tablet, Chewable Take 1 Tablet (81 mg total) by mouth Once a day   . Cholecalciferol, Vitamin D3, 50 mcg (2,000 unit) Oral Capsule Take 2,000 Units by mouth Once a day    . clobetasoL (CORMAX) 0.05 % Solution APPLY SOLUTION TOPICALLY TO AFFECTED AREA TWICE DAILY   . clobetasoL (TEMOVATE) 0.05 % Cream by Apply Topically route Twice daily   . Fluocinolone-Shower Cap 0.01 % Oil Apply to scalp 1-2 times weekly at night.   . fluticasone propionate (FLONASE) 50 mcg/actuation Nasal Spray, Suspension 1 Spray by Each Nostril route Once a day   . hydroCHLOROthiazide (HYDRODIURIL) 25 mg Oral Tablet Take 1 Tablet (25 mg total) by mouth Once a day   . loratadine (CLARITIN) 10 mg Oral Tablet Take 10 mg by mouth Once a day   .  metoprolol succinate (TOPROL-XL) 50 mg Oral Tablet Sustained Release 24 hr Take 1 Tablet (50 mg total) by mouth Once a day   . nystatin (NYSTOP) 100,000 unit/gram Powder by Apply Topically route Twice per day as needed   . triamcinolone acetonide (ARISTOCORT A) 0.1 % Cream Apply topically Twice daily (Patient not taking: No sig reported)   . warfarin (COUMADIN) 2 mg Oral Tablet Take 3 Tablets (6 mg total) by mouth Every evening (Patient taking differently: Take 6 mg by mouth Every evening 4mg  and 6 mg on Thursday)     Allergies   Allergen Reactions   . Iv Contrast Anaphylaxis   . Oregano      Hives     . Stadol [Butorphanol Tartrate] Mental Status Effect   . Tomato Hives/ Urticaria     Past Medical History:   Diagnosis Date   . Abdominal hernia 04/29/2020    hx of 3 repairs   . Anemia    . Aortic stenosis    . Arthritis    . Atrial fibrillation (CMS HCC) 04/29/2020    one episode  after AVR   . Back problem 04/29/2020    LBP   . Beta blocker prescribed for left ventricular systolic dysfunction    . Blood in stool     positive cologuard.  Has  not yet scheduled colonoscopy   . Blood thinned due to long-term anticoagulant use 04/29/2020    coumadin   . Cancer (CMS HCC)     skin cancer L lip.  Needs MOHS to be scheduled   . Chronic pain 04/29/2020    back and right hip   . Colon polyp    . Heart murmur 01/04/2019    AVR   . High blood pressure    . Motion sickness    . Obesity    . Rash 04/29/2020    scalp psoriasis and involves R ear   . Valvular disease     aortic valve stenosis, following with CT surgery, echo 02/2016 and cath 03/2016   . Wears glasses          Physical Exam  Vitals: Blood pressure (!) 143/78, pulse 78, temperature 36.5 C (97.7 F), temperature source Thermal Scan, weight 101 kg (222 lb 7.1 oz), not currently breastfeeding.  Physical Exam  Constitutional:       General: She is not in acute distress.     Appearance: Normal appearance.   HENT:      Head:     Skin:     General: Skin is warm and dry.       Coloration: Skin is not pale.          Neurological:      Mental Status: She is alert and oriented to person, place, and time. Mental status is at baseline.      Coordination: Coordination normal.   Psychiatric:         Mood and Affect: Mood normal.     General skin exam was performed including head, neck, anterior/posterior trunk, bilateral upper & lower extremities and revealed no areas of concern other than those documented.    Assessment and Plan  Problem List Items Addressed This Visit    None       #History of BCC s/p MOHs on 03/08/2019 (see #1 on face diagram) - stable   - No reoccurrence noted  -Will follow     #Scalp Psoriasis (See #2 on head diagram) - stable   - We discussed the diagnosis, etiology, natural course and management of this condition. Declines systemic therapy at this time. Consider Otezla in the future  - Continue clobetasol solution BID prn   - Continue Fluocinolone 0.01% oil 1-2 times weekly.  Reviewed risk of skin atrophy with prolonged use.  - Start OTC T-sal (SA shampoo)    The patient was educated on the importance of avoiding excessive sun exposure and wearing sunscreen daily SPF 30 or higher.  Advised patient to re-apply sunscreen every 2-3 hours.  Advised the patient to avoid going to and using tanning beds.  Advised to check skin routinely for any changes, especially any new moles or changes in existing moles.      RTC in 6 months or sooner if needed    This visit was rendered independently with a supervising physician on site.     Senan Urey, APRN,FNP-BC  12/04/2020, 13:00

## 2020-12-05 ENCOUNTER — Encounter (HOSPITAL_BASED_OUTPATIENT_CLINIC_OR_DEPARTMENT_OTHER): Payer: Self-pay | Admitting: Medical Oncology

## 2020-12-12 ENCOUNTER — Encounter (HOSPITAL_COMMUNITY): Payer: Self-pay

## 2020-12-12 ENCOUNTER — Ambulatory Visit (HOSPITAL_COMMUNITY): Payer: Self-pay

## 2020-12-12 LAB — PT/INR: INR: 1.7

## 2020-12-12 NOTE — Progress Notes (Signed)
Anticoagulation Encounter Note:    Surgical AVR plan   Warfarin start date: 01-06-2019   [facility] Staff: Earlie Raveling   DX : On-X AVR   INR Range: 1.5 - 2.0  Tab strength: warfarin 2mg    Contact 1: [phone] cell   Contact 2:   Lab: P[facility]/[place] - [facility]   Comments: Tylenol regularly    INR: 1.7    Current Dose: 6 mg M,Th/ 4 mg all other days    Clinical Outcomes       Positives:  INR In Range (INR good. pt called. )             Plan:  pt to continue current dosing and recheck INR in two weeks. Dosing verified by pt.     Next INR Date: 12/26/20    Leone Brand, BSN RN  Specialty Care Nurse, Anticoagulation  [facility]  Ph. [phone]  (ext. [phone])  Fax [phone]  [email or web address]        For additional information, see dosing calendar below.    Warfarin Therapy Instructions   February 2022 Details    Sun [name] Thu Fri Sat       1               2               3               4               5                 6               7               8               9               10    1.4   6 mg   See details      11      4 mg         12      4 mg           13      4 mg         14      6 mg         15      4 mg         16      4 mg         17      6 mg         18      4 mg         19      4 mg           20      4 mg         21      6 mg         22      4 mg         23      4 mg         24   1.7   6 mg   See details      25      4 mg  26      4 mg           27      4 mg         28      6 mg               Date Details   02/10 Last INR check     INR: 1.4      02/24 This INR check     INR: 1.7              Warfarin Therapy Instructions   March 2022 Details    Sun Mon Tue Wed Thu Fri Sat       1      4 mg         2      4 mg         3      6 mg         4      4 mg         5      4 mg           6      4 mg         7      6 mg         8      4 mg         9      4 mg         10      6 mg         11               12                 13               14               15               16                17               18               19                 20               21               22               23               24               25               26                 27               28               29               30                31  Date Details   No additional details    Date of next INR:  12/26/2020

## 2020-12-16 ENCOUNTER — Encounter (HOSPITAL_BASED_OUTPATIENT_CLINIC_OR_DEPARTMENT_OTHER): Payer: Self-pay | Admitting: Medical Oncology

## 2020-12-23 ENCOUNTER — Telehealth (HOSPITAL_BASED_OUTPATIENT_CLINIC_OR_DEPARTMENT_OTHER): Payer: Self-pay | Admitting: Orthopaedic Surgery

## 2020-12-23 NOTE — Telephone Encounter (Signed)
Returned call to patient that she left on my office voicemail.  Left message to call me at [phone]

## 2020-12-23 NOTE — Telephone Encounter (Signed)
Returned call to patient went over conservative treatments while she is waiting on her surgery.  Somewhat limited as she is on coumadin for her heart valve.  She was very understanding just frustrated with the situation

## 2020-12-26 ENCOUNTER — Encounter (HOSPITAL_COMMUNITY): Payer: Self-pay

## 2020-12-26 ENCOUNTER — Ambulatory Visit (HOSPITAL_COMMUNITY): Payer: Self-pay

## 2020-12-26 LAB — PT/INR: INR: 1.8

## 2020-12-26 NOTE — Progress Notes (Signed)
Anticoagulation Encounter Note:    Surgical AVR plan   Warfarin start date: 01-06-2019   [facility] Staff: Earlie Raveling   DX : On-X AVR   INR Range: 1.5 - 2.0  Tab strength: warfarin 2mg    Contact 1: [phone] cell   Contact 2:   Lab: POC/[place] - [facility]   Comments: Tylenol regularly    INR: 1.8    Current Dose: 6 mg M,Th/ 4 mg all other days    Clinical Outcomes     Positives:  INR In Range (INR good. pt called. )          Plan:  pt to continue current dosing and recheck INR in two weeks. Dosing verified by pt.     Next INR Date: 01/09/21    Leone Brand, BSN RN  Specialty Care Nurse, Anticoagulation  [facility]  Ph. [phone]  (ext. [phone])  Fax [phone]  [email or web address]        For additional information, see dosing calendar below.    Warfarin Therapy Instructions   February 2022 Details    Sun [name] Thu Fri Sat       1               2               3               4               5                 6               7               8               9               10               11               12                 13               14               15               16               17               18               19                 20               21               22               23               24    1.7   6 mg   See details      25      4 mg         26      4 mg  27      4 mg         28      6 mg               Date Details   02/24 Last INR check   INR: 1.7              Warfarin Therapy Instructions   March 2022 Details    Sun [name] Thu Fri Sat       1      4 mg         2      4 mg         3      6 mg         4      4 mg         5      4 mg           6      4 mg         7      6 mg         8      4 mg         9      4 mg         10   1.8   6 mg   See details      11      4 mg         12      4 mg           13      4 mg         14      6 mg         15      4 mg         16      4 mg         17      6 mg         18      4 mg         19      4 mg           20      4 mg          21      6 mg         22      4 mg         23      4 mg         24      6 mg         25               26                 27               28               29               30               31                   Date Details   03/10 This INR check   INR:  1.8       Date of next INR:  01/09/2021          Remigio Eisenmenger, MD,  Assistant Professor,   [facility].

## 2021-01-03 ENCOUNTER — Other Ambulatory Visit (HOSPITAL_COMMUNITY): Payer: Self-pay | Admitting: Cardiovascular Disease

## 2021-01-06 ENCOUNTER — Other Ambulatory Visit (HOSPITAL_COMMUNITY): Payer: Self-pay | Admitting: Cardiovascular Disease

## 2021-01-06 MED ORDER — WARFARIN 2 MG TABLET
6.0000 mg | ORAL_TABLET | Freq: Every evening | ORAL | 3 refills | Status: DC
Start: 2021-01-06 — End: 2021-07-11

## 2021-01-09 ENCOUNTER — Encounter (HOSPITAL_COMMUNITY): Payer: Self-pay

## 2021-01-09 ENCOUNTER — Ambulatory Visit (HOSPITAL_COMMUNITY): Payer: Self-pay

## 2021-01-09 LAB — PT/INR: INR: 1.8

## 2021-01-09 NOTE — Progress Notes (Signed)
Anticoagulation Encounter Note:    Surgical AVR plan   Warfarin start date: 01-06-2019   [facility] Staff: Earlie Raveling   DX : On-X AVR   INR Range: 1.5 - 2.0  Tab strength: warfarin 2mg    Contact 1: [phone] cell   Contact 2:   Lab: POC/[place] - [facility]   Comments: Tylenol regularly    INR: 1.8  Current Dose: 6 mg Mondays and Thursdays, 4 mg all other days.  Clinical Outcomes       Positives:  INR In Range             Plan: 6 mg Mondays and Thursdays, 4 mg all other days; recheck in 2 weeks. Confirmed dosing and testing with patient via MyChart message.    Next INR Date: 4.7.22    Erie Noe, RN SCN  [facility]        For additional information, see dosing calendar below.    Warfarin Therapy Instructions   March 2022 Details    Sun [name] Thu Fri Sat       1               2               3               4               5                 6               7               8               9               10    1.8   6 mg   See details      11      4 mg         12      4 mg           13      4 mg         14      6 mg         15      4 mg         16      4 mg         17      6 mg         18      4 mg         19      4 mg           20      4 mg         21      6 mg         22      4 mg         23      4 mg         24   1.8   6 mg   See details      25      4 mg         26      4 mg  27      4 mg         28      6 mg         29      4 mg         30      4 mg         31      6 mg            Date Details   03/10 Last INR check     INR: 1.8      03/24 This INR check     INR: 1.8              Warfarin Therapy Instructions   April 2022 Details    Sun Mon Tue Wed Thu Fri Sat          1      4 mg         2      4 mg           3      4 mg         4      6 mg         5      4 mg         6      4 mg         7      6 mg         8               9                 10               11               12               13               14               15               16                 17               18               19                20               21               22               23                 24               25               26               27               28                29  30                Date Details   No additional details    Date of next INR:  01/23/2021

## 2021-01-13 ENCOUNTER — Other Ambulatory Visit: Payer: Self-pay

## 2021-01-13 ENCOUNTER — Encounter (HOSPITAL_BASED_OUTPATIENT_CLINIC_OR_DEPARTMENT_OTHER): Payer: Self-pay | Admitting: Family

## 2021-01-13 ENCOUNTER — Ambulatory Visit: Payer: Medicare Other | Attending: Family | Admitting: Family

## 2021-01-13 VITALS — BP 128/78 | HR 78 | Ht 65.0 in | Wt 220.0 lb

## 2021-01-13 DIAGNOSIS — M25551 Pain in right hip: Secondary | ICD-10-CM | POA: Insufficient documentation

## 2021-01-13 DIAGNOSIS — Z79899 Other long term (current) drug therapy: Secondary | ICD-10-CM | POA: Insufficient documentation

## 2021-01-13 DIAGNOSIS — Z85828 Personal history of other malignant neoplasm of skin: Secondary | ICD-10-CM | POA: Insufficient documentation

## 2021-01-13 DIAGNOSIS — Z23 Encounter for immunization: Secondary | ICD-10-CM | POA: Insufficient documentation

## 2021-01-13 DIAGNOSIS — Z0000 Encounter for general adult medical examination without abnormal findings: Secondary | ICD-10-CM | POA: Insufficient documentation

## 2021-01-13 DIAGNOSIS — Z90710 Acquired absence of both cervix and uterus: Secondary | ICD-10-CM | POA: Insufficient documentation

## 2021-01-13 DIAGNOSIS — I10 Essential (primary) hypertension: Secondary | ICD-10-CM | POA: Insufficient documentation

## 2021-01-13 DIAGNOSIS — I4891 Unspecified atrial fibrillation: Secondary | ICD-10-CM | POA: Insufficient documentation

## 2021-01-13 DIAGNOSIS — Z952 Presence of prosthetic heart valve: Secondary | ICD-10-CM | POA: Insufficient documentation

## 2021-01-13 DIAGNOSIS — I9789 Other postprocedural complications and disorders of the circulatory system, not elsewhere classified: Secondary | ICD-10-CM | POA: Insufficient documentation

## 2021-01-13 DIAGNOSIS — Z7982 Long term (current) use of aspirin: Secondary | ICD-10-CM | POA: Insufficient documentation

## 2021-01-13 DIAGNOSIS — Z7901 Long term (current) use of anticoagulants: Secondary | ICD-10-CM | POA: Insufficient documentation

## 2021-01-13 DIAGNOSIS — G8929 Other chronic pain: Secondary | ICD-10-CM | POA: Insufficient documentation

## 2021-01-13 DIAGNOSIS — Z13220 Encounter for screening for lipoid disorders: Secondary | ICD-10-CM

## 2021-01-13 DIAGNOSIS — Z96642 Presence of left artificial hip joint: Secondary | ICD-10-CM | POA: Insufficient documentation

## 2021-01-13 DIAGNOSIS — Z87891 Personal history of nicotine dependence: Secondary | ICD-10-CM | POA: Insufficient documentation

## 2021-01-13 NOTE — Progress Notes (Addendum)
FAMILY MEDICINE, [facility]  [place] [address]  Operated by [facility]  Medicare Annual Wellness Visit    Name: [name] MRN:  [id number]   Date: 01/13/2021 Age: 65 y.o.       SUBJECTIVE:   [name] is a 65 y.o. female for presenting for Medicare Wellness exam.   I have reviewed and reconciled the medication list with the patient today.    Patient with chronic right hip pain. Scheduled for hip replacement on May 25th with Dr. Sharlene Motts. Patient reporting intermittent tingling in right foot/toes. She is currently in PT and would like to continue PT after hip replacement. Pain management with scheduled tylenol at this time.     Hypertension: treatment with HCTZ. BP at goal. Patient tolerating medication well.     AVR/ Afib: patient following with cardiology, Dr. Earlie Raveling. On Coumadin and follows with anticoagulation clinic. Denies s/s of bleeding.     Follows with dermatology, Dr Prince Rome, for skin concerns.     Comprehensive Health Assessment:  Patient verbal responses recorded in flowsheet   [place] 01/09/2020   Do you wish to complete this form? Yes   During the past 4 weeks, how would you rate your health in general? Good   During the past 4 weeks, how much difficulty have you had doing your usual activities inside and outside your home because of medical or emotional problems? Some difficulty   During the past 4 weeks, was someone available to help you if you needed and wanted help? Yes, some   In the past year, how many times have you gone to the emergency department or been admitted to a hospital for a health problem? 1 time   During the past 4 weeks, how much bodily pain have you typically had? Severe pain   Do you currently use opioid pain medications, take regularly or keep on hand for use as needed (medication such as hydrocodone, oxycodone, codeine or morphine)? No   Are you generally satisfied with your sleep? No   Do you have  enough money to buy things you need in everyday life, such as food, clothing, medicines, and housing? Yes, always   Can you get to places beyond walking distance without help?  (For example, can you drive your own car or travel alone on buses)? Yes   Do you fasten your seatbelt when you are in a car? Yes, usually   Do you exercise 20 minutes 3 or more days per week (such as walking, dancing, biking, mowing grass, swimming)? No, I usually don't exercise this much   How often do you eat food that is healthy (fruits, vegetables, lean meats) instead of unhealthy (sweets, fast food, junk food, fatty foods)? Most of the time   How often do you have trouble taking medicines the eay you are told to take them? I always take them as prescribed   Do you need any help communicating with your doctors and nurses because of vision or hearing problems? No   During the past 12 months, have you experienced confusion or memory loss that is happening more often or is getting worse? No   Are you now also seeing any specialist physician(s) (such as eye doctor, foot doctor, skin doctor)? Yes   If you are seeing a specialist for anything such as foot, eye, skin, etc.  please list their name(s) Lowry Ram and [facility]   How confident are you that you can control or  manage most of your health problems? Somewhat confident       I have reviewed and updated as appropriate the past medical, family and social history. 01/13/2021 as summarized below:  Past Medical History:   Diagnosis Date    Abdominal hernia 04/29/2020    hx of 3 repairs    Anemia     Aortic stenosis     Arthritis     Atrial fibrillation (CMS HCC) 04/29/2020    one episode  after AVR    Back problem 04/29/2020    LBP    Beta blocker prescribed for left ventricular systolic dysfunction     Blood in stool     positive cologuard.  Has not yet scheduled colonoscopy    Blood thinned due to long-term anticoagulant use 04/29/2020    coumadin    Cancer (CMS HCC)     skin cancer L  lip.  Needs MOHS to be scheduled    Chronic pain 04/29/2020    back and right hip    Colon polyp     Heart murmur 01/04/2019    AVR    High blood pressure     Motion sickness     Obesity     Rash 04/29/2020    scalp psoriasis and involves R ear    Valvular disease     aortic valve stenosis, following with CT surgery, echo 02/2016 and cath 03/2016    Wears glasses      Past Surgical History:   Procedure Laterality Date    Hx aortic valve replacement      Hx cesarean section  1984    Hx heart catheterization      Hx hernia repair  2004, 2006, 2008    Hx hip replacement Left 08/11/2016    Hx hysterectomy  2003    Hx oophorectomy       Current Outpatient Medications   Medication Sig    acetaminophen (TYLENOL) 500 mg Oral Tablet Take 650 mg by mouth Every night Taking 2 daily    amoxicillin (AMOXIL) 500 mg Oral Capsule Take 4 Capsules (2,000 mg total) by mouth Once, as needed (Prosthetic heart valve prophylaxis prior to dental, respiratory procedures.) for up to 1 dose    ascorbic acid, vitamin C, (VITAMIN C) 500 mg Oral Tablet Take 250 mg by mouth Once a day    aspirin 81 mg Oral Tablet, Chewable Take 1 Tablet (81 mg total) by mouth Once a day    Cholecalciferol, Vitamin D3, 50 mcg (2,000 unit) Oral Capsule Take 2,000 Units by mouth Once a day     clobetasoL (CORMAX) 0.05 % Solution APPLY SOLUTION TOPICALLY TO AFFECTED AREA TWICE DAILY    clobetasoL (TEMOVATE) 0.05 % Cream by Apply Topically route Twice daily    Fluocinolone-Shower Cap 0.01 % Oil Apply to scalp 1-2 times weekly at night.    fluticasone propionate (FLONASE) 50 mcg/actuation Nasal Spray, Suspension 1 Spray by Each Nostril route Once a day    hydroCHLOROthiazide (HYDRODIURIL) 25 mg Oral Tablet Take 1 Tablet (25 mg total) by mouth Once a day    loratadine (CLARITIN) 10 mg Oral Tablet Take 10 mg by mouth Once a day    metoprolol succinate (TOPROL-XL) 50 mg Oral Tablet Sustained Release 24 hr Take 1 Tablet (50 mg total) by mouth Once  a day    nystatin (NYSTOP) 100,000 unit/gram Powder by Apply Topically route Twice per day as needed    triamcinolone acetonide (ARISTOCORT A) 0.1 % Cream  Apply topically Twice daily (Patient not taking: No sig reported)    warfarin (COUMADIN) 2 mg Oral Tablet Take 3 Tablets (6 mg total) by mouth Every evening     Family Medical History:     Problem Relation (Age of Onset)    Atrial fibrillation Sister    Heart Disease Mother    Hypertension (High Blood Pressure) Father    Lung Cancer Father, Brother    Mitral valve prolapse Sister    Sudden Death no cause Mother          Social History     Socioeconomic History    Marital status: Single   Occupational History    Occupation: photography    Tobacco Use    Smoking status: Former Smoker     Packs/day: 1.00     Years: 35.00     Pack years: 35.00     Types: Cigarettes     Quit date: 2014     Years since quitting: 8.2    Smokeless tobacco: Never Used   Brewing technologist Use: Never used   Substance and Sexual Activity    Alcohol use: Yes     Comment: social only    Drug use: Never    Sexual activity: Not Currently   Other Topics Concern    Ability to Walk 1 Flight of Steps without SOB/CP Yes    Ability to Walk 2 Flight of Steps without SOB/CP No    Ability To Do Own ADL's Yes    Other Activity Level Yes     Comment: Cooking, cleaning, laundry, and carries groceries     Review of Systems: A comprehensive review of systems was negative.    Patient Active Problem List   Diagnosis    Essential hypertension    S/P hip replacement    Low back pain radiating to left leg    Severe aortic stenosis    S/P AVR (aortic valve replacement)    Osteoarthritis of right hip    Psoriasis of scalp    Hx of inverse psoriasis    Postoperative atrial fibrillation (CMS [facility])        List of Current Health Care Providers   Care Team     PCP     Name Type Specialty Phone Number    Derek Jack, [facility] Nurse Practitioner Family Practice-[facility] [phone]           Care Team     Name Type Specialty Phone Number    Joaquin Bend, MD Physician CARDIOLOGY [phone]    [place] Team, [facility] Not available Not available Not available    J[facility] Nurse Practitioner NURSE PRACTITIONER [phone]    [name], MD Physician ADULT RECONSTRUCTIVE ORTHOPAEDIC SURGERY [phone]                  Health Maintenance   Topic Date Due    Shingles Vaccine (1 of 2) Never done    Pneumococcal Vaccination, Age 19+ (51 of 1 - PPSV23) Never done    CT Lung Cancer Screening  02/13/2021    Depression Screening  01/13/2022    Annual Wellness Visit  01/13/2022    Mammography  07/12/2022    Adult Tdap-Td (2 - Td or Tdap) 11/08/2028    Osteoporosis screening  02/27/2030    Colonoscopy  05/14/2030    Hepatitis C screening  Completed    Influenza Vaccine  Completed    Covid-19 Vaccine  Completed  Medicare Wellness Assessment   Medicare initial or wellness physical in the last year?: Yes  Advance Directives (optional)   Does patient have a living will or MPOA: YES   Has patient provided [employer] with a copy?: YES   Advance directive information given to the patient today?: no      Activities of Daily Living   Do you need help with dressing, bathing, or walking?: No   Do you need help with shopping, housekeeping, medications, or finances?: No   Do you have rugs in hallways, broken steps, or poor lighting?: No   Do you have grab bars in your bathroom, non-slip strips in your tub, and hand rails on your stairs?: Yes   Urinary Incontinence Screen (Women >=65 only)   Do you ever leak urine when you don't want to?: YES   Cognitive Function Screen (1=Yes, 0=No)   What is you age?: Correct   What is the time to the nearest hour?: Correct   What is the year?: Correct   What is the name of this clinic?: Correct   Can the patient recognize two persons (the doctor, the nurse, home help, etc.)?: Correct   What is the date of your birth? (day and month  sufficient) : Correct   In what year did World War II end?: Correct   Who is the current president of the [place]?: Correct   Count from 20 down to 1?: Correct   What address did I give you earlier?: Correct   Total Score: 10       Hearing Screen   Have you noticed any hearing difficulties?: No  After whispering 9-1-6 how many numbers did the patient repeat correctly?: 3   Fall Risk Screen   Do you feel unsteady when standing or walking?: No  Do you worry about falling?: No  Have you fallen in the past year?: No   Vision Screen   Right Eye = 20:  (Patient will schedule exam.)       Depression Screen   Little interest or pleasure in doing things.: Several Days  Feeling down, depressed, or hopeless: Several Days  PHQ 2 Total: 2        OBJECTIVE:   BP 128/78    Pulse 78    Ht 1.651 m (5\' 5" )    Wt 99.8 kg (220 lb 0.3 oz)    SpO2 95%    BMI 36.61 kg/m        Other appropriate exam:  General General: moderately obese  HENT:ENT without erythema or injection, mucous membranes moist., TM's Clear.   Neck: no adenopathy  Lungs: clear to auscultation bilaterally.   Cardiovascular:    Heart regularly irregular rhythm and    Vascular   pulses 2+ throughout  Abdomen: soft, non-tender  Extremities: extremities normal, atraumatic, no cyanosis or edema  Skin: No rashes  Neurologic: grossly normal  Psychiatric: normal affect, behavior, memory, thought content, judgement, and speech.      Health Maintenance Due   Topic Date Due    Shingles Vaccine (1 of 2) Never done    Pneumococcal Vaccination, Age 46+ (71 of 1 - PPSV23) Never done      ASSESSMENT & PLAN:    2 ICD-10-CM    1. Encounter for Medicare annual wellness exam  Z00.00    2. Essential hypertension  I10    3. S/P AVR (aortic valve replacement)  Z95.2    4. Postoperative atrial fibrillation (CMS HCC)  I97.89  I48.91    5. Screening for hyperlipidemia  Z13.220 Lipid Panel   6. Need for pneumococcal vaccination  Z23 Pneumovax        Identified Risk Factors/ Recommended  Actions       Orders Placed This Encounter    Pneumovax    Lipid Panel            1. Medicare annual wellness exam     2. Hypertension, stable  - patient BP at goal. No changes to medication at this time.  - Patient received e-mail notification of [place] voluntary recall of HCTZ. Patient instructed to follow up with pharmacy to verify if this effected her medication.     3. AVR/Afib  - patient follows with cardiology for anticoagulation management    4. HCM  - screening lipid panel as above  - patient will receive pneumococcal vaccine today      The patient has been educated about risk factors and recommended preventive care. Written Prevention Plan completed/ updated and given to patient (see After Visit Summary).    Patient verbalized understanding and is agreeable with plan of care.     Return in about 6 months (around 07/16/2021) for HTN follow-up.    Derek Jack, APRN,FNP-BC  FAMILY MEDICINE, [facility]  [place] [address]  Phone: [phone]

## 2021-01-13 NOTE — Nursing Note (Signed)
Immunization administered     Name Date Dose VIS Date Route    Pneumovax 01/13/2021 0.5 mL 08/17/2018 Intramuscular    Site: Left deltoid    Given By: Kathrene Bongo, MA    Manufacturer: [employer]    Lot: [id number]    [place]: [id number]          Did patient have any prior reaction to any vaccine: no    The patient/ caregiver was given the opportunity to ask specific questions regarding vaccinations and review corresponding Vaccine Information Statements prior to administration of vaccines.     Daanish Copes, MA  01/13/2021, 10:38

## 2021-01-13 NOTE — Patient Instructions (Signed)
Medicare Preventive Services  Medicare coverage information Recommendation for YOU   Heart Disease and Diabetes   Lipid profile Every 5 years or more often if at risk for cardiovascular disease  Last Lipid Panel  (Last result in the past 2 years)      Cholesterol   HDL   LDL   Direct LDL   Triglycerides      01/22/20 0822 194   64   113  Comment: <100 mg/dL, Optimal  100-129 mg/dL, Near/Above Optimal  130-159 mg/dL, Borderline High  160-189 mg/dL, High  >=190 mg/dL, Very high     87           Diabetes Screening  yearly for those at risk for diabetes, 2 tests per year for those with prediabetes Last Glucose: 117    Diabetes Self Management Training or Medical Nutrition Therapy  For those with diabetes, up to 10 hrs initial training within a year, subsequent years up to 2 hrs of follow up training Optional for those with diabetes     Medical Nutrition Therapy Three hours of one-on-one counseling in first year, two hours in subsequent years Optional for those with diabetes, kidney disease   Intensive Behavioral Therapy for Obesity  Face-to-face counseling, first month every week, month 2-6 every other week, month 7-12 every month if continued progress is documented Optional for those with Body Mass Index 30 or higher  Your There is no height or weight on file to calculate BMI.   Tobacco Cessation (Quitting) Counseling   Two attempts per year, max 4 sessions per attempt, up to 8 per year, for those with tobacco-related health condition Optional for those that use tobacco   Cancer Screening   Colorectal screening   For anyone age 25 to 27 or any age if high risk:   Screening Colonoscopy every 10 yrs if low risk,  more frequent if higher risk  OR   Flexible  Sigmoidoscopy  every 5 yr OR   Fecal Occult Blood Testing yearly OR   Cologuard Stool DNA test once every 3 years OR   CT Colonography every 5 yrs    See your schedule below   Screening Pap Test Recommended every 3 years for all women age 78 to 36, or every five  years if combined with HPV test (routine screening not needed after total hysterectomy).  Medicare covers every 2 years, up to yearly if high risk.  Screening Pelvic Exam Medicare covers every 2 years, yearly if high risk or childbearing age with abnormal Pap in last 3 yrs. See your schedule below   Screening Mammogram   Recommended every 1-2 years for women age 11 to 90, and selectively recommended for women between 63-49 based on shared decisions about risk. Covered by Medicare up to every year for women age 20 or older See your schedule below   Lung Cancer Screening  Annual low dose computed tomography (LDCT scan) is recommended for those age 42-77 who smoked 30 pack-years and are current smokers or quit smoking within past 15 years (one pack-year= smoking one PPD for one year), after counseling by your doctor or nurse clinician about the possible benefits or harms See your schedule below   Vaccinations   Pneumococcal Vaccine Recommended routinely age 32+ with two separate vaccines one year apart (Prevnar then Pneumovax).  Recommended before age 70 if medical conditions increase risk  Seasonal Influenza Vaccine Once every flu season   Hepatitis B Vaccine 3 doses if risk (including anyone  with diabetes or liver disease)  Shingles Vaccine Once or twice at age 30 or older  Diphtheria Tetanus Pertussis Vaccine ONCE as adult, booster every 10 years     Immunization History   Administered Date(s) Administered    Covid-19 Vaccine,Pfizer-BioNTech,Purple Top,4yrs+ 12/13/2019, 01/03/2020, 08/26/2020    Influenza Vaccine, 6 month-adult 07/11/2019    Influenza Vaccine, 65+ 08/26/2020    Tetanus Toxoid/Diphtheria Toxoid/Acellular Pertussis Vaccine, Adsorbed (Tdap) 11/08/2018     Shingles vaccine and Diphtheria Tetanus Pertussis vaccines are available at pharmacies or local health department without a prescription.   Other Screening   Bone Densitometry   Every 24 months for anyone at risk, including postmenopausal        Glaucoma Screening   Yearly if in high risk group such as diabetes, family history, African American age 55+ or Hispanic American age 23+      Hepatitis C Screening recommended ONCE for those born between 1945-1965, or high risk for HCV infection       HIV Testing recommended routinely at least ONCE, covered every year for age 105 to 88 regardless of risk, and every year for age over 75 who ask for the test or higher risk  Yearly or up to 3 times in pregnancy         Abdominal Aortic Aneurysm Screening Ultrasound   Once between the age of 6-75 with a family history of AAA       Your Personalized Schedule for Preventive Tests   Health Maintenance: Pending and Last Completed       Date Due Completion Date    Shingles Vaccine (1 of 2) Never done ---    Pneumococcal Vaccination, Age 71+ (67 of 1 - PPSV23) Never done ---    Depression Screening 01/08/2021 01/09/2020    CT Lung Cancer Screening 02/13/2021 02/14/2020    Annual Wellness Visit 01/13/2022 01/13/2021    Mammography 07/12/2022 07/12/2020    Adult Tdap-Td (2 - Td or Tdap) 11/08/2028 11/08/2018    Osteoporosis screening 02/27/2030 02/28/2020    Colonoscopy 05/14/2030 05/14/2020

## 2021-01-17 ENCOUNTER — Other Ambulatory Visit (HOSPITAL_COMMUNITY): Payer: Self-pay | Admitting: Orthopaedic Surgery

## 2021-01-17 DIAGNOSIS — M1611 Unilateral primary osteoarthritis, right hip: Secondary | ICD-10-CM

## 2021-01-20 ENCOUNTER — Other Ambulatory Visit: Payer: Self-pay

## 2021-01-20 ENCOUNTER — Ambulatory Visit: Payer: Medicare Other | Attending: Family | Admitting: Gynecology

## 2021-01-20 DIAGNOSIS — Z79899 Other long term (current) drug therapy: Secondary | ICD-10-CM | POA: Insufficient documentation

## 2021-01-20 DIAGNOSIS — Z13220 Encounter for screening for lipoid disorders: Secondary | ICD-10-CM | POA: Insufficient documentation

## 2021-01-20 LAB — LIPID PANEL
CHOL/HDL RATIO: 2.8
CHOLESTEROL: 199 mg/dL (ref 100–200)
HDL CHOL: 71 mg/dL (ref >=50)
LDL CALC: 109 mg/dL — ABNORMAL HIGH (ref <100)
NON-HDL: 128 mg/dL (ref <=190)
TRIGLYCERIDES: 95 mg/dL (ref <150)
VLDL CALC: 19 mg/dL (ref <30)

## 2021-01-23 ENCOUNTER — Ambulatory Visit (HOSPITAL_COMMUNITY): Payer: Self-pay

## 2021-01-23 ENCOUNTER — Encounter (HOSPITAL_COMMUNITY): Payer: Self-pay

## 2021-01-23 LAB — PT/INR: INR: 2

## 2021-01-23 NOTE — Progress Notes (Signed)
Anticoagulation Encounter Note:    Surgical AVR plan   Warfarin start date: 01-06-2019   [facility] Staff: Earlie Raveling   DX : On-X AVR   INR Range: 1.5 - 2.0  Tab strength: warfarin 2mg    Contact 1: [phone] cell   Contact 2:   Lab: [facility]   Comments: Tylenol regularly    INR: 2.0    Current Dose: 6 mg M,Th/ 4 mg all other days    Clinical Outcomes       Positives:  INR In Range (INR high normal. pt called. )          Patient Findings       Negatives:  Change in health, Change in medications, Change in diet/appetite          Plan: pt instructed to take warfarin 4 mg once tonight then resume current dosing and recheck INR in two weeks. Dosing verified by pt.     Next INR Date: 02/06/21    Leone Brand, BSN RN  Specialty Care Nurse, Anticoagulation  [facility]  Ph. [phone]  (ext. [phone])  Fax [phone]  [email or web address]        For additional information, see dosing calendar below.    Warfarin Therapy Instructions   March 2022 Details    Sun [name] Thu Fri Sat       1               2               3               4               5                 6               7               8               9               10               11               12                 13               14               15               16               17               18               19                 20               21               22               23               24    1.8   6 mg   See  details      25      4 mg         26      4 mg           27      4 mg         28      6 mg         29      4 mg         30      4 mg         31      6 mg            Date Details   03/24 Last INR check     INR: 1.8              Warfarin Therapy Instructions   April 2022 Details    Sun Mon Tue Wed Thu Fri Sat          1      4 mg         2      4 mg           3      4 mg         4      6 mg         5      4 mg         6      4 mg         7   2.0   4 mg   See details      8      4 mg         9      4 mg            10      4 mg         11      6 mg         12      4 mg         13      4 mg         14      6 mg         15      4 mg         16      4 mg           17      4 mg         18      6 mg         19      4 mg         20      4 mg         21      6 mg         22               23                 24               25               26                27  28               29               30                 Date Details   04/07 This INR check     INR: 2.0       Date of next INR:  02/06/2021

## 2021-02-06 ENCOUNTER — Ambulatory Visit (HOSPITAL_COMMUNITY): Payer: Self-pay

## 2021-02-06 ENCOUNTER — Encounter (HOSPITAL_COMMUNITY): Payer: Self-pay

## 2021-02-06 LAB — PT/INR: INR: 2.1

## 2021-02-07 NOTE — Progress Notes (Signed)
Anticoagulation Encounter Note:    Surgical AVR plan   Warfarin start date: 01-06-2019   [facility] Staff: Earlie Raveling   DX : On-X AVR   INR Range: 1.5 - 2.0  Tab strength: warfarin 2mg    Contact 1: [phone] cell   Contact 2:   Lab: [facility] [facility]   Comments: Tylenol regularly    INR: 2.1    Current Dose: 6 mg M,Th/ 4 mg all other days    Clinical Outcomes     Positives:  INR Above Goal (INR slightly elevated. pt called. )        Patient Findings     Negatives:  Signs/symptoms of bleeding, Change in health, Change in medications, Change in diet/appetite        Plan: pt instructed to start warfarin 6 mg M/ 4 mg all other days and recheck INR in two weeks. Dosing verified by pt.     Next INR Date: 02/20/21    Leone Brand, BSN RN  Specialty Care Nurse, Anticoagulation  [facility]  Ph. [phone]  (ext. [phone])  Fax [phone]  [email or web address]        For additional information, see dosing calendar below.    Warfarin Therapy Instructions   April 2022 Details    Sun [name] Thu Fri Sat          1               2                 3               4               5               6               7    2.0   4 mg   See details      8      4 mg         9      4 mg           10      4 mg         11      6 mg         12      4 mg         13      4 mg         14      6 mg         15      4 mg         16      4 mg           17      4 mg         18      6 mg         19      4 mg         20      4 mg         21   2.1   4 mg   See details      22      4 mg         23      4 mg  24      4 mg         25      6 mg         26      4 mg         27      4 mg         28      4 mg         29      4 mg         30      4 mg          Date Details   04/07 Last INR check   INR: 2.0      04/21 This INR check   INR: 2.1              Warfarin Therapy Instructions   May 2022 Details    Sun Mon Tue Wed Thu Fri Sat     1      4 mg         2      6 mg         3      4 mg         4      4 mg         5      6 mg          6               7                 8               9               10               11               12               13               14                 15               16               17               18               19               20               21                 22               23               24               25               26               27                28  _0 Date Details   No additional details    Date of next INR:  02/20/2021

## 2021-02-11 ENCOUNTER — Encounter (INDEPENDENT_AMBULATORY_CARE_PROVIDER_SITE_OTHER): Payer: Self-pay

## 2021-02-14 ENCOUNTER — Encounter (HOSPITAL_BASED_OUTPATIENT_CLINIC_OR_DEPARTMENT_OTHER): Payer: Self-pay

## 2021-02-17 ENCOUNTER — Ambulatory Visit (HOSPITAL_BASED_OUTPATIENT_CLINIC_OR_DEPARTMENT_OTHER): Payer: Self-pay

## 2021-02-18 ENCOUNTER — Other Ambulatory Visit (HOSPITAL_BASED_OUTPATIENT_CLINIC_OR_DEPARTMENT_OTHER): Payer: Self-pay | Admitting: Physician Assistant

## 2021-02-18 DIAGNOSIS — M1611 Unilateral primary osteoarthritis, right hip: Secondary | ICD-10-CM

## 2021-02-19 ENCOUNTER — Encounter (HOSPITAL_BASED_OUTPATIENT_CLINIC_OR_DEPARTMENT_OTHER): Payer: Self-pay | Admitting: Medical Oncology

## 2021-02-19 ENCOUNTER — Encounter (HOSPITAL_COMMUNITY): Payer: Self-pay

## 2021-02-19 ENCOUNTER — Ambulatory Visit (HOSPITAL_BASED_OUTPATIENT_CLINIC_OR_DEPARTMENT_OTHER)
Admission: RE | Admit: 2021-02-19 | Discharge: 2021-02-19 | Disposition: A | Discharge status: Home / Self Care | Payer: Medicare Other | Source: Ambulatory Visit

## 2021-02-19 ENCOUNTER — Other Ambulatory Visit: Payer: Self-pay

## 2021-02-19 ENCOUNTER — Ambulatory Visit (HOSPITAL_BASED_OUTPATIENT_CLINIC_OR_DEPARTMENT_OTHER): Payer: Medicare Other | Admitting: Medical Oncology

## 2021-02-19 ENCOUNTER — Ambulatory Visit (HOSPITAL_BASED_OUTPATIENT_CLINIC_OR_DEPARTMENT_OTHER): Payer: Medicare Other

## 2021-02-19 ENCOUNTER — Ambulatory Visit: Payer: Medicare Other | Attending: Orthopaedic Surgery | Admitting: Orthopaedic Surgery

## 2021-02-19 ENCOUNTER — Encounter (HOSPITAL_BASED_OUTPATIENT_CLINIC_OR_DEPARTMENT_OTHER): Payer: Self-pay | Admitting: Orthopaedic Surgery

## 2021-02-19 ENCOUNTER — Encounter (HOSPITAL_BASED_OUTPATIENT_CLINIC_OR_DEPARTMENT_OTHER): Payer: Self-pay

## 2021-02-19 VITALS — BP 141/72 | HR 67 | Temp 96.4°F | Ht 65.32 in | Wt 222.2 lb

## 2021-02-19 DIAGNOSIS — Z01818 Encounter for other preprocedural examination: Secondary | ICD-10-CM

## 2021-02-19 DIAGNOSIS — I350 Nonrheumatic aortic (valve) stenosis: Secondary | ICD-10-CM

## 2021-02-19 DIAGNOSIS — I10 Essential (primary) hypertension: Secondary | ICD-10-CM | POA: Insufficient documentation

## 2021-02-19 DIAGNOSIS — Z122 Encounter for screening for malignant neoplasm of respiratory organs: Secondary | ICD-10-CM | POA: Insufficient documentation

## 2021-02-19 DIAGNOSIS — Z952 Presence of prosthetic heart valve: Secondary | ICD-10-CM | POA: Insufficient documentation

## 2021-02-19 DIAGNOSIS — L409 Psoriasis, unspecified: Secondary | ICD-10-CM | POA: Insufficient documentation

## 2021-02-19 DIAGNOSIS — Z87891 Personal history of nicotine dependence: Secondary | ICD-10-CM

## 2021-02-19 DIAGNOSIS — I4891 Unspecified atrial fibrillation: Secondary | ICD-10-CM

## 2021-02-19 DIAGNOSIS — I9789 Other postprocedural complications and disorders of the circulatory system, not elsewhere classified: Secondary | ICD-10-CM

## 2021-02-19 DIAGNOSIS — M1611 Unilateral primary osteoarthritis, right hip: Secondary | ICD-10-CM

## 2021-02-19 DIAGNOSIS — E871 Hypo-osmolality and hyponatremia: Secondary | ICD-10-CM | POA: Insufficient documentation

## 2021-02-19 LAB — CBC WITH DIFF
BASOPHIL #: <0.1 10*3/uL (ref <=0.20)
BASOPHIL %: 0 %
EOSINOPHIL #: <0.1 10*3/uL (ref <=0.50)
EOSINOPHIL %: 2 %
HCT: 41.1 % (ref 34.8–46.0)
HGB: 13.3 g/dL (ref 11.5–16.0)
IMMATURE GRANULOCYTE #: <0.1 10*3/uL (ref <0.10)
IMMATURE GRANULOCYTE %: 0 % (ref 0–1)
LYMPHOCYTE #: 1.8 10*3/uL (ref 1.00–4.80)
LYMPHOCYTE %: 31 %
MCH: 29.6 pg (ref 26.0–32.0)
MCHC: 32.4 g/dL (ref 31.0–35.5)
MCV: 91.3 fL (ref 78.0–100.0)
MONOCYTE #: 0.49 10*3/uL (ref 0.20–1.10)
MONOCYTE %: 8 %
MPV: 8.9 fL (ref 8.7–12.5)
NEUTROPHIL #: 3.49 10*3/uL (ref 1.50–7.70)
NEUTROPHIL %: 59 %
PLATELETS: 265 10*3/uL (ref 150–400)
RBC: 4.5 10*6/uL (ref 3.85–5.22)
RDW-CV: 12.1 % (ref 11.5–15.5)
WBC: 5.9 10*3/uL (ref 3.7–11.0)

## 2021-02-19 LAB — COMPREHENSIVE METABOLIC PANEL, NON-FASTING
ALBUMIN: 4.2 g/dL (ref 3.4–4.8)
ALKALINE PHOSPHATASE: 67 U/L (ref 55–145)
ALT (SGPT): 12 U/L (ref 8–22)
ANION GAP: 10 mmol/L (ref 4–13)
AST (SGOT): 19 U/L (ref 8–45)
BILIRUBIN TOTAL: 0.6 mg/dL (ref 0.3–1.3)
BUN/CREA RATIO: 25 — ABNORMAL HIGH (ref 6–22)
BUN: 18 mg/dL (ref 8–25)
CALCIUM: 9.4 mg/dL (ref 8.8–10.2)
CHLORIDE: 95 mmol/L — ABNORMAL LOW (ref 96–111)
CO2 TOTAL: 28 mmol/L (ref 23–31)
CREATININE: 0.72 mg/dL (ref 0.60–1.05)
ESTIMATED GFR: 88 mL/min/BSA (ref >=60)
GLUCOSE: 76 mg/dL (ref 65–125)
POTASSIUM: 3.7 mmol/L (ref 3.5–5.1)
PROTEIN TOTAL: 7.4 g/dL (ref 6.0–8.0)
SODIUM: 133 mmol/L — ABNORMAL LOW (ref 136–145)

## 2021-02-19 LAB — ECG 12-LEAD (UTC ONLY)
Atrial Rate: 65 {beats}/min
Calculated P Axis: 47 degrees
Calculated R Axis: 25 degrees
Calculated T Axis: 57 degrees
PR Interval: 144 ms
QRS Duration: 94 ms
QT Interval: 432 ms
QTC Calculation: 449 ms
Ventricular rate: 65 {beats}/min

## 2021-02-19 LAB — TYPE AND SCREEN: ABO/RH(D): O POS

## 2021-02-19 NOTE — Progress Notes (Addendum)
PRE-OP ORTHOPAEDIC CM ASSESSMENT    Care Management Pre-admission Assessment - Met w patient at [facility]. She did not attend the Joint Pre-op Class.    Home Set-up - The patient lives w her daughter, son-in-law and granddaughters. She stays in the furnished basement where there is a walk-in shower. There are 0 STE.     Equipment - Cane, FWW, SC    Planned Therapy - Home w assist    RAPT - 8    Planned DVT Prophylaxis - Resume warfarin and plavix    Planned Medicines - celecoxib, ondansetron, oxycodone. Per patient, she cannot take celecoxib. Per [facility], Member ID [id number] ([employer]), ondansetron and oxycodone are covered, no PA required.    Pharmacy - [facility]    Transport/Assist - family    Surgeon/Surgery/Date - Dr Sharlene Motts R [facility] 03/13/21

## 2021-02-19 NOTE — H&P (Addendum)
[facility]  [facility]edic Medical Optimization Program  History and Physical     CHIEF COMPLAINT  Pre-Operative History and Physical Examination for R THA (anterior approach, MAKO) requested by Sharlene Motts, MD.     ASSESSMENT & PLAN  Problem List Items Addressed This Visit     Osteoarthritis of right hip  - Patient has failed conservative therapy, operative management is planned.  - Pain management as per surgical team.  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 Earlie Raveling, last cardiology visit 11/2020  - TTE 12/2019 demonstrated no AS, mild AI, mean gradient of 9 mmHg and max gradient of 16 mmHg, which is a normal gradient for the valve type and size. EF was 63.1%.  - She is on Coumadin, which is managed by Dr. Earlie Raveling and the [place] [facility]. Her current dose is 6 mg on Monday and Thursday, 4 mg all other days of the week.   - She monitors INR at home. Goal INR with her valve is 1.5-2.0  - Reviewed most recent cardiology clearance note, ok to hold Coumadin 5 days prior to surgery. They recommend continuing aspirin through the perioperative period if possible. I sent a message to cardiology today to discuss further, as we typically hold for 7 days pre-op, however, if this is required for her valve replacement and she cannot stop due to risk valve complications, will continue aspirin knowing that she is at an elevated risk of bleeding perioperatively.  Will update Dr. Sharlene Motts and patient once I discuss with cardiology.     ADDENDUM, 02/27/21  - Per cardiology team, ok to hold aspirin for 7 days prior to surgery and resume as soon as safely possible post-operatively. Patient notified  via MyChart to hold aspirin 7 days prior to surgery.       Psoriasis of scalp  Hx of inverse psoriasis  - currently well-controlled, continue topical triamcinolone PRN  - currently no psoriasis over planned surgical site. Patient aware that surgical site must be clear of any rashes or lesions.      Obesity (BMI 35.0-39.9)  - Body mass index is 36.62 kg/m.      Postoperative atrial fibrillation (CMS HCC)  - Patient developed Afib post-operatively after AVR, successfully converted to NSR with amiodarone  - She is on Coumadin for On-x mechanical aortic valve replacement (holding 5 days prior to surgery)  - Patient denies any further episodes of atrial fibrillation, though she did have a few very brief episodes of fluttering last month. Repeat EKG ordered today.  - Patient denies personal history of stroke  - CHA2Ds2-VASc 3  - Please place on telemetry post-operatively with any concerning si/sx  - Continue metoprolol      Other Visit Diagnoses         Preop examination    -  Primary  - Pre-operative labs and EKG ordered, pending.  - Patient is at risk for excess bleeding if aspirin is continued throughout the perioperative period.   - Patient is at acceptable risk of perioperative complications for the planned moderate risk surgery pending lab results and EKG.  Relevant Orders    COMPREHENSIVE METABOLIC PANEL, NON-FASTING    CBC/DIFF    TYPE AND SCREEN    ECG 12-LEAD (UTC ONLY)       Hyponatremia      - Likely 2/2 HCTZ use  - Has been > 130 and stable  - Recheck CMP  - Should her sodium level drop below 130, recommend obtaining urine and serum osmolality and urine sodium. Consider fluid restriction if hyponatremia worsens.         The patient has been provided with the follow instructions regarding medication holds prior to surgery:    * Will give patient medication hold instructions after discussion of aspirin with cardiology.     The patient was encouraged to call with questions or a change in health  status.    Operative Reference    Pain contract on file No         Patient history of DVT:   No    Last doppler date/result:   N/A         Recommended DVT ppx Resume home warfarin and aspirin 81 mg     Prophylactic antibiotics   Ancef and Vancomycin (reports personal hx MRSA)     Patient history of   No thrombotic events in the last 6 months     Sleep Apnea   No     Current Tobacco Use:    No     Current Drug Use:    No     Current Alcohol Use:    No     Fast Track   No   Allergy to the following: latex, metal/nickel, iodine, acrylic   No     Open wounds/rashes   No     Research patient   Not discussed.     Hx chemo/radiation   No     Hx MRSA   Yes       Personal or family history of anesthesia complications?  Personal:  None apart a reaction (altered mental status) to stadol in the 1980's    Family:  No       Most recent joint injections   None   Vulnerable population patient or hospitalization within 3 months?   No     Dental issues that need addressed prior to surgery:        No     Family history of blood clots or bleeding disorders:     No     History of pulmonary disorders:      No     History of MI/CVA/cardiac stents/cardiac surgeries:      Hx aortic valve replacement 12/2018       Patient has been evaluated by [facility] Program.  The following clearances have been obtained: Cardiology and [facility]    HISTORY OF PRESENT ILLNESS:   [name] is a 65 y.o. female that presents with need for pre-operative history and physical.  She has a history of OA right hip requiring R THA.    Patient was last seen in [place] [facility] on 11/15/2020 by myself. Since this time, the patient reports the following changes in health and/or medications:   - Reports she felt a brief flutter in her chest approximately 1 month ago. Lasted for a few seconds and occurred a few times day that. She reports it has not occurred again since this time. She denies associated chest pain, shortness of breath, dizziness,  lightheadedness, or other concerning symptoms.  Denies testing positive for COVID-19 in the last 90 days. + COVID vaccine and booster.     Pt denies recent fever, chills, changes in bowel/bladder habits, dysuria, CP, palpitations,  dizziness.  Pt denies syncopal or presyncopal symptoms as well as SOB, dyspnea, wheezing, orthopnea or PND.     Pt is able to ambulate 4 blocks as well as a flight of stairs without experiencing SOB.     Medications  Past Medical History:   Diagnosis Date   . Abdominal hernia 04/29/2020    hx of 3 repairs   . Anemia    . Aortic stenosis    . Arthritis    . Atrial fibrillation (CMS HCC) 04/29/2020    one episode  after AVR   . Back problem 04/29/2020    LBP   . Beta blocker prescribed for left ventricular systolic dysfunction    . Blood in stool     positive cologuard.  Has not yet scheduled colonoscopy   . Blood thinned due to long-term anticoagulant use 04/29/2020    coumadin   . Cancer (CMS HCC)     skin cancer L lip.  Needs MOHS to be scheduled   . Chronic pain 04/29/2020    back and right hip   . Colon polyp    . Heart murmur 01/04/2019    AVR   . High blood pressure    . Motion sickness    . Obesity    . Rash 04/29/2020    scalp psoriasis and involves R ear   . Valvular disease     aortic valve stenosis, following with CT surgery, echo 02/2016 and cath 03/2016   . Wears glasses      Past Surgical History:   Procedure Laterality Date   . HX AORTIC VALVE REPLACEMENT     . HX CESAREAN SECTION  1984   . HX HEART CATHETERIZATION      x2 with no intervention per pt report   . HX HERNIA REPAIR  2004, 2006, 2008    x3   . HX HIP REPLACEMENT Left 08/11/2016   . HX HYSTERECTOMY  2003   . HX OOPHORECTOMY      1 ovary     Allergies   Allergen Reactions   . Iv Contrast Anaphylaxis   . Oregano      Hives     . Stadol [Butorphanol Tartrate] Mental Status Effect   . Tomato Hives/ Urticaria     Outpatient Medications Marked as Taking for the 02/19/21 encounter (Office Visit) with Karle Plumber,  APRN,NP-C   Medication Sig   . acetaminophen (TYLENOL) 500 mg Oral Tablet Take 650 mg by mouth Every night Taking 2 daily   . amoxicillin (AMOXIL) 500 mg Oral Capsule Take 4 Capsules (2,000 mg total) by mouth Once, as needed (Prosthetic heart valve prophylaxis prior to dental, respiratory procedures.) for up to 1 dose   . ascorbic acid, vitamin C, (VITAMIN C) 500 mg Oral Tablet Take 250 mg by mouth Once a day   . aspirin 81 mg Oral Tablet, Chewable Take 1 Tablet (81 mg total) by mouth Once a day   . Cholecalciferol, Vitamin D3, 50 mcg (2,000 unit) Oral Capsule Take 2,000 Units by mouth Once a day    . clobetasoL (CORMAX) 0.05 % Solution APPLY SOLUTION TOPICALLY TO AFFECTED AREA TWICE DAILY   . clobetasoL (TEMOVATE) 0.05 % Cream by Apply Topically route Twice daily   . Fluocinolone-Shower Cap 0.01 % Oil Apply to  scalp 1-2 times weekly at night.   . hydroCHLOROthiazide (HYDRODIURIL) 25 mg Oral Tablet Take 1 Tablet (25 mg total) by mouth Once a day   . loratadine (CLARITIN) 10 mg Oral Tablet Take 10 mg by mouth Once a day   . metoprolol succinate (TOPROL-XL) 50 mg Oral Tablet Sustained Release 24 hr Take 1 Tablet (50 mg total) by mouth Once a day   . nystatin (NYSTOP) 100,000 unit/gram Powder by Apply Topically route Twice per day as needed   . warfarin (COUMADIN) 2 mg Oral Tablet Take 3 Tablets (6 mg total) by mouth Every evening     Social History     Tobacco Use   . Smoking status: Former Smoker     Packs/day: 1.00     Years: 42.00     Pack years: 42.00     Types: Cigarettes     Start date: 72     Quit date: 2014     Years since quitting: 8.3   . Smokeless tobacco: Never Used   Vaping Use   . Vaping Use: Never used   Substance Use Topics   . Alcohol use: Yes     Comment: social only   . Drug use: Never     Family Medical History:     Problem Relation (Age of Onset)    Atrial fibrillation Sister    Heart Disease Mother    Hypertension (High Blood Pressure) Father    Lung Cancer Father, Brother    Mitral valve  prolapse Sister    Sudden Death no cause Mother        ROS  Review of Systems   Constitutional: Negative for chills and fever.   HENT: Negative for dental problem.    Respiratory: Negative for chest tightness, shortness of breath and wheezing.    Cardiovascular: Positive for leg swelling (intermittent). Negative for chest pain and palpitations.   Gastrointestinal: Negative for constipation and diarrhea.   Genitourinary: Negative for difficulty urinating and dysuria.   Musculoskeletal: Positive for arthralgias.   Skin: Negative for rash and wound.   Neurological: Negative for dizziness, light-headedness and headaches.       Exam  BP (!) 141/72   Pulse 67   Temp 35.8 C (96.4 F) (Thermal Scan)   Ht 1.659 m (5' 5.32")   Wt 101 kg (222 lb 3.6 oz)   BMI 36.62 kg/m     Body mass index is 36.62 kg/m.    Physical Exam  Vitals reviewed.   Constitutional:       General: She is not in acute distress.     Appearance: Normal appearance. She is obese. She is not ill-appearing.   HENT:      Head: Normocephalic and atraumatic.      Mouth/Throat:      Mouth: Mucous membranes are moist.      Pharynx: No oropharyngeal exudate or posterior oropharyngeal erythema.      Comments: There is no evidence of broken teeth or significant decay.  Eyes:      General: No scleral icterus.     Pupils: Pupils are equal, round, and reactive to light.   Neck:      Vascular: No carotid bruit.   Cardiovascular:      Rate and Rhythm: Normal rate and regular rhythm.      Heart sounds: Normal heart sounds.      Comments: Mechanical valve click  Pulmonary:      Effort: Pulmonary effort is normal. No  respiratory distress.      Breath sounds: Normal breath sounds. No wheezing.   Abdominal:      General: Bowel sounds are normal. There is no distension.      Tenderness: There is no abdominal tenderness.   Musculoskeletal:      Right lower leg: Edema (trace) present.      Left lower leg: Edema (trace) present.   Lymphadenopathy:      Cervical: No cervical  adenopathy.   Skin:     General: Skin is warm and dry.      Comments: Intact over right hip   Neurological:      Mental Status: She is alert and oriented to person, place, and time.   Psychiatric:         Mood and Affect: Mood normal.         Behavior: Behavior normal.         Thought Content: Thought content normal.         Judgment: Judgment normal.           Labs:  Diagnostics:  BMP   Lab Results   Component Value Date    SODIUM 131 (L) 12/03/2020    POTASSIUM 3.6 12/03/2020    CHLORIDE 97 12/03/2020    CO2 30 12/03/2020    BUN 23 12/03/2020    CREATININE 0.71 12/03/2020    GLUCOSENF 117 12/03/2020    ANIONGAP 4 12/03/2020    BUNCRRATIO 32 (H) 12/03/2020    GFR 89 12/03/2020        LFTs   Lab Results   Component Value Date    AST 19 12/03/2020    ALT 13 12/03/2020    ALKPHOS 81 12/03/2020    TOTBILIRUBIN 0.3 12/03/2020    BILIRUBINCON 0.3 (H) 01/08/2019    TOTALPROTEIN 7.6 12/03/2020    ALBUMIN 4.1 12/03/2020   No results for input(s): PREALBUMIN, LIPASE, UROBILINOGEN, GAMMAGT, LDH, AMYLASE, AMMONIA in the last 72 hours.     CBC   Lab Results   Component Value Date    WBC 5.9 12/03/2020    HGB 13.8 12/03/2020    HCT 42.6 12/03/2020    PLTCNT 260 12/03/2020      Diff   Lab Results   Component Value Date    PMNS 62 12/03/2020    LYMPHOCYTES 28 08/04/2016    MONOCYTES 7 12/03/2020    EOSINOPHIL 1 08/04/2016    BASOPHILS 1 12/03/2020    BASOPHILS <0.10 12/03/2020    PMNABS 3.66 12/03/2020    LYMPHSABS 1.68 12/03/2020    EOSABS <0.10 12/03/2020    MONOSABS 0.43 12/03/2020        Coagulation Studies   Lab Results   Component Value Date    PROTHROMTME 19.3 (H) 11/15/2020    INR 2.1 02/06/2021    APTT 26.4 01/10/2019         Inflammatory markers   Lab Results   Component Value Date    AESR 10 11/11/2016    CREAPROINFLA 3.4 11/11/2016          Revised Goldman Cardiac Risk Index:  Of the six independent predictors of major cardiac complications, patient exhibits the following:  None           Total Points: 0    Rate of  cardiac death, nonfatal myocardial infarction, and nonfatal cardiac arrest  No risk factors - 0.4 percent (95%CI 0.1-0.8 percent)  This represents the percentage risk of major cardiac complications (cardiac death, non-fatal  MI, non-fatal cardiac arrest, post-operative cardiogenic pulmonary edema, and/or complete heart block).    ASA: ASA 3 - Patient with moderate systemic disease with functional limitations    METs: > 4 METs - Climbing a flight of stairs    NSQIP:  NSQIP Risk Calculator    Sharlene Motts, MD comments related to surgery may be added after this signature for additional information and surgical plans.    Patient was seen independently by Karle Plumber, FNP-C with physician available for consultation.    Karle Plumber, APRN, FNP-C 02/19/2021  [facility] Program  Department of [place]  [place]

## 2021-02-19 NOTE — Ancillary Notes (Signed)
Patient arrived for their low dose ct lung cancer screening. Written smoking cessation materials provided to patient during this visit.       Aura Camps, RT (R) 02/19/2021, 12:08

## 2021-02-19 NOTE — Progress Notes (Signed)
[facility]  Patient presents today for pre op H & P for planned Right robotically assisted total Hip replacement anterior  Date of surgery: 03/13/21.  The patient has been cleared for surgery by our medical optimization specialists     Brief Exam:  BP (!) 141/72   Pulse 67   Temp 35.8 C (96.4 F) (Thermal Scan)   Ht 1.659 m (5' 5.32")   Wt 101 kg (222 lb 3.6 oz)   BMI 36.62 kg/m        Intended surgical site is without blemish  Pedal pulses are Normal   Sensation is intact  Range of motion right hip: 0-85, 10 IR. +DF/PF .    Impression  No diagnosis found.     Plan  right robotically assisted total Hip replacement anterior  Implants: Stryker Accolade 2 and Trident 2  Bearing surface chosen after conferring with patient: ceramic on poly  Implant fixation chosen after conferring with patient: cementless fixation  Anesthesia discussed with patient: Spinal.  DVT prophylaxis: aspirin and coumadin (home meds)  Prophylactic antibiotic: Ancef  And Vanco.  Tranexamic Acid: Parenterally  Hospital length of stay:   one night.  Blood products: Accept.  The patient will Accept photographs and video at the time of surgery.  NOTES: [facility] CT and xray today.  [facility] discussing with cards about holding ASA.    The pros and cons of the proposed surgery were reviewed at great length.  Specifically we described the risks of surgery to include the risk of infection, dislocation, bone fracture, limb length inequality, surgical site numbness, nerve or vascular injury, deep vein thrombosis, chronic pain, the need for future revisions, and even the risk of death as a result of things we could not possibly predict like a heart attack, stroke, pneumonia, sepsis and pulmonary embolus. We explained that infection is the number one most common serious post-operative complication that can be so severe as to necessitate multiple surgeries, permanent removal of the prosthesis resulting in a shortened painful limb,  or even amputation.  We explained that these complications are relatively rare and sometimes devastating.  We explained that we are obligated to describe the risks and alternative treatments so that she can make an informed decision.  The consent was signed and witnessed and we believe this to be proper informed consent.  Preoperative instructions were reviewed.  See "patient instructions" in the AVS.    The patient was encouraged to call with questions or a change in health status.      [name], MD  02/19/2021, 12:34       Cc    Self, Referral  No address on file    Derek Jack, APRN,FNP-BC  [address]  [place] [id number]

## 2021-02-19 NOTE — Result Encounter Note (Signed)
1. No suspicious pulmonary nodules.  2. Moderate emphysematous changes.    Lung RADS Assessment Category: Negative - 1    Will repeat lung screening in 1 year and notify patient accordingly.  Encourage smoking cessation and continued adherence to annual lung cancer screenings to reduce risk of developing lung cancer.  Recommend following up with PCP and/or specialist for other incidental findings as an additional workup or referral may be considered.  PCP and cardiologist notified.  Patient notified via mychart.    Thanks,     Reva Bores, MSN, APRN, FNP-C, [place]  Lung Cancer Screening & Tobacco Cessation  [phone]

## 2021-02-20 ENCOUNTER — Encounter (HOSPITAL_COMMUNITY): Payer: Self-pay

## 2021-02-20 ENCOUNTER — Ambulatory Visit (HOSPITAL_COMMUNITY): Payer: Self-pay

## 2021-02-20 LAB — PT/INR: INR: 1.6

## 2021-02-20 LAB — TYPE AND SCREEN: ANTIBODY SCREEN: NEGATIVE

## 2021-02-20 NOTE — Progress Notes (Signed)
Anticoagulation Encounter Note:    Surgical AVR plan   Warfarin start date: 01-06-2019   [facility] Staff: Earlie Raveling   DX : On-X AVR   INR Range: 1.5 - 2.0  Tab strength: warfarin 2mg    Contact 1: [phone] cell   Contact 2:   Lab: [facility] [facility]   Comments: Tylenol regularly    INR: 1.6    Current Dose: 6 mg M/ 4 mg all other days    Clinical Outcomes       Positives:  INR In Range (INR low normal. pt called. )             Plan: pt instructed to take warfarin 6 mg once tonight then resume current dosing and recheck INR in two weeks. Dosing verified by pt.     Next INR Date: 03/06/21    [name], BSN RN  Specialty Care Nurse, Anticoagulation  [facility]  Ph. [phone]  (ext. [phone])  Fax [phone]  [email or web address]        For additional information, see dosing calendar below.    Warfarin Therapy Instructions   April 2022 Details    Sun [name] Thu Fri Sat          1               2                 3               4               5               6               7               8               9                 10               11               12               13               14               15               16                 17               18               19               20               21    2.1   4 mg   See details      22      4 mg         23      4 mg           24      4 mg         25      6 mg  26      4 mg         27      4 mg         28      4 mg         29      4 mg         30      4 mg          Date Details   04/21 Last INR check     INR: 2.1              Warfarin Therapy Instructions   May 2022 Details    Sun Mon Tue Wed Thu Fri Sat     1      4 mg         2      6 mg         3      4 mg         4      4 mg         5   1.6   6 mg   See details      6      4 mg         7      4 mg           8      4 mg         9      6 mg         10      4 mg         11      4 mg         12      4 mg         13      4 mg         14      4 mg           15      4 mg          16      6 mg         17      4 mg         18      4 mg         19      6 mg         20               21                 22               23               24               25               26               27               28                 29                30  31                    Date Details   05/05 This INR check     INR: 1.6       Date of next INR:  03/06/2021

## 2021-02-24 ENCOUNTER — Encounter (HOSPITAL_BASED_OUTPATIENT_CLINIC_OR_DEPARTMENT_OTHER): Payer: Self-pay | Admitting: Family

## 2021-02-26 ENCOUNTER — Encounter (HOSPITAL_BASED_OUTPATIENT_CLINIC_OR_DEPARTMENT_OTHER): Payer: Self-pay | Admitting: Orthopaedic Surgery

## 2021-02-27 ENCOUNTER — Encounter (HOSPITAL_COMMUNITY): Payer: Self-pay

## 2021-02-27 ENCOUNTER — Encounter (HOSPITAL_BASED_OUTPATIENT_CLINIC_OR_DEPARTMENT_OTHER): Payer: Self-pay | Admitting: Medical Oncology

## 2021-02-27 NOTE — Nursing Note (Signed)
From: Karle Plumber, APRN,NP-C   Sent: 02/19/2021  2:12 PM EDT   To: Sammuel Cooper, MD, Joaquin Bend, MD   Subject: [name]: mutual patient undergoing R THA 03/13/21      Hello Dr. 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 I believe I sent the message below to Dr. Clementeen Graham, but this is actually a patient that you follow. Would you mind commenting on holding aspirin pre-operatively for her joint replacement surgery vs continuing through the perioperative period?     Thank you so much,   Karle Plumber     ----- Message -----   From: Karle Plumber, APRN,NP-C   Sent: 02/19/2021  9:56 AM EDT   To: Sammuel Cooper, MD   Subject: mutual patient undergoing R THA 03/13/21        Hello Dr. Clementeen Graham,     I am a NP that works with orthopedics. 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 I have reviewed her most recent cardiology note from February that states she can hold her warfarin for 3-5 days pre-operatively, but to continue ASA perioperatively if possible. If we continue ASA perioperatively, her bleeding and infection risk increases. However, if her risk of clotting off her aortic valve with holding the ASA is higher, we will operate through the aspirin. Typically, we prefer to hold ASA 7 days prior to surgery, but of course do not want to cause a greater problem with her aortic valve by doing this.     Just wanted to get your thoughts, as I am not very familiar with this type of valve.     Thank you,   Karle Plumber     From: Karle Plumber, [place]   Sent: 02/26/2021  4:17 PM EDT   To: Skeet Latch, RN   Subject: FW: mutual patient undergoing R THA 03/13/21      Tobey Bride,     I am a NP that works with orthopedics. I have tried to reach Dr. Earlie Raveling (see below) regarding this mutual patient. Trying to determine if we have to continue her aspirin through her joint replacement surgery or if we are able to hold it for 7 days (or as long as he will let us)  before surgery. Are you able to discuss with him and get back with me?     Thanks so much!   Myriam Jacobson     ----- Message -----   From: Karle Plumber, APRN,NP-C   Sent: 02/25/2021  9:21 AM EDT   To: Joaquin Bend, MD   Subject: [name]: mutual patient undergoing R THA 03/13/21      Hello Dr. Earlie Raveling,     I am a NP that works with orthopedics. 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 I have reviewed her most recent cardiology note from February that states she can hold her warfarin for 3-5 days pre-operatively, but to continue ASA perioperatively if possible. If we continue ASA perioperatively, her bleeding and infection risk increases. However, if her risk of clotting off her aortic valve with holding the ASA is higher, we will operate through the aspirin. Typically, we prefer to hold ASA 7 days prior to surgery, but of course do not want to cause a greater problem with her aortic valve by doing this.     Just wanted to get your thoughts, as  I am not very familiar with this type of valve.     Thank you,   Karle Plumber         From: Skeet Latch, RN   Sent: 02/27/2021  1:40 PM EDT   To: Alda Berthold, MD   Subject: FW: mutual patient undergoing R THA 03/13/21      Can you please let me know what pt can do with the ASA.     Thanks, Doylene Canard, Hilliard Clark, MD  Skeet Latch, RN; Sammuel Cooper, MD  Good afternoon,   The patient can hold the aspirin perioperatively as well as the coumadin as it seems there is concern for increased bleeding from anti-platelet therapy, it should be resumed as soon as possible post-operatively. The valve does not need bridged. I have included Dr. Clementeen Graham who saw the patient last to keep him updated as well.   Thanks,   Alda Berthold, MD 02/27/2021, 14:05            Information forwarded to C.Mozingo, NP-C.

## 2021-03-06 ENCOUNTER — Ambulatory Visit (HOSPITAL_COMMUNITY): Payer: Self-pay

## 2021-03-06 ENCOUNTER — Encounter (HOSPITAL_COMMUNITY): Payer: Self-pay

## 2021-03-06 LAB — PT/INR: INR: 1.6

## 2021-03-06 NOTE — Progress Notes (Signed)
Anticoagulation Encounter Note:    Surgical AVR plan   Warfarin start date: 01-06-2019   [facility] Staff: Earlie Raveling   DX : On-X AVR   INR Range: 1.5 - 2.0  Tab strength: warfarin 2mg    Contact 1: [phone] cell   Contact 2:   Lab: [facility]/[place] [facility]   Comments: Tylenol regularly    INR: 1.6    Current Dose: 6 mg M/ 4 mg all other days    Clinical Outcomes       Positives:  INR In Range (INR good. pt called. )          Patient Findings       Positives:  Upcoming invasive procedure (pt for THA 03/13/21. )          Plan: pt to take her last dose warfarin tomorrow and start her five day hold for surgery. Once pt d/c'ed and restarted on warfarin she will call office for dosing and timing for INR check. Pt voiced understanding of all information.     Next INR Date: TBD    Leone Brand, BSN RN  Specialty Care Nurse, Anticoagulation  [facility]  Ph. [phone]  (ext. [phone])  Fax [phone]  [email or web address]        For additional information, see dosing calendar below.    Warfarin Therapy Instructions   May 2022 Details    Sun [name] Thu Fri Sat     1               2               3               4               5    1.6   6 mg   See details      6      4 mg         7      4 mg           8      4 mg         9      6 mg         10      4 mg         11      4 mg         12      4 mg         13      4 mg         14      4 mg           15      4 mg         16      6 mg         17      4 mg         18      4 mg         19   1.6   4 mg   See details      20      4 mg         21      Hold           22      Hold  23      Hold         24      Hold         25      Hold         26      Hold         27      4 mg         28      4 mg           29      4 mg         30      6 mg         31      4 mg              Date Details   05/05 Last INR check     INR: 1.6      05/19 This INR check     INR: 1.6       Date of next INR:  03/18/2021

## 2021-03-08 ENCOUNTER — Inpatient Hospital Stay (HOSPITAL_COMMUNITY)
Admission: RE | Admit: 2021-03-08 | Discharge: 2021-03-08 | Disposition: A | Discharge status: Home / Self Care | Payer: Medicare Other | Source: Ambulatory Visit

## 2021-03-08 ENCOUNTER — Other Ambulatory Visit: Payer: Self-pay

## 2021-03-08 ENCOUNTER — Encounter (HOSPITAL_COMMUNITY): Payer: Self-pay

## 2021-03-08 HISTORY — DX: Cardiac arrhythmia, unspecified: I49.9

## 2021-03-10 ENCOUNTER — Other Ambulatory Visit (HOSPITAL_BASED_OUTPATIENT_CLINIC_OR_DEPARTMENT_OTHER): Payer: Self-pay | Admitting: Family

## 2021-03-10 DIAGNOSIS — Z122 Encounter for screening for malignant neoplasm of respiratory organs: Secondary | ICD-10-CM

## 2021-03-10 DIAGNOSIS — Z87891 Personal history of nicotine dependence: Secondary | ICD-10-CM

## 2021-03-12 MED ORDER — TRANEXAMIC ACID 1,000 MG/10 ML (100 MG/ML) INTRAVENOUS SOLUTION
1000.0000 mg | Freq: Once | INTRAVENOUS | Status: AC
Start: 2021-03-13 — End: 2021-03-13
  Administered 2021-03-13: 1000 mg via INTRAVENOUS
  Filled 2021-03-12: qty 10

## 2021-03-12 MED ORDER — SODIUM CHLORIDE 0.9 % INTRAVENOUS SOLUTION
Freq: Once | INTRAVENOUS | Status: DC
Start: 2021-03-13 — End: 2021-03-13
  Filled 2021-03-12: qty 60

## 2021-03-12 MED ORDER — VANCOMYCIN 10 GRAM INTRAVENOUS SOLUTION
15.0000 mg/kg | Freq: Once | INTRAVENOUS | Status: AC
Start: 2021-03-13 — End: 2021-03-13
  Administered 2021-03-13: 0 mg via INTRAVENOUS
  Administered 2021-03-13: 1250 mg via INTRAVENOUS
  Filled 2021-03-12: qty 12.5

## 2021-03-12 MED ORDER — TRANEXAMIC ACID 1,000 MG/10 ML (100 MG/ML) INTRAVENOUS SOLUTION
1000.0000 mg | Freq: Once | INTRAVENOUS | Status: DC
Start: 2021-03-13 — End: 2021-03-13
  Filled 2021-03-12: qty 10

## 2021-03-12 NOTE — Pharmacy (Signed)
[place] / Department of Pharmaceutical Services  Therapeutic Drug Monitoring: Vancomycin  03/12/2021      Patient name: [name], [name]  Date of Birth:  11-07-1955    Actual Weight:  Weight: 101 kg (222 lb 10.6 oz) (03/12/21 1500)     BMI:  BMI (Calculated): 36.77 (03/12/21 1500)      Date RPh Current regimen (including mg/kg) Indication &  Organism AUC or trough based dosing Target Levels^ SCr (mg/dL) CrCl* (mL/min) Infectious Laboratory Markers (as applicable)   Measured level(s)   (mcg/mL) Calculated AUC (if AUC based monitoring) Plan & predicted AUC/trough if initial dosing (including when levels are due) Comments   05/25 Barnett Applebaum Vanc 1250mg  (pre-op x1 dose) for surgery on 5/26  AdjBW = 75kg Surgical prophyalxis     WBC:  Procal:  CRP:   Will assess abx regimen once pt finishes in OR (+) Cefazolin 2g                                                                                                   ^Target levels depends on dosing and monitoring method, AUC vs. trough based. For AUC based dosing units are mg*h/L. For trough based dosing units are mcg/mL.     *Creatinine clearance is estimated by using the Cockcroft-Gault equation for adult patients and the Carol Ada for pediatric patients.    The decision to discontinue vancomycin therapy will be determined by the primary service.  Please contact the pharmacist with any questions regarding this patient's medication regimen.

## 2021-03-13 ENCOUNTER — Inpatient Hospital Stay (HOSPITAL_COMMUNITY): Payer: Medicare Other | Admitting: Certified Registered"

## 2021-03-13 ENCOUNTER — Inpatient Hospital Stay (HOSPITAL_COMMUNITY): Payer: Medicare Other | Admitting: Orthopaedic Surgery

## 2021-03-13 ENCOUNTER — Encounter (HOSPITAL_COMMUNITY)
Admission: RE | Disposition: A | Discharge status: Home / Self Care | Payer: Self-pay | Source: Ambulatory Visit | Attending: Orthopaedic Surgery

## 2021-03-13 ENCOUNTER — Inpatient Hospital Stay (HOSPITAL_BASED_OUTPATIENT_CLINIC_OR_DEPARTMENT_OTHER): Payer: Medicare Other | Admitting: Certified Registered"

## 2021-03-13 ENCOUNTER — Other Ambulatory Visit: Payer: Self-pay

## 2021-03-13 ENCOUNTER — Inpatient Hospital Stay (HOSPITAL_COMMUNITY): Payer: Medicare Other

## 2021-03-13 ENCOUNTER — Encounter (HOSPITAL_COMMUNITY): Payer: Self-pay | Admitting: Orthopaedic Surgery

## 2021-03-13 ENCOUNTER — Inpatient Hospital Stay
Admission: RE | Admit: 2021-03-13 | Discharge: 2021-03-14 | DRG: 470 | Disposition: A | Discharge status: Home / Self Care | Payer: Medicare Other | Source: Ambulatory Visit | Attending: Orthopaedic Surgery | Admitting: Orthopaedic Surgery

## 2021-03-13 DIAGNOSIS — Z96641 Presence of right artificial hip joint: Secondary | ICD-10-CM

## 2021-03-13 DIAGNOSIS — Z952 Presence of prosthetic heart valve: Secondary | ICD-10-CM

## 2021-03-13 DIAGNOSIS — Z96642 Presence of left artificial hip joint: Secondary | ICD-10-CM

## 2021-03-13 DIAGNOSIS — Z87891 Personal history of nicotine dependence: Secondary | ICD-10-CM

## 2021-03-13 DIAGNOSIS — Z79899 Other long term (current) drug therapy: Secondary | ICD-10-CM

## 2021-03-13 DIAGNOSIS — M5432 Sciatica, left side: Secondary | ICD-10-CM | POA: Diagnosis present

## 2021-03-13 DIAGNOSIS — Z6836 Body mass index (BMI) 36.0-36.9, adult: Secondary | ICD-10-CM

## 2021-03-13 DIAGNOSIS — Z7982 Long term (current) use of aspirin: Secondary | ICD-10-CM

## 2021-03-13 DIAGNOSIS — R5383 Other fatigue: Secondary | ICD-10-CM

## 2021-03-13 DIAGNOSIS — M1611 Unilateral primary osteoarthritis, right hip: Principal | ICD-10-CM | POA: Diagnosis present

## 2021-03-13 DIAGNOSIS — E669 Obesity, unspecified: Secondary | ICD-10-CM | POA: Diagnosis present

## 2021-03-13 DIAGNOSIS — Z9889 Other specified postprocedural states: Secondary | ICD-10-CM

## 2021-03-13 DIAGNOSIS — I10 Essential (primary) hypertension: Secondary | ICD-10-CM

## 2021-03-13 DIAGNOSIS — Z7901 Long term (current) use of anticoagulants: Secondary | ICD-10-CM

## 2021-03-13 DIAGNOSIS — I97191 Other postprocedural cardiac functional disturbances following other surgery: Secondary | ICD-10-CM

## 2021-03-13 DIAGNOSIS — L409 Psoriasis, unspecified: Secondary | ICD-10-CM | POA: Diagnosis present

## 2021-03-13 DIAGNOSIS — G8918 Other acute postprocedural pain: Secondary | ICD-10-CM

## 2021-03-13 DIAGNOSIS — I480 Paroxysmal atrial fibrillation: Secondary | ICD-10-CM | POA: Diagnosis present

## 2021-03-13 LAB — PT/INR
INR: 1.16 (ref 0.80–1.20)
PROTHROMBIN TIME: 13.4 seconds (ref 9.1–13.9)

## 2021-03-13 LAB — POC BLOOD GLUCOSE (RESULTS): GLUCOSE, POC: 174 mg/dl — ABNORMAL HIGH (ref 70–105)

## 2021-03-13 LAB — TYPE AND SCREEN
ABO/RH(D): O POS
ANTIBODY SCREEN: NEGATIVE

## 2021-03-13 SURGERY — ARTHROPLASTY HIP ANTERIOR TOTAL MAKO ROBOTIC ASSISTED
Anesthesia: General | Site: Hip | Laterality: Right | Wound class: Clean Wound: Uninfected operative wounds in which no inflammation occurred

## 2021-03-13 MED ORDER — OXYCODONE 5 MG TABLET
5.0000 mg | ORAL_TABLET | ORAL | Status: DC | PRN
Start: 2021-03-13 — End: 2021-03-14
  Administered 2021-03-13 (×2): 5 mg via ORAL
  Filled 2021-03-13 (×2): qty 1

## 2021-03-13 MED ORDER — CEFAZOLIN 1 GRAM SOLUTION FOR INJECTION
INTRAMUSCULAR | Status: AC
Start: 2021-03-13 — End: 2021-03-13
  Filled 2021-03-13: qty 10

## 2021-03-13 MED ORDER — LACTATED RINGERS INTRAVENOUS SOLUTION
INTRAVENOUS | Status: DC
Start: 2021-03-13 — End: 2021-03-14
  Administered 2021-03-13: 0 mL via INTRAVENOUS

## 2021-03-13 MED ORDER — DIPHENHYDRAMINE 50 MG/ML INJECTION SOLUTION
25.0000 mg | Freq: Four times a day (QID) | INTRAMUSCULAR | Status: DC | PRN
Start: 2021-03-13 — End: 2021-03-14

## 2021-03-13 MED ORDER — SODIUM CHLORIDE 0.9 % IRRIGATION SOLUTION
1000.0000 mL | Status: DC | PRN
Start: 2021-03-13 — End: 2021-03-13
  Administered 2021-03-13: 1000 mL

## 2021-03-13 MED ORDER — DEXTROSE 5 % IN WATER (D5W) INTRAVENOUS SOLUTION
2.0000 g | Freq: Once | INTRAVENOUS | Status: AC
Start: 2021-03-13 — End: 2021-03-13
  Administered 2021-03-13 (×2): 2 g via INTRAVENOUS
  Filled 2021-03-13: qty 20

## 2021-03-13 MED ORDER — MAGNESIUM HYDROXIDE 400 MG/5 ML ORAL SUSPENSION
30.0000 mL | Freq: Two times a day (BID) | ORAL | Status: DC | PRN
Start: 2021-03-13 — End: 2021-03-14

## 2021-03-13 MED ORDER — PROPOFOL 10 MG/ML INTRAVENOUS EMULSION
INTRAVENOUS | Status: AC
Start: 2021-03-13 — End: 2021-03-13
  Filled 2021-03-13: qty 20

## 2021-03-13 MED ORDER — ONDANSETRON HCL (PF) 4 MG/2 ML INJECTION SOLUTION
Freq: Once | INTRAMUSCULAR | Status: DC | PRN
Start: 2021-03-13 — End: 2021-03-13
  Administered 2021-03-13: 4 mg via INTRAVENOUS

## 2021-03-13 MED ORDER — MINERAL OIL ENEMA
133.0000 mL | ENEMA | Freq: Every day | RECTAL | Status: DC | PRN
Start: 2021-03-13 — End: 2021-03-14

## 2021-03-13 MED ORDER — INSULIN LISPRO 100 UNIT/ML SUB-Q SSIP
0.0000 [IU] | INJECTION | Freq: Four times a day (QID) | SUBCUTANEOUS | Status: DC | PRN
Start: 2021-03-13 — End: 2021-03-14
  Administered 2021-03-14 (×2): 0 [IU] via SUBCUTANEOUS

## 2021-03-13 MED ORDER — DEXAMETHASONE SODIUM PHOSPHATE 4 MG/ML INJECTION SOLUTION
Freq: Once | INTRAMUSCULAR | Status: DC | PRN
Start: 2021-03-13 — End: 2021-03-13
  Administered 2021-03-13: 10 mg via INTRAVENOUS

## 2021-03-13 MED ORDER — METOPROLOL SUCCINATE ER 50 MG TABLET,EXTENDED RELEASE 24 HR
50.0000 mg | ORAL_TABLET | Freq: Every day | ORAL | Status: DC
Start: 2021-03-14 — End: 2021-03-14
  Administered 2021-03-14: 50 mg via ORAL
  Filled 2021-03-13: qty 1

## 2021-03-13 MED ORDER — NALOXONE 0.4 MG/ML INJECTION SOLUTION
0.4000 mg | Freq: Once | INTRAMUSCULAR | Status: DC | PRN
Start: 2021-03-13 — End: 2021-03-14

## 2021-03-13 MED ORDER — HYDROMORPHONE 1 MG/ML INJECTION WRAPPER
INJECTION | Freq: Once | INTRAMUSCULAR | Status: DC | PRN
Start: 2021-03-13 — End: 2021-03-13
  Administered 2021-03-13 (×2): .5 mg via INTRAVENOUS

## 2021-03-13 MED ORDER — ONDANSETRON HCL (PF) 4 MG/2 ML INJECTION SOLUTION
INTRAMUSCULAR | Status: AC
Start: 2021-03-13 — End: 2021-03-13
  Filled 2021-03-13: qty 2

## 2021-03-13 MED ORDER — BENZOCAINE-MENTHOL SORE THROAT LOZENGE WRAPPER
1.0000 | LOZENGE | Status: DC | PRN
Start: 2021-03-13 — End: 2021-03-14

## 2021-03-13 MED ORDER — PROPOFOL 10 MG/ML IV BOLUS
INJECTION | Freq: Once | INTRAVENOUS | Status: DC | PRN
Start: 2021-03-13 — End: 2021-03-13
  Administered 2021-03-13 (×2): 80 mg via INTRAVENOUS

## 2021-03-13 MED ORDER — CELECOXIB 100 MG CAPSULE
200.0000 mg | ORAL_CAPSULE | Freq: Every day | ORAL | Status: DC
Start: 2021-03-14 — End: 2021-03-14
  Administered 2021-03-14: 200 mg via ORAL
  Filled 2021-03-13: qty 2

## 2021-03-13 MED ORDER — DEXAMETHASONE SODIUM PHOSPHATE 10 MG/ML INJECTION SOLUTION
10.0000 mg | Freq: Once | INTRAMUSCULAR | Status: AC
Start: 2021-03-14 — End: 2021-03-14
  Administered 2021-03-14: 10 mg via INTRAVENOUS
  Filled 2021-03-13: qty 10

## 2021-03-13 MED ORDER — LORATADINE 10 MG TABLET
10.0000 mg | ORAL_TABLET | Freq: Every day | ORAL | Status: DC
Start: 2021-03-14 — End: 2021-03-14
  Administered 2021-03-14: 10 mg via ORAL
  Filled 2021-03-13: qty 1

## 2021-03-13 MED ORDER — EPHEDRINE SULFATE 5 MG/ML INTRAVENOUS SOLUTION
INTRAVENOUS | Status: AC
Start: 2021-03-13 — End: 2021-03-13
  Filled 2021-03-13: qty 10

## 2021-03-13 MED ORDER — OXYCODONE 5 MG TABLET
10.0000 mg | ORAL_TABLET | ORAL | Status: DC | PRN
Start: 2021-03-13 — End: 2021-03-14
  Filled 2021-03-13: qty 2

## 2021-03-13 MED ORDER — LIDOCAINE (PF) 20 MG/ML (2 %) INJECTION SOLUTION
INTRAMUSCULAR | Status: AC
Start: 2021-03-13 — End: 2021-03-13
  Filled 2021-03-13: qty 5

## 2021-03-13 MED ORDER — BISACODYL 10 MG RECTAL SUPPOSITORY
10.0000 mg | Freq: Every day | RECTAL | Status: DC | PRN
Start: 2021-03-13 — End: 2021-03-14

## 2021-03-13 MED ORDER — MIDAZOLAM 1 MG/ML INJECTION SOLUTION
INTRAMUSCULAR | Status: AC
Start: 2021-03-13 — End: 2021-03-13
  Filled 2021-03-13: qty 2

## 2021-03-13 MED ORDER — ASPIRIN 81 MG TABLET,DELAYED RELEASE
81.0000 mg | DELAYED_RELEASE_TABLET | Freq: Two times a day (BID) | ORAL | Status: DC
Start: 2021-03-14 — End: 2021-03-14
  Administered 2021-03-14: 81 mg via ORAL
  Filled 2021-03-13: qty 1

## 2021-03-13 MED ORDER — MAGNESIUM CITRATE ORAL SOLUTION
296.0000 mL | Freq: Every day | ORAL | Status: DC | PRN
Start: 2021-03-13 — End: 2021-03-14

## 2021-03-13 MED ORDER — CELECOXIB 400 MG CAPSULE
400.0000 mg | ORAL_CAPSULE | Freq: Once | ORAL | Status: AC
Start: 2021-03-13 — End: 2021-03-13
  Administered 2021-03-13: 400 mg via ORAL
  Filled 2021-03-13: qty 1

## 2021-03-13 MED ORDER — SODIUM CHLORIDE 0.9 % IRRIGATION SOLUTION
3000.0000 mL | Status: DC | PRN
Start: 2021-03-13 — End: 2021-03-13

## 2021-03-13 MED ORDER — ETHYL ALCOHOL 62 % (NOZIN NASAL SANITIZER) NASAL SOLUTION - BULK BOTTLE
1.0000 | Freq: Three times a day (TID) | NASAL | Status: DC
Start: 2021-03-13 — End: 2021-03-14
  Administered 2021-03-13 – 2021-03-14 (×2): 1 via NASAL

## 2021-03-13 MED ORDER — WARFARIN 5 MG TABLET
6.0000 mg | ORAL_TABLET | Freq: Every evening | ORAL | Status: DC
Start: 2021-03-13 — End: 2021-03-14
  Administered 2021-03-13: 6 mg via ORAL
  Filled 2021-03-13 (×2): qty 1

## 2021-03-13 MED ORDER — SODIUM CHLORIDE 0.9 % (FLUSH) INJECTION SYRINGE
2.0000 mL | INJECTION | INTRAMUSCULAR | Status: DC | PRN
Start: 2021-03-13 — End: 2021-03-14

## 2021-03-13 MED ORDER — LACTATED RINGERS INTRAVENOUS SOLUTION
INTRAVENOUS | Status: DC
Start: 2021-03-13 — End: 2021-03-13
  Administered 2021-03-13: 0 via INTRAVENOUS

## 2021-03-13 MED ORDER — SODIUM CHLORIDE 0.9 % (FLUSH) INJECTION SYRINGE
2.0000 mL | INJECTION | Freq: Three times a day (TID) | INTRAMUSCULAR | Status: DC
Start: 2021-03-13 — End: 2021-03-14
  Administered 2021-03-13: 0 mL
  Administered 2021-03-13: 6 mL
  Administered 2021-03-14: 0 mL

## 2021-03-13 MED ORDER — ONDANSETRON HCL (PF) 4 MG/2 ML INJECTION SOLUTION
4.0000 mg | Freq: Four times a day (QID) | INTRAMUSCULAR | Status: AC | PRN
Start: 2021-03-13 — End: 2021-03-14

## 2021-03-13 MED ORDER — ONDANSETRON HCL (PF) 4 MG/2 ML INJECTION SOLUTION
4.0000 mg | Freq: Once | INTRAMUSCULAR | Status: DC
Start: 2021-03-13 — End: 2021-03-13

## 2021-03-13 MED ORDER — EPHEDRINE SULFATE 50 MG/ML INTRAVENOUS SOLUTION
Freq: Once | INTRAVENOUS | Status: DC | PRN
Start: 2021-03-13 — End: 2021-03-13
  Administered 2021-03-13: 10 mg via INTRAVENOUS
  Administered 2021-03-13 (×2): 5 mg via INTRAVENOUS

## 2021-03-13 MED ORDER — SODIUM CHLORIDE 0.9 % INTRAVENOUS SOLUTION
INTRAVENOUS | Status: DC
Start: 2021-03-13 — End: 2021-03-14

## 2021-03-13 MED ORDER — CELECOXIB 200 MG CAPSULE
200.0000 mg | ORAL_CAPSULE | Freq: Every day | ORAL | 0 refills | Status: DC
Start: 2021-03-13 — End: 2021-03-14

## 2021-03-13 MED ORDER — DEXTROSE 5 % IN WATER (D5W) INTRAVENOUS SOLUTION
2.0000 g | Freq: Three times a day (TID) | INTRAVENOUS | Status: AC
Start: 2021-03-13 — End: 2021-03-14
  Administered 2021-03-13: 0 g via INTRAVENOUS
  Administered 2021-03-13: 2 g via INTRAVENOUS
  Administered 2021-03-14: 0 g via INTRAVENOUS
  Administered 2021-03-14: 2 g via INTRAVENOUS
  Filled 2021-03-13 (×2): qty 20

## 2021-03-13 MED ORDER — OXYCODONE 5 MG TABLET
1.0000 | ORAL_TABLET | ORAL | 0 refills | Status: DC | PRN
Start: 2021-03-13 — End: 2021-07-24

## 2021-03-13 MED ORDER — SIMETHICONE 80 MG CHEWABLE TABLET
80.0000 mg | CHEWABLE_TABLET | Freq: Three times a day (TID) | ORAL | Status: DC | PRN
Start: 2021-03-13 — End: 2021-03-14

## 2021-03-13 MED ORDER — DEXAMETHASONE SODIUM PHOSPHATE (PF) 10 MG/ML INJECTION SOLUTION
10.0000 mg | Freq: Once | INTRAMUSCULAR | Status: DC
Start: 2021-03-13 — End: 2021-03-13

## 2021-03-13 MED ORDER — ONDANSETRON HCL 4 MG TABLET
4.0000 mg | ORAL_TABLET | Freq: Four times a day (QID) | ORAL | 0 refills | Status: DC | PRN
Start: 2021-03-13 — End: 2021-07-24

## 2021-03-13 MED ORDER — HYDROCHLOROTHIAZIDE 25 MG TABLET
25.0000 mg | ORAL_TABLET | Freq: Every day | ORAL | Status: DC
Start: 2021-03-14 — End: 2021-03-14
  Administered 2021-03-14: 25 mg via ORAL
  Filled 2021-03-13: qty 1

## 2021-03-13 MED ORDER — SUGAMMADEX 100 MG/ML INTRAVENOUS SOLUTION
INTRAVENOUS | Status: AC
Start: 2021-03-13 — End: 2021-03-13
  Filled 2021-03-13: qty 2

## 2021-03-13 MED ORDER — FENTANYL (PF) 50 MCG/ML INJECTION SOLUTION
Freq: Once | INTRAMUSCULAR | Status: DC | PRN
Start: 2021-03-13 — End: 2021-03-13
  Administered 2021-03-13: 100 ug via INTRAVENOUS

## 2021-03-13 MED ORDER — SENNOSIDES 8.6 MG-DOCUSATE SODIUM 50 MG TABLET
2.0000 | ORAL_TABLET | Freq: Two times a day (BID) | ORAL | Status: DC
Start: 2021-03-13 — End: 2021-03-14
  Administered 2021-03-13 – 2021-03-14 (×2): 2 via ORAL
  Filled 2021-03-13 (×2): qty 2

## 2021-03-13 MED ORDER — FENTANYL (PF) 50 MCG/ML INJECTION SOLUTION
INTRAMUSCULAR | Status: AC
Start: 2021-03-13 — End: 2021-03-13
  Filled 2021-03-13: qty 2

## 2021-03-13 MED ORDER — MIDAZOLAM (PF) 1 MG/ML INJECTION SOLUTION
Freq: Once | INTRAMUSCULAR | Status: DC | PRN
Start: 2021-03-13 — End: 2021-03-13
  Administered 2021-03-13: 2 mg via INTRAVENOUS

## 2021-03-13 MED ORDER — LIDOCAINE (PF) 100 MG/5 ML (2 %) INTRAVENOUS SYRINGE
INJECTION | Freq: Once | INTRAVENOUS | Status: DC | PRN
Start: 2021-03-13 — End: 2021-03-13
  Administered 2021-03-13: 100 mg via INTRAVENOUS

## 2021-03-13 MED ORDER — FENTANYL (PF) 50 MCG/ML INJECTION SOLUTION
25.0000 ug | INTRAMUSCULAR | Status: DC | PRN
Start: 2021-03-13 — End: 2021-03-13
  Administered 2021-03-13: 25 ug via INTRAVENOUS
  Filled 2021-03-13: qty 2

## 2021-03-13 MED ORDER — SENNOSIDES 8.6 MG-DOCUSATE SODIUM 50 MG TABLET
1.0000 | ORAL_TABLET | Freq: Two times a day (BID) | ORAL | 0 refills | Status: DC
Start: 2021-03-13 — End: 2022-02-03

## 2021-03-13 MED ORDER — SODIUM CHLORIDE 0.9 % (FLUSH) INJECTION SYRINGE
2.0000 mL | INJECTION | Freq: Three times a day (TID) | INTRAMUSCULAR | Status: DC
Start: 2021-03-13 — End: 2021-03-14
  Administered 2021-03-13 – 2021-03-14 (×4): 0 mL

## 2021-03-13 MED ORDER — ACETAMINOPHEN 1,000 MG/100 ML (10 MG/ML) INTRAVENOUS SOLUTION
1000.0000 mg | Freq: Once | INTRAVENOUS | Status: DC
Start: 2021-03-13 — End: 2021-03-13
  Administered 2021-03-13: 0 mg via INTRAVENOUS
  Administered 2021-03-13: 1000 mg via INTRAVENOUS
  Filled 2021-03-13: qty 100

## 2021-03-13 MED ORDER — ACETAMINOPHEN 325 MG TABLET
650.0000 mg | ORAL_TABLET | Freq: Four times a day (QID) | ORAL | Status: DC
Start: 2021-03-14 — End: 2021-03-14
  Administered 2021-03-14: 650 mg via ORAL
  Administered 2021-03-14: 0 mg via ORAL
  Filled 2021-03-13: qty 2

## 2021-03-13 MED ORDER — ROCURONIUM 10 MG/ML INTRAVENOUS SOLUTION
Freq: Once | INTRAVENOUS | Status: DC | PRN
Start: 2021-03-13 — End: 2021-03-13
  Administered 2021-03-13: 40 mg via INTRAVENOUS
  Administered 2021-03-13 (×2): 10 mg via INTRAVENOUS

## 2021-03-13 MED ORDER — PROCHLORPERAZINE EDISYLATE 10 MG/2 ML (5 MG/ML) INJECTION SOLUTION
10.0000 mg | Freq: Four times a day (QID) | INTRAMUSCULAR | Status: DC | PRN
Start: 2021-03-13 — End: 2021-03-14

## 2021-03-13 MED ORDER — APREPITANT 40 MG CAPSULE
40.0000 mg | ORAL_CAPSULE | Freq: Once | ORAL | Status: AC
Start: 2021-03-13 — End: 2021-03-13
  Administered 2021-03-13 (×2): 40 mg via ORAL
  Filled 2021-03-13: qty 1

## 2021-03-13 MED ORDER — ETHYL ALCOHOL 62 % (NOZIN NASAL SANITIZER) NASAL SOLUTION - BULK BOTTLE
3.0000 | Freq: Once | NASAL | Status: AC
Start: 2021-03-13 — End: 2021-03-13
  Administered 2021-03-13: 3 via NASAL

## 2021-03-13 MED ORDER — HYDROMORPHONE 1 MG/ML INJECTION WRAPPER
INJECTION | INTRAMUSCULAR | Status: AC
Start: 2021-03-13 — End: 2021-03-13
  Filled 2021-03-13: qty 1

## 2021-03-13 SURGICAL SUPPLY — 27 items
BLANKET MDTHR MUL-T-BLNKT PED 64X25IN PLMR FBRC 2 SD COLD 2 FEMALE CONN NWVN WARM NONST LF  DISP CLR (TEMP) ×1 IMPLANT
BLANKET MUL-T-BLNKT ADULT 64X25IN COLD CONN WARM NONST LF  REUSE (ORTHOPEDICS (NOT IMPLANTS)) ×1 IMPLANT
CONV USE 338635 - PACK SURG CSTM ANT  HIP NONST DISP LF (CUSTOM TRAYS & PACK) ×1 IMPLANT
CONV USE ITEM 156524 - ADHESIVE TISSUE EXOFIN 1.0ML_PREMIERPRO EXOFIN (SEALANTS) ×1 IMPLANT
DRAPE 2 INCS FILM ANTIMIC 13X1_3IN IOBN STRL SURG (EQUIPMENT MINOR) ×1
DRAPE ADH 51X47IN STRDRP LF  STRL DISP SURG CLR (PROTECTIVE PRODUCTS/GARMENTS) ×1 IMPLANT
DRAPE ADH 51X47IN U STRDRP LF STRL DISP SURG PLASTIC CLR (PROTECTIVE PRODUCTS/GARMENTS) ×1
DRAPE ADH STRP LRG TWL 23X17IN_STRDRP LF STRL DISP SURG (PROTECTIVE PRODUCTS/GARMENTS) ×1
DRAPE ANTIMIC INCS 13X13IN IOBN2 STRL SURG (EQUIPMENT MINOR) ×1 IMPLANT
DRAPE TWL PLASTIC ADH 23X17IN LRG STRDRP STRL SURG TRNSPR (PROTECTIVE PRODUCTS/GARMENTS) ×1 IMPLANT
HEAD V40 36MM +0MM OFST TAPER HIP BLX D FEM STRL ×1 IMPLANT
INSERT ACET TRIDENT 36MM 0D E X3 HIP STRL LF ×1 IMPLANT
KIT 1 PC PCKT DRP RIO RIO (ORTHOPEDICS (NOT IMPLANTS)) ×1 IMPLANT
KIT DRP PCKT RIO (ORTHOPEDICS (NOT IMPLANTS)) ×1
MAKO HIP CAP ×1 IMPLANT
MARKER SKIN PREP RST RLR LBL R EG TIP STRL (OR) ×1
MARKER SKIN PREP RST RLR LBL REG TIP STRL (OR) ×1 IMPLANT
MARKER SURG CKPNT HEX IMPCT 3.5MM STRL (ORTHOPEDICS (NOT IMPLANTS)) ×1 IMPLANT
NAVIGATE VIZADISC HIP TRK KIT SYSTEM STRL LF  DISP (KITS & TRAYS (DISPOSABLE)) ×1 IMPLANT
PACK CUSTOM ANTERIOR HIP (CUSTOM TRAYS & PACK) ×1
PEN SURG MRKNG RCHRD-ALLAN SKIN FN TIP LBL RLR STRL LF (MARK) ×1 IMPLANT
PIN FIX 170MM 4MM KNEE STRL (ORTHOPEDICS (NOT IMPLANTS)) ×2 IMPLANT
SEALER ESURG .226IN .137IN AQUAMANTYS 6 30D .236IN SPC BIPOLAR 2 ELECTRODE HMST 5.1IN STRL LF  DISP (CAUTERY SUPPLIES) ×1 IMPLANT
SHELL ACET 52MM HIP CLUST H TRIT TRIDENT II E STRL ×1 IMPLANT
SOL IRRG 0.9% NACL 3L PLASTIC CONTAINR UROMATIC LF (SOLUTIONS) ×1 IMPLANT
SOLUTION IRRG NS 3000CC 2B7127_4/CS (SOLUTIONS) ×1
STEM FEM ACCOLADE V40 108MM PRFT PUREFIX TI HIP 127D 5 44MM HI OFST STRL ×1 IMPLANT

## 2021-03-13 NOTE — Consults (Signed)
[facility]  Initial Consultation     [name], [place], 65 y.o. female Requested by: Sharlene Motts, MD   Date of Birth:  1956/05/05 Date of Service:  03/13/2021    Medical Record Number: [id number] Attending: [name], *   Service: Steva Colder   Information Obtained from: patient     Reason for consult: Medical optimization    ASSESSMENT/PLAN     Problem List as of 03/13/2021        Cardiovascular System    Essential hypertension - Continue metoprolol.  Resume HCTZ tomorrow if SBP > 110 and kidney function stable.      S/P AVR (aortic valve replacement) - Resume aspirin tomorrow.  Resume home dose warfarin (6 mg on Monday and Thursday, 4 mg on all other days, goal INR 1.5-2.0)      Postoperative atrial fibrillation (CMS HCC) - warfarin and metoprolol management as above.  Check EKG and place on telemetry if any chest pain/ discomfort, shortness of breath, dizziness/ lightheadedness, or palpitations.       Musculoskeletal    Primary osteoarthritis of right hip - POD # 0 s/p R THA.  Pain management as per primary service.  Patient should resume aspirin POD #1, ultimately warfarin for DVT ppx.             HPI     [name] is a 65 y.o.female who presented today for elective total joint arthroplasty for management of pain and related disability and impaired quality of life related to osteoarthritis of the right hip.    Patient reports feeling tired and still somewhat sedated.  She reports current post-surgical pain well controlled.  She denies chest pain/discomfort/tightness, shortness of breath, dizziness/lightheadedness, nausea.    Home medical problems include paroxysmal a.fib, HTN, hx aortic valve replacement.    Patient denies recent illnesses or changes to her health or medications.  Home medications reviewed.      MEDICAL HISTORY     Past Medical History:   Diagnosis Date   . Abdominal hernia 04/29/2020    hx of 3 repairs   .  Anemia    . Aortic stenosis    . Arthritis    . Atrial fibrillation (CMS HCC) 04/29/2020    one episode  after AVR   . Back problem 04/29/2020    LBP   . Beta blocker prescribed for left ventricular systolic dysfunction    . Blood in stool     positive cologuard.  Has not yet scheduled colonoscopy   . Blood thinned due to long-term anticoagulant use 04/29/2020    coumadin   . Cancer (CMS HCC)     skin cancer L lip.  Needs MOHS to be scheduled   . Chronic pain 04/29/2020    back and right hip   . Colon polyp    . Dysrhythmias    . Heart murmur 01/04/2019    AVR   . High blood pressure    . Motion sickness    . Obesity    . Rash 04/29/2020    scalp psoriasis and involves R ear   . Valvular disease     aortic valve stenosis, following with CT surgery, echo 02/2016 and cath 03/2016   . Wears glasses      Past Surgical History:   Procedure Laterality Date   . HX AORTIC VALVE REPLACEMENT     . HX CESAREAN SECTION  1984   . HX HEART CATHETERIZATION  x2 with no intervention per pt report   . HX HERNIA REPAIR  2004, 2006, 2008    x3   . HX HIP REPLACEMENT Left 08/11/2016   . HX HYSTERECTOMY  2003   . HX OOPHORECTOMY      1 ovary      Allergies   Allergen Reactions   . Iv Contrast Anaphylaxis   . Oregano      Hives     . Stadol [Butorphanol Tartrate] Mental Status Effect   . Tomato Hives/ Urticaria     Outpatient Medications Marked as Taking for the 03/13/21 encounter [facility] Encounter)   Medication Sig   . acetaminophen (TYLENOL) 500 mg Oral Tablet Take 650 mg by mouth Every night Taking 2 daily   . ascorbic acid, vitamin C, (VITAMIN C) 500 mg Oral Tablet Take 250 mg by mouth Once a day   . celecoxib (CELEBREX) 200 mg Oral Capsule Take 1 Capsule (200 mg total) by mouth Once a day   . Cholecalciferol, Vitamin D3, 50 mcg (2,000 unit) Oral Capsule Take 2,000 Units by mouth Once a day    . hydroCHLOROthiazide (HYDRODIURIL) 25 mg Oral Tablet Take 1 Tablet (25 mg total) by mouth Once a day   . loratadine (CLARITIN) 10 mg Oral  Tablet Take 10 mg by mouth Once a day   . metoprolol succinate (TOPROL-XL) 50 mg Oral Tablet Sustained Release 24 hr Take 1 Tablet (50 mg total) by mouth Once a day   . ondansetron (ZOFRAN) 4 mg Oral Tablet Take 1 Tablet (4 mg total) by mouth Every 6 hours as needed for Nausea/Vomiting   . oxyCODONE (ROXICODONE) 5 mg Oral Tablet Take 1-2 Tablets (5-10 mg total) by mouth Every 4 hours as needed for Pain   . sennosides-docusate sodium (SENOKOT-S) 8.6-50 mg Oral Tablet Take 1 Tablet by mouth Twice daily     Social History     Tobacco Use   . Smoking status: Former Smoker     Packs/day: 1.00     Years: 42.00     Pack years: 42.00     Types: Cigarettes     Start date: 60     Quit date: 2014     Years since quitting: 8.4   . Smokeless tobacco: Never Used   Vaping Use   . Vaping Use: Never used   Substance Use Topics   . Alcohol use: Yes     Comment: social only   . Drug use: Never     Family Medical History:     Problem Relation (Age of Onset)    Atrial fibrillation Sister    Heart Disease Mother    Hypertension (High Blood Pressure) Father    Lung Cancer Father, Brother    Mitral valve prolapse Sister    Sudden Death no cause Mother          REVIEW OF SYSTEMS   Review of Systems   Constitutional: Positive for fatigue.   Musculoskeletal: Positive for arthralgias.   All other systems reviewed and are negative.    EXAM     Temp  Avg: 36.1 C (97 F)  Min: 36 C (96.8 F)  Max: 36.3 C (97.3 F)  Pulse  Avg: 68.1  Min: 66  Max: 74 BP  Min: 107/59  Max: 158/83  Resp  Avg: 13.6  Min: 12  Max: 17 SpO2  Avg: 95.8 %  Min: 90 %  Max: 100 %  Physical Exam  Constitutional:  General: She is not in acute distress.     Appearance: She is well-developed. She is not diaphoretic.      Comments: Awake.  Appears mildly ill, tired/ mildly sedated, generally comfortable lying in PACU   HENT:      Head: Normocephalic and atraumatic.   Eyes:      General: No scleral icterus.     Conjunctiva/sclera: Conjunctivae normal.      Pupils:  Pupils are equal, round, and reactive to light.   Neck:      Vascular: No JVD.      Trachea: No tracheal deviation.   Cardiovascular:      Rate and Rhythm: Regular rhythm. Bradycardia present.      Heart sounds: Normal heart sounds.   Pulmonary:      Effort: Pulmonary effort is normal. No respiratory distress.      Breath sounds: Normal breath sounds. No wheezing or rales.   Abdominal:      General: Bowel sounds are normal. There is no distension.      Palpations: Abdomen is soft.      Tenderness: There is no abdominal tenderness. There is no guarding or rebound.   Musculoskeletal:      Cervical back: Normal range of motion and neck supple.      Right lower leg: No edema.      Left lower leg: No edema.   Skin:     General: Skin is warm and dry.      Coloration: Skin is pale.      Findings: No rash.   Neurological:      Mental Status: She is oriented to person, place, and time.      Cranial Nerves: No cranial nerve deficit.      Coordination: Coordination normal.   Psychiatric:         Behavior: Behavior normal.         Thought Content: Thought content normal.         Judgment: Judgment normal.       DIAGNOSTIC STUDIES      I have personally reviewed the following labs:    Lab Results     02/19/21  1247   WBC 5.9   HGB 13.3   HCT 41.1   PLTCNT 265     Lab Results     02/19/21  1247   PMNS 59   MONOCYTES 8   BASOPHILS 0  <0.10   PMNABS 3.49   LYMPHSABS 1.80   EOSABS <0.10   MONOSABS 0.49      Lab Results     02/19/21  1247   SODIUM 133*   POTASSIUM 3.7   CHLORIDE 95*   CO2 28   BUN 18   CREATININE 0.72   GLUCOSENF 76   ANIONGAP 10   BUNCRRATIO 25*   GFR 88      No results for input(s): PREALBUMIN, LIPASE, UROBILINOGEN, GAMMAGT, LDH, AMYLASE, AMMONIA in the last 2160 hours.  Lab Results     03/13/21  0937   PROTHROMTME 13.4   INR 1.16     No results for input(s): HA1C in the last 2160 hours.  No results for input(s): CREAPROINFLA, ESR in the last 2160 hours.    IMAGING:  I have personally reviewed the following  reports:  Results for orders placed or performed during the hospital encounter of 03/13/21 (from the past 72 hour(s))   XR PELVIS CENTERED LOW - TO BE DONE STAT IN PACU/RECOVERY  Status: None    Narrative    Shaquoia Halleck  Female, 65 years old.    XR PELVIS CENTERED LOW performed on 03/13/2021 1:14 PM.    REASON FOR EXAM:  S/P HIP REPLACEMENT    TECHNIQUE: 1 views/1 images submitted for interpretation.    COMPARISON:  02/19/2021    FINDINGS: There are acute postoperative changes of right total hip arthroplasty. The acetabular inclination angle measures approximately 47 degrees. Postoperative soft tissue air is noted. There is no acute fracture.      Impression    Acute postoperative changes of right total hip arthroplasty.         VTE PROPHYLAXIS  aspirin and warfarin  Anticoagulants (last 24 hours)     None           Patient/ Family Discussion: Discussed plan of care with patient. All questions were answered to patient's satisfaction.    Baxter Hire, MD  03/13/2021 16:07   Assistant Professor   Co-Director, Engineer, production  Department of [place]  [place]

## 2021-03-13 NOTE — Care Plan (Signed)
Patient tolerates ambulation via assist X1 with FWW; Fall precautions maintained and motion sensor pad in place. Tolerating a regular 2000 CC diet without complaints of nausea. LBM PTA and voids spontaneously without difficulty.  Pain managed with PRN medications. Dressings to R hip remain CDI. Discharge planning ongoing.        Problem: Adult Inpatient Plan of Care  Goal: Plan of Care Review  Outcome: Ongoing (see interventions/notes)  Flowsheets (Taken 03/13/2021 1849)  Plan of Care Reviewed With:   patient   daughter  Goal: Patient-Specific Goal (Individualized)  Outcome: Ongoing (see interventions/notes)  Flowsheets  Taken 03/13/2021 1849  Individualized Care Needs: hip precautions on R side  Taken 03/13/2021 1800  Individualized Care Needs: hip precautions  Patient-Specific Goals (Include Timeframe): to recover quickly  Goal: Absence of Hospital-Acquired Illness or Injury  Outcome: Ongoing (see interventions/notes)  Intervention: Identify and Manage Fall Risk  Recent Flowsheet Documentation  Taken 03/13/2021 1840 by Rica Mote, RN  Safety Promotion/Fall Prevention:   activity supervised   fall prevention program maintained   nonskid shoes/slippers when out of bed   safety round/check completed  Intervention: Prevent Skin Injury  Recent Flowsheet Documentation  Taken 03/13/2021 1840 by Rica Mote, RN  Body Position: supine, head elevated  Intervention: Prevent and Manage VTE (Venous Thromboembolism) Risk  Recent Flowsheet Documentation  Taken 03/13/2021 1840 by Rica Mote, RN  VTE Prevention/Management:   ambulation promoted   anticoagulant therapy maintained   dorsiflexion/plantar flexion performed   sequential compression devices on  Goal: Optimal Comfort and Wellbeing  Outcome: Ongoing (see interventions/notes)  Intervention: Provide Person-Centered Care  Recent Flowsheet Documentation  Taken 03/13/2021 1840 by Rica Mote, RN  Trust Relationship/Rapport:   care explained   choices  provided   emotional support provided   questions answered   questions encouraged

## 2021-03-13 NOTE — Progress Notes (Signed)
[facility]  Adult Reconstruction Service  Progress Note  03/13/2021    Name: [name]  DOB: [birth year]  MRN: [id number]     Interval History:  Evaluated in PACU for post-op check. Comfy, somewhat somnolent. Pain well controlled     Physical Exam:  Right LE  -dressing in place, CDI  -no motor/sensory due to somnolence  -cap refill <2, DP 2+           LABS:  Hemogram   Lab Results   Component Value Date/Time    WBC 5.9 02/19/2021 12:47 PM    HGB 13.3 02/19/2021 12:47 PM    HCT 41.1 02/19/2021 12:47 PM    PLTCNT 265 02/19/2021 12:47 PM    RBC 4.50 02/19/2021 12:47 PM    MCV 91.3 02/19/2021 12:47 PM    MCHC 32.4 02/19/2021 12:47 PM    MCH 29.6 02/19/2021 12:47 PM    RDW 12.2 08/14/2016 04:35 AM    MPV 8.9 02/19/2021 12:47 PM        Basic Metabolic Profile    Lab Results   Component Value Date/Time    SODIUM 133 (L) 02/19/2021 12:47 PM    POTASSIUM 3.7 02/19/2021 12:47 PM    CHLORIDE 95 (L) 02/19/2021 12:47 PM    CO2 28 02/19/2021 12:47 PM    ANIONGAP 10 02/19/2021 12:47 PM    Lab Results   Component Value Date/Time    BUN 18 02/19/2021 12:47 PM    CREATININE 0.72 02/19/2021 12:47 PM    GLUCOSENF 76 02/19/2021 12:47 PM              A & P:  65 y.o. female s/p R THA on 5/26 w/ Dr. Sharlene Motts      Pain control   WBAT  Anterior Hip precautions  DVT ppx: ASA and Coumadin  PT/OT: to see  Consults: to see  Dispo planning    Gracelyn Nurse, MD  03/13/2021, 12:34

## 2021-03-13 NOTE — Brief Op Note (Signed)
[facility]                                                     BRIEF OPERATIVE NOTE    Patient Name: [name], [name] Number: [id number]  Date of Service: 03/13/2021   Date of Birth: 07/16/1956    All elements must be documented.    Pre-Operative Diagnosis: R hip OA   Post-Operative Diagnosis: Same  Procedure(s)/Description:  R THA  Findings: Hip OA    Attending Surgeon: Sharlene Motts  Assistant(s): Liane Comber    Anesthesia Type: General endotracheal anesthesia  Estimated Blood Loss:  60mL  Blood Given: none  Fluids Given: See anestehsia note  Complications (not routinely expected or not inherent to difficulty/nature of procedure):None  Characteristic Event (routinely expected or inherent to the difficulty/nature of the procedure): None  Did the use of current and/or prior Anticoagulants impact the outcome of the case? no  Wound Class: Clean Wound: Uninfected operative wounds in which no inflammation occurred    Tubes: None  Drains: None  Specimens/ Cultures: None  Implants: [employer]           Disposition: PACU - hemodynamically stable.  Condition: stable      Hildred Alamin, MD 03/13/2021, 12:33

## 2021-03-13 NOTE — Nurses Notes (Deleted)
Patient had bladder scan performed per protocol of PACU, it showed >3875.  Patient was cathed and 6450 ml of clear, yellow urine was drained from patient's bladder.  Ortho paged.

## 2021-03-13 NOTE — Anesthesia Transfer of Care (Signed)
ANESTHESIA TRANSFER OF CARE   [name] is a 65 y.o. ,female, Weight: 101 kg (221 lb 9 oz)   had Procedure(s) with comments:  ARTHROPLASTY HIP ANTERIOR TOTAL MAKO ROBOTIC ASSISTED - SS  performed  03/13/21   Primary Service: Legrand Como Fr*    Past Medical History:   Diagnosis Date   . Abdominal hernia 04/29/2020    hx of 3 repairs   . Anemia    . Aortic stenosis    . Arthritis    . Atrial fibrillation (CMS HCC) 04/29/2020    one episode  after AVR   . Back problem 04/29/2020    LBP   . Beta blocker prescribed for left ventricular systolic dysfunction    . Blood in stool     positive cologuard.  Has not yet scheduled colonoscopy   . Blood thinned due to long-term anticoagulant use 04/29/2020    coumadin   . Cancer (CMS HCC)     skin cancer L lip.  Needs MOHS to be scheduled   . Chronic pain 04/29/2020    back and right hip   . Colon polyp    . Dysrhythmias    . Heart murmur 01/04/2019    AVR   . High blood pressure    . Motion sickness    . Obesity    . Rash 04/29/2020    scalp psoriasis and involves R ear   . Valvular disease     aortic valve stenosis, following with CT surgery, echo 02/2016 and cath 03/2016   . Wears glasses       Allergy History as of 03/13/21     BUTORPHANOL TARTRATE       Noted Status Severity Type Reaction    01/08/16 1311 Flossie Buffy 01/08/16 Active   Mental Status Effect          IV CONTRAST       Noted Status Severity Type Reaction    01/08/16 1312 Flossie Buffy 01/08/16 Active High  Anaphylaxis          TOMATO       Noted Status Severity Type Reaction    01/08/16 1312 Flossie Buffy 01/08/16 Active Low  Hives/ Urticaria          OREGANO       Noted Status Severity Type Reaction    08/04/16 1129 Rozzell, Kelly Splinter, RN 01/08/16 Active       Comments: Hives       01/08/16 1312 Flossie Buffy 01/08/16 Active                 I completed my transfer of care / handoff to the receiving personnel during which we discussed:  Access, Airway, All key/critical aspects of case  discussed, Analgesia, Antibiotics, Expectation of post procedure, Fluids/Product, Gave opportunity for questions and acknowledgement of understanding, Labs and PMHx    Post Location: PACU                                          Additional Info:Tx to PACU;  Report to RN;   VSS                        Last OR Temp: Temperature: 36 C (96.8 F)  ABG:  PH (ARTERIAL)   Date Value Ref Range Status   01/04/2019 7.33 (L) 7.35 - 7.45  Final     PCO2 (ARTERIAL)   Date Value Ref Range Status   01/04/2019 48.0 (H) 35.0 - 45.0 mm/Hg Final     PCO2 (PCO2P)   Date Value Ref Range Status   01/04/2019 53 (HH) 35 - 45 mmHg Final     PO2 (ARTERIAL)   Date Value Ref Range Status   01/04/2019 97.0 72.0 - 100.0 mm/Hg Final     PO2 (PO2P)   Date Value Ref Range Status   01/04/2019 383 (H) 72 - 100 mmHg Final     SODIUM   Date Value Ref Range Status   01/04/2019 134 (L) 137 - 145 mmol/L Final     POTASSIUM   Date Value Ref Range Status   02/19/2021 3.7 3.5 - 5.1 mmol/L Final   01/18/2019 4.4  Final     KETONES   Date Value Ref Range Status   12/29/2018 Negative Negative mg/dL Final     POTASSIUM, POC   Date Value Ref Range Status   01/04/2019 3.8 3.5 - 5.0 mmol/L Final     WHOLE BLOOD POTASSIUM   Date Value Ref Range Status   01/04/2019 3.5 3.5 - 4.6 mmol/L Final     CHLORIDE   Date Value Ref Range Status   01/04/2019 109 101 - 111 mmol/L Final     CALCIUM   Date Value Ref Range Status   02/19/2021 9.4 8.8 - 10.2 mg/dL Final   01/18/2019 9.1  Final     Calculated P Axis   Date Value Ref Range Status   02/19/2021 47 degrees Final     Calculated R Axis   Date Value Ref Range Status   02/19/2021 25 degrees Final     Calculated T Axis   Date Value Ref Range Status   02/19/2021 57 degrees Final     IONIZED CALCIUM   Date Value Ref Range Status   01/06/2019 1.14 1.10 - 1.35 mmol/L Final     IONIZED CALCIUM, POC   Date Value Ref Range Status   01/04/2019 1.05 (L) 1.30 - 1.46 mmol/L Final     LACTATE   Date Value Ref Range Status   01/04/2019 1.1  0.0 - 1.3 mmol/L Final     HEMOGLOBIN   Date Value Ref Range Status   01/04/2019 12.1 12.0 - 18.0 g/dL Final     OXYHEMOGLOBIN   Date Value Ref Range Status   01/04/2019 95.5 85.0 - 98.0 % Final     CARBOXYHEMOGLOBIN   Date Value Ref Range Status   01/04/2019 1.5 0.0 - 2.5 % Final     MET-HEMOGLOBIN   Date Value Ref Range Status   01/04/2019 1.4 0.0 - 2.0 % Final     BASE EXCESS (BEP)   Date Value Ref Range Status   01/04/2019 3.0 -2.0 - 3.0 mEq/L Final     BASE DEFICIT   Date Value Ref Range Status   01/04/2019 1.1 0.0 - 3.0 mmol/L Final     BICARBONATE (ARTERIAL)   Date Value Ref Range Status   01/04/2019 24.0 18.0 - 26.0 mmol/L Final     HCO3 (HCO3P)   Date Value Ref Range Status   01/04/2019 29 (H) 22 - 26 mmol/L Final     Airway:* No LDAs found *  Blood pressure 134/71, pulse 67, temperature 36 C (96.8 F), resp. rate 15, height 1.676 m (_0 ), weight 101 kg (221 lb 9 oz), SpO2 100 %, not currently breastfeeding.

## 2021-03-13 NOTE — Anesthesia Preprocedure Evaluation (Addendum)
ANESTHESIA PRE-OP EVALUATION  Planned Procedure: ARTHROPLASTY HIP ANTERIOR TOTAL MAKO ROBOTIC ASSISTED (Right Hip)  Review of Systems     Physical Assessment      Airway         TM distance: >3 FB    Neck ROM: full              Dental           (+) caps, chipped           Pulmonary    Breath sounds clear to auscultation       Cardiovascular    Rhythm: regular  Rate: Normal       Other findings            Plan  ASA 3     Planned anesthesia type: general                                   Patient's NPO status is appropriate for Anesthesia.

## 2021-03-13 NOTE — Anesthesia Postprocedure Evaluation (Signed)
Anesthesia Post Op Evaluation    [name]  Procedure(s) with comments:  ARTHROPLASTY HIP ANTERIOR TOTAL MAKO ROBOTIC ASSISTED - SS    Last Vitals:Temperature: 36 C (96.8 F) (03/13/21 1330)  Heart Rate: 69 (03/13/21 1400)  BP (Non-Invasive): 115/72 (03/13/21 1400)  Respiratory Rate: 13 (03/13/21 1400)  SpO2: 94 % (03/13/21 3762)    No complications documented.    Patient is sufficiently recovered from the effects of anesthesia to participate in the evaluation and has returned to their pre-procedure level.  Patient location during evaluation: bedside       Patient participation: complete - patient participated  Level of consciousness: awake    Pain management: adequate  Airway patency: patent    Anesthetic complications: no  Cardiovascular status: stable  Respiratory status: room air  Hydration status: stable  Patient post-procedure temperature: Pt Normothermic   PONV Status: Absent

## 2021-03-13 NOTE — H&P (Signed)
[facility]  H&P Update Form    [name], [name], 65 y.o. female  Encounter Start Date:  03/13/2021  Inpatient Admission Date: 03/13/2021  Date of Birth:  [birth year]    03/13/2021    STOP: IF H&P IS GREATER THAN 30 DAYS FROM SURGICAL DAY COMPLETE NEW H&P IS REQUIRED.     H & P updated the day of the procedure.  1.  H&P completed within 30 days of surgical procedure and has been reviewed within 24 hours of admission but prior to surgery or a procedure requiring anesthesia services by [name] on 02/19/21 , the patient has been examined, and no change has occured in the patients condition since the H&P was completed.       Change in medications: No          Last Menstrual Period: Post-Menopausal      Comments:     2. COVID: Negative    3.  Patient continues to be appropiate candidate for planned surgical procedure. YES      [name] Hey, MD

## 2021-03-13 NOTE — OR Surgeon (Addendum)
PATIENT NAME: [name] NUMBER: [id number]  DATE OF SERVICE: 03/13/2021  DATE OF BIRTH: 05/04/1956     OPERATIVE REPORT     PREOPERATIVE DIAGNOSIS:  right hip osteoarthritis.     POSTOPERATIVE DIAGNOSIS:  right hip osteoarthritis.     NAME OF PROCEDURE:  Mako robotic-assisted right direct anterior total hip arthroplasty.     SURGEON:  [name] Hey, MD.     ASSISTANTS:  Neita Carp, MD, First Assistant, with no available qualified orthopaedic resident, who helped to perform the surgical approach, acetabular and femoral preparation and assisted with trialing and wound closure.  ;  Katie Seifried, PA-C.     ANESTHESIA:  General.     ESTIMATED BLOOD LOSS:  350 mL.     FLUIDS:  700 mL crystalloid.     COMPLICATIONS:  None.     FINAL IMPLANTS:  1.           Stryker Trident II Tritanium 52 mm acetabular shell with a highly-crosslinked polyethylene liner.  2.           Stryker Accolade II, 127 degree, size 5 stem.    3.           Stryker Biolox delta ceramic 36 mm femoral head with a +0 mm neck.     INDICATIONS FOR PROCEDURE:  [name] is a patient referred to me for severe worsening hip pain over the past few years despite multiple conservative treatments.  Radiographs revealed end-stage osteoarthritis of the hip.  We discussed total hip arthroplasty in detail, including all the associated risks and complications. [name] understood and wished to proceed.     DESCRIPTION OF PROCEDURE:  [name] was identified in the preop holding area.  IV Vancomycin was started. The appropriate extremity was marked. [name] was taken back to the operating room where anesthesia was administered.  The patient was then sedated supine on a general use table.  All bony prominences were padded.  Ancef and TXA were given.  Both hips and legs were prepped and draped in the usual sterile fashion.  A surgical pause was performed.  We then placed our contralateral iliac crest pins through  stab incisions with good purchase in bone, followed by placement of our pelvic array.  We then made our operative hip incision starting 2 cm lateral to the ASIS.  Dissection was performed down to the fascia overlying the tensor fascia lata muscle.  This was then split in line with our incision.  The muscle belly was bluntly dissected free and retracted laterally.  The circumflex vessels were identified and coagulated.  Retractors were placed around the femoral neck.  We placed our femoral checkpoint and measured our pre-resection leg length and offset.  We then did our an anterior capsulotomy and this was tagged.  We then used the robotic probe as a ruler to measure for our neck cut and then our resection was made.  The head and neck were removed with threaded Steinmann pin.  Acetabular retractors were placed.  Residual labrum was removed.  We placed our acetabular checkpoint, registered this and our contralateral crest point.  We then registered the acetabulum with good accuracy and then robotically reamed line to line with good exposed bleeding bone.  The acetabular component was impacted in 40 degrees of abduction and 20 degrees of anteversion based off of the impingement simulation confirmed robotically.  This seated well with excellent fixation.  A crosslinked liner was placed.  We then  did a capsular release from the trochanter and mobilized the femur.  The bed was placed in extension and Trendelenburg.  We attached the Omni-Tract and placed our retractors.  A box osteotome was used to open the femur.  A canal finding reamer was used to establish the canal.  We then broached up to the templated size, which had excellent fill and stability in the femur.  We then trialed with excellent stability at about 10 degrees of hyperextension and 70 degrees of external rotation of the foot, as well as 90 degrees of flexion, 5 degrees of adduction, and almost 65 degrees of internal rotation of the lower leg without any  impingement or dislocation.  Our clinical and robotic leg length and robotic offset measurements were satisfactory.  I placed the real implants which sat at the level of our broach, followed by the femoral head, reduced the acetabulum, thoroughly irrigated, and then injected with a multimodal pain injection cocktail.  We repaired the capsule with 0 Vicryl, the fascia with 0 Quill, 2-0 Vicryl for the subcu tissues, and 2-0 Quill barbed suture and Exofin for the skin, followed by at [employer] and Mepilex dressing.  We then removed our contralateral pins, irrigated the stab incisions, closed those with buried Vicryl, Exofin, Suture Strip Plus, and a Mepilex dressing.  The drapes were then removed and Majesty Stehlin was awakened from anesthesia, placed on the hospital bed, and taken to the recovery room in stable condition.  I was present and performed all key and critical portions of the above mentioned procedure.     POSTOPERATIVE PLAN:  [name] will receive another dose of IV antibiotics and will be mobilized later on the day of surgery with physical therapy, weightbearing as tolerated, and likely be discharged home after a one day stay.      A 22-modifier was added due to the patients obesity with BMI of 36 which increased the surgical dissection, complexity and operative time for the case and carries increased risk.     [name] Hey, MD  Associate Professor   [name] Department of Orthopaedics

## 2021-03-13 NOTE — Care Plan (Signed)
[place]  Physical Therapy Initial Evaluation    Patient Name: [name]  Date of Birth: [birth year]  Height: Height: 167.8 cm (5' 6.06")  Weight: Weight: 101 kg (221 lb 12.5 oz)  Room/Bed: 21/A  Payor: MEDICARE / Plan: MEDICARE PART A AND B / Product Type: Medicare /     Assessment:      [name] was alert, pleasant, and cooperative with PT eval. She was somewhat limited in activity at baseline due to pain and had been using a cane for amb for the past 2 months. She was able to amb short community distances. She is now admitted POD 0 for R anterior THA. She has pain (more so at sites other than R hip), decreased strength R LE, joint stiffness R hip, and decreased stability in R stance. She was able to amb with FWW but with CGA and chair follow for safety. She has good home set up and good family support.  Anticipate good progress and return to home once medically stable for d/c.    Discharge Needs:   Equipment Recommendation: none anticipated  Discharge Disposition: home with assist      Plan:   Current Intervention: balance training, bed mobility training, gait training, home exercise program, patient/family education, stair training, strengthening, transfer training  To provide physical therapy services 1x/day, 2x/day, minimum of 5x/week  for duration of until discharge.    The risks/benefits of therapy have been discussed with the patient/caregiver and he/she is in agreement with the established plan of care.       Subjective & Objective        03/13/21 1905   Therapist Pager   PT Assigned/ Pager # Abigail Butts [phone]   Rehab Session   Document Type evaluation   Total PT Minutes: 31   Patient Effort good   Symptoms Noted During/After Treatment fatigue;increased pain   Symptoms Noted Comment back pain and muscle spasms L post thigh   General Information   Patient Profile Reviewed yes   Onset of Illness/Injury or Date of Surgery 03/13/21   Patient/Family/Caregiver  Comments/Observations Pt was alert and agreeable to PT eval. Daughter was at b/s and supportive.   Pertinent History of Current Functional Problem [name] is a 79 yof with a PMH of obesity, psoriasis, arotic stenosis s/p AVR, and OA now POD 0 for R anterior THA.   Medical Lines PIV Line   Respiratory Status room air   Existing Precautions/Restrictions fall precautions;full code;anterior hip precaution   General Observations: Pt was seen at b/s on 7ne for PT eval with permission of b/s nurse.   Weight-bearing Status   Extremity Weight-bearing Status right lower extremity   Right Lower Extremity weight-bearing as tolerated (WBAT)   Mutuality/Individual Preferences   Individualized Care Needs OOB/amb with FWW; R knee feels weak; WBAT and anterior hip precautions   Living Environment   Lives With child(ren), adult;grandchild(ren)   Living [place] is not walk in   [place] Pt lives in the finished basement of her daughter's home with daughter, son-in-law and grandchildren. She has ground level entrance. Tub shower.   Functional Level Prior   Ambulation 1 - assistive equipment   Transferring 1 - assistive equipment   Toileting 0 - independent   Bathing 0 - independent   Dressing 0 - independent   Eating 0 - independent   Prior Functional Level Comment Pt has been using a cane for amb  for ~ 2 months. Still able to drive. Able to amb short community distances; leans on cart at grocery store. She was standing to bathe but having some difficulty with lower body dressing.   Self-Care   Current Activity Tolerance moderate   Equipment Currently Used at Home yes   Equipment Currently Used at Home cane, straight;raised toilet;shower chair;walker, front wheeled  (arm rests on raised toilet)   Pre Treatment Status   Pre Treatment Patient Status Patient supine in bed;Call light within reach;Telephone within reach;Sitter select activated;Venodynes in place and  activated   Support Present Pre Treatment  Nurse present;Clinical assistant present;Family present   Communication Pre Treatment  Nurse   Cognitive Assessment/Interventions   Behavior/Mood Observations behavior appropriate to situation, WNL/WFL;alert;cooperative   Attention WNL/WFL   Follows Commands WNL   Pain Assessment   Pretreatment Pain Rating 5/10   Posttreatment Pain Rating 6/10   Pre/Posttreatment Pain Comment Pain mostly in lower back and post L thigh. 2/10 R hip. Nurse aware and discussing meds with pt at end of eval.   RUE Assessment   RUE Assessment WFL- Within Functional Limits   LUE Assessment   LUE Assessment WFL- Within Functional Limits   RLE Assessment   RLE Assessment X-Exceptions   RLE ROM AAROM of R hip WFL, AROM of knee and ankle [facility]   RLE Strength hip < 3/5, knee 3/5, ankle 4/5; R knee feeling weak during gt   LLE Assessment   LLE Assessment WFL- Within Functional Limits   Mobility Assessment/Training   Comment Pt was instructed in anterior hip precautions and given handout. She was assisted to sitting EOB. Stood to [facility] and ambulated in hall. Up in chair at b/s at end of eval.   Bed Mobility Assessment/Treatment   Bed Mobility, Assistive Device Head of Bed Elevated  (pt sleeps on a wedge at home)   Supine-Sit Independence minimum assist (75% patient effort)   Sit to Supine, Independence not tested   Safety Issues decreased use of legs for bridging/pushing;decreased use of arms for pushing/pulling   Impairments pain;strength decreased   Comment Pt needed min A to bring R LE over EOB. Had difficulty pushing through L hand to scoot toward EOB due to pain at IV site.   Transfer Assessment/Treatment   Sit-Stand Independence contact guard assist;verbal cues required   Stand-Sit Independence contact guard assist;verbal cues required   Sit-Stand-Sit, Assist Device walker, front wheeled   Transfer Safety Issues weight-shifting ability decreased   Transfer Impairments pain;strength decreased   Transfer  Comment Pt was cued for hand placement and had difficulty using L hand to help due to pain at IV site.   Gait Assessment/Treatment   Total Distance Ambulated 170   Independence  contact guard assist;1 person + 1 person to manage equipment   Assistive Device  walker, front wheeled   Distance in Feet 170 ft   Gait Speed slow   Deviations  limb motion velocity decreased;double stance time increased;cadence decreased;step length decreased;stride length decreased;weight-shifting ability decreased;swing-to-stance ratio decreased   Maintain Weight Bearing Status able to maintain   Safety Issues  weight-shifting ability decreased;step length decreased   Impairments  pain;strength decreased   Comment R LE was weak in stance with mild buckling at knee at times. UE support through [place] was inhibited by pain in L hand from IV. Chair follow for safety. Pt took multiple brief standing rest breaks.   Balance Skill Training   Comment with UE support in standing   Sitting  Balance: Static fair + balance   Sitting, Dynamic (Balance) fair - balance   Sit-to-Stand Balance fair - balance   Standing Balance: Static fair - balance   Standing Balance: Dynamic fair - balance   Systems Impairment Contributing to Balance Disturbance musculoskeletal   Identified Impairments Contributing to Balance Disturbance pain;decreased strength   Therapeutic Exercise/Activity   Comment PT reviewed anterior hip precautions and provided handout. Discussed importance of following post op exer as laid out in play book.   Post Treatment Status   Post Treatment Patient Status Patient sitting in bedside chair or w/c;Call light within reach;Telephone within reach;Sitter select activated;Venodynes in place and activated   Support Present Post Treatment  Nurse present;Family present   Communication Post Treatement Nurse;Charge Nurse   Plan of Care Review   Plan Of Care Reviewed With patient;daughter   Basic Mobility Am-PAC/6Clicks Score (APPROVED PT Staff, [facility] PT/OT  and [place])   Turning in bed without bedrails 3   Lying on back to sitting on edge of flat bed 3   Moving to and from a bed to a chair 3   Standing up from chair 3   Walk in room 3   Climbing 3-5 steps with railing 2   6 Clicks Raw Score total 17   Standardized (t-scale) score 39.67   CMS 0-100% Score 43.83   CMS Modifier CK   Patient Mobility Goal (JHHLM) 5- Stand 1 minute or more 3X/day   Exercise/Activity Level Performed 7- Walked 25 feet or more   Physical Therapy Clinical Impression   Assessment [name] was alert, pleasant, and cooperative with PT eval. She was somewhat limited in activity at baseline due to pain and had been using a cane for amb for the past 2 months. She was able to amb short community distances. She is now admitted POD 0 for R anterior THA. She has pain (more so at sites other than R hip), decreased strength R LE, joint stiffness R hip, and decreased stability in R stance. She was able to amb with FWW but with CGA and chair follow for safety. She has good home set up and good family support.  Anticipate good progress and return to home once medically stable for d/c.   Patient/Family Goals Statement less pain   Criteria for Skilled Therapeutic yes;meets criteria;skilled treatment is necessary   Pathology/Pathophysiology Noted musculoskeletal   Impairments Found (describe specific impairments) gait, locomotion, and balance;muscle performance;joint integrity and mobility   Functional Limitations in Following  home management;community/leisure;self-care   Disability: Inability to Perform community/leisure   Rehab Potential good, to achieve stated therapy goals   Therapy Frequency 1x/day;2x/day;minimum of 5x/week   Predicted Duration of Therapy Intervention (days/wks) until discharge   Anticipated Equipment Needs at Discharge (PT) none anticipated   Anticipated Discharge Disposition home with assist   Evaluation Complexity Justification   Patient History: Co-morbidity/factors that  impact Plan of Care Obesity: causing adverse impact on function;Surgical procedure: causing pain &/or impaired function;1-2 that impact Plan of Care   Examination Components Range of motion;Strength;Balance;Bed mobility;Transfers;Ambulation;1-2 Exam elements addressed   Presentation Evolving: Symptoms, complaints, characteristics of condition changing &/or cognitive deficits present   Clinical Decision Making Low complexity   Evaluation Complexity Moderate complexity   Care Plan Goals   PT Rehab Goals Bed Mobility Goal;Gait Training Goal;Stairs Training Goal;Transfer Training Goal   Bed Mobility Goal   Bed Mobility Goal, Date Established 03/13/21   Bed Mobility Goal, Time to Achieve by discharge  Bed Mobility Goal, Activity Type roll left/roll right;supine to sit/sit to supine   Bed Mobility Goal, Independence Level modified independence   Bed Mobility Goal, Assistive Device leg lifter;other (see comments)  (HOB elevated as pt has wedge at home)   Gait Training  Goal, Distance to Achieve   Gait Training  Goal, Date Established 03/13/21   Gait Training  Goal, Time to Achieve by discharge   Gait Training  Goal, Independence Level modified independence   Gait Training  Goal, Assist Device walker, rolling   Gait Training  Goal, Distance to Achieve 200 ft   Stairs Training Goal   Stairs Training Goal, Date Established 03/13/21   Stairs Training Goal, Time to Achieve by discharge   Stairs Training Goal, Independence Level contact guard assist   Stairs Training Goal, Assist Device handrail, left;handrail, right   Stairs Training Goal, Number of Stairs to Achieve 3   Transfer Training Goal   Transfer Training Goal, Date Established 03/13/21   Transfer Training Goal, Time to Achieve by discharge   Transfer Training Goal, Activity Type bed-to-chair/chair-to-bed;sit-to-stand/stand-to-sit   Transfer Training Goal, Independence Level modified independence   Transfer Training Goal, Assist Device walker, rolling   Planned Therapy  Interventions, PT Eval   Planned Therapy Interventions (PT) balance training;bed mobility training;gait training;home exercise program;patient/family education;stair training;strengthening;transfer training       Therapist:   Norlene Duel, PT   Pager #: [phone]

## 2021-03-14 LAB — CBC
HCT: 30.1 % — ABNORMAL LOW (ref 34.8–46.0)
HGB: 10.2 g/dL — ABNORMAL LOW (ref 11.5–16.0)
MCH: 30.3 pg (ref 26.0–32.0)
MCHC: 33.9 g/dL (ref 31.0–35.5)
MCV: 89.3 fL (ref 78.0–100.0)
MPV: 8.6 fL — ABNORMAL LOW (ref 8.7–12.5)
PLATELETS: 178 10*3/uL (ref 150–400)
RBC: 3.37 10*6/uL — ABNORMAL LOW (ref 3.85–5.22)
RDW-CV: 11.9 % (ref 11.5–15.5)
WBC: 11.5 10*3/uL — ABNORMAL HIGH (ref 3.7–11.0)

## 2021-03-14 LAB — POC BLOOD GLUCOSE (RESULTS)
GLUCOSE, POC: 130 mg/dl — ABNORMAL HIGH (ref 70–105)
GLUCOSE, POC: 135 mg/dl — ABNORMAL HIGH (ref 70–105)

## 2021-03-14 LAB — BASIC METABOLIC PANEL
ANION GAP: 8 mmol/L (ref 4–13)
BUN/CREA RATIO: 21 (ref 6–22)
BUN: 13 mg/dL (ref 8–25)
CALCIUM: 8.4 mg/dL — ABNORMAL LOW (ref 8.8–10.2)
CHLORIDE: 100 mmol/L (ref 96–111)
CO2 TOTAL: 23 mmol/L (ref 23–31)
CREATININE: 0.61 mg/dL (ref 0.60–1.05)
ESTIMATED GFR: >90 mL/min/BSA (ref >=60)
GLUCOSE: 117 mg/dL (ref 65–125)
POTASSIUM: 4 mmol/L (ref 3.5–5.1)
SODIUM: 131 mmol/L — ABNORMAL LOW (ref 136–145)

## 2021-03-14 MED ORDER — CELECOXIB 200 MG CAPSULE
200.0000 mg | ORAL_CAPSULE | Freq: Every day | ORAL | 0 refills | Status: DC
Start: 2021-03-14 — End: 2022-02-03

## 2021-03-14 MED ORDER — ETHYL ALCOHOL 62 % (NOZIN NASAL SANITIZER) NASAL SOLUTION - BULK BOTTLE
1.0000 | Freq: Three times a day (TID) | NASAL | Status: AC
Start: 2021-03-14 — End: 2021-03-28

## 2021-03-14 NOTE — Progress Notes (Signed)
[facility]  Adult [facility] Service  Progress Note  03/14/2021    Name: [name]  DOB: [birth year]  MRN: [id number]     Interval History:  Hb 10.2. Complaining of "sciatica" on the left side which is chronic for her. Hip pain well controlled. DC to home today as long as she continues to do well w/ PT     Physical Exam:  Right LE  -dressing in place, CDI  -motor intact grossly toe dorsi/plantar flexion, ankle dorsi/plantar flexion  -SILT deep peroneal/superficial peroneal/tibial/saphenous/sural distributions  -cap refill <2, DP 2+             LABS:  Hemogram   Lab Results   Component Value Date/Time    WBC 11.5 (H) 03/14/2021 03:32 AM    HGB 10.2 (L) 03/14/2021 03:32 AM    HCT 30.1 (L) 03/14/2021 03:32 AM    PLTCNT 178 03/14/2021 03:32 AM    RBC 3.37 (L) 03/14/2021 03:32 AM    MCV 89.3 03/14/2021 03:32 AM    MCHC 33.9 03/14/2021 03:32 AM    MCH 30.3 03/14/2021 03:32 AM    RDW 12.2 08/14/2016 04:35 AM    MPV 8.6 (L) 03/14/2021 03:32 AM        Basic Metabolic Profile    Lab Results   Component Value Date/Time    SODIUM 131 (L) 03/14/2021 03:32 AM    POTASSIUM 4.0 03/14/2021 03:32 AM    CHLORIDE 100 03/14/2021 03:32 AM    CO2 23 03/14/2021 03:32 AM    ANIONGAP 8 03/14/2021 03:32 AM    Lab Results   Component Value Date/Time    BUN 13 03/14/2021 03:32 AM    CREATININE 0.61 03/14/2021 03:32 AM    GLUCOSENF 117 03/14/2021 03:32 AM              A & P:  65 y.o. female s/p R THA on 5/26 w/ Dr. Sharlene Motts      Pain control   WBAT  Anterior Hip precautions  DVT ppx: ASA and Coumadin  PT/OT: recs DC to home  Consults: [facility], appreciate med mgmt  Dispo planning to home today    Gracelyn Nurse, MD  03/13/2021, 12:34

## 2021-03-14 NOTE — Care Management Notes (Signed)
[facility] Management Initial Evaluation    Patient Name: [name]  Date of Birth: [birth year]  Sex: female  Date/Time of Admission: 03/13/2021  7:52 AM  Room/Bed: 21/A  Payor: MEDICARE / Plan: MEDICARE PART A AND B / Product Type: Medicare /   Primary Care Providers:  Derek Jack, APRN,FNP-B* (General)    Pharmacy Info:   Preferred Pharmacy       [place], [address]    [place] [address]    Phone: [phone] Fax: [phone]    Hours: Not open 24 hours    [facility] [id number] - [address]    [place] [address]    Phone: [phone] Fax: [phone]    Hours: Not open 24 hours    [place], [address]    [place] [address]    Phone: [phone] Fax: [phone]    Hours: Not open 24 hours          Emergency Contact Info:   Extended Emergency Contact Information  Primary Emergency Contact: Allie [place]  Mobile Phone: [phone]  Relation: Daughter    History:   Sabrea Sankey is a 65 y.o., female, admitted with Primary osteoarthritis of right hip.    Height/Weight: 167.8 cm (5' 6.06") / 101 kg (221 lb 12.5 oz)     LOS: 1 day   Admitting Diagnosis: Primary osteoarthritis of right hip [M16.11]    Assessment:      03/14/21 0951   Assessment Details   Assessment Type Admission   Date of Care Management Update 03/14/21   Date of Next DCP Update 03/17/21   Insurance Information/Type   Insurance type Medicare   Employment/Financial   Patient has Prescription Coverage?  Yes   Living Environment   Lives With child(ren), adult;grandchild(ren)   Living Arrangements house   Able to Return to Prior Arrangements yes   Home Safety   Home Assessment: No Problems Identified   Home Accessibility no concerns;stairs within  home;bed and bath on same level   Care Management Plan   Discharge Planning Status initial meeting   Projected Discharge Date 03/14/21   Discharge plan discussed with: Patient   CM will evaluate for rehabilitation potential yes   Patient choice offered to patient/family no   Form for patient choice reviewed/signed and on chart no   Discharge Needs Assessment   Equipment Currently Used at Home cane, straight;walker, front wheeled;shower chair;commode  (arm rest on raised toilet)   Equipment Needed After Discharge none   Discharge Facility/Level of Care Needs Home (Patient/Family Member/other)(code 1)   Transportation Available car;family or friend will provide   Referral Information   Admission Type inpatient   Address Verified verified-no changes   Arrived From home or self-care;admitted as an inpatient   ADVANCE DIRECTIVES   Does the Patient have an Advance Directive? Yes, Patient Does Have Advance Directive for Healthcare Treatment   Type of Advance Directive Completed Medical Power of Attorney;Medical Living Will   Copy of Advance Directives in Chart? 6   Patient Requests Assistance in Having Advance Directive Notarized. N/A       Discharge Plan:  Home (Patient/Family Member/other) (code 1)  IP consult received for discharge needs. Patient resides with her daughter/son-i-law  and grandchildren in a 3 story home. Patient stays on the 1st level basement apartment, Bed/bath on that level, 0 entry steps. No Home Health services prior to admission.  MSW confirmed address, pharmacy, and PCP information. Patient has prescription coverage and transportation home upon discharge via daughter.     The patient will continue to be evaluated for developing discharge needs.     Case Manager: Antoine Poche, [place]  Phone: [phone]

## 2021-03-14 NOTE — Care Plan (Signed)
[facility]  Physical Therapy Progress Note      Patient Name: [name]  Date of Birth: 03/19/1956  Height:  167.8 cm (5' 6.06")  Weight:  101 kg (221 lb 12.5 oz)  Room/Bed: 21/A  Payor: MEDICARE / Plan: MEDICARE PART A AND B / Product Type: Medicare /     Assessment:     [name] was pleasant and agreeable to PT tx. Her R hip pain is well controlled but she is having sciatica pain in her L hip today. R LE was stable in stance. She ambulated well with FWW and was able to negotiate steps without difficulty. No concerns for d/c home with respect to PT/mobility.    Discharge Needs:   Equipment Recommendation: none anticipated  Discharge Disposition: home with assist    Plan:   Continue to follow patient according to established plan of care.  The risks/benefits of therapy have been discussed with the patient/caregiver and he/she is in agreement with the established plan of care.     Subjective & Objective:        03/14/21 1020   Therapist Pager   PT Assigned/ Pager # Abigail Butts [phone]   Rehab Session   Document Type therapy progress note (daily note)   Total PT Minutes: 27   Patient Effort good   Symptoms Noted During/After Treatment increased pain   Symptoms Noted Comment states she is now having sciatica symptoms L LE.   General Information   Patient Profile Reviewed yes   Patient/Family/Caregiver Comments/Observations Pt was alert and agreeable to PT tx. States no cocerns for d/c home. Did not feel any instability in R LE when up with nursing this morning.   Medical Lines PIV Line   Respiratory Status room air   Existing Precautions/Restrictions fall precautions;full code;anterior hip precaution   General Observations: Pt was seen at b/s on 7ne for PT tx with permission of b/s nurse.   Weight-bearing Status   Right Lower Extremity weight-bearing as tolerated (WBAT)   Mutuality/Individual Preferences   Individualized Care Needs OOB with FWW and assist x 1; WBAT and anterior  hip precautions   Living Environment   Living Environment Comment 13 steps to main level of her daughter's home with HR on R.   Pre Treatment Status   Pre Treatment Patient Status Patient supine in bed;Call light within reach;Telephone within reach;Sitter select activated;Venodynes in place and activated   Support Present Pre Treatment  None   Communication Pre Treatment  Nurse   Cognitive Assessment/Interventions   Behavior/Mood Observations behavior appropriate to situation, WNL/WFL;alert;cooperative   Attention WNL/WFL   Follows Commands WNL   Pain Assessment   Pretreatment Pain Rating 6/10   Posttreatment Pain Rating 5/10   Pre/Posttreatment Pain Comment Pain mostly L  hip; R hip only 1/10   Mobility Assessment/Training   Additional Documentation Stairs Assessment/Treatment (Group)   Comment Pt was provided with stretch and manual therapy to L piriformis due to sciatica. Transferred to sitting EOB. Ambualted in hall with [place] and up/down stairs with HR and cane. Up in chair at b/s at end of tx.   Bed Mobility Assessment/Treatment   Bed Mobility, Assistive Device Head of Bed Elevated  (pt has a wedge at home)   Supine-Sit Independence contact guard assist;verbal cues required   Sit to Supine, Independence contact guard assist;verbal cues required   Safety Issues decreased use of legs for bridging/pushing   Impairments pain;strength decreased   Comment Pt was able  to push R LE over EOB but scooting her hips. She was able lift LEs together back into bed. She has leg lifter but did not need it; has been using it to help stretch L LE.   Transfer Assessment/Treatment   Sit-Stand Independence stand-by assistance;verbal cues required   Stand-Sit Independence stand-by assistance;verbal cues required   Sit-Stand-Sit, Assist Device walker, front wheeled   Transfer Safety Issues weight-shifting ability decreased   Transfer Impairments pain;strength decreased   Transfer Comment Pt is still having difficulty using L hand to  push to stand due to pain from IVs.   Gait Assessment/Treatment   Total Distance Ambulated 200   Independence  stand-by assistance   Assistive Device  walker, front wheeled   Distance in Feet 200 ft   Gait Speed mildy decreased   Deviations  limb motion velocity decreased;double stance time increased;cadence decreased;step length decreased;stride length decreased;weight-shifting ability decreased;swing-to-stance ratio decreased   Maintain Weight Bearing Status able to maintain   Safety Issues  weight-shifting ability decreased;step length decreased   Impairments  pain;strength decreased   Comment R LE was stable in stance but mildly antalgic. Pt was able to amb with step through gt pattern.   Stairs Assessment/Treatment   Number of Stairs 3 steps x 2 reps   Impairments pain;strength decreased   Assistive Device straight cane   Comment Verbal cues for sequencing. Pt has been leading down with L LE first at home and was ackward attempting to switch to lead down with R LE first. She did to the last step down L LE first with slight decreased control of eccentric movement in R stance vs L.   Handrail Location right side (ascending)   Independence Level stand-by assistance;verbal cues required   Safety Issues weight-shifting ability decreased;sequencing ability decreased   Technique Used step to step (ascending);step to step (descending)   Maintain Weight Bearing Status able to maintain weight bearing status   Balance Skill Training   Comment with UE support in standing   Sitting Balance: Static fair + balance   Sitting, Dynamic (Balance) fair balance   Sit-to-Stand Balance fair balance   Standing Balance: Static fair balance   Standing Balance: Dynamic fair - balance   Systems Impairment Contributing to Balance Disturbance musculoskeletal   Identified Impairments Contributing to Balance Disturbance pain;decreased strength   Therapeutic Exercise/Activity   Comment PT performed manual therapy for massage/pressure to L  piriformis and assisted pt with piriformis stretch.   Post Treatment Status   Post Treatment Patient Status Patient sitting in bedside chair or w/c;Call light within reach;Telephone within reach;Sitter select activated;Venodynes in place and activated   Support Present Post Treatment  None   Communication Post Treatement Nurse   Plan of Care Review   Plan Of Care Reviewed With patient   Basic Mobility Am-PAC/6Clicks Score (APPROVED PT Staff, [facility] PT/OT and [facility] Nursing ONLY)   Turning in bed without bedrails 3   Lying on back to sitting on edge of flat bed 3   Moving to and from a bed to a chair 3   Standing up from chair 3   Walk in room 3   Climbing 3-5 steps with railing 3   6 Clicks Raw Score total 18   Standardized (t-scale) score 41.05   CMS 0-100% Score 40.47   CMS Modifier CK   Patient Mobility Goal (JHHLM) 6- Walk 10 steps or more 2X/day   Exercise/Activity Level Performed 7- Walked 25 feet or more   Physical Therapy  Clinical Impression   Assessment [name] was pleasant and agreeable to PT tx. Her R hip pain is well controlled but she is having sciatica pain in her L hip today. R LE was stable in stance. She ambulated well with FWW and was able to negotiate steps without difficulty. No concerns for d/c home with respect to PT/mobility.   Anticipated Equipment Needs at Discharge (PT) none anticipated   Anticipated Discharge Disposition home with assist       Therapist:   Norlene Duel, PT   Pager #: [phone]

## 2021-03-14 NOTE — Nurses Notes (Signed)
Patient discharged home with family.  AVS reviewed with patient/care giver.  A written copy of the AVS and discharge instructions was given to the patient/care giver.  Questions sufficiently answered as needed.  Patient/care giver encouraged to follow up with PCP as indicated.  In the event of an emergency, patient/care giver instructed to call 911 or go to the nearest emergency room.   Patient awaiting the arrival of her daughter for discharge home

## 2021-03-14 NOTE — Care Plan (Signed)
[facility]  Occupational Therapy Initial Evaluation    Patient Name: [name]  Date of Birth: 16-Dec-1955  Height: Height: 167.8 cm (5' 6.06")  Weight: Weight: 101 kg (221 lb 12.5 oz)  Room/Bed: 21/A  Payor: MEDICARE / Plan: MEDICARE PART A AND B / Product Type: Medicare /     Assessment:   Ms. Hackenberg performed very well during OT evaluation this date. Pt presents POD 1 s/p R anterior THA; WBAT RLE with anterior hip precautions. Reviewed hip precautions via education and visual handout with good understanding and carryover. With intermittent cues for sequencing and compensatory techniques, pt completed functional STS transfers, seated/standing UB/LB dressing tasks, tub transfer via bath bench, ambulation of greater than household distance with use of FWW, and bed mobility via leg lifter with SBA-CGA provided for balance, steadying secondary to mild instability and post-op pain/discomfort. From OT perspective, anticipate safe d/c home with assist from family once medically appropriate with no current DME needs. OT will sign off at this time as pt has discharged from this facility.      Discharge Needs:   Equipment Recommendation: none anticipated    Discharge Disposition: home with assist    Plan:   Current Intervention:  Patient has discharged from this facility.    Subjective & Objective        03/14/21 1135   Therapist Pager   OT Assigned/ Pager # Gianina Olinde [phone]   Rehab Session   Document Type evaluation   Total OT Minutes: 21   Patient Effort good   Symptoms Noted During/After Treatment increased pain   General Information   Patient Profile Reviewed yes   Onset of Illness/Injury or Date of Surgery 03/13/21   Pertinent History of Current Functional Problem POD 1 s/p R anterior THA; WBAT RLE with anterior hip precautions   Medical Lines PIV Line   Respiratory Status room air   Existing Precautions/Restrictions full code;fall precautions;anterior hip precaution;weight  bearing restriction  (WBAT RLE)   Pre Treatment Status   Pre Treatment Patient Status Patient sitting in bedside chair or w/c;Call light within reach;Telephone within reach;Sitter select activated;Nurse approved session;Venodynes in place and activated   Support Present Pre Treatment  None   Communication Pre Treatment  Nurse   Mutuality/Individual Preferences   Individualized Care Needs OOB with FWW and assist x1; WBAT RLE with anterior hip precautions   Patient-Specific Goals (Include Timeframe) To go home today   Plan of Care Reviewed With patient   Living Environment   Lives With child(ren), adult;grandchild(ren)   Living Arrangements house   Home Assessment: No Problems Identified   Home Accessibility tub/shower is not walk in;bed and bath on same level   Transportation Available car;family or friend will provide   Living Environment Comment Pt lives in the finished basement of her daughter's home with daughter, son-in-law, and grandchildren with a ground level entrance.   Functional Level Prior   Ambulation 1 - assistive equipment   Transferring 1 - assistive equipment   Toileting 1 - assistive equipment   Bathing 1 - assistive equipment   Dressing 1 - assistive equipment   Eating 0 - independent   Communication 0 - understands/communicates without difficulty   Prior Functional Level Comment Prior to admission, pt was utilizing a straight cane for mobility within the home. Also owns a FWW. She is mod (I) with her basic ADL routine, completing toileting with raised toilet seat, bathing within tub/shower combo with bath bench present, and  dressing with long handled dressing equipment.   Self-Care   Equipment Currently Used at Home yes   Equipment Currently Used at [employer], straight;bath bench;raised toilet;walker, rolling   Equipment Brought to Hospital none   Vital Signs   O2 Delivery Pre Treatment room air   O2 Delivery Post Treatment room air   Pain Assessment   Pretreatment Pain Rating 6/10   Posttreatment  Pain Rating 5/10   Coping/Psychosocial   Observed Emotional State calm;cooperative   Verbalized Emotional State acceptance   Coping/Psychosocial Response Interventions   Plan Of Care Reviewed With patient   Cognitive Assessment/Interventions   Behavior/Mood Observations alert;behavior appropriate to situation, WNL/WFL;cooperative   Orientation Status oriented x 4   Attention WNL/WFL   Follows Commands WNL;WFL   RUE Assessment   RUE Assessment WFL- Within Functional Limits   LUE Assessment   LUE Assessment WFL- Within Functional Limits   Mobility Assessment/Training   Mobility Comment Pt ambulated functional mobility distance of 100 feet with use of FWW, SBA provided for safety and no overt LOB or unsteadiness noted throughout.   Right Lower Extremity weight-bearing as tolerated (WBAT)   Bed Mobility Assessment/Treatment   Bed Mobility, Assistive Device leg lifter   Supine-Sit Independence not tested   Sit to Supine, Independence stand-by assistance;verbal cues required;nonverbal cues required (demo/gesture)   Safety Issues decreased use of legs for bridging/pushing   Impairments flexibility decreased;pain;ROM decreased;strength decreased   Comment Utilized leg lifter to assist with lateral management of BLEs into bed.   Transfer Assessment/Treatment   Sit-Stand Independence stand-by assistance   Stand-Sit Independence stand-by assistance   Sit-Stand-Sit, Assist Device walker, front wheeled   Transfer Safety Issues balance decreased during turns;step length decreased;weight-shifting ability decreased   Transfer Impairments balance impaired;endurance;flexibility decreased;pain;ROM decreased;strength decreased   ADL Assessment/Intervention   ADL Comments tub transfer, seated/standing UB/LB dressing tasks   Bathing Assessment/Training   Assistive Devices tub bench   Position sitting;standing   Independence Level contact guard assist;verbal cues required;nonverbal cues required (demo/gesture)   Comment With cues for  proper sequencing and compensatory techniques to maintain anterior hip precautions, pt completed tub transfer via bath bench with increased time and effort required and CGA to assist with RLE.   Upper Body Dressing Assessment/Training   Position  sitting   DRESSING ASSESSED Don Shirt-pull over;Drinda Butts   Independence Level  set up required   Lower Body Dressing Assessment/Training   Position sitting;standing   DRESSING ASSESSED Don Pants- pull up;Don Pants- fasten;Don Underwear   Independence Level  contact guard assist;verbal cues required   Impairments activity tolerance impaired;balance impaired;flexibility decreased;pain;ROM decreased;strength decreased   Comment With cues provided for proper sequencing and compensatory techniques, pt completed donning of underwear and pants while seated/standing at bedside chair with CGA provided for steadying while standing to secure around waist. Encouraged pt to utilize her reacher at home if having difficulty. Also issued pt a new sock aid.   Motor Skills/Interventions   Additional Documentation Functional Endurance (Group)   Information systems manager   Comment with use of FWW in standing   Sitting Balance: Static fair + balance   Sitting, Dynamic (Balance) fair + balance   Sit-to-Stand Balance fair balance   Standing Balance: Static fair balance   Standing Balance: Dynamic fair balance   Systems Impairment Contributing to Balance Disturbance musculoskeletal   Identified Impairments Contributing to Balance Disturbance pain;decreased ROM;decreased strength   Therapeutic Exercise/Activity   Comment Reviewed anterior hip precautions via education and visual handout with  good understanding and carryover.   Functional Endurance Training   Comment, Functional Endurance fair +   Post Treatment Status   Post Treatment Patient Status Patient sitting in bedside chair or w/c;Call light within reach;Telephone within reach;Sitter select activated   Support Present Post Treatment  None    Financial trader Nurse   Clinical Impression   Functional Level at Time of Session Ms. Rossano performed very well during OT evaluation this date. Pt presents POD 1 s/p R anterior THA; WBAT RLE with anterior hip precautions. Reviewed hip precautions via education and visual handout with good understanding and carryover. With intermittent cues for sequencing and compensatory techniques, pt completed functional STS transfers, seated/standing UB/LB dressing tasks, tub transfer via bath bench, ambulation of greater than household distance with use of FWW, and bed mobility via leg lifter with SBA-CGA provided for balance, steadying secondary to mild instability and post-op pain/discomfort. From OT perspective, anticipate safe d/c home with assist from family once medically appropriate with no current DME needs. OT will sign off at this time as pt has discharged from this facility.   Therapy Frequency Evaluation Only   Predicted Duration of Therapy evaluation only   Anticipated Equipment Needs at Discharge none anticipated   Anticipated Discharge Disposition home with assist   Highest level of Mobility score   Exercise/Activity Level Performed 7- Walked 25 feet or more   Evaluation Complexity Justification   Occupational Profile Review Expanded review   Performance Deficits 3-5 deficits;Pain;Mobility;Balance;Endurance;Strength   Clinical Decision Making Moderate analytic complexity   Evaluation Complexity Moderate   Discharge Summary, OT Eval   Reason for Discharge patient discharged from this facility       Therapist:   Roosvelt Harps, MOT, OTR/L   Pager #: [phone]

## 2021-03-14 NOTE — Discharge Summary (Signed)
[place] [facility]  DISCHARGE SUMMARY  DATE OF SERVICE: 03/14/2021    [facility]  [place], [place] [address]  O: [phone]  F: [phone]      PATIENT NAME:  [name], [name]  MRN:  [id number]  DOB:  [birth year]    ENCOUNTER DATE:  03/13/2021  INPATIENT ADMISSION DATE: 03/13/2021  DISCHARGE DATE:  03/14/2021    ATTENDING PHYSICIAN: [name], *  SERVICE: Steva Colder  PRIMARY CARE PHYSICIAN: Derek Jack, APRN,FNP-BC       PRIMARY DISCHARGE DIAGNOSIS:    Active Hospital Problems    Diagnosis Date Noted   . Primary osteoarthritis of right hip [M16.11] 03/13/2021      Resolved Hospital Problems   No resolved problems to display.     Active Non-Hospital Problems    Diagnosis Date Noted   . 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 Postoperative atrial fibrillation (CMS HCC) 11/15/2020   . Osteoarthritis of right hip 11/14/2020   . S/P AVR (aortic valve replacement) 01/13/2019   . Severe aortic stenosis 11/08/2018   . Low back pain radiating to left leg 12/02/2016   . S/P hip replacement 08/13/2016   . Essential hypertension 02/12/2016        DISCHARGE MEDICATIONS:     Current Discharge Medication List      START taking these medications.      Details   alcohol 62% Solution  Commonly known as: NOZIN NASAL SANITIZER   1 Each, Each Nostril, EVERY 8 HOURS (SCHEDULED)  Refills: 0     celecoxib 200 mg Capsule  Commonly known as: CELEBREX   200 mg, Oral, DAILY  Qty: 30 Capsule  Refills: 0     ondansetron 4 mg Tablet  Commonly known as: ZOFRAN   4 mg, Oral, EVERY 6 HOURS PRN  Qty: 20 Tablet  Refills: 0     oxyCODONE 5 mg Tablet  Commonly known as: ROXICODONE   5-10 mg, Oral, EVERY 4 HOURS PRN  Qty: 42 Tablet  Refills: 0     sennosides-docusate sodium 8.6-50 mg Tablet  Commonly known as: SENOKOT-S   1 Tablet, Oral, 2 TIMES DAILY  Qty: 60 Tablet  Refills: 0        CONTINUE these medications -  NO CHANGES were made during your visit.      Details   acetaminophen 500 mg Tablet  Commonly known as: TYLENOL   650 mg, Oral, NIGHTLY, Taking 2 daily  Refills: 0     amoxicillin 500 mg Capsule  Commonly known as: AMOXIL   2,000 mg, Oral, ONCE PRN  Qty: 4 Capsule  Refills: 12     ascorbic acid (vitamin C) 500 mg Tablet  Commonly known as: VITAMIN C   250 mg, Oral, DAILY  Refills: 0     aspirin 81 mg Tablet, Chewable   81 mg, Oral, DAILY  Qty: 365 Tablet  Refills: 9     Cholecalciferol (Vitamin D3) 50 mcg (2,000 unit) Capsule   2,000 Units, Oral, DAILY  Refills: 0     * clobetasoL 0.05 % Cream  Commonly known as: TEMOVATE   Apply Topically, 2 TIMES DAILY  Qty: 1 Tube  Refills: 1     * clobetasoL 0.05 % Solution  Commonly known as: CORMAX   APPLY SOLUTION TOPICALLY TO AFFECTED AREA TWICE DAILY  Qty: 50 mL  Refills: 1  Fluocinolone-Shower Cap 0.01 % Oil   Apply to scalp 1-2 times weekly at night.  Qty: 118.28 mL  Refills: 1     hydroCHLOROthiazide 25 mg Tablet  Commonly known as: HYDRODIURIL   25 mg, Oral, DAILY  Qty: 90 Tablet  Refills: 4     loratadine 10 mg Tablet  Commonly known as: CLARITIN   10 mg, Oral, DAILY  Refills: 0     metoprolol succinate 50 mg Tablet Sustained Release 24 hr  Commonly known as: TOPROL-XL   50 mg, Oral, DAILY  Qty: 90 Tablet  Refills: 4     nystatin 100,000 unit/gram Powder  Commonly known as: NYSTOP   Apply Topically, 2 TIMES DAILY PRN  Qty: 60 g  Refills: 0     triamcinolone acetonide 0.1 % Cream  Commonly known as: ARISTOCORT A   Topical, 2 TIMES DAILY  Qty: 80 g  Refills: 3     warfarin 2 mg Tablet  Commonly known as: COUMADIN   6 mg, Oral, EVERY EVENING  Qty: 90 Tablet  Refills: 3         * This list has 2 medication(s) that are the same as other medications prescribed for you. Read the directions carefully, and ask your doctor or other care provider to review them with you.              Discharge med list refreshed?  YES      ALLERGIES:  Allergies   Allergen Reactions   . Iv  Contrast Anaphylaxis   . Oregano      Hives     . Stadol [Butorphanol Tartrate] Mental Status Effect   . Tomato Hives/ Urticaria       HOSPITAL PROCEDURE(S):   Bedside Procedures:  No orders of the defined types were placed in this encounter.    Surgical Procedures:  Procedure(s):  Right - ARTHROPLASTY HIP ANTERIOR TOTAL MAKO ROBOTIC ASSISTED - Wound Class: Clean Wound: Uninfected operative wounds in which no inflammation occurred        [place]:  This is a 65 y.o., female with right hip arthritis who underwent Mako robotic-assisted right direct anterior total hip arthroplasty with Sharlene Motts, MD on 03/13/2021. Postoperatively, the patient received 3 doses if IV antibiotics per  protocol. Dressing will be changed by the patient on post-op day #2 at home per his discharge instructions. At the time of discharge, her incision was clean, dry, and intact with no signs of infection. At the time of discharge, her pain was well-controlled with oral pain medication, she has been taking oral fluid and solids well, urinated and was passing gas. She worked with physical therapy who deemed the patient appropriate for discharge to home. She will follow up with Dr. Sharlene Motts on the below listed date.    Course: Unremarkable post-op course. Ms Mckenna has a hx of sciatica in her LLE. Notice an increase in symptoms following RTHA.  She will resume her home exercises for sciatic as provided by PT when she had her initial onset of symptoms. She will follow up with Dr Sharlene Motts in 2 weeks for reassessment.   Antibiotics: Ancef x 24 hours post op  Foley: None  Drain: None  Dressing: Left in place  Incision: C/d/i, no signs of infection  PT/OT: Patient appropriate for d/c to home    Additional Consults:  1.  OMOP  - Consulted for medical optimization .  CONDITION ON DISCHARGE:  General condition: Pain controlled, tolerating PO intake, passing gas  Weight bearing status:  WBAT RLE anterior hip precautions  DVT prophylaxis:  Resume home Warfarin 6 mg M & Thurs, 4 mg all other days) and ASA 81mg  po daily  Self-care ability: Limited secondary to procedure  Cognitive status: A&O  Follow-up: Pt will f/u in 2 wks with Dr. Sharlene Motts (03/31/2021)  Code status at discharge: Full code  Please review attached discharge instructions and call with any questions or concerns.      LINES/DRAINS/WOUNDS AT DISCHARGE:   Patient Lines/Drains/Airways Status     Active Line / Dialysis Catheter / Dialysis Graft / Drain / Airway / Wound     Name Placement date Placement time Site Days    Peripheral IV Left;Posterior Dorsal Metacarpals  (top of hand) 03/13/21  0844  -- 1    Peripheral IV Left Dorsal Metacarpals  (top of hand) 03/13/21  1016  -- 1    Surgical Incision Anterior;Right Hip 03/13/21  1038  -- 1    Surgical Incision Other (Comment) Anterior;Left Hip 03/13/21  1038  -- 1                OPIOID PRESCRIPTION - FIRST PRESCRIPTION  Diagnosis requiring prescription: S/p Mako robotic-assisted right direct anterior total hip arthroplasty.    Medication Dosage/Frequency being prescribed:   Oxycodone 5mg  (1-2 tablets every 4 hours as needed for pain)    I have reviewed prior medication history in the medical record for this patient.  I have also reviewed information contained in the state controlled prescription drug monitoring database.    I have discussed any history of non-pharmacological treatment with the patient.  The patient reports prior treatment history.      The patient denies history of substance abuse treatment.      Physical exam findings and/or clinical history warranting use of opioid treatment include: Orthopaedic joint replacement surgery    My goals for treatment include: Pain control and resumption of ADLs    I have discussed the risk of opioid addiction with the patient.  I have also discussed the risk of using sedatives and alcohol while taking opioids.      DISCHARGE DISPOSITION:  Home  discharge            DISCHARGE INSTRUCTIONS:       TOTAL HIP REPLACEMENT DISCHARGE INSTRUCTIONS    Total Hip Replacement Discharge Instructions    -Weight bearing status on the operative extremity: weight bearing as tolerated    -You Must use two crutches or a walker until further notice.    - You may not switch to the use of a cane.     - No driving until further notice.  The decision to commence driving will be decided at your first postoperative visit.    - Resume a healthy diet when you get home.    - Resume your usual home medications when you get home unless otherwise indicated at the time of discharge.    - Smoking raises one's risk of post-operative infection, wound healing problems and DVT (blood clots).  If you are a smoker and cannot quit permanently, it is best that you wait at least four weeks from surgery before you resume smoking.        Exercise and restrictions    -Observe hip dislocation precautions as instructed by the therapy staff for the first 6 weeks.  Please refer to the physical therapist's instructions and therapy  diagrams.     -Joint center anterolateral hip precautions:         *Do not turn your foot outward - the feet and bellybutton should be pointed in the same direction.       *Do not cross your ankles in bed.    - Physical therapy is rarely necessary after total hip replacement.  The only exercise we recommend is walking and gluteal, quadriceps and calf contractions.  These do not require the supervision by a physical therapist.    - No weightlifting (resistive exercise) with the operative leg for the first 6 weeks.    - No straight leg raising exercise after total hip replacement for 6 weeks as it causes groin pain.        Wound care    - Change the bandage daily with dry gauze.  The incision may be left open to air if there is no drainage.  Someone should inspect the incision daily for you.      - Two days after wound drainage stops, you may shower and wet the incision but you may  not swim or soak in a tub.    - Do not apply creams, lotions, salves or medicated ointments to the incision.  Simply apply dry gauze and nothing more.    - I am to be called for ANY DRAINAGE that occurs more than 6 days after surgery    - If you have sutures or staples, only I or one of my staff are to remove them.  They are not to be removed by anyone else unless personally approved by me.        DVT (blood clots)    -Swelling of the operative leg is normal after discharge because you will be more active at home. If however you develop new pain in the groin, thigh, or calf and if swelling is unrelieved with elevation, this may be a sign of a DVT (blood clot). Please notify your surgeon immediately if this occurs or go the nearest ER to have an ultrasound of your leg.    -Bruising of the operative leg is normal and sometimes the bruising travels all the way to the ankle, foot and toes.  This will gradually go from purple to green to yellow before it disappears completely.    -To prevent DVT (blood clot), the following is advised: Take your normal aspirin and coumadin    -TED hose by providing compression can help prevent DVT and are to be worn on both legs 18 hours per day for 4 weeks.        Post-operative Infection    -Increasing pain, redness, warmth, shaking chills, sweats or fever may be a sign of a post-operative infection.  Please notify me immediately by calling [phone].  If after hours, ask to speak to the orthopaedic resident on call.    -A fever is not uncommon the first three days after surgery.  A fever of 100.5 or higher more than 72 hours after surgery is a concern and you should notify us if this occurs.    - Please inspect the incision every day.  A little spotting on the dressing is normal.  Drainage from the incision that persists more than 6 days after surgery may be a sign of infection and requires my immediate attention.  Please call [phone] and ask to speak to me (or the resident on  call after hours or on weekends).    -No one should place  you on an antibiotic for signs of a surgical site infection without first consulting this surgeon.  Infection of the hip wound is a surgical emergency and cannot be cured with antibiotics alone.    - No dental procedures for 6 weeks as this can cause infection in your new joint replacement    - You must receive antibiotics immediately prior to dental visits and procedures like colonoscopy and cystoscopy.  This is a life-long recommendation.        Falls    -A fall after surgery can be devastating.  Falls can result in dislocation, rupture of a ligament or tendon, or fracture of a bone around the new prosthesis. These injuries can be extremely difficult to repair and often require a revision of the entire prosthesis.  Revision operations are associated with a higher complication rate.  Therefore it is crucial that you avoid falling after surgery.  Many falls are caused by poor lighting, a throw rug, a small pet, or too much haste.  Please take time to remove obstacles, secure pets somewhere safe, and improve lighting at home with the use of properly placed nightlights.        Nozin Nasal Swabbing    -Surgical site infections may be related to the number of times we touch our face and nose as the nostrils do harbor bacteria.  Nozin can disinfect the nostrils and reduce the risk of surgical site infection.  Please swab the inside of both nostrils three times per day with a fresh Q-tip as instructed by the nursing staff.  Do this for the first 2 weeks after surgery.        Post-operative pain    -Ice may be applied 20 minutes at a time every 2 hours as needed.  Be careful not to apply ice to bare skin as ice can cause a burn just like heat.    -PRESCRIPTION NARCOTIC PAIN RELIEVERS ARE HIGHLY ADDICTIVE.  THEY SHOULD BE RESERVED FOR MODERATE TO SEVERE PAIN.  THE PRESCRIPTION NARCOTIC WILL NOT BE REFILLED FOR THIS PROBLEM.    -For mild pain, use 2 regular strength  Tylenol every 6 hours as needed  -For moderate pain, take 1 prescription pain pill every 4 hours as needed  -For more severe pain, take 2 prescription pain pills every 4 hours as needed    - Prescription pain relievers are constipating for most people.  Be prepared with over the counter remedies like Milk of Magnesia, Prune Juice, Hot Tea, etc.  Senokot has been prescribed to help with constipation.  Fleets enemas and suppositories are available over the counter if needed.    -Unused pain pills may be discarded in the trash after mixing with kitty litter or coffee grounds.      -If you experience chest pain or shortness of breath, please call 911 immediately.  This may be a sign of a heart attack or pulmonary embolus (blood clot that travels to the heart and lung).    -Follow up with your surgeon 10 to 14 days from discharge.  The appointment date and time will be given to you either before discharge, or you will be contacted via phone/mail with the date and time.      -Please re-read these discharge instructions when you return home as many of your questions may be answered.    [facility]  [phone]          Despite patient's co-morbid conditions, risk factors and predicted length of  stay of  2 days, patient has demonstrated stability for discharge sooner than expected.    --  Arthur Holms, DMSc, PA-C  Department of Orthopaedic Surgery  Pager: [phone]  Phone: [phone] (7am-3p)    03/14/2021 12:16          Copies sent to Care Team       Relationship Specialty Notifications Start End    Derek Jack, [place] PCP - General Family Practice-BA  01/08/16     Phone: [phone] Fax: [phone]         [address] [place] [address]    Joaquin Bend, MD Cardiology Fellow CARDIOLOGY  05/01/16     Phone: [phone] Fax: [phone]         [place] [address]    [place] Team, [facility] Anticoagulation   05/11/19     Filbert Berthold and Leone Brand   [phone]     Jamas Lav, [facility] Admissions 01/09/20     Phone: [phone] Fax: [phone]         [place] [address]    [place], [place], MD  ADULT RECONSTRUCTIVE [facility] Admissions 01/13/21     Phone: [phone] Fax: [phone]         [address] [place] [address]          Referring providers can utilize [email or web address] to access their referred [place] patient's information.              cc: Primary CarePhysician:  Derek Jack, APRN,FNP-BC  [place] [address]     NB:VAPOLIDCV Physician:  No referring provider defined for this encounter.

## 2021-03-17 ENCOUNTER — Telehealth (HOSPITAL_COMMUNITY): Payer: Self-pay | Admitting: Family

## 2021-03-17 NOTE — Progress Notes (Signed)
03/17/21-0918- Transition of Care Contact  Discharge Date: 03/14/2021  Transition Facility Type--Hospital (Inpatient or Observation)  [facility]  Interactive Contact(s): Completed or attempted contact indicated by Date/Time  Completed Contact: 03/17/2021  9:10 AM  Contact Method(s)-- Patient/Caregiver Telephone  Clinical Staff Name/Role who contacted--Leita Lindbloom Audree Camel  Transition Assessment  Discharge Summary obtained?--Yes  How are you recovering?--Improving  Discharge Meds obtained?--Yes  Medications reconcilled with Discharge Documentation?--Yes  Medication understanding --knows new medication(s)  Medication Concerns?--No  Have everything needed for recovery?--Yes  Care Coordination:   Patient has transition follow-up appointment date and time?--Yes  Primary Care Transition Visit planned?--No  Specialist Transition Visit planned?--Yes  Patient/caregiver plans to attend transition visit?--Yes  Primary Follow-up Barrier  Interventions provided --reinforced discharge instructions  Home Health or DME ordered at discharge?--No  Clinician/Team notified?--No  Primary reason clinician notified?  Transition Note:   03/17/21-0911- Transition of Care Follow-Up Telephone Encounter    Plan:      Patient requests transportation referral for follow up appointment? No    Instructions:     Reviewed After Visit [place] (AVS), Reviewed discharge instructions as outlined on AVS. Yes  2.   Agrees to contact PCP for non-acute symptoms during the hours of 8 a.m. - 4 p.m., PCP number provided/reinforced Yes   3.   Nurse Navigators are available 24/7 at [phone] for questions and concerns.  Number provided to patient : Yes    Referrals to population health? No     Additional information/assessment:  Spoke with patient who appeared alert and appropriate during our conversation - advising she is " progressing ." Patient advising she is up with assist of walker. Patient advising post op discomfort is  controlled with ordered medication- advised to not let pain get out of control before taking medication as directed. Patient advising she is doing wound care as directed and no current concerns at incision site. Advised to monitor for s/s infection and fu prn. Patient advising she is taking po well. Reenforced fu with Ortho on 03/31/21 as per Epic- patient advising she has transportation to the appt. Clayton Lefort RN BSN     PCP: Derek Jack, APRN,FNP-BC    Clayton Lefort, RN       Further information may be documented in relevant telephone or outreach encounter.

## 2021-03-18 ENCOUNTER — Telehealth (HOSPITAL_BASED_OUTPATIENT_CLINIC_OR_DEPARTMENT_OTHER): Payer: Self-pay | Admitting: Orthopaedic Surgery

## 2021-03-18 NOTE — Telephone Encounter (Signed)
Called to see how the patient was doing.  They deny any fever chills or falling.  They are taking their Aspirin/coumadin and wearing their TED hose.  Their incision is healing well and is without redness, edema or drainage.  They are aware of their upcoming apt and were encouraged to call with any issues or concerns

## 2021-03-19 ENCOUNTER — Ambulatory Visit (HOSPITAL_COMMUNITY): Payer: Self-pay | Admitting: Cardiovascular Disease

## 2021-03-19 NOTE — Progress Notes (Signed)
Anticoagulation Encounter Note:    Surgical AVR plan   Warfarin start date: 01-06-2019   [facility] Staff: Earlie Raveling   DX : On-X AVR   INR Range: 1.5 - 2.0  Tab strength: warfarin 2mg    Contact 1: [phone] cell   Contact 2:   Lab: P[facility] - [facility]   Comments: Tylenol regularly    INR: no INR for this encounter    Current Dose: 6 mg M,Th/ 4 mg all other days     Patient Findings       Positives:  Change in health (pt s/p hip replacement)          Plan: pt called to notify the clinic that she had her procedure and is home and back on her warfarin. Instructed for pt to continue current dosing and check INR on Thursday. Dosing verified by pt.     Next INR Date: 03/20/21    Leone Brand, BSN RN  Specialty Care Nurse, Anticoagulation  [facility]  Ph. [phone]  (ext. [phone])  Fax [phone]  [email or web address]        For additional information, see dosing calendar below.    Warfarin Therapy Instructions   May 2022 Details    Sun Molli Knock Tue Wed Thu Fri Sat     1               2               3               4               5               6               7                 8               9               10               11               12               13               14                 15               16               17               18               19    1.6   4 mg   See details      20      4 mg         21      Hold           22      Hold         23      Hold         24      Hold         25      Hold  26   1.16   Hold   See details      27      4 mg         28      4 mg           29      4 mg         30      6 mg         31      4 mg              Date Details   05/19 Last INR check     INR: 1.6      05/26 INR: 1.16              Warfarin Therapy Instructions   June 2022 Details    Sun [name] Thu Fri Sat        1      4 mg   See details      2      6 mg         3               4                 5               6               7               8               9               10                11                 12               13               14               15               16               17               18                 19               20               21               22               23               24               25                 26               27               28                29  30                  Date Details   06/01 This INR check       Date of next INR:  03/20/2021

## 2021-03-20 ENCOUNTER — Ambulatory Visit (HOSPITAL_COMMUNITY): Payer: Self-pay

## 2021-03-20 ENCOUNTER — Encounter (HOSPITAL_COMMUNITY): Payer: Self-pay

## 2021-03-20 LAB — PT/INR: INR: 1.4

## 2021-03-20 NOTE — Progress Notes (Signed)
Anticoagulation Encounter Note:    Surgical AVR plan   Warfarin start date: 01-06-2019   [facility] Staff: Earlie Raveling   DX : On-X AVR   INR Range: 1.5 - 2.0  Tab strength: warfarin 2mg    Contact 1: [phone] cell   Contact 2:   Lab: POC/[place] - [facility]   Comments: Tylenol regularly    INR: 1.4    Current Dose: 6 mg M,Th/ 4 mg all other days    Clinical Outcomes       Positives:  INR Below Goal (INR slightly low. pt called. )          Patient Findings       Positives:  Change in health (had recent hip surgery and warfarin was on hold)          Plan: pt instructed to take warfarin 6 mg once tomorrow then resume current dosing and recheck INR in one week. Dosing verified by pt.     Next INR Date: 03/27/21    Leone Brand, BSN RN  Specialty Care Nurse, Anticoagulation  [facility]  Ph. [phone]  (ext. [phone])  Fax [phone]  [email or web address]        For additional information, see dosing calendar below.    Warfarin Therapy Instructions   June 2022 Details    Sun Molli Knock Tue Wed Thu Fri Sat        1      4 mg   See details      2   1.4   6 mg   See details      3      6 mg         4      4 mg           5      4 mg         6      6 mg         7      4 mg         8      4 mg         9      6 mg         10               11                 12               13               14               15               16               17               18                 19               20               21               22                23  24               25                 26               27               28               29               30                   Date Details   06/01 Last INR check      06/02 This INR check     INR: 1.4       Date of next INR:  03/27/2021

## 2021-03-27 ENCOUNTER — Ambulatory Visit (HOSPITAL_COMMUNITY): Payer: Self-pay

## 2021-03-27 ENCOUNTER — Encounter (HOSPITAL_COMMUNITY): Payer: Self-pay

## 2021-03-27 LAB — PT/INR: INR: 1.8

## 2021-03-27 NOTE — Progress Notes (Signed)
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 pt called)          Plan:  pt to continue current dosing and recheck INR in two weeks. Dosing verified by pt.     Next INR Date: 04/10/21    Leone Brand, BSN RN  Specialty Care Nurse, Anticoagulation  [facility]  Ph. [phone]  (ext. [phone])  Fax [phone]  [email or web address]        For additional information, see dosing calendar below.    Warfarin Therapy Instructions   June 2022 Details    Sun [name] Thu Fri Sat        1               2   1.4   6 mg   See details      3      6 mg         4      4 mg           5      4 mg         6      6 mg         7      4 mg         8      4 mg         9   1.8   6 mg   See details      10      4 mg         11      4 mg           12      4 mg         13      6 mg         14      4 mg         15      4 mg         16      6 mg         17      4 mg         18      4 mg           19      4 mg         20      6 mg         21      4 mg         22      4 mg         23      6 mg         24               25  26               27               28               29               30                   Date Details   06/02 Last INR check   INR: 1.4      06/09 This INR check   INR: 1.8       Date of next INR:  04/10/2021

## 2021-03-30 ENCOUNTER — Encounter (INDEPENDENT_AMBULATORY_CARE_PROVIDER_SITE_OTHER): Payer: Self-pay

## 2021-03-31 ENCOUNTER — Encounter (INDEPENDENT_AMBULATORY_CARE_PROVIDER_SITE_OTHER): Payer: Self-pay

## 2021-03-31 ENCOUNTER — Ambulatory Visit: Payer: Medicare Other | Attending: Orthopaedic Surgery | Admitting: Physician Assistant

## 2021-03-31 ENCOUNTER — Other Ambulatory Visit: Payer: Self-pay

## 2021-03-31 ENCOUNTER — Ambulatory Visit (INDEPENDENT_AMBULATORY_CARE_PROVIDER_SITE_OTHER): Payer: Medicare Other

## 2021-03-31 VITALS — BP 136/82 | HR 78 | Temp 96.8°F | Ht 64.96 in | Wt 218.3 lb

## 2021-03-31 DIAGNOSIS — Z96641 Presence of right artificial hip joint: Secondary | ICD-10-CM | POA: Insufficient documentation

## 2021-03-31 DIAGNOSIS — M47819 Spondylosis without myelopathy or radiculopathy, site unspecified: Secondary | ICD-10-CM

## 2021-03-31 NOTE — Progress Notes (Signed)
[place]  [place] [address]  Operated by [facility]    SUBJECTIVE  [name] is a 65 y.o. female who returns to clinic today post operatively from a right total hip arthroplasty which was performed on 03/13/2021.  The patient is doing well post operatively. The patient reports mild pain in her right hip and states that her pain is now a 4/10.   The patient is currently using a walker for ambulation.  The patient is eating well and reports regular bowel movements.  Denies any fever/chills, N/V, constipation, diarrhea or any other signs of systemic illness. Denies any drainage from the incision site. She was having severe sciatic pain in LLE. She's had this before. She would like a referral to the spine team. She has a history of yeast infection under her abdominal fold and uses Vaseline or nystatin powder for treatment. She has erythema under the right side of the abdominal fold today but hasn't done any treatment for it yet.      REVIEW OF SYSTEMS  General: negative for fevers or night sweats  Cardiovascular: negative for chest pain  Respiratory: negative for shortness of breath  Gastrointestinal: negative for digestive problems  Genitourinary: negative for urinary problems  Musculoskeletal: positive for right hip pain, negative knee pain     All other systems reviewed and negative    PHYSICAL EXAMINATION   BP 136/82   Pulse 78   Temp 36 C (96.8 F)   Ht 1.65 m (5' 4.96")   Wt 99 kg (218 lb 4.1 oz)   BMI 36.36 kg/m     GEN:   NAD  EYES: Sclera white.  PULM:   Normal respiratory effort.    MS: stable gait.   NEURO:   Alert and oriented to person, place and time.    PSYCHOSOCIAL:   Pleasant.  Normal affect.    WOUND/INCISION:   Wound margins intact and healing well.  No signs of ecchymosis, hypergranulation, hernia or drainage.  Proximal incision is red and irritated from abdominal fold. Intertriginous erythema present  under right abdominal fold.      RADIOGRAPHS  Radiographs were reviewed at this visit.    X-Ray of right hip was interpreted in clinic to show well positioned and aligned implants.    ASSESSMENT     ICD-10-CM    1. Facet arthropathy  M47.819 Refer to [place]   2. Status post right hip replacement  Z96.641 Right [facility] Series x-ray       PLAN  [name] is approximately 2 weeks s/p right total hip arthroplasty. she is doing well post operatively.     The patient is taking ASA and warfarin daily for DVT prophylaxis which are her home meds.    I advised her to use nystatin powder or Vaseline under the R abdominal fold. I told her to not put anything over her incision. I told her to keep her incision clean and dry and to put a piece of gauze over her proximal incision and change it several times a day to keep is dry.    The patient will need antibiotic prophylaxis before future procedures.     The patient was told that she will have a follow up visit scheduled in the [facility] in 2 weeks for incision check at which time right hip x-rays will be taken. Orders were placed in the system. The patient was  instructed to call with any new or concerning symptoms. All of the patient's questions were answered and she agreed to this plan.    This visit was rendered independently without a supervising/collaborating physician on site.     [name] Inch Adelia Baptista, PA-C 03/31/2021, 09:44     Cc:   [name] Jack, APRN,FNP-BC  [place] [address]

## 2021-04-10 ENCOUNTER — Ambulatory Visit (HOSPITAL_COMMUNITY): Payer: Self-pay

## 2021-04-10 ENCOUNTER — Encounter (HOSPITAL_COMMUNITY): Payer: Self-pay

## 2021-04-10 LAB — PT/INR: INR: 1.9

## 2021-04-10 NOTE — Progress Notes (Signed)
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 pt called. )          Plan:  pt to continue current dosing and recheck INR in two weeks. Dosing verified by pt.     Next INR Date: 04/24/21    Leone Brand, BSN RN  Specialty Care Nurse, Anticoagulation  [facility]  Ph. [phone]  (ext. [phone])  Fax [phone]  [email or web address]        For additional information, see dosing calendar below.    Warfarin Therapy Instructions   June 2022 Details    Sun [name] Thu Fri Sat        1               2               3               4                 5               6               7               8               9    1.8   6 mg   See details      10      4 mg         11      4 mg           12      4 mg         13      6 mg         14      4 mg         15      4 mg         16      6 mg         17      4 mg         18      4 mg           19      4 mg         20      6 mg         21      4 mg         22      4 mg         23   1.9   6 mg   See details      24      4 mg         25      4 mg  26      4 mg         27      6 mg         28      4 mg         29      4 mg         30      6 mg            Date Details   06/09 Last INR check   INR: 1.8      06/23 This INR check   INR: 1.9              Warfarin Therapy Instructions   July 2022 Details    Sun Mon Tue Wed Thu Fri Sat          1      4 mg         2      4 mg           3      4 mg         4      6 mg         5      4 mg         6      4 mg         7      6 mg         8               9                 10               11               12               13               14               15                16                 17               18               19               20               21               22               23                 24               25               26               27               28               29                30  31                      Date Details   No additional details    Date of next INR:  04/24/2021

## 2021-04-15 ENCOUNTER — Ambulatory Visit: Payer: Medicare Other | Attending: Orthopaedic Surgery | Admitting: Orthopaedic Surgery

## 2021-04-15 ENCOUNTER — Other Ambulatory Visit: Payer: Self-pay

## 2021-04-15 ENCOUNTER — Encounter (HOSPITAL_BASED_OUTPATIENT_CLINIC_OR_DEPARTMENT_OTHER): Payer: Self-pay | Admitting: Physician Assistant

## 2021-04-15 ENCOUNTER — Ambulatory Visit (HOSPITAL_BASED_OUTPATIENT_CLINIC_OR_DEPARTMENT_OTHER)
Admission: RE | Admit: 2021-04-15 | Discharge: 2021-04-15 | Disposition: A | Discharge status: Home / Self Care | Payer: Medicare Other | Source: Ambulatory Visit | Admitting: Radiology

## 2021-04-15 VITALS — BP 145/86 | HR 73 | Temp 97.7°F | Ht 64.92 in | Wt 221.8 lb

## 2021-04-15 DIAGNOSIS — Z96641 Presence of right artificial hip joint: Secondary | ICD-10-CM

## 2021-04-15 MED ORDER — FLUCONAZOLE 100 MG TABLET
100.0000 mg | ORAL_TABLET | Freq: Every day | ORAL | 0 refills | Status: AC
Start: 2021-04-15 — End: 2021-04-22

## 2021-04-15 NOTE — Progress Notes (Signed)
[place]  [place] [address]  Operated by [facility]    SUBJECTIVE  [name] is a 65 y.o. female who returns to clinic today post operatively from a right total hip arthroplasty which was performed on 03/13/2021.  The patient is doing well post operatively. The patient reports mild pain in her right hip that usually occurs in her groin with active hip flexion.  Denies any drainage from the incision site but says the rash under her abdominal fold is worse. She has treid Vaseline and nystatin powder with no improvement.     REVIEW OF SYSTEMS  General: negative for fevers or night sweats  Cardiovascular: negative for chest pain  Respiratory: negative for shortness of breath  Gastrointestinal: negative for digestive problems  Genitourinary: negative for urinary problems  Musculoskeletal: positive for hip pain, negative knee pain     All other systems reviewed and negative    PHYSICAL EXAMINATION   BP (!) 145/86    Pulse 73    Temp 36.5 C (97.7 F) (Thermal Scan)    Ht 1.649 m (5' 4.92")    Wt 101 kg (221 lb 12.5 oz)    BMI 37.00 kg/m       GEN:   NAD  EYES: Sclera white.  PULM:   Normal respiratory effort.    MS:  Right hip has smooth and painless rotation. Groin pain with active hip flexion.    NEURO:   Alert and oriented to person, place and time.    PSYCHOSOCIAL:   Pleasant.  Normal affect.    WOUND/INCISION:   Wound margins intact and well healed. bright red intertriginous rash present under abdominal fold which is now over her right hip incision.      RADIOGRAPHS  Radiographs were performed and reviewed at this visit.    X-Ray of right hip was interpreted in clinic to show well positioned and aligned implants. No evidence of loosening, stem subsidence, fracture or dislocation.     ASSESSMENT     ICD-10-CM    1. Status post right hip replacement  Z96.641 Right [facility] x-ray       PLAN  [name] is  approximately 6 weeks s/p right total hip arthroplasty. she is doing well post operatively.     The patient will need antibiotic prophylaxis before future procedures.     Oral diflucan 100 mg was sent to her pharmacy to take for 7 days. She will be in communication with Leone Brand from the anticoagulation team regarding her INR labs. She will have to get INR checked daily while taking diflucan. We also sent clotrimazole cream to her pharmacy to use over her rash also.    The patient was told that she will have a follow up visit scheduled in the [facility] in 1 week for a skin check. We will not need xrays. The patient was instructed to call with any new or concerning symptoms. All of the patient's questions were answered and she agreed to this plan.    A shared visit was performed with the co-signing physician.  Katie L Seifried, PA-C 04/15/2021, 15:00     I personally saw and evaluated the patient. See mid-level's note for additional details. My findings/participation are good recovery.    [name], MD      Cc:   Derek Jack, APRN,FNP-BC  [place] [address]

## 2021-04-16 ENCOUNTER — Encounter (INDEPENDENT_AMBULATORY_CARE_PROVIDER_SITE_OTHER): Payer: Self-pay | Admitting: Physician Assistant

## 2021-04-16 ENCOUNTER — Other Ambulatory Visit (HOSPITAL_BASED_OUTPATIENT_CLINIC_OR_DEPARTMENT_OTHER): Payer: Self-pay | Admitting: Physician Assistant

## 2021-04-16 ENCOUNTER — Ambulatory Visit: Payer: Medicare Other | Attending: Physician Assistant | Admitting: Physician Assistant

## 2021-04-16 ENCOUNTER — Ambulatory Visit (INDEPENDENT_AMBULATORY_CARE_PROVIDER_SITE_OTHER): Payer: Medicare Other

## 2021-04-16 VITALS — BP 125/69 | HR 70 | Temp 97.3°F | Ht 64.9 in | Wt 221.8 lb

## 2021-04-16 DIAGNOSIS — M545 Low back pain, unspecified: Secondary | ICD-10-CM

## 2021-04-16 DIAGNOSIS — M47819 Spondylosis without myelopathy or radiculopathy, site unspecified: Secondary | ICD-10-CM | POA: Insufficient documentation

## 2021-04-16 MED ORDER — CLOTRIMAZOLE 1 % TOPICAL CREAM
TOPICAL_CREAM | Freq: Two times a day (BID) | CUTANEOUS | 0 refills | Status: AC
Start: 2021-04-16 — End: 2021-04-23

## 2021-04-17 ENCOUNTER — Encounter (HOSPITAL_COMMUNITY): Payer: Self-pay

## 2021-04-17 ENCOUNTER — Ambulatory Visit (HOSPITAL_COMMUNITY): Payer: Self-pay

## 2021-04-17 DIAGNOSIS — Z96641 Presence of right artificial hip joint: Secondary | ICD-10-CM

## 2021-04-17 LAB — PT/INR: INR: 1.7

## 2021-04-17 NOTE — Progress Notes (Signed)
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 pt called. )        Patient Findings     Positives:  Change in health (being tx for rash near incision site from recent hip replacement. ), Change in medications (pt has had one dose diflucan. will be on for 17 days)        Plan: pt had been instructed to take warfarin 2 mg last evening. Pt instructed to take warfarin 4 mg tonight and recheck INR on Friday. Dosing verified by pt.     Next INR Date: 04/18/21    Leone Brand, BSN RN  Specialty Care Nurse, Anticoagulation  [facility]  Ph. [phone]  (ext. [phone])  Fax [phone]  [email or web address]        For additional information, see dosing calendar below.    Warfarin Therapy Instructions   June 2022 Details    Sun [name] Thu Fri Sat        1               2               3               4                 5               6               7               8               9               10               11                 12               13               14               15               16               17               18                 19               20               21               22               23    1.9   6 mg  See details      24      4 mg         25      4 mg           26      4 mg         27      6 mg         28      4 mg         29      2 mg         30   1.7   4 mg   See details         Date Details   06/23 Last INR check   INR: 1.9      06/30 This INR check   INR: 1.7              Warfarin Therapy Instructions   July 2022 Details    Sun [name] Thu Fri Sat          1      4 mg         2                 3               4                5               6               7               8               9                 10               11               12               13               14               15               16                 17               18               19               20               21               22               23                 24               25               26                27  28               29               30                 31                       Date Details   No additional details    Date of next INR:  04/18/2021

## 2021-04-18 ENCOUNTER — Ambulatory Visit (HOSPITAL_COMMUNITY): Payer: Self-pay

## 2021-04-18 ENCOUNTER — Encounter (HOSPITAL_COMMUNITY): Payer: Self-pay

## 2021-04-18 DIAGNOSIS — M533 Sacrococcygeal disorders, not elsewhere classified: Secondary | ICD-10-CM

## 2021-04-18 DIAGNOSIS — M5137 Other intervertebral disc degeneration, lumbosacral region: Secondary | ICD-10-CM

## 2021-04-18 LAB — PT/INR: INR: 1.7

## 2021-04-18 NOTE — Progress Notes (Signed)
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 pt called. )        Patient Findings     Positives:  Change in medications (continues on diflucan)        Plan: due to pt taking the diflucan currently instructed for pt to take warfarin 4 mg F/ 2 mg Sa/ pt has home machine and will check INR on Sunday. If INR is above 3.0 on Sunday pt to hold warfarin dose that day. Otherwise pt will take warfarin 4 mg Su,Mo and recheck INR on Tuesday. Dosing verified by pt.     Next INR Date: 04/22/21    Tyr Franca, BSN RN  Specialty Care Nurse, Anticoagulation  [facility]  Ph. [phone]  (ext. [phone])  Fax [phone]  [email or web address]        For additional information, see dosing calendar below.    Warfarin Therapy Instructions   June 2022 Details    Sun Mon Tue Wed Thu Fri Sat        1               2               3               4                 5               6               7               8               9               10               11                 12               13               14               15               16               17               18                 19               20               21               22   23               24               25                 26               27               28               29               30    1.7   4 mg   See details         Date Details   06/30 Last INR check   INR: 1.7              Warfarin Therapy Instructions   July 2022 Details    Sun Mon Tue Wed Thu Fri Sat          1   1.7   4 mg   See details      2      2 mg           3      4 mg         4      4 mg         5      4 mg          6               7               8               9                 10               11               12               13               14               15               16                 17               18               19               20               21               22               23                 24               25               26               27                28  29               30                 31                       Date Details   07/01 This INR check   INR: 1.7       Date of next INR:  04/22/2021

## 2021-04-22 ENCOUNTER — Ambulatory Visit: Payer: Medicare Other | Attending: Orthopaedic Surgery | Admitting: Orthopaedic Surgery

## 2021-04-22 ENCOUNTER — Encounter (HOSPITAL_COMMUNITY): Payer: Self-pay

## 2021-04-22 ENCOUNTER — Ambulatory Visit (HOSPITAL_COMMUNITY): Payer: Self-pay

## 2021-04-22 ENCOUNTER — Encounter (HOSPITAL_BASED_OUTPATIENT_CLINIC_OR_DEPARTMENT_OTHER): Payer: Self-pay | Admitting: Orthopaedic Surgery

## 2021-04-22 ENCOUNTER — Encounter (HOSPITAL_BASED_OUTPATIENT_CLINIC_OR_DEPARTMENT_OTHER): Payer: Self-pay | Admitting: DERMATOLOGY

## 2021-04-22 ENCOUNTER — Other Ambulatory Visit: Payer: Self-pay

## 2021-04-22 ENCOUNTER — Ambulatory Visit (HOSPITAL_BASED_OUTPATIENT_CLINIC_OR_DEPARTMENT_OTHER): Payer: Medicare Other | Admitting: DERMATOLOGY

## 2021-04-22 VITALS — BP 128/79 | HR 78 | Temp 97.0°F | Ht 64.92 in | Wt 222.7 lb

## 2021-04-22 VITALS — BP 120/72 | Temp 96.3°F | Wt 222.4 lb

## 2021-04-22 DIAGNOSIS — L409 Psoriasis, unspecified: Secondary | ICD-10-CM | POA: Insufficient documentation

## 2021-04-22 DIAGNOSIS — T8149XA Infection following a procedure, other surgical site, initial encounter: Secondary | ICD-10-CM | POA: Insufficient documentation

## 2021-04-22 DIAGNOSIS — L304 Erythema intertrigo: Secondary | ICD-10-CM

## 2021-04-22 DIAGNOSIS — Z96641 Presence of right artificial hip joint: Secondary | ICD-10-CM | POA: Insufficient documentation

## 2021-04-22 DIAGNOSIS — Z8589 Personal history of malignant neoplasm of other organs and systems: Secondary | ICD-10-CM

## 2021-04-22 DIAGNOSIS — R21 Rash and other nonspecific skin eruption: Secondary | ICD-10-CM

## 2021-04-22 DIAGNOSIS — Z85828 Personal history of other malignant neoplasm of skin: Secondary | ICD-10-CM

## 2021-04-22 DIAGNOSIS — Z808 Family history of malignant neoplasm of other organs or systems: Secondary | ICD-10-CM

## 2021-04-22 LAB — PT/INR
INR: 2
INR: 2.2

## 2021-04-22 NOTE — Progress Notes (Signed)
ORTHOPAEDIC PROGRESS NOTE    [name]  [id number]  06/16/1956  04/22/2021    SUBJECTIVE:  [name] is a 65 y.o. year old female here today for followup for a skin check. The patient developed a rash after a right total hip arthroplasty that was evaluated in clinic 1 week ago. There have not been any changes since her last clinic visit despite taking oral fluconazole and topical clobetazol. Her INR however has increased from 1.7 to 2.2. She has also been washing the rash with soap and water, drying, and then wrapping abdominal pads with an ACE wrap twice daily. She has noticed minimal thin, bloody drainage and states that has remained unchanged from last week. She has not experienced any hip pain or swelling and otherwise feels well.    OBJECTIVE:  There is a diffuse, red rash present on the lower quadrant of the abdomen that extends into the surgical incision. The surgical incision is red and has a small scab present within the distal third, but the incision does not appear to be draining. The incision is non-tender.    ASSESSMENT: The rash appears unchanged since our last visit. The rash extends directly into the surgical incision, which is concerning for possible infection. She otherwise appears to be doing well in her recovery from total hip arthroplasty.    PLAN:  [name] will visit Dr. Prince [name] at [facility] today for further evaluation and treatment of her rash. Will provisionally schedule follow-up visit for 2 weeks in clinic.    [name] Benne, MD 04/22/2021, 10:51  PGY-1 Orthopaedic Surgery Resident  [place] saw and examined the patient.  I reviewed the resident's note.  I agree with the findings and plan of care as documented in the resident's note.  Any exceptions/additions are edited/noted.  Severe likely fungal rash, however this is not improving despite oral and topical antifungals.  Urgent referral to derm.  Concern for PJI very low, but  concerned this could develop into one.    [name] Hey, MD

## 2021-04-22 NOTE — Progress Notes (Signed)
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 pt called. )          Plan: instructed for pt to take 4 mg Su/Mo recheck INR on Tuesday. Dosing verified by pt.     Next INR Date: 04/22/21    Leone Brand, BSN RN  Specialty Care Nurse, Anticoagulation  [facility]  Ph. [phone]  (ext. [phone])  Fax [phone]  [email or web address]  [email or web address]      For additional information, see dosing calendar below.    Warfarin Therapy Instructions   July 2022 Details    Sun Molli Knock Tue Wed Thu Fri Sat          1   1.7   4 mg   See details      2      2 mg           3   2.0   4 mg   See details      4      4 mg         5      2 mg   See details      6               7               8               9                 10               11               12               13               14               15               16                 17               18               19               20               21               22               23                 24               25                26  27               28               29               30                 31                       Date Details   07/01 Last INR check   INR: 1.7      07/03 INR: 2.0      07/05 This INR check       Date of next INR:  04/22/2021

## 2021-04-22 NOTE — Progress Notes (Signed)
Dermatology, [facility]  [place]  [address]  [phone]    Date:   04/22/2021  Name: [name]  Age: 65 y.o.    Chief complaint: Rash    HPI  [name] is a 65 y.o. 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 Patient notes a rash on her waist. States her orthopedist, who referred her here, thought it was fungal and was started on diflucan and clotrimazole 1% cream. Patient notes she has been using these medications for a week with minimal improvement. Patient also notes she has a history of inverse psoriasis. When asked about what type of soap she uses, patient notes she has been using Dial soap recently. Mentions she used [employer] for sensitive skin in the past. No additional skin-related complaints.  Her sister has a history of a NMSC.     Review of Systems   Constitutional: Negative for fever.   Cardiovascular: Negative for chest pain.   Gastrointestinal: Negative for diarrhea.     Current Medications   acetaminophen (TYLENOL) 500 mg Oral Tablet Take 650 mg by mouth Every night Taking 2 daily    amoxicillin (AMOXIL) 500 mg Oral Capsule Take 4 Capsules (2,000 mg total) by mouth Once, as needed (Prosthetic heart valve prophylaxis prior to dental, respiratory procedures.) for up to 1 dose    ascorbic acid, vitamin C, (VITAMIN C) 500 mg Oral Tablet Take 250 mg by mouth Once a day    aspirin 81 mg Oral Tablet, Chewable Take 1 Tablet (81 mg total) by mouth Once a day    celecoxib (CELEBREX) 200 mg Oral Capsule Take 1 Capsule (200 mg total) by mouth Once a day    Cholecalciferol, Vitamin D3, 50 mcg (2,000 unit) Oral Capsule Take 2,000 Units by mouth Once a day     clobetasoL (CORMAX) 0.05 % Solution APPLY SOLUTION TOPICALLY TO AFFECTED AREA TWICE DAILY    clobetasoL (TEMOVATE) 0.05 % Cream by Apply Topically route Twice daily    clotrimazole (LOTRIMIN) 1 % Cream Apply topically Twice  daily for 7 days    fluconazole (DIFLUCAN) 100 mg Oral Tablet Take 1 Tablet (100 mg total) by mouth Once a day for 7 days    Fluocinolone-Shower Cap 0.01 % Oil Apply to scalp 1-2 times weekly at night.    hydroCHLOROthiazide (HYDRODIURIL) 25 mg Oral Tablet Take 1 Tablet (25 mg total) by mouth Once a day    loratadine (CLARITIN) 10 mg Oral Tablet Take 10 mg by mouth Once a day    metoprolol succinate (TOPROL-XL) 50 mg Oral Tablet Sustained Release 24 hr Take 1 Tablet (50 mg total) by mouth Once a day    nystatin (NYSTOP) 100,000 unit/gram Powder by Apply Topically route Twice per day as needed    ondansetron (ZOFRAN) 4 mg Oral Tablet Take 1 Tablet (4 mg total) by mouth Every 6 hours as needed for Nausea/Vomiting    oxyCODONE (ROXICODONE) 5 mg Oral Tablet Take 1-2 Tablets (5-10 mg total) by mouth Every 4 hours as needed for Pain    sennosides-docusate sodium (SENOKOT-S) 8.6-50 mg Oral Tablet Take 1 Tablet by mouth Twice daily    triamcinolone acetonide (ARISTOCORT A) 0.1 % Cream Apply topically Twice daily    warfarin (COUMADIN) 2 mg Oral Tablet Take 3 Tablets (6 mg total) by mouth Every evening     Allergies   Allergen Reactions  Iv Contrast Anaphylaxis    Oregano      Hives      Stadol [Butorphanol Tartrate] Mental Status Effect    Tomato Hives/ Urticaria     Past Medical History:   Diagnosis Date    Abdominal hernia 04/29/2020    hx of 3 repairs    Anemia     Aortic stenosis     Arthritis     Atrial fibrillation (CMS HCC) 04/29/2020    one episode  after AVR    Back problem 04/29/2020    LBP    Beta blocker prescribed for left ventricular systolic dysfunction     Blood in stool     positive cologuard.  Has not yet scheduled colonoscopy    Blood thinned due to long-term anticoagulant use 04/29/2020    coumadin    Cancer (CMS HCC)     skin cancer L lip.  Needs MOHS to be scheduled    Chronic pain 04/29/2020    back and right hip    Colon polyp     Dysrhythmias     Heart murmur 01/04/2019    AVR    High blood  pressure     Motion sickness     Obesity     Rash 04/29/2020    scalp psoriasis and involves R ear    Valvular disease     aortic valve stenosis, following with CT surgery, echo 02/2016 and cath 03/2016    Wears glasses          Physical Exam  Vitals: Blood pressure 120/72, temperature 35.7 C (96.3 F), weight 101 kg (222 lb 7.1 oz), not currently breastfeeding.  Physical Exam  Constitutional:       General: She is not in acute distress.     Appearance: Normal appearance. She is well-developed. She is not diaphoretic.   HENT:      Head:     Skin:     General: Skin is warm and dry.          Neurological:      Mental Status: She is alert.     General skin exam was performed including head, neck, anterior/posterior trunk, bilateral upper & lower extremities and revealed no areas of concern other than those documented.    Assessment and Plan  Problem List Items Addressed This Visit    None       Visit Diagnoses       Psoriasis    -  Primary    Intertrigo        Hx of squamous cell carcinoma              Plan    Start triamcinolone cream twice daily to affected areas.   Recommended switching back to [employer] for sensitive skin.       Follow  The patient was educated on the importance of avoiding excessive sun exposure and wearing sunscreen daily. Advised patient to re-apply sunscreen every 2-3 hours. Advised the patient to avoid going to the tanning beds. Advised to check skin routinely for any changes, especially any new moles or changes in existing moles.    Follow up with [name] in 1 month.     I am scribing for, and in the presence of, Dr. Prince Rome for services provided on 04/22/2021.  Vernell Leep, SCRIBE    I personally performed the services described in this documentation, as scribed  in my presence, and it is both accurate  and complete.  Mauri Reading, MD

## 2021-04-23 NOTE — Progress Notes (Signed)
Anticoagulation Encounter Note:    Surgical AVR plan   Warfarin start date: 01-06-2019   HVI Staff: Earlie Raveling   DX : On-X AVR   INR Range: 1.5 - 2.0  Tab strength: warfarin 2mg    Contact 1: [phone] cell   Contact 2:   Lab: POC/[place] - [facility]   Comments: Tylenol regularly    INR: 2.2    Current Dose: 6 mg M,Th/ 4 mg all other days    Clinical Outcomes       Positives:  INR Above Goal (INR elevated. pt called. )             Plan: instructed pt to take warfarin 2 mg tu/ 4 mg We/and recheck INR on Thursday. Dosing verified by pt.     Next INR Date: 04/24/21    Leone Brand, BSN RN  Specialty Care Nurse, Anticoagulation  [facility]  Ph. [phone]  (ext. [phone])  Fax [phone]  [email or web address]        For additional information, see dosing calendar below.    Warfarin Therapy Instructions   July 2022 Details    Sun Molli Knock Tue Wed Thu Fri Sat          1               2                 3               4               5    2.2   2 mg   See details      6      4 mg         7      6 mg         8               9                 10               11               12               13               14               15               16                 17               18               19               20               21               22               23                 24               25                26  27               28               29               30                 31                       Date Details   07/05 Last INR check     INR: 2.2       Date of next INR:  04/24/2021

## 2021-04-23 NOTE — H&P (Signed)
PATIENT NAME: [name] NUMBER:  [id number]  DATE OF SERVICE: 04/16/2021  DATE OF BIRTH:  October 11, 1956    HISTORY AND PHYSICAL    CHIEF COMPLAINT:  Low back and left leg pain since about 2015.    HISTORY OF PRESENT ILLNESS:  [name] is a 65 year old female with past medical history of high blood pressure, heart disease, aortic valve replacement, and hip replacements with Dr. Sharlene Motts, the left hip in 2017 and the right hip more recently in May of this year, who presents to the clinic today with chief complaint of low back and left leg pain since 2015.  The patient does not report any inciting event to bring on her back pain, denying any trauma or falls.  She reports that this just came "out of the blue."  The patient pinpoints the majority of her pain today to her low back.  She describes it as a dull ache, can become sharp at times.  She rates the low back pain as a 9/10 when it is at its worst, but a can drop down to about a 3/10.  She does have some good days and bad days.  When the pain first started this was also in her left leg traveling down the backside of her leg and then into the outer aspect of her foot.  However, after hip replacement, this was even worse, but then 2 weeks after her hip replacement she reports that the left leg pain has pretty much completely resolved.  The only thing at this time that is bothersome to her is the low back pain.  In regard to the low back pain, she reports that it is aggravated with sitting, standing, or walking.  She does report that climbing stairs are very difficult.  She reports that her symptoms are alleviated with Xanax or sitting.  The patient has also tried Tylenol, Celebrex, and Salonpas which has not given her too much relief.  She did try gabapentin in the past which made her groggy.  She did try physical therapy back in 2018 but has not tried anything recently.  Of note, the patient does have a local skin infection that Dr. Sharlene Motts has been  monitoring and wanted to hold off on physical therapy till this had improved.     The patient is denying bowel or bladder changes, saddle anesthesia, significant leg fatigue, or radiculopathy.    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    PAST SURGICAL HISTORY:  Aortic valve replacement, cesarean dissection, heart catheterization x2, history of hernia repair x3 in 2004, 2006, and 2008, left hip replacement in 2018 with Dr. Sharlene Motts, right hip replacement in May 2022 with Dr. Sharlene Motts.    MEDICATIONS:  1. Tylenol.  2. Hydrochlorothiazide.  3. Warfarin.  4. Metoprolol.  5. Aspirin.    ALLERGIES:  STADOL causes hallucination.  Does not report any allergy to latex.    SOCIAL HISTORY:  The patient has retired, last worked in 2019.  Denies nicotine use.  Does report seldom alcohol use.    FAMILY HISTORY:  The patient does have 2 sisters with lumbar disk problems.  Father with history of cancer.  Mother with history of atrial fibrillation.    REVIEW OF SYSTEMS:  The patient does report a fungal infection in the abdomen, does report joint discomfort in the right knee  and left foot, does report seasonal allergies.    PHYSICAL EXAMINATION:  Vitals:  Blood pressure 125/69, pulse 70, temperature 36.3 degrees Celsius, weight 100.6 kg, height 5 feet 4 inches.  On physical exam, [name] is a 65 year old female in no acute distress.  Gait is antalgic, without the cane she does favor the right side slightly.  Strength in lower extremities is good with ankle plantar and dorsiflexion, knee flexion and extension, hip flexion, abduction, adduction.  She does guard a little bit with hip abduction, adduction, but she was able to give me good endpoint.  In regard to lumbar range of motion, lumbar extension does aggravate her back pain, flexion alleviates it.  Side  bending is unremarkable.  Negative straight leg raise bilaterally.  Sensation grossly intact to light touch.  Reflexes are 2+ at patella and Achilles bilaterally.  Negative clonus bilaterally.  No tenderness over the greater trochanteric bursae bilaterally.  No midline tenderness in the low back.  Strength in upper extremities is good 5/5 with finger intrinsics, wrist flexion and extension, biceps, triceps, and deltoids.  Sensation grossly intact to light touch.  Reflexes are 2+ and symmetric in upper extremities.  No reverse brachioradialis.  Negative Hoffmann's bilaterally.    IMAGING:  Lumbar x-rays reviewed with the patient.  Lumbar x-rays reveal mild anterolisthesis of L4 on L5, does move a little with flexion.  Mild listhesis of L3 on L4.  Significant decreased disk height at L5-S1.  The patient does have a mild scoliotic curve.  Bilateral hip replacements present.      ASSESSMENT AND PLAN:  [name] is a 66 year old female with chief complaint of primarily low back pain today.  The patient previously did have leg discomfort, but this has improved since her most recent hip surgery.  I do want to get the patient into physical therapy for her low back.  If she continues to have low back pain, we could get an MRI or possible facet injections.  However, if the patient does develop new or worsening radiculopathy, then an MRI would certainly be warranted.  However, if physical therapy manages her symptoms then we can just stop there.  I do want to see the patient back in about another 6 weeks and see how she does with physical therapy.  She is going to coordinate this with Dr. Sharlene Motts as he wanted to hold off on physical therapy until skin infection was properly treated.  The patient was agreeable to this plan and did not have any other questions or concerns.  If she does develop any questions in the meantime, she can certainly call the office.    I have seen this patient independently.     Delana Meyer,  PA-C              CC:   Derek Jack, APRN, FNP-BC   [facility]   [address]   [place], [place] [address]       DD:  04/23/2021 08:02:44  DT:  04/23/2021 08:49:48 TP  D#:  [id number]

## 2021-04-24 ENCOUNTER — Ambulatory Visit (HOSPITAL_COMMUNITY): Payer: Self-pay

## 2021-04-24 ENCOUNTER — Encounter (HOSPITAL_COMMUNITY): Payer: Self-pay

## 2021-04-24 LAB — PT/INR: INR: 2.5

## 2021-04-24 NOTE — Progress Notes (Signed)
Anticoagulation Encounter Note:    Surgical AVR plan   Warfarin start date: 01-06-2019   HVI Staff: Earlie Raveling   DX : On-X AVR   INR Range: 1.5 - 2.0  Tab strength: warfarin 2mg    Contact 1: [phone] cell   Contact 2:   Lab: POC/[place] - [facility]   Comments: Tylenol regularly    INR: 2.5    Current Dose: 6 mg M,Th/ 4 mg all other days    Clinical Outcomes     Positives:  INR Above Goal (INR high. pt called. )        Patient Findings     Positives:  Change in health (continues to recouperate from hip surgery. saw derm who is not tx her rash. states rash looks better already. ), Change in medications (pt started on a new topical by dermatology)    Negatives:  Signs/symptoms of bleeding, Change in diet/appetite        Plan: warfarin has been being adjusted due to taking Diflucan. Has finished that now. Pt instructed to take warfarin 2 mg Th,Sa/ 4 mg F,Su and recheck INR on Monday. Dosing verified by pt.     Next INR Date: 04/28/21    Leone Brand, BSN RN  Specialty Care Nurse, Anticoagulation  [facility]  Ph. [phone]  (ext. [phone])  Fax [phone]  [email or web address]        For additional information, see dosing calendar below.    Warfarin Therapy Instructions   July 2022 Details    Sun Molli Knock Tue Wed Thu Fri Sat          1               2                 3               4               5    2.2   2 mg   See details      6      4 mg         7   2.5   2 mg   See details      8      4 mg         9      2 mg           10      4 mg         11      6 mg         12               13               14               15               16                 17               18               19               20                21  22               23                 24               25               26               27               28               29               30                 31                       Date Details   07/05 Last INR check   INR: 2.2      07/07 This INR check   INR: 2.5        Date of next INR:  04/28/2021

## 2021-04-28 ENCOUNTER — Ambulatory Visit (HOSPITAL_COMMUNITY): Payer: Self-pay

## 2021-04-28 ENCOUNTER — Encounter (HOSPITAL_COMMUNITY): Payer: Self-pay

## 2021-04-28 LAB — PT/INR: INR: 1.7

## 2021-04-28 NOTE — Progress Notes (Signed)
Anticoagulation Encounter Note:    Surgical AVR plan   Warfarin start date: 01-06-2019   [facility] Staff: Earlie Raveling   DX : On-X AVR   INR Range: 1.5 - 2.0  Tab strength: warfarin 2mg    Contact 1: [phone] cell   Contact 2:   Lab: POC/[place] - [facility]   Comments: Tylenol regularly    INR: 1.7    Current Dose: 6 mg M,Th/ 4 mg all other days    Clinical Outcomes     Positives:  INR In Range (INR good. pt called. )        Patient Findings     Positives:  Change in health (states rash almost 100% gone. )        Plan: pt to continue current dosing and recheck INR in one week. Dosing verified by pt.    Next INR Date: 05/05/21    Leone Brand, BSN RN  Specialty Care Nurse, Anticoagulation  [facility]  Ph. [phone]  (ext. [phone])  Fax [phone]  [email or web address]        For additional information, see dosing calendar below.    Warfarin Therapy Instructions   July 2022 Details    Sun Molli Knock Tue Wed Thu Fri Sat          1               2                 3               4               5               6               7    2.5   2 mg   See details      8      4 mg         9      2 mg           10      4 mg         11   1.7   6 mg   See details      12      4 mg         13      4 mg         14      6 mg         15      4 mg         16      4 mg           17      4 mg         18      6 mg         19               20               21               22               23                  24  25               26               27               28               29               30                 31                       Date Details   07/07 Last INR check   INR: 2.5      07/11 This INR check   INR: 1.7       Date of next INR:  05/05/2021

## 2021-05-05 ENCOUNTER — Ambulatory Visit (HOSPITAL_COMMUNITY): Payer: Self-pay

## 2021-05-05 ENCOUNTER — Encounter (HOSPITAL_COMMUNITY): Payer: Self-pay

## 2021-05-05 LAB — PT/INR: INR: 1.7

## 2021-05-05 NOTE — Progress Notes (Signed)
Anticoagulation Encounter Note:    Surgical AVR plan   Warfarin start date: 01-06-2019   HVI Staff: Earlie Raveling   DX : On-X AVR   INR Range: 1.5 - 2.0  Tab strength: warfarin 2mg    Contact 1: [phone] cell   Contact 2:   Lab: POC/[place] - [facility]   Comments: Tylenol regularly    INR: 1.7    Current Dose: 6 mg M,Th/ 4 mg all other days    Clinical Outcomes       Positives:  INR In Range (INR good. pt called. )             Plan:  pt to continue current dosing and recheck INR in two weeks. Dosing verified by pt.     Next INR Date: 05/15/21    Leone Brand, BSN RN  Specialty Care Nurse, Anticoagulation  [facility]  Ph. [phone]  (ext. [phone])  Fax [phone]  [email or web address]        For additional information, see dosing calendar below.    Warfarin Therapy Instructions   July 2022 Details    Sun [name] Thu Fri Sat          1               2                 3               4               5               6               7               8               9                 10               11    1.7   6 mg   See details      12      4 mg         13      4 mg         14      6 mg         15      4 mg         16      4 mg           17      4 mg         18   1.7   6 mg   See details      19      4 mg         20      4 mg         21      6 mg         22      4 mg         23      4 mg           24      4 mg         25      6 mg  26      4 mg         27      4 mg         28      6 mg         29               30                 31                       Date Details   07/11 Last INR check     INR: 1.7      07/18 This INR check     INR: 1.7       Date of next INR:  05/15/2021

## 2021-05-06 ENCOUNTER — Encounter (HOSPITAL_BASED_OUTPATIENT_CLINIC_OR_DEPARTMENT_OTHER): Payer: Self-pay | Admitting: Orthopaedic Surgery

## 2021-05-06 ENCOUNTER — Ambulatory Visit (HOSPITAL_COMMUNITY): Payer: Self-pay | Admitting: Cardiovascular Disease

## 2021-05-06 ENCOUNTER — Other Ambulatory Visit: Payer: Self-pay

## 2021-05-06 ENCOUNTER — Ambulatory Visit: Payer: Medicare Other | Attending: Orthopaedic Surgery | Admitting: Orthopaedic Surgery

## 2021-05-06 VITALS — BP 142/76 | HR 64 | Temp 96.8°F | Ht 64.96 in | Wt 223.8 lb

## 2021-05-06 DIAGNOSIS — L0390 Cellulitis, unspecified: Secondary | ICD-10-CM | POA: Insufficient documentation

## 2021-05-06 MED ORDER — CEPHALEXIN 500 MG CAPSULE
500.0000 mg | ORAL_CAPSULE | Freq: Four times a day (QID) | ORAL | 0 refills | Status: DC
Start: 2021-05-06 — End: 2021-07-24

## 2021-05-06 NOTE — Progress Notes (Signed)
Anticoagulation Encounter Note:    Surgical AVR plan   Warfarin start date: 01-06-2019   [facility] Staff: Earlie Raveling   DX : On-X AVR   INR Range: 1.5 - 2.0  Tab strength: warfarin 2mg    Contact 1: [phone] cell   Contact 2:   Lab: POC/[place] - [facility]   Comments: Tylenol regularly    INR: no INR for this encounter    Current Dose: 6 mg M,Th/ 4 mg all other days    Patient Findings     Positives:  Change in health (states has a small opening at her incision that Dr. Sharlene Motts is treating), Change in medications (started on Keflex 500 mg QID)        Plan: pt instructed to take warfarin 4 mg daily and recheck INR on Monday. Dosing verified by pt.     Next INR Date: 05/12/21    [name], BSN RN  Specialty Care Nurse, Anticoagulation  [facility]  Ph. [phone]  (ext. [phone])  Fax [phone]  [name]        For additional information, see dosing calendar below.    Warfarin Therapy Instructions   July 2022 Details    Sun Molli Knock Tue Wed Thu Fri Sat          1               2                 3               4               5               6               7               8               9                 10               11               12               13               14               15               16                 17               18    1.7   6 mg   See details      19      4 mg   See details      20      4 mg         21      4 mg         22      4 mg         23      4 mg           24      4 mg  25      6 mg         26               27               28               29               30                 31                       Date Details   07/18 Last INR check   INR: 1.7      07/19 This INR check       Date of next INR:  05/12/2021

## 2021-05-06 NOTE — Progress Notes (Signed)
[facility] for Joint Replacement    SUBJECTIVE:  The patient returns today 6 weeks status post right total Hip replacement.  He post-op course was complicated by psoriasis with erythema and rash about her abdomen and groin. She states she is significantly improved, bbut still has a small open area at the top of the wound with no drainage. She denies any fevers. The patient reports 1/10 pain.  Patient denies fever, chills or sweats.  Patient denies chest pain or shortness of breath.  The patient has completed aspirin for DVT prophylaxis.    EXAM:    BP (!) 142/76   Pulse 64   Temp 36 C (96.8 F)   Ht 1.65 m (5' 4.96")   Wt 101 kg (223 lb 12.3 oz)   BMI 37.28 kg/m       On exam, there is no obvious sign of infection.  There is no drainage.  The incision appears to be healing well overall with a  Small open area at the most proximal aspect. The erythema is significantly decreased since her last visit.  There is no obvious sign DVT.  Leg lengths appear to be equal.  Peroneal and tibial nerve it intact.  Gait appears to be normal.    X-RAYS:  Radiographs of the right hip performed last visit reveal a well aligned well fixed right total hip replacement.      IMPRESSION: Status post right total Hip replacement    PLAN:  Signs and symptoms of peri-prosthetic infection and thromboemobolism were re-iterated. Hip precautions were relaxed. The patient was encouraged to increase activity as tolerated.  Low impact activity was advised. She was given a prescription for keflex for possible mild cellulitis and to help prevent skin bacteria colonization of her wound. The patient may resume bathing in a tub or pool. The patient will follow up in 3 weeks with a right hip series.  The patient was encouraged to contact us with questions or concerns.    Gracelyn Nurse, MD  05/06/2021, 10:11      05/06/2021  I saw and examined the patient.  I reviewed the fellow's note.  I agree with the findings and plan of care as  documented in the fellow's note.  Any exceptions/additions are edited/noted.    [name], MD    Cc:    Derek Jack, APRN,FNP-BC  [place] [address]    Self, Referral  No address on file

## 2021-05-12 ENCOUNTER — Ambulatory Visit (HOSPITAL_COMMUNITY): Payer: Self-pay

## 2021-05-12 ENCOUNTER — Encounter (HOSPITAL_COMMUNITY): Payer: Self-pay

## 2021-05-12 LAB — PT/INR: INR: 1.7

## 2021-05-12 NOTE — Progress Notes (Signed)
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 pt called. )          Plan:  pt to continue current dosing and recheck INR in two weeks. Dosing verified by pt.     Next INR Date: 05/26/21    Leone Brand, BSN RN  Specialty Care Nurse, Anticoagulation  [facility]  Ph. [phone]  (ext. [phone])  Fax [phone]  [email or web address]        For additional information, see dosing calendar below.    Warfarin Therapy Instructions   July 2022 Details    Sun Molli Knock Tue Wed Thu Fri Sat          '1               2                 3               4               5               6               7               8               9                 10               11               12               13               14               15               16                 17               18               19      4 '$ mg   See details      20      4 mg         21      4 mg         22      4 mg         23      4 mg           24      4 mg         25   1.7   6 mg   See details      26  4 mg         27      4 mg         28      6 mg         29      4 mg         30      4 mg           31      4 mg                Date Details   07/19 Last INR check      07/25 This INR check   INR: 1.7              Warfarin Therapy Instructions   August 2022 Details    Sun Mon Tue Wed Thu Fri Sat      1      6 mg         2      4 mg         3      4 mg         4      6 mg         5      4 mg         6      4 mg           7      4 mg         8      6 mg         '9               10               11               12               13                 14               15               16                17               18               19               20                 21               22               23               24               25               26               27                 28               '$ 29  30               31                   Date Details   No additional details    Date of next INR:  05/26/2021

## 2021-05-26 ENCOUNTER — Encounter (HOSPITAL_COMMUNITY): Payer: Self-pay

## 2021-05-26 ENCOUNTER — Ambulatory Visit (HOSPITAL_COMMUNITY): Payer: Self-pay

## 2021-05-26 LAB — PT/INR: INR: 1.5

## 2021-05-26 NOTE — Progress Notes (Signed)
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 pt called. )          Plan:  pt to continue current dosing and recheck INR in two weeks. Dosing verified by pt. Pt was just away for a couple weeks not eating same as home.     Next INR Date: 06/09/21    Leone Brand, BSN RN  Specialty Care Nurse, Anticoagulation  [facility]  Ph. [phone]  (ext. [phone])  Fax [phone]  [email or web address]        For additional information, see dosing calendar below.    Warfarin Therapy Instructions   July 2022 Details    Sun [name] Thu Fri Sat          '1               2                 3               4               5               6               7               8               9                 10               11               12               13               14               15               16                 17               18               19               20               21               22               23                 24               25   '$ 1.7   6 mg   See details  26      4 mg         27      4 mg         28      6 mg         29      4 mg         30      4 mg           31      4 mg                Date Details   07/25 Last INR check   INR: 1.7              Warfarin Therapy Instructions   August 2022 Details    Sun Mon Tue Wed Thu Fri Sat      1      6 mg         2      4 mg         3      4 mg         4      6 mg         5      4 mg         6      4 mg           7      4 mg         8   1.5   6 mg   See details      9      4 mg         10      4 mg         11      6 mg         12      4 mg         13      4 mg           14      4 mg          15      6 mg         16      4 mg         17      4 mg         18      6 mg         19      4 mg         20      4 mg           21      4 mg         22      6 mg         '23               24               25               26               27                 '$ 28  $'29               30               31                   'a$ Date Details   08/08 This INR check   INR: 1.5       Date of next INR:  06/09/2021

## 2021-05-27 ENCOUNTER — Other Ambulatory Visit: Payer: Self-pay

## 2021-05-27 ENCOUNTER — Ambulatory Visit: Payer: Medicare Other | Attending: Orthopaedic Surgery | Admitting: Orthopaedic Surgery

## 2021-05-27 ENCOUNTER — Inpatient Hospital Stay (HOSPITAL_BASED_OUTPATIENT_CLINIC_OR_DEPARTMENT_OTHER)
Admission: RE | Admit: 2021-05-27 | Discharge: 2021-05-27 | Disposition: A | Discharge status: Home / Self Care | Payer: Medicare Other | Source: Ambulatory Visit | Admitting: Radiology

## 2021-05-27 ENCOUNTER — Encounter (HOSPITAL_BASED_OUTPATIENT_CLINIC_OR_DEPARTMENT_OTHER): Payer: Self-pay | Admitting: Orthopaedic Surgery

## 2021-05-27 VITALS — BP 130/78 | HR 82 | Temp 97.3°F | Ht 64.57 in | Wt 222.4 lb

## 2021-05-27 DIAGNOSIS — Z96641 Presence of right artificial hip joint: Secondary | ICD-10-CM

## 2021-05-27 DIAGNOSIS — Z9889 Other specified postprocedural states: Secondary | ICD-10-CM

## 2021-05-27 NOTE — Progress Notes (Signed)
[place]  [place] [address]  Operated by [facility]    SUBJECTIVE  [name] is a 65 y.o. female who returns to clinic today post operatively from a right total hip arthroplasty which was performed on 03/13/2021.  The patient is doing well post operatively. The patient reports no pain in her right hip.  Denies any drainage from the incision site. 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    REVIEW OF SYSTEMS  General: negative for fevers or night sweats  Cardiovascular: negative for chest pain  Respiratory: negative for shortness of breath  Gastrointestinal: negative for digestive problems  Genitourinary: negative for urinary problems  Musculoskeletal: negative for hip pain, negative knee pain     All other systems reviewed and negative    PHYSICAL EXAMINATION   BP 130/78   Pulse 82   Temp 36.3 C (97.3 F)   Ht 1.64 m (5' 4.57")   Wt 101 kg (222 lb 7.1 oz)   BMI 37.51 kg/m     GEN:   NAD  EYES: Sclera white.  PULM:   Normal respiratory effort.    MS:  Stable gait.   NEURO:   Alert and oriented to person, place and time.    PSYCHOSOCIAL:   Pleasant.  Normal affect.    WOUND/INCISION:   Wound margins intact and well healed. No signs of erythema, ecchymosis, hypergranulation, hernia or drainage.  1 small skin tear present proximal to her incision.      RADIOGRAPHS  Radiographs were performed and reviewed at this visit.    X-Ray of right hip was interpreted in clinic to show well positioned and aligned implants. No evidence of loosening, stem subsidence, fracture or dislocation.     ASSESSMENT     ICD-10-CM    1. Status post total replacement of right hip  Z96.641 Right Eloy Series x-ray       PLAN  [name] is almost 3 months s/p right total hip arthroplasty. she is doing well post operatively.     The patient will need antibiotic prophylaxis before future procedures.     The patient was told that she  will have a follow up visit scheduled in the [facility] in May 2023 at which time right hip x-rays will be taken. The patient was instructed to call with any new or concerning symptoms. All of the patient's questions were answered and she agreed to this plan.    A shared visit was performed with the co-signing physician.  Lanetta Inch Seifried, PA-C 05/27/2021, 14:14     Cc:   Derek Jack, APRN,FNP-BC  [address]  [place] [address]    I personally saw and evaluated the patient. See mid-level's note for additional details. My findings/participation are well healed incision, great recovery despite setback from inverse psoriasis.    [name], MD

## 2021-05-28 DIAGNOSIS — Z96641 Presence of right artificial hip joint: Secondary | ICD-10-CM

## 2021-06-06 ENCOUNTER — Encounter (HOSPITAL_BASED_OUTPATIENT_CLINIC_OR_DEPARTMENT_OTHER): Payer: Self-pay | Admitting: Family

## 2021-06-09 ENCOUNTER — Ambulatory Visit (HOSPITAL_COMMUNITY): Payer: Self-pay

## 2021-06-09 ENCOUNTER — Encounter (HOSPITAL_COMMUNITY): Payer: Self-pay

## 2021-06-09 LAB — PT/INR: INR: 1.9

## 2021-06-11 NOTE — Progress Notes (Signed)
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 LVM on cell)          Plan:  pt to continue current dosing and recheck INR in two weeks. Number left for pt to call with questions.       Next INR Date: 06/24/21    Leone Brand, BSN RN  Specialty Care Nurse, Anticoagulation  [facility]  Ph. [phone]  (ext. [phone])  Fax [phone]  [email or web address]            For additional information, see dosing calendar below.    Warfarin Therapy Instructions   August 2022 Details    Sun [name] Thu Fri Sat      '1               2               3               4               5               6                 7               8   '$ 1.5   6 mg   See details      9      4 mg         10      4 mg         11      6 mg         12      4 mg         13      4 mg           14      4 mg         15      6 mg         16      4 mg         17      4 mg         18      6 mg         19      4 mg         20      4 mg           21      4 mg         22   1.9   6 mg   See details      23      4 mg         24      4 mg         25  6 mg         26      4 mg         27      4 mg           28      4 mg         29      6 mg         30      4 mg         31      4 mg             Date Details   08/08 Last INR check   INR: 1.5      08/22 This INR check   INR: 1.9              Warfarin Therapy Instructions   September 2022 Details    Sun Mon Tue Wed Thu Fri Sat         1      6 mg         2      4 mg         3      4 mg           4      4 mg         5      6 mg         6      4 mg         '7               8               9               10                 11               12                13               14               15               16               17                 18               19               20               21               22               23               24                 25               26               27               '$ 28  29               30                 Date Details   No additional details    Date of next INR:  06/24/2021

## 2021-06-12 ENCOUNTER — Telehealth (HOSPITAL_BASED_OUTPATIENT_CLINIC_OR_DEPARTMENT_OTHER): Payer: Self-pay | Admitting: Orthopaedic Surgery

## 2021-06-12 NOTE — Telephone Encounter (Signed)
Returned call.  She states on Sunday she slipped and fell on her right knee  She says she feels a little sore but no significant changes she just wanted to let me know.  Advised if any changes, increased etc to let us know.  She verbalized understanding and thanked me for the call.

## 2021-06-24 ENCOUNTER — Ambulatory Visit (HOSPITAL_COMMUNITY): Payer: Self-pay

## 2021-06-24 ENCOUNTER — Encounter (HOSPITAL_COMMUNITY): Payer: Self-pay

## 2021-06-24 LAB — PT/INR: INR: 1.9

## 2021-06-24 NOTE — Progress Notes (Signed)
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 pt called. )          Plan:  pt to continue current dosing and recheck INR in two weeks. Dosing verified by pt.     Next INR Date: 07/07/21    Leone Brand, BSN RN  Specialty Care Nurse, Anticoagulation  [facility]  Ph. [phone]  (ext. [phone])  Fax [phone]  [email or web address]        For additional information, see dosing calendar below.    Warfarin Therapy Instructions   August 2022 Details    Sun [name] Thu Fri Sat      '1               2               3               4               5               6                 7               8               9               10               11               12               13                 14               15               16               17               18               19               20                 21               22   '$ 1.9   6 mg   See details      23      4 mg         24      4 mg         25      6 mg         26      4 mg  27      4 mg           28      4 mg         29      6 mg         30      4 mg         31      4 mg             Date Details   08/22 Last INR check   INR: 1.9              Warfarin Therapy Instructions   September 2022 Details    Sun Mon Tue Wed Thu Fri Sat         1      6 mg         2      4 mg         3      4 mg           4      4 mg         5      6 mg         6   1.9   4 mg   See details      7      4 mg         8      6 mg         9      4 mg         10      4 mg           11      4 mg         12      6 mg         13      4 mg         14      4 mg         15      6 mg         16      4 mg         17       4 mg           18      4 mg         19      6 mg         '20               21               22               23               24                 25               26               27               28               '$ 29  30                 Date Details   09/06 This INR check   INR: 1.9       Date of next INR:  07/07/2021

## 2021-07-02 ENCOUNTER — Other Ambulatory Visit (HOSPITAL_BASED_OUTPATIENT_CLINIC_OR_DEPARTMENT_OTHER): Payer: Self-pay | Admitting: Family

## 2021-07-02 DIAGNOSIS — L409 Psoriasis, unspecified: Secondary | ICD-10-CM

## 2021-07-02 MED ORDER — CLOBETASOL 0.05 % SCALP SOLUTION
Freq: Two times a day (BID) | 2 refills | Status: DC
Start: 2021-07-02 — End: 2021-07-15

## 2021-07-10 ENCOUNTER — Encounter (HOSPITAL_COMMUNITY): Payer: Self-pay

## 2021-07-10 ENCOUNTER — Ambulatory Visit (HOSPITAL_COMMUNITY): Payer: Self-pay

## 2021-07-10 LAB — PT/INR: INR: 2.1

## 2021-07-10 NOTE — Progress Notes (Signed)
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 pt called. )        Patient Findings     Negatives:  Change in health, Change in medications, Change in diet/appetite        Plan: pt instructed to take warfarin 4 mg once tonight then resume current dosing and recheck INR in two weeks. Dosing verified by pt.    Next INR Date: 07/22/21    Leone Brand, BSN RN  Specialty Care Nurse, Anticoagulation  [facility]  Ph. [phone]  (ext. [phone])  Fax [phone]  [email or web address]        For additional information, see dosing calendar below.    Warfarin Therapy Instructions   September 2022 Details    Sun Molli Knock Tue Wed Thu Fri Sat         1               2               3                 4               5               6    1.9   4 mg   See details      7      4 mg         8      6 mg         9      4 mg         10      4 mg           11      4 mg         12      6 mg         13      4 mg         14      4 mg         15      6 mg         16      4 mg         17      4 mg           18      4 mg         19      6 mg         20      4 mg         21      4 mg         22   2.1   4 mg   See details      23      4 mg  24      4 mg           25      4 mg         26      6 mg         27      4 mg         28      4 mg         29      6 mg         30      4 mg           Date Details   09/06 Last INR check   INR: 1.9      09/22 This INR check   INR: 2.1              Warfarin Therapy Instructions   October 2022 Details    Sun Mon Tue Wed Thu Fri Sat           1      4 mg           2      4 mg         3      6 mg         4      4 mg         5               6                7               8                 9               10               11               12               13               14               15                 16               17               18               19               20               21               22                 23               24               25               26                27  28               29                 30               31                      Date Details   No additional details    Date of next INR:  07/22/2021                Late entry for 07/10/21. I saw and examined the patient.  I reviewed the resident's note.  I agree with the findings and plan of care as documented in the resident's note.  Any exceptions/additions are edited/noted.    Joaquin Bend, MD

## 2021-07-11 ENCOUNTER — Other Ambulatory Visit (HOSPITAL_COMMUNITY): Payer: Self-pay | Admitting: Hospitalist

## 2021-07-11 ENCOUNTER — Other Ambulatory Visit (HOSPITAL_COMMUNITY): Payer: Self-pay | Admitting: Cardiovascular Disease

## 2021-07-11 ENCOUNTER — Other Ambulatory Visit (HOSPITAL_BASED_OUTPATIENT_CLINIC_OR_DEPARTMENT_OTHER): Payer: Self-pay | Admitting: Family Medicine

## 2021-07-11 DIAGNOSIS — I10 Essential (primary) hypertension: Secondary | ICD-10-CM

## 2021-07-11 NOTE — Telephone Encounter (Signed)
Patient is not established at [facility]. Will need appointment for medication refills. Melvyn Novas, RN  07/11/2021, 15:00

## 2021-07-13 ENCOUNTER — Encounter (HOSPITAL_COMMUNITY): Payer: Self-pay | Admitting: Hospitalist

## 2021-07-14 ENCOUNTER — Other Ambulatory Visit (HOSPITAL_COMMUNITY): Payer: Self-pay | Admitting: Hospitalist

## 2021-07-14 MED ORDER — METOPROLOL SUCCINATE ER 50 MG TABLET,EXTENDED RELEASE 24 HR
50.0000 mg | ORAL_TABLET | Freq: Every day | ORAL | 4 refills | Status: DC
Start: 2021-07-14 — End: 2022-02-10

## 2021-07-15 ENCOUNTER — Ambulatory Visit (HOSPITAL_BASED_OUTPATIENT_CLINIC_OR_DEPARTMENT_OTHER): Payer: Self-pay | Admitting: Family Medicine

## 2021-07-15 ENCOUNTER — Ambulatory Visit (HOSPITAL_BASED_OUTPATIENT_CLINIC_OR_DEPARTMENT_OTHER): Payer: Medicare Other | Admitting: Gynecology

## 2021-07-15 ENCOUNTER — Ambulatory Visit (HOSPITAL_BASED_OUTPATIENT_CLINIC_OR_DEPARTMENT_OTHER): Payer: Medicare Other

## 2021-07-15 ENCOUNTER — Other Ambulatory Visit: Payer: Self-pay

## 2021-07-15 ENCOUNTER — Ambulatory Visit: Payer: Medicare Other | Attending: Family | Admitting: Family

## 2021-07-15 ENCOUNTER — Encounter (HOSPITAL_BASED_OUTPATIENT_CLINIC_OR_DEPARTMENT_OTHER): Payer: Self-pay | Admitting: Family

## 2021-07-15 VITALS — BP 132/76 | HR 83 | Ht 64.0 in | Wt 225.5 lb

## 2021-07-15 DIAGNOSIS — Z1231 Encounter for screening mammogram for malignant neoplasm of breast: Secondary | ICD-10-CM

## 2021-07-15 DIAGNOSIS — I10 Essential (primary) hypertension: Secondary | ICD-10-CM

## 2021-07-15 DIAGNOSIS — L409 Psoriasis, unspecified: Secondary | ICD-10-CM | POA: Insufficient documentation

## 2021-07-15 DIAGNOSIS — D649 Anemia, unspecified: Secondary | ICD-10-CM

## 2021-07-15 DIAGNOSIS — Z23 Encounter for immunization: Secondary | ICD-10-CM | POA: Insufficient documentation

## 2021-07-15 LAB — CBC WITH DIFF
BASOPHIL #: <0.1 10*3/uL (ref <=0.20)
BASOPHIL %: 0 %
EOSINOPHIL #: <0.1 10*3/uL (ref <=0.50)
EOSINOPHIL %: 2 %
HCT: 41.5 % (ref 34.8–46.0)
HGB: 14.1 g/dL (ref 11.5–16.0)
IMMATURE GRANULOCYTE #: <0.1 10*3/uL (ref <0.10)
IMMATURE GRANULOCYTE %: 1 % (ref 0–1)
LYMPHOCYTE #: 1.77 10*3/uL (ref 1.00–4.80)
LYMPHOCYTE %: 31 %
MCH: 29.9 pg (ref 26.0–32.0)
MCHC: 34 g/dL (ref 31.0–35.5)
MCV: 87.9 fL (ref 78.0–100.0)
MONOCYTE #: 0.54 10*3/uL (ref 0.20–1.10)
MONOCYTE %: 9 %
MPV: 9 fL (ref 8.7–12.5)
NEUTROPHIL #: 3.29 10*3/uL (ref 1.50–7.70)
NEUTROPHIL %: 57 %
PLATELETS: 286 10*3/uL (ref 150–400)
RBC: 4.72 10*6/uL (ref 3.85–5.22)
RDW-CV: 12.9 % (ref 11.5–15.5)
WBC: 5.7 10*3/uL (ref 3.7–11.0)

## 2021-07-15 LAB — BASIC METABOLIC PANEL
ANION GAP: 6 mmol/L (ref 4–13)
BUN/CREA RATIO: 25 — ABNORMAL HIGH (ref 6–22)
BUN: 21 mg/dL (ref 8–25)
CALCIUM: 9.4 mg/dL (ref 8.8–10.2)
CHLORIDE: 99 mmol/L (ref 96–111)
CO2 TOTAL: 27 mmol/L (ref 23–31)
CREATININE: 0.83 mg/dL (ref 0.60–1.05)
ESTIMATED GFR: 78 mL/min/BSA (ref >=60)
GLUCOSE: 92 mg/dL (ref 65–125)
POTASSIUM: 3.7 mmol/L (ref 3.5–5.1)
SODIUM: 132 mmol/L — ABNORMAL LOW (ref 136–145)

## 2021-07-15 MED ORDER — CLOBETASOL 0.05 % TOPICAL CREAM
TOPICAL_CREAM | Freq: Two times a day (BID) | CUTANEOUS | 1 refills | Status: DC
Start: 2021-07-15 — End: 2022-02-12

## 2021-07-15 MED ORDER — CLOBETASOL 0.05 % SCALP SOLUTION
Freq: Two times a day (BID) | 2 refills | Status: DC
Start: 2021-07-15 — End: 2021-07-15

## 2021-07-15 MED ORDER — HYDROCHLOROTHIAZIDE 25 MG TABLET
25.0000 mg | ORAL_TABLET | Freq: Every day | ORAL | 4 refills | Status: DC
Start: 2021-07-15 — End: 2022-02-12

## 2021-07-15 MED ORDER — CLOBETASOL 0.05 % SCALP SOLUTION
Freq: Two times a day (BID) | 2 refills | Status: AC
Start: 2021-07-15 — End: 2022-01-12

## 2021-07-15 NOTE — Patient Instructions (Signed)
VACCINE INFORMATION FACT SHEET FOR RECIPIENTS AND CAREGIVERS ABOUT COMIRNATY (COVID-19 VACCINE, mRNA), THE PFIZER-BIONTECH COVID-19 VACCINE, AND THE PFIZER-BIONTECH COVID-19 VACCINE, BIVALENT (ORIGINAL AND OMICRON BA.4/BA.5) TO PREVENT CORONAVIRUS DISEASE 2019 (COVID-19) FOR USE IN INDIVIDUALS 66 YEARS OF AGE AND OLDER            You are being offered either COMIRNATY (COVID-19 Vaccine, mRNA), the Pfizer-BioNTech COVID-19 Vaccine, or the Pfizer-BioNTech COVID-19 Vaccine, Bivalent (Original and Omicron BA.4/BA.5), hereafter referred to as the Pfizer-BioNTech COVID-19 Vaccine, Bivalent, to prevent Coronavirus Disease 2019 (COVID-19) caused by SARS-CoV-2.   This Vaccine Information Fact Sheet for Recipients and Caregivers comprises the Fact Sheet for the authorized Pfizer-BioNTech COVID-19 Vaccine and the Pfizer-BioNTech COVID-19 Vaccine, Bivalent, and also includes information about the U.S. Food and Drug Administration (FDA)-licensed vaccine, COMIRNATY (COVID-19 Vaccine, mRNA) for use in individuals 66 years of age and [name].   The FDA-approved COMIRNATY (COVID-19 Vaccine, mRNA) and the Pfizer-BioNTech COVID-19 Vaccine authorized under Emergency Use Authorization (EUA) for individuals 69 years of age and older, when prepared according to their respective instructions for use, can be used interchangeably.2      1 You may receive this Vaccine Information Fact Sheet even if your child is 73 years old. Children who will turn from 11 years to 83 years of age between doses in the primary regimen may receive, for any dose in the primary regimen, either: (1) the Pfizer-BioNTech COVID-19 Vaccine authorized for use in individuals 5 through 66 years of age; or (2) COMIRNATY (COVID-19 Vaccine, mRNA) or the Pfizer-BioNTech COVID-19 Vaccine authorized for use in individuals 23 years of age and older.   2 When prepared according to their respective instructions for use, the FDA-approved COMIRNATY (COVID-19 Vaccine, mRNA) and  the EUA-authorized Pfizer-BioNTech COVID-19 Vaccine for individuals 46 years of age and older can be used interchangeably without presenting any safety or effectiveness concerns.        COMIRNATY (COVID-19 Vaccine, mRNA) is an FDA-approved COVID-19   vaccine made by [employer] for [employer]. It is approved as a 2-dose series for   prevention of COVID-19 in individuals 31 years of age and older. It is also   authorized under EUA to provide:   . a third primary series dose to individuals 41 years of age and older with certain kinds of immunocompromise.     The Pfizer-BioNTech COVID-19 Vaccine has received EUA from FDA to   provide:   . a 2-dose primary series to individuals 72 years of age and older; and   . a third primary series dose to individuals 63 years of age and older with   certain kinds of immunocompromise.   The Pfizer-BioNTech COVID-19 Vaccine, Bivalent has received EUA from FDA to provide either:   . a single booster dose to individuals 30 years of age and older at least 2 months after completion of primary vaccination with any authorized or approved monovalent3 COVID-19 vaccine; or   . a single booster dose to individuals 32 years of age and older at least 2 months after receipt of the most recent booster dose with any authorized or approved monovalent COVID-19 vaccine.   This Vaccine Information Fact Sheet contains information to help you understand the   risks and benefits of COMIRNATY (COVID-19 Vaccine, mRNA) and the   Pfizer-BioNTech COVID-19 Vaccine, which you may receive because there is currently   a pandemic of COVID-19. Talk to your vaccination provider if you have questions.    This Fact Sheet may have been updated. For  the most recent Fact Sheet, please see   [email or web address].hu.     WHAT YOU NEED TO KNOW BEFORE YOU GET THIS VACCINE    WHAT IS COVID-19?  COVID-19 disease is caused by a coronavirus called SARS-CoV-2. You can get   COVID-19 through contact with another person who has the virus. It is  predominantly a   respiratory illness that can affect other organs. People with COVID-19 have had a wide   range of symptoms reported, ranging from mild symptoms to severe illness leading to   death. Symptoms may appear 2 to 14 days after exposure to the virus. Symptoms may   include: fever or chills; cough; shortness of breath; fatigue; muscle or body aches;   headache; new loss of taste or smell; sore throat; congestion or runny nose; nausea or   vomiting; diarrhea.            HOW ARE COMIRNATY (COVID-19 VACCINE, mRNA), THE PFIZER-BIONTECH COVID-19 VACCINE, AND THE PFIZER-BIONTECH COVID-19 VACCINE, BIVALENT RELATED?   COMIRNATY (COVID-19 Vaccine, mRNA) and the Pfizer-BioNTech COVID-19 Vaccine, when prepared according to their respective instructions for use, can be used interchangeably. The Pfizer-BioNTech COVID-19 Vaccine, Bivalent is made in contains an Omicron component to help prevent COVID-19 caused by the Omicron variant of SARS-CoV-2. the same way as COMIRNATY and Pfizer-BioNTech COVID-19 Vaccine but it also contains an Omicron component to help prevent COVID-19 caused by the Omicron variant of SARS-CoV-2.   For more information on EUA, see the "What is an Emergency Use Authorization   (EUA)?" section at the end of this Fact Sheet.    WHAT SHOULD YOU MENTION TO YOUR VACCINATION PROVIDER BEFORE   YOU GET THE VACCINE?  Tell the vaccination provider about all of your medical conditions, including if   you:  . have any allergies   . have had myocarditis (inflammation of the heart muscle) or pericarditis   (inflammation of the lining outside the heart)  . have a fever  . have a bleeding disorder or are on a blood thinner  . are immunocompromised or are on a medicine that affects your immune system  . are pregnant or plan to become pregnant  . are breastfeeding  . have received another COVID-19 vaccine  . have ever fainted in association with an injection    HOW IS THE VACCINE GIVEN?  The Pfizer-BioNTech COVID-19  Vaccine, the Pfizer-BioNTech COVID-19 Vaccine, Bivalent, or COMIRNATY (COVID-19 Vaccine, mRNA) will be given to you as an injection into the muscle.   Primary Series: The Pfizer-BioNTech COVID-19 Vaccine and COMIRNATY (COVID-19 Vaccine, mRNA) are given for the primary series. The vaccine is administered as a 2-dose series, 3 weeks apart. A third primary series dose may be administered at least 4 weeks after the second dose to individuals with certain kinds of immunocompromise.   Booster Dose: Pfizer-BioNTech COVID-19 Vaccine, Bivalent is administered as a single booster dose at least 2 months after: . completion of primary vaccination with any authorized or approved monovalent COVID-19 vaccine; or . receipt of the most recent booster dose with any authorized or approved monovalent COVID-19 vaccine   The vaccine may not protect everyone.   WHO SHOULD NOT GET COMIRNATY (COVID-19 VACCINE, mRNA), THE PFIZER-BIONTECH COVID-19 VACCINE, OR THE PFIZER-BIONTECH COVID-19 VACCINE, BIVALENT?   You should not get any of these vaccines if you: . had a severe allergic reaction after a previous dose of COMIRNATY (COVID-19 Vaccine, mRNA) or the Pfizer-BioNTech COVID-19 Vaccine . had a severe allergic reaction to any  ingredient in these vaccines.     WHAT ARE THE INGREDIENTS IN THESE VACCINES?   COMIRNATY (COVID-19 Vaccine, mRNA), Pfizer-BioNTech COVID-19 Vaccine, and Pfizer-BioNTech COVID-19 Vaccine, Bivalent include the following ingredients:   . mRNA and lipids (((4-hydroxybutyl)azanediyl)bis(hexane-6,1-diyl)bis(2  hexyldecanoate), 2 [(polyethylene glycol)-2000]-N,N-ditetradecylacetamide,   1,2-Distearoyl-sn-glycero-3-phosphocholine, and cholesterol).   Pfizer-BioNTech COVID-19 Vaccine for individuals 69 years of age and older contains 1 of the following sets of additional ingredients; ask the vaccination provider which version is being administered:   . potassium chloride, monobasic potassium phosphate, sodium chloride, dibasic  sodium phosphate dihydrate, and sucrose OR. tromethamine, tromethamine hydrochloride, and sucrose     WHAT ARE THE INGREDIENTS IN THE VACCINES?  COMIRNATY (COVID-19 Vaccine, mRNA) and the authorized formulations of the   vaccine include the following ingredients:   . mRNA and lipids ((4-hydroxybutyl)azanediyl)bis(hexane-6,1-diyl)bis(2-  hexyldecanoate), 2 [(polyethylene glycol)-2000]-N,N-ditetradecylacetamide, 1,2-  Distearoyl-sn-glycero-3-phosphocholine, and cholesterol).    Pfizer-BioNTech COVID-19 vaccines for individuals 46 years of age and older contain   1 of the following sets of additional ingredients; ask the vaccination provider which   version is being administered:  . potassium chloride, monobasic potassium phosphate, sodium chloride, dibasic   sodium phosphate dihydrate, and sucrose  OR  . tromethamine, tromethamine hydrochloride, and sucrose    Pfizer-BioNTech COVID-19 Vaccine, Bivalent for individuals 46 years of age and older contains the following additional ingredients:   . tromethamine, tromethamine hydrochloride, and sucrose       COMIRNATY (COVID-19 Vaccine, mRNA) contains 1 of the following sets of additional ingredients; ask the vaccination provider which version is being administered:   . potassium chloride, monobasic potassium phosphate, sodium chloride, dibasic sodium phosphate dihydrate, and sucrose OR  . tromethamine, tromethamine hydrochloride, and sucrose   HAVE THESE VACCINES BEEN USED BEFORE?   In clinical trials, approximately 23,000 individuals 18 years of age and older have received at least 1 dose of Pfizer-BioNTech COVID-19 Vaccine. Millions of individuals have received the Pfizer-BioNTech COVID-19 Vaccine under EUA since September 29, 2019.   In a clinical trial, approximately 300 individuals greater than 25 years of age received one dose of a bivalent vaccine that differs from the Pfizer-BioNTech COVID-19 Vaccine, Bivalent in that it contains a different Omicron component.    WHAT ARE THE BENEFITS OF THESE VACCINES?   COMIRNATY (COVID-19 Vaccine, mRNA) and the Pfizer-BioNTech COVID-19 Vaccine have been shown to prevent COVID-19. FDA has authorized Pfizer-BioNTech COVID-19 Vaccine, Bivalent to provide better protection against COVID-19 caused by the Omicron variant of SARS-CoV-2.   The duration of protection against COVID-19 is currently unknown.           WHAT ARE THE RISKS OF THE VACCINE?  WHAT ARE THE RISKS OF THESE VACCINES?     There is a remote chance that these vaccines could cause a severe allergic reaction. A severe allergic reaction would usually occur within a few minutes to 1 hour after getting a dose. For this reason, your vaccination provider may ask you to stay at the place where you received your vaccine for monitoring after vaccination. Signs of a severe allergic reaction can include:   Marland Kitchen Difficulty breathing   . Swelling of your face and throat   . A fast heartbeat   . A bad rash all over your body   . Dizziness and weakness          Myocarditis (inflammation of the heart muscle) and pericarditis (inflammation of the lining outside the heart) have occurred in some people who have received COMIRNATY (COVID-19 Vaccine, mRNA) or  Pfizer-BioNTech COVID-19 Vaccine, more commonly in adolescent males and adult males under 6 years of age than among females and older males. In most of these people, symptoms began within a few days following receipt of the second dose of vaccine. The chance of having this occur is very low. You should seek medical attention right away if you have any of the following symptoms after receiving the vaccine:   . Chest pain   . Shortness of breath   . Feelings of having a fast-beating, fluttering, or pounding heart   . Chest pain  . Shortness of breath  . Feelings of having a fast-beating, fluttering, or pounding heart    Side effects that have been reported with these vaccines include:   . Severe allergic reactions   . Non-severe allergic reactions  such as rash, itching, hives, or swelling of the face   . Myocarditis (inflammation of the heart muscle)   . Pericarditis (inflammation of the lining outside the heart)   . Injection site pain   . Tiredness   . Headache   . Muscle pain   . Chills   . Joint pain   . Fever   . Injection site swelling   . Injection site redness   . Nausea   . Feeling unwell   . Swollen lymph nodes (lymphadenopathy)   . Decreased appetite   . Diarrhea   . Vomiting   . Arm pain   . Fainting in association with injection of the vaccine   These may not be all the possible side effects of these vaccines. Serious and unexpected side effects may occur. The possible side effects of these vaccines are still being studied.         WHAT SHOULD I DO ABOUT SIDE EFFECTS?  If you experience a severe allergic reaction, call [phone], or go to the nearest hospital.    Call the vaccination provider or your healthcare provider if you have any side effects   that bother you or do not go away.    Report vaccine side effects to FDA/CDC Vaccine Adverse Event Reporting System (VAERS). The VAERS toll-free number is 650-698-1487 or report online to [email or web address]/. Please include either "COMIRNATY (COVID-19 Vaccine, mRNA)", "Pfizer-BioNTech COVID-19 Vaccine EUA", or "Pfizer-BioNTech COVID-19 Vaccine, Bivalent EUA" as appropriate, in the first line of box #18 of the report form.   In addition, you can report side effects to [employer]. at the contact information provided   Below.      You may also be given an option to enroll in v-safe. V-safe is a new voluntary   smartphone-based tool that uses text messaging and web surveys to check in with   people who have been vaccinated to identify potential side effects after COVID-19   vaccination. V-safe asks questions that help CDC monitor the safety of COVID-19   vaccines. V-safe also provides second-dose reminders if needed and live telephone   follow-up by CDC if participants report a  significant health impact following COVID-19   vaccination. For more information on how to sign up, [email or web address]      WHAT IF I DECIDE NOT TO GET COMIRNATY (COVID-19 VACCINE, mRNA), THE PFIZER-BIONTECH COVID-19 VACCINE, OR THE PFIZER-BIONTECH COVID-19 VACCINE, BIVALENT?   Under the EUA, it is your choice to receive or not receive any of these vaccines. Should you decide not to receive any of these vaccines, it will not change your standard medical care.   ARE OTHER CHOICES AVAILABLE FOR PREVENTING COVID-19 BESIDES  COMIRNATY (COVID-19 VACCINE, mRNA), THE PFIZER-BIONTECH COVID-19 VACCINE, OR THE PFIZER-BIONTECH COVID-19 VACCINE, BIVALENT?   For primary vaccination, another choice for preventing COVID-19 is SPIKEVAX (COVID-19 Vaccine, mRNA), an FDA-approved COVID-19 vaccine. Other vaccines to prevent COVID-19 may be available under EUA, including bivalent vaccines that contain an Omicron component of SARS-CoV-2.             CAN I RECEIVE COMIRNATY (COVID-19 VACCINE, mRNA), PFIZER-BIONTECH COVID-19 VACCINE, OR THE PFIZER-BIONTECH COVID-19 VACCINE, BIVALENT AT THE SAME TIME AS OTHER VACCINES?   Data have not yet been submitted to FDA on administration of COMIRNATY (COVID-19 Vaccine, mRNA), the Pfizer-BioNTech COVID-19 Vaccine, or the Pfizer-BioNTech COVID-19 Vaccine, Bivalent at the same time with other vaccines. If you are considering receiving COMIRNATY (COVID-19 Vaccine, mRNA), the Pfizer-BioNTech COVID-19 Vaccine, or the Pfizer-BioNTech COVID-19 Vaccine, Bivalent with other vaccines, discuss your options with your healthcare provider.   .WHAT IF I AM IMMUNOCOMPROMISED?   If you are immunocompromised, you may receive a third primary series dose of Pfizer-BioNTech COVID-19 Vaccine or COMIRNATY (COVID-19 Vaccine, mRNA). Individuals 47 years of age and older may receive a booster dose with Pfizer-BioNTech COVID-19 Vaccine, Bivalent. Vaccinations may not provide full immunity to COVID-19 in people who are  immunocompromised, and you should continue to maintain physical precautions to help prevent COVID-19. Your close contacts should be vaccinated as appropriate.   WHAT IF I AM PREGNANT OR BREASTFEEDING?   If you are pregnant or breastfeeding, discuss your options with your healthcare provider.   WILL THESE VACCINES GIVE ME COVID-19?   No. These vaccines do not contain SARS-CoV-2 and cannot give you COVID-19.   KEEP YOUR VACCINATION CARD   When you get your first COVID-19 vaccine, you will get a vaccination card. Remember to bring your card when you return.         ADDITIONAL INFORMATION   If you have questions, visit the website or call the telephone number provided below.    To access the most recent Fact Sheets, please scan the QR code provided below.      WHERE WILL MY VACCINATION INFORMATION BE RECORDED?   The vaccination provider may include your vaccination information in your state/local   jurisdiction's Immunization Information System (IIS) or other designated system. This   will ensure that you receive the same vaccine when you return for the second dose.  For more information about IISs visit: [email or web address].             HOW CAN I LEARN MORE?   Marland Kitchen Ask the vaccination provider.   . Visit CDC at BeginnerSteps.be.   . Visit FDA at DirectoryExclusive.com.cy  legal-regulatory-and-policy-framework/emergency-use-authorization.   Minette Brine your local or state public health department.   WHERE WILL MY VACCINATION INFORMATION BE RECORDED?   The vaccination provider may include your vaccination information in your state/local jurisdiction's Immunization Information System (IIS) or other designated system. This will ensure that you receive the same vaccine when you return for the second dose of the primary series. For more information about IISs visit:   [email or web address].   CAN I  BE CHARGED AN ADMINISTRATION FEE FOR RECEIPT OF THESE COVID-19 VACCINES?   No. At this time, the provider cannot charge you for a vaccine dose and you cannot be charged an out-of-pocket vaccine administration fee or any other fee if only receiving a COVID-19 vaccination. However, vaccination providers may seek appropriate reimbursement from a program or plan that covers COVID-19 vaccine administration fees for the vaccine recipient (private insurance, Medicare, [place],  [place] [HRSA] COVID-19 Uninsured Program for non-insured recipients).     WHERE CAN I REPORT CASES OF SUSPECTED FRAUD?   Individuals becoming aware of any potential violations of the CDC COVID-19 Vaccination Program requirements are encouraged to report them to the Office of the [employer], U.S. Department of Health and [employer], at 1-800-HHS-TIPS or https://TIPS.HHS.GOV.   WHAT IS THE COUNTERMEASURES INJURY COMPENSATION PROGRAM?   The Countermeasures Injury Compensation Program (CICP) is a federal program that may help pay for costs of medical care and other specific expenses of certain people who have been seriously injured by certain medicines or vaccines, including these vaccines. Generally, a claim must be submitted to the CICP within one (1) year from the date of receiving the vaccine. To learn more about this program, visit https://www.bennett.info/ or call 313-813-4913.   WHAT IS AN EMERGENCY USE AUTHORIZATION (EUA)?   An EUA is a mechanism to facilitate the availability and use of medical products, including vaccines, during public health emergencies, such as the current COVID-19 pandemic. An EUA is supported by a Risk manager (HHS) declaration that circumstances exist to justify the emergency use of drugs and biological products during the COVID-19 pandemic. A product authorized for emergency use has not undergone the same type of review by FDA as an FDA-approved  product.     FDA may issue an EUA when certain criteria are met, which includes that there are no adequate, approved, and available alternatives. In addition, the FDA decision is based on the totality of the scientific evidence available showing that the product may be effective to prevent COVID-19 during the COVID-19 pandemic and that the known and potential benefits of the product outweigh the known and potential risks of the product. All of these criteria must be met to allow for the product to be used during the COVID-19 pandemic.   An EUA is in effect for the duration of the COVID-19 EUA declaration justifying emergency use of this product, unless terminated or revoked (after which the product may no longer be used).    Revised August 31,2022

## 2021-07-15 NOTE — Progress Notes (Signed)
[facility]: Family Medicine Office Visit  Progress Note    Subjective:   [name] is a 65 y.o. female with hypertension.  Hypertension treatment  Toprol-XL 50 mg and hydrochlorothiazide 25 mg  Pertinent history:taking medications as instructed, no medication side effects noted, no TIAs, no chest pain on exertion, no dyspnea on exertion, no swelling of ankles    Lab Results   Component Value Date    SODIUM 132 (L) 07/15/2021    POTASSIUM 3.7 07/15/2021    CHLORIDE 99 07/15/2021    CO2 27 07/15/2021    ANIONGAP 6 07/15/2021    BUN 21 07/15/2021    CREATININE 0.83 07/15/2021    BUNCRRATIO 25 (H) 07/15/2021    GFR 78 07/15/2021    GLUCOSENF 92 07/15/2021    CHOLESTEROL 199 01/20/2021    HDLCHOL 71 01/20/2021    LDLCHOL 109 (H) 01/20/2021    TRIG 95 01/20/2021     Lab Results   Component Value Date    CHOLESTEROL 199 01/20/2021    HDLCHOL 71 01/20/2021    LDLCHOL 109 (H) 01/20/2021    TRIG 95 01/20/2021        Medication list:  acetaminophen (TYLENOL) 500 mg Oral Tablet, Take 650 mg by mouth Every night Taking 2 daily  ascorbic acid, vitamin C, (VITAMIN C) 500 mg Oral Tablet, Take 250 mg by mouth Once a day  aspirin 81 mg Oral Tablet, Chewable, Take 1 Tablet (81 mg total) by mouth Once a day  celecoxib (CELEBREX) 200 mg Oral Capsule, Take 1 Capsule (200 mg total) by mouth Once a day  Cholecalciferol, Vitamin D3, 50 mcg (2,000 unit) Oral Capsule, Take 2,000 Units by mouth Once a day   Fluocinolone-Shower Cap 0.01 % Oil, Apply to scalp 1-2 times weekly at night.  loratadine (CLARITIN) 10 mg Oral Tablet, Take 10 mg by mouth Once a day  metoprolol succinate (TOPROL-XL) 50 mg Oral Tablet Sustained Release 24 hr, Take 1 Tablet (50 mg total) by mouth Once a day  nystatin (NYSTOP) 100,000 unit/gram Powder, by Apply Topically route Twice per day as needed  sennosides-docusate sodium (SENOKOT-S) 8.6-50 mg Oral Tablet, Take 1 Tablet by mouth Twice daily  triamcinolone acetonide (ARISTOCORT A) 0.1 % Cream,  Apply topically Twice daily  warfarin (COUMADIN) 2 mg Oral Tablet, Take 3 Tablets (6 mg total) by mouth Every evening Or as directed by Anticoag clinic  amoxicillin (AMOXIL) 500 mg Oral Capsule, Take 4 Capsules (2,000 mg total) by mouth Once, as needed (Prosthetic heart valve prophylaxis prior to dental, respiratory procedures.) for up to 1 dose (Patient not taking: No sig reported)  cephalexin (KEFLEX) 500 mg Oral Capsule, Take 1 Capsule (500 mg total) by mouth Four times a day (Patient not taking: No sig reported)  clobetasoL (CORMAX) 0.05 % Solution, Apply topically Twice daily for 181 days  clobetasoL (TEMOVATE) 0.05 % Cream, by Apply Topically route Twice daily  hydroCHLOROthiazide (HYDRODIURIL) 25 mg Oral Tablet, Take 1 Tablet (25 mg total) by mouth Once a day  ondansetron (ZOFRAN) 4 mg Oral Tablet, Take 1 Tablet (4 mg total) by mouth Every 6 hours as needed for Nausea/Vomiting  oxyCODONE (ROXICODONE) 5 mg Oral Tablet, Take 1-2 Tablets (5-10 mg total) by mouth Every 4 hours as needed for Pain (Patient not taking: Reported on 07/15/2021)    No facility-administered medications prior to visit.     Review of Systems - as per HPI.   Review of Systems   Constitutional: Negative for fatigue.  Eyes: Negative for visual disturbance.   Respiratory: Negative for cough, chest tightness, shortness of breath and wheezing.    Cardiovascular: Negative for chest pain, palpitations and leg swelling.   Neurological: Negative for dizziness and headaches.        OBJECTIVE:  BP 132/76   Pulse 83   Ht 1.626 m (5\' 4" )   Wt 102 kg (225 lb 8.5 oz)   SpO2 98%   BMI 38.71 kg/m      Body mass index is 38.71 kg/m.  BP Readings from Last 3 Encounters:   07/15/21 132/76   05/27/21 130/78   05/06/21 (!) 142/76     Wt Readings from Last 3 Encounters:   07/15/21 102 kg (225 lb 8.5 oz)   05/27/21 101 kg (222 lb 7.1 oz)   05/06/21 101 kg (223 lb 12.3 oz)     General appearance: in no apparent distress and well developed and well  nourished  General exam BP noted to be well controlled today in office, S1, S2 normal, no gallop, no murmur, chest clear, no JVD, no HSM, no edema.     ASSESSMENT:  PLAN:    ICD-10-CM    1. Essential hypertension  I10 hydroCHLOROthiazide (HYDRODIURIL) 25 mg Oral Tablet     CBC/DIFF     BASIC METABOLIC PANEL   2. Psoriasis of scalp  L40.9 clobetasoL (CORMAX) 0.05 % Solution     DISCONTINUED: clobetasoL (CORMAX) 0.05 % Solution   3. Psoriasis  L40.9 clobetasoL (TEMOVATE) 0.05 % Cream   4. Anemia, unspecified type  D64.9 CBC/DIFF   5. Encounter for screening mammogram for malignant neoplasm of breast  Z12.31 MAMMO BILATERAL SCREENING-ADDL VIEWS/BREAST US AS REQ BY RAD   6. Need for influenza vaccination  Z23 Flu Vaccine, 6 month-adult  Quad 0.5 mL IM (Admin)     1. Hypertension.  Patient is doing well, her blood pressures are well controlled.  No changes today.  Labs as above. -diary viewed.    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  We will check the above labs to make sure she that she recovered from the anemia.    Return in about 7 months (around 02/12/2022) for [facility] wellness.     Derek Jack, APRN,FNP-BC  FAMILY MEDICINE, [facility]  Operated by [facility]  [address]  [place] [address]  Dept: [phone]  Dept Fax: [phone]

## 2021-07-16 ENCOUNTER — Encounter (HOSPITAL_BASED_OUTPATIENT_CLINIC_OR_DEPARTMENT_OTHER): Payer: Medicare Other | Admitting: Family

## 2021-07-17 ENCOUNTER — Ambulatory Visit (HOSPITAL_BASED_OUTPATIENT_CLINIC_OR_DEPARTMENT_OTHER): Payer: Self-pay | Admitting: Family

## 2021-07-17 NOTE — Telephone Encounter (Addendum)
Please advise. Thanks,  Beryl Meager, RN  07/17/2021, 13:34      Regarding: [place] Pt  \\  rx advisement  ----- Message from Estill Bamberg Ever sent at 07/17/2021  1:30 PM EDT -----  Derek Jack, APRN,FNP-BC    Pt's pharmacy calling regarding Pt's script for clobetasoL (CORMAX) 0.05 % Solution.  They state the Pt doesn't want to use this medication any longer and wants to use an alternative.  Please advise as need be.    Thanks so much for all your time and assistance!  Have a GREAT day!!!  --Estill Bamberg

## 2021-07-17 NOTE — Telephone Encounter (Signed)
Left message for patient to return call.  Beryl Meager, RN  07/17/2021, 14:09

## 2021-07-17 NOTE — Telephone Encounter (Signed)
Patient specifically asked for this during her appointment. She cancelled her appt with derm yesterday.   Please ask her what she wants alternative or she should see dermatology.   Thanks  Derek Jack, APRN,FNP-BC

## 2021-07-18 NOTE — Telephone Encounter (Addendum)
Patient called back.   Patient states she was given a printed prescription for Clobetasol at her appointment so she could take it to a Pharmacy and use the [place] app to purchase this. She states [place] informed her that Medicare would not cover this medication. Informed her I will call [facility] back to cancel the prescriptions. She states understanding.  Called [facility] and spoke with managed care department, Sheryl.   Asked them to discontinue any prescription they have for Clobetasol Cream or Solution. It has been discontinued. Invoice # [id number].  Beryl Meager, RN  07/18/2021, 12:21      Regarding: [place] Pt  \\  returning missed call  ----- Message from Estill Bamberg Ever sent at 07/18/2021 11:58 AM EDT -----  Derek Jack, APRN,FNP-BC    Pt returning missed call regarding script for [place].  Please advise.    Thanks so much for all your time and assistance!  Have a GREAT day!!!  --Estill Bamberg

## 2021-07-22 ENCOUNTER — Ambulatory Visit (INDEPENDENT_AMBULATORY_CARE_PROVIDER_SITE_OTHER): Payer: Self-pay | Admitting: Optometrist

## 2021-07-22 ENCOUNTER — Other Ambulatory Visit: Payer: Self-pay

## 2021-07-22 ENCOUNTER — Inpatient Hospital Stay
Admission: RE | Admit: 2021-07-22 | Discharge: 2021-07-22 | Disposition: A | Discharge status: Home / Self Care | Payer: Medicare Other | Source: Ambulatory Visit | Attending: Family | Admitting: Family

## 2021-07-22 DIAGNOSIS — Z1231 Encounter for screening mammogram for malignant neoplasm of breast: Secondary | ICD-10-CM

## 2021-07-24 ENCOUNTER — Ambulatory Visit (HOSPITAL_COMMUNITY): Payer: Self-pay

## 2021-07-24 ENCOUNTER — Encounter (HOSPITAL_COMMUNITY): Payer: Self-pay

## 2021-07-24 LAB — PT/INR: INR: 2

## 2021-07-24 NOTE — Progress Notes (Signed)
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 pt called. )          Plan:  pt to continue current dosing and recheck INR in two weeks. Dosing verified by pt.     Next INR Date: 08/07/21    Leone Brand, BSN RN  Specialty Care Nurse, Anticoagulation  [facility]  Ph. [phone]  (ext. [phone])  Fax [phone]  [email or web address]        For additional information, see dosing calendar below.    Warfarin Therapy Instructions   September 2022 Details    Sun [name] Thu Fri Sat         1               2               3                 4               5               6               7               8               9               10                 11               12               13               14               15               16               17                 18               19               20               21               22    2.1   4 mg   See details      23      4 mg         24      4 mg           25      4 mg         26      6 mg  27      4 mg         28      4 mg         29      6 mg         30      4 mg           Date Details   09/22 Last INR check   INR: 2.1              Warfarin Therapy Instructions   October 2022 Details    Sun Mon Tue Wed Thu Fri Sat           1      4 mg           2      4 mg         3      6 mg         4      4 mg         5      4 mg         6   2.0   6 mg   See details      7      4 mg         8      4 mg           9      4 mg         10      6 mg         11      4 mg         12      4 mg         13      6 mg         14      4 mg         15      4 mg           16      4 mg         17      6 mg          18      4 mg         19      4 mg         20      6 mg         21               22                 23               24               25               26               27               28               29                  30  31                     Date Details   10/06 This INR check   INR: 2.0       Date of next INR:  08/07/2021

## 2021-08-07 ENCOUNTER — Ambulatory Visit (HOSPITAL_COMMUNITY): Payer: Self-pay

## 2021-08-07 ENCOUNTER — Encounter (HOSPITAL_COMMUNITY): Payer: Self-pay

## 2021-08-07 LAB — PT/INR: INR: 1.9

## 2021-08-07 NOTE — Progress Notes (Signed)
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  Left voicemail with dosing, testing and call back number for any questions.    Next INR Date: 11.3.22    Erie Noe, RN SCN  [facility]  Ext. [phone]          For additional information, see dosing calendar below.    Warfarin Therapy Instructions   October 2022 Details    Sun [name] Thu Fri Sat           1                 2               3               4               5               6    2.0   6 mg   See details      7      4 mg         8      4 mg           9      4 mg         10      6 mg         11      4 mg         12      4 mg         13      6 mg         14      4 mg         15      4 mg           16      4 mg         17      6 mg         18      4 mg         19      4 mg         20   1.9   6 mg   See details      21      4 mg         22      4 mg           23      4 mg         24      6 mg         25      4 mg  26      4 mg         27      6 mg         28      4 mg         29      4 mg           30      4 mg         31      6 mg               Date Details   10/06 Last INR check   INR: 2.0      10/20 This INR check   INR: 1.9              Warfarin Therapy Instructions   November 2022 Details    Sun Mon Tue Wed Thu Fri Sat       1      4 mg         2      4 mg         3      6 mg         4               5                 6               7               8               9               10               11               12                 13               14               15               16               17               18                19                 20               21               22               23               24               25               26                 27               28                29  30                   Date Details   No additional details    Date of next INR:  08/21/2021

## 2021-08-11 ENCOUNTER — Other Ambulatory Visit: Payer: Self-pay

## 2021-08-11 ENCOUNTER — Encounter (INDEPENDENT_AMBULATORY_CARE_PROVIDER_SITE_OTHER): Payer: Self-pay | Admitting: Ophthalmology

## 2021-08-11 ENCOUNTER — Ambulatory Visit: Payer: Medicare Other | Attending: Ophthalmology | Admitting: Ophthalmology

## 2021-08-11 DIAGNOSIS — H5213 Myopia, bilateral: Secondary | ICD-10-CM | POA: Insufficient documentation

## 2021-08-11 DIAGNOSIS — H25813 Combined forms of age-related cataract, bilateral: Secondary | ICD-10-CM | POA: Insufficient documentation

## 2021-08-11 DIAGNOSIS — H524 Presbyopia: Secondary | ICD-10-CM | POA: Insufficient documentation

## 2021-08-11 DIAGNOSIS — H02831 Dermatochalasis of right upper eyelid: Secondary | ICD-10-CM | POA: Insufficient documentation

## 2021-08-11 DIAGNOSIS — H02834 Dermatochalasis of left upper eyelid: Secondary | ICD-10-CM | POA: Insufficient documentation

## 2021-08-11 DIAGNOSIS — H52223 Regular astigmatism, bilateral: Secondary | ICD-10-CM | POA: Insufficient documentation

## 2021-08-11 NOTE — Progress Notes (Addendum)
[facility]  [place] [address]  Operated by [facility]         Patient Name: [name]  MRN#: [id number]  [place]: 06-Feb-1956    Date of Service: 08/11/2021    Chief Complaint    Yearly Eye Exam         [name] is a 65 y.o. female who presents today for evaluation/consultation of:  HPI     Yearly Eye Exam     Additional comments: Pt states vision is good. Pt states she occ has little spots of light over the past year. Pt occ uses OTC gtts for allergies. Pt is not photophobic. Pt states she occ sees a shadow usually in dim lights or evening.           Last edited by [name], [place] on 08/11/2021  9:18 AM.        ROS    Positive for: Eyes  Negative for: Constitutional, Gastrointestinal, Neurological, Skin, Genitourinary, Musculoskeletal, HENT, Endocrine, Cardiovascular, Respiratory, Psychiatric, Allergic/Imm, Heme/Lymph  Last edited by [name], [place] on 08/11/2021  9:18 AM.         All other systems Negative    [name], [place]  36/14/4315, 09:19  Base Eye Exam     Visual Acuity (Snellen - Linear)       Right Left    Dist cc 20/25 -1 20/25 -1    Correction: Glasses          Tonometry (Tonopen, 9:31 AM)       Right Left    Pressure 16 15          Pupils       APD    Right None    Left None          Visual Fields (Counting fingers)       Right Left     Full Full          Extraocular Movement       Right Left     Full Full          Neuro/Psych     Oriented x3: Yes    Mood/Affect: Normal          Dilation     Both eyes: 1.0% Mydriacyl, 2.5% Phenylephrine @ 9:31 AM            Slit Lamp and Fundus Exam     Slit Lamp Exam       Right Left    Lids/Lashes Dermatochalasis - upper lid Dermatochalasis - upper lid    Conjunctiva/Sclera Injection Injection    Cornea Clear Clear    Anterior Chamber Deep and quiet Deep and quiet    Iris Round and reactive Round and reactive    Lens Nuclear sclerosis Nuclear sclerosis            Refraction      Wearing Rx       Sphere Cylinder Axis Add    Right -4.25 +1.75 019 +2.50    Left -4.00 +1.25 173 +2.50    Age: 1yrs    Type: PAL          Final Rx       Sphere Cylinder Axis Add    Right -4.25 +1.75 019 +2.50    Left -4.00 +1.25 173 +2.50    Type: PAL                MD  Addition to HPI: Has some trouble seeing when she wakes up at night with her center vision. No hx of eye problems. Sister had RD. HTN, no DM, thyroid issues. 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          No diagnosis found.  No orders of the defined types were placed in this encounter.      Ophthalmic Plan of Care:  [place] [facility]  -not VS  -happy with current MRx  -will monitor      Follow up:    I have asked Lamiyah Schlotter to follow up 1 year, also refer to [facility] for dernmatochalasis            Cannon Kettle, MD  08/11/2021, 09:43      I have seen and examined the above patient. I discussed the above diagnoses listed in the assessment and the above ophthalmic plan of care with the patient and patient's family. All questions were answered. I reviewed and, when necessary, made changes to the technician/resident note, documented ophthalmology exam, chief complaint, history of present illness, allergies, review of systems, past medical, past surgical, family and social history. I personally reviewed and interpreted all testing and/or imaging performed at this visit and agree with the resident's or fellow's interpretation. Any exceptions/additions are edited/noted in the relevant encounter fields.          I reviewed and made necessary changes to the technician, resident or fellow note regarding CC/HPI/ROS/Past Family, Medical and Social Hx/Surg Hx.  Gwendalyn Ege, MD 08/14/2021, 08:24      I saw and examined the patient.  I reviewed the resident's note.  I agree with the findings and plan of care as documented in the resident's note. I personally reviewed and interpreted all testing and/or imaging performed at this visit and agree  with the resident or fellow's interpretation. Any exceptions/additions are edited/noted.    Gwendalyn Ege, MD 08/14/2021, 08:24

## 2021-08-19 ENCOUNTER — Encounter (HOSPITAL_COMMUNITY): Payer: Self-pay

## 2021-08-19 ENCOUNTER — Ambulatory Visit (HOSPITAL_COMMUNITY): Payer: Self-pay

## 2021-08-19 LAB — PT/INR: INR: 1.9

## 2021-08-19 NOTE — Progress Notes (Signed)
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 Continue warfarin dosage of  6mg  Mon/Thurs and 4mg  all other days .  Recheck INR in two weeks. Dosing and next test date confirmed with patient via voice message.    Next INR Date: 09/02/21  [facility]  Speciality Care Nurse, Anticoagulation  [facility] [phone] ext [phone]  Fax [phone]      For additional information, see dosing calendar below.    Warfarin Therapy Instructions   October 2022 Details    Sun Molli Knock Tue Wed Thu Fri Sat           1                 2               3               4               5               6               7               8                 9               10               11               12               13               14               15                 16               17               18               19               20      6  mg   See details      21      4 mg         22      4 mg           23      4 mg         24      6 mg         25      4 mg  26      4 mg         27      6 mg         28      4 mg         29      4 mg           30      4 mg         31      6 mg               Date Details   10/20 Last INR check              Warfarin Therapy Instructions   November 2022 Details    Sun Mon Tue Wed Thu Fri Sat       1   1.9   4 mg   See details      2      4 mg         3      6 mg         4      4 mg         5      4 mg           6      4 mg         7      6 mg         8      4 mg         9      4 mg         10      6 mg         11      4 mg          12      4 mg           13      4 mg         14      6 mg         15      4 mg         16               17               18               19                 20               21               22               23               24               25               26                 27               28                29  30                   Date Details   11/01 This INR check   INR: 1.9       Date of next INR:  09/02/2021

## 2021-08-29 ENCOUNTER — Encounter (INDEPENDENT_AMBULATORY_CARE_PROVIDER_SITE_OTHER): Payer: Self-pay | Admitting: Ophthalmology

## 2021-09-02 ENCOUNTER — Encounter (HOSPITAL_BASED_OUTPATIENT_CLINIC_OR_DEPARTMENT_OTHER): Payer: Self-pay

## 2021-09-02 ENCOUNTER — Encounter (HOSPITAL_COMMUNITY): Payer: Self-pay

## 2021-09-02 ENCOUNTER — Ambulatory Visit (HOSPITAL_COMMUNITY): Payer: Self-pay

## 2021-09-02 ENCOUNTER — Inpatient Hospital Stay
Admission: RE | Admit: 2021-09-02 | Discharge: 2021-09-02 | Disposition: A | Discharge status: Home / Self Care | Payer: Medicare Other | Source: Ambulatory Visit | Attending: Family | Admitting: Family

## 2021-09-02 ENCOUNTER — Other Ambulatory Visit: Payer: Self-pay

## 2021-09-02 DIAGNOSIS — Z1231 Encounter for screening mammogram for malignant neoplasm of breast: Secondary | ICD-10-CM | POA: Insufficient documentation

## 2021-09-02 LAB — PT/INR: INR: 1.7

## 2021-09-03 NOTE — Progress Notes (Signed)
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 Continue warfarin dosage of 6mg  on Mon/Thur and 4mg   all other days.  Recheck INR in 2 weeks. Dosing and next test date confirmed with patient via mychart.    Next INR Date: 09/16/21  [facility]  Speciality Care Nurse, Anticoagulation  [facility] [phone] ext [phone]  Fax [phone]               For additional information, see dosing calendar below.    Warfarin Therapy Instructions   November 2022 Details    Sun [name] Thu Fri Sat       1      4 mg   See details      2      4 mg         3      6 mg         4      4 mg         5      4 mg           6      4 mg         7      6 mg         8      4 mg         9      4 mg         10      6 mg         11      4 mg         12      4 mg           13      4 mg         14      6 mg         15   1.7   4 mg   See details      16      4 mg         17      6 mg         18      4 mg         19      4 mg           20      4 mg         21      6 mg         22      4 mg         23      4 mg         24      6 mg  25      4 mg         26      4 mg           27      4 mg         28      6 mg         29      4 mg         30                   Date Details   11/01 Last INR check      11/15 This INR check   INR: 1.7       Date of next INR:  09/16/2021

## 2021-09-16 ENCOUNTER — Encounter (HOSPITAL_COMMUNITY): Payer: Self-pay

## 2021-09-16 ENCOUNTER — Ambulatory Visit (HOSPITAL_COMMUNITY): Payer: Self-pay

## 2021-09-16 LAB — PT/INR: INR: 2

## 2021-09-16 NOTE — Progress Notes (Signed)
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  Confirmed dosing and testing with patient.     Next INR Date: 12.13.22    Erie Noe, RN SCN  [facility]  Ext. [phone]        For additional information, see dosing calendar below.    Warfarin Therapy Instructions   November 2022 Details    Sun Molli Knock Tue Wed Thu Fri Sat       1               2               3               4               5                 6               7               8               9               10               11               12                 13               14               15    1.7   4 mg   See details      16      4 mg         17      6 mg         18      4 mg         19      4 mg           20      4 mg         21      6 mg         22      4 mg         23      4 mg         24      6 mg         25      4 mg         26      4  mg           27      4 mg         28      6 mg         29   2.0   4 mg   See details      30      4 mg             Date Details   11/15 Last INR check   INR: 1.7      11/29 This INR check   INR: 2.0              Warfarin Therapy Instructions   December 2022 Details    Sun Mon Tue Wed Thu Fri Sat         1      6 mg         2      4 mg         3      4 mg           4      4 mg         5      6 mg         6      4 mg         7      4 mg         8      6 mg         9      4 mg         10      4 mg           11      4 mg         12      6 mg         13      4 mg         14               15               16               17                  18               19               20               21               22               23               24                 25               26               27               28                29  30               31                Date Details   No additional details    Date of next INR:  09/30/2021

## 2021-09-19 ENCOUNTER — Inpatient Hospital Stay (HOSPITAL_COMMUNITY)
Admission: RE | Admit: 2021-09-19 | Discharge: 2021-09-19 | Disposition: A | Discharge status: Home / Self Care | Payer: Medicare Other | Source: Ambulatory Visit

## 2021-09-19 ENCOUNTER — Ambulatory Visit: Payer: Medicare Other | Attending: Ophthalmology | Admitting: Ophthalmology

## 2021-09-19 ENCOUNTER — Encounter (INDEPENDENT_AMBULATORY_CARE_PROVIDER_SITE_OTHER): Payer: Self-pay | Admitting: Ophthalmology

## 2021-09-19 ENCOUNTER — Other Ambulatory Visit: Payer: Self-pay

## 2021-09-19 ENCOUNTER — Ambulatory Visit (HOSPITAL_BASED_OUTPATIENT_CLINIC_OR_DEPARTMENT_OTHER)
Admission: RE | Admit: 2021-09-19 | Discharge: 2021-09-19 | Disposition: A | Discharge status: Home / Self Care | Payer: Medicare Other | Source: Ambulatory Visit

## 2021-09-19 DIAGNOSIS — H02831 Dermatochalasis of right upper eyelid: Secondary | ICD-10-CM

## 2021-09-19 DIAGNOSIS — H25813 Combined forms of age-related cataract, bilateral: Secondary | ICD-10-CM | POA: Insufficient documentation

## 2021-09-19 DIAGNOSIS — H02834 Dermatochalasis of left upper eyelid: Secondary | ICD-10-CM | POA: Insufficient documentation

## 2021-09-19 DIAGNOSIS — H5340 Unspecified visual field defects: Secondary | ICD-10-CM | POA: Insufficient documentation

## 2021-09-19 NOTE — Progress Notes (Addendum)
[facility]  [place] [address]  Operated by [facility] Note    09/19/2021    Patient Name: [name]    Date of Birth:  [birth year]    Referring Provider:  Gwendalyn Ege, MD  [place]  [place],  [place] [address]    Ophthalmologist/Optometrist:      Chief Complaint    New Patient         HPI    NPV for Dermatochalasis of both upper eyelids  Referred by Dr Laurance Flatten  Pt states that she has a rash LUL   Pt interested in hearing about eyelid sx but not sure, HX of heart valve sx and on blood thinners  Pt VA stable no concerns  Denies tearing and discharge, pain  Pt notes occ itchy ou  Visine occ ou    Last edited by Matilde Sprang, [place] on 09/19/2021  9:41 AM.          ROS    Positive for: Eyes (Dermatochalasis of both upper eyelids)  Negative for: Constitutional, Gastrointestinal, Neurological, Skin, Genitourinary, Musculoskeletal, HENT, Endocrine, Cardiovascular, Respiratory, Psychiatric, Allergic/Imm, Heme/Lymph  Last edited by Matilde Sprang, [place] on 09/19/2021  9:41 AM.          History of Sun Exposure:no If yes:   Excessive sun burn:   History radiation to head:   History of skin cancer:        Past Ocular history:   Past Surgical History:   Procedure Laterality Date   . ARTHROPLASTY HIP ANTERIOR TOTAL MAKO ROBOTIC ASSISTED Right 03/13/2021    Performed by [name], MD at [facility]   . ARTHROPLASTY HIP TOTAL ANTERIOR Left 08/13/2016    Performed by [name], MD at [place]   . ASCENDING AORTOGRAM N/A 03/26/2016    Performed by Marinda Elk, MD at [facility]   . AUTOLOGOUS BLOOD CONSERVATION SETUP N/A 01/04/2019    Performed by [name], MD at [facility] [place]   . AUTOLOGOUS BLOOD CONSERVATION, MONITORING PER HOUR N/A 01/04/2019    Performed by [name], MD at [place]   . COLONOSCOPY N/A 05/14/2020     Performed by Sharyn Creamer., MD at [place]   . CORONARY ANGIOGRAPHY N/A 12/06/2018    Performed by [name], MD at [facility] [place]   . HX AORTIC VALVE REPLACEMENT     . HX CESAREAN SECTION  1984   . HX HEART CATHETERIZATION      x2 with no intervention per pt report   . HX HERNIA REPAIR  2004, 2006, 2008    x3   . HX HIP REPLACEMENT Left 08/11/2016   . HX HYSTERECTOMY  2003   . HX OOPHORECTOMY      1 ovary   . Mini sternotomy, aortic valve replacement with an undetermined valve type N/A 01/04/2019    Performed by [name], MD at [place]   . PERFUSION CHARGE/STBY/CARDIOPULMONARY BYPASS N/A 01/04/2019    Performed by [name], MD at [facility] [place]   . R/L Heart Cath W/Coronary Angio W/Wo Lvg  03/26/2016    Performed by Marinda Elk, MD at [place], [place] 09/19/2021, 10:21  MD Addition to HPI:    The patient is a 65 y.o. female here with complaint of:    NPV, referred by Dr. Laurance Flatten for dermatochalasis eval OU. Pt states Dr. Laurance Flatten believes eyelid sx would improve her VA. Endorses rash LUL and occ itchiness OU. Denies new FOL/floaters, diplopia, HA, and pain. Pt is using Visine occ OU. Pt wears glasses and  Has previously used CTL. Pt would like to discuss sx as an option, but notes she is concerned about her hx of heart valve sx and current use of blood thinners.     Ptosis evaluation: Droopy eyelids cause problems when trying to drive  Vision is improved by   Problems include: itching  Uses eye drops or ointment: yes      Assessment     Impression(s):      ICD-10-CM    1. Dermatochalasis of both upper eyelids  H02.831 OPH EXTERNAL PHOTOS    H02.834 OPH 1 ISOPTER VF      2. Visual field defect  H53.40       3. Combined form of age-related cataract, both eyes  H25.813           Plan(s)/Recommendation(s):    Dermatochalasis OU - brows in good positions with impact on ADL   - hx of heart valve sx  - hx SCC removal with  Mohs' Reconstruction (2020)  - PF done in clinic today  - photo  - disc r/b/a BUL bleph - pt defers at this time    [facility] OU  - not visually significant  - cont to monitor     Imaging (CT/MRI) Reviewed:   Results Reviewed:       I personally performed the services described in this documentation, as scribed  in my presence, and it is both accurate  and complete.  Veto Kemps, MD 09/19/2021  10:21    I am scribing for, and in the presence of Dr. Alfonse Spruce for services provided on 09/19/2021.  Hyman Bower, [place], [place]  09/19/2021, 09:58    I saw and examined the patient.  I reviewed the technician/resident's note.  I agree with the findings and plan of care as documented in the resident's note.  Any exceptions/additions are edited/noted.    Veto Kemps, MD 09/19/2021  10:21      Eye Examination:     Neuro/Psych     Oriented x3: Yes    Mood/Affect: Normal        Visual Acuity     Visual Acuity (Snellen - Linear)       Right Left    Dist cc 20/25 -1 20/20 -1    Correction: Glasses          Edited by: Matilde Sprang, COA            Wearing Rx     Wearing Rx       Sphere Cylinder Axis Add    Right -4.25 +1.75 019 +2.50    Left -4.00 +1.25 173 +2.50    Type: PAL          Edited by: Matilde Sprang, COA            Not recorded       Not recorded       Confrontational Visual Fields     Visual Fields (Counting fingers)       Right Left  Edited by: Matilde Sprang, COA            Pupils       Pupils APD    Right PERRL None    Left PERRL None        Extraocular Movement     Extraocular Movement       Right Left     Full, Ortho Full, Ortho          Edited by: Matilde Sprang, COA                               Sensation   V1: Intact bilaterally   V2: Intact bilaterally   V3: Intact bilaterally    Facial Contours:     Hertel:                            OD:                            OS:                            Base:    Eyelids:    Bell's:      Lid edema SW:FUXN OS: None Conj chem OD:  None AT:FTDD   Lid inject OD: None OS: None Conj inject OD: None OS: None       6   1   PF   MRD       6   1       15       LF   LC       15                USS   LSS                    LAG            Main Ophthalmology Exam     External Exam       Right Left    External Normal Normal          Slit Lamp Exam       Right Left    Lids/Lashes Dermatochalasis - upper lid Dermatochalasis - upper lid    Conjunctiva/Sclera Injection Injection    Cornea Clear Clear    Anterior Chamber Deep and quiet Deep and quiet    Iris Round and reactive Round and reactive    Lens Nuclear sclerosis Nuclear sclerosis                IOP straight Tonometry     Tonometry (Tonopen, 10:03 AM)       Right Left    Pressure 10 11          Edited by: Matilde Sprang, COA                Upgaze     5 down      Past Medical / Surgical / Family / Social History:      has a past medical history of Abdominal hernia (04/29/2020), Anemia, Aortic stenosis, Arthritis, Atrial fibrillation (CMS HCC) (04/29/2020), Back problem (04/29/2020), Beta blocker prescribed for left ventricular systolic dysfunction, Blood in stool, Blood thinned due to long-term anticoagulant use (04/29/2020), Cancer (CMS HCC), Chronic pain (04/29/2020), Colon polyp, Dysrhythmias, Heart  murmur (01/04/2019), High blood pressure, Motion sickness, Obesity, Rash (04/29/2020), Valvular disease, and Wears glasses.    She has no past medical history of Angina pectoris (CMS HCC), Anxiety, Asthma, Breast CA (CMS HCC), Cataract, Cervical cancer (CMS HCC), Chronic obstructive airway disease (CMS HCC), Claustrophobia, Clotting disorder (CMS HCC), Colon cancer (CMS [place]), Convulsions (CMS HCC), Coronary artery disease, Cough, CVA (cerebrovascular accident) (CMS [place]), Deep vein thrombosis (DVT) (CMS HCC), Depression, Difficult intubation, Difficulty waking, Disorder of liver, Dyspnea on exertion, Endometrial cancer (CMS HCC), Esophageal reflux, Glaucoma, H/O hearing loss, H/O urinary tract infection,  Hard to intubate, Headache, Heartburn, Hepatitis A virus infection, HH (hiatus hernia), History of kidney disease, coronary artery bypass graft, transfusion, Hyperlipidemia, Hyperthyroidism, Hypothyroid, Jaundice, Kidney stones, Malignant hyperthermia, MI (myocardial infarction) (CMS HCC), Migraine, Neck problem, Orthopnea, Ovarian cancer (CMS HCC), Palpitations, Panic attack, Peripheral edema, Personal history of venous thrombosis and embolism, Pneumonia, PONV (postoperative nausea and vomiting), Problems with swallowing, Pseudocholinesterase deficiency, Pulmonary embolism (CMS HCC), Rheumatic fever, Shortness of breath, Sleep apnea, Speech and language deficits, Thyroid disorder, TIA (transient ischemic attack), Treatment, Tuberculosis colliquativa, Type 2 diabetes mellitus (CMS [place]), Type I diabetes mellitus (CMS [place]), Unintentional weight loss, Upper respiratory infection, Uterine cancer (CMS [place]), Viral hepatitis B, or Viral hepatitis C.    Past Surgical History:   Procedure Laterality Date   . HX AORTIC VALVE REPLACEMENT     . HX CESAREAN SECTION  1984   . HX HEART CATHETERIZATION      x2 with no intervention per pt report   . HX HERNIA REPAIR  2004, 2006, 2008    x3   . HX HIP REPLACEMENT Left 08/11/2016   . HX HYSTERECTOMY  2003   . HX OOPHORECTOMY      1 ovary           Family Medical History:     Problem Relation (Age of Onset)    Atrial fibrillation Sister    Heart Disease Mother    Hypertension (High Blood Pressure) Father    Lung Cancer Father, Brother    Macular Degen Maternal Aunt    Mitral valve prolapse Sister    Retinal Detachment Sister    Sudden Death no cause Mother            Social History     Tobacco Use   . Smoking status: Former     Packs/day: 1.00     Years: 42.00     Pack years: 42.00     Types: Cigarettes     Start date: 22     Quit date: 2014     Years since quitting: 8.9   . Smokeless tobacco: Never   Vaping Use   . Vaping Use: Never used   Substance Use Topics   . Alcohol use: Yes      Comment: social only   . Drug use: Never       I have seen and examined the above patient. I discussed the above diagnoses listed in the assessment and the above ophthalmic plan of care with the patient and patient's family. All questions were answered. I reviewed and, when necessary, made changes to the technician/resident note, documented ophthalmology exam, chief complaint, history of present illness, allergies, review of systems, past medical, past surgical, family and social history.    Veto Kemps, MD 09/19/2021  10:21

## 2021-09-30 ENCOUNTER — Ambulatory Visit (HOSPITAL_COMMUNITY): Payer: Self-pay

## 2021-09-30 ENCOUNTER — Encounter (HOSPITAL_COMMUNITY): Payer: Self-pay

## 2021-09-30 LAB — PT/INR: INR: 2

## 2021-10-01 NOTE — Progress Notes (Signed)
Anticoagulation Encounter Note:    Surgical AVR plan   Warfarin start date: 01-06-2019   [facility] Staff: Earlie Raveling   DX : On-X AVR   INR Range: 1.5 - 2.0  Tab strength: warfarin 2mg    Contact 1: [phone] cell   Contact 2:   Lab: POC/ - [facility]   Comments: Tylenol regularly    INR: 2.0  Current Dose: 6 mg M/TH, 4 mg all other days  Clinical Outcomes       Positives:  INR In Range            Plan: 6 mg M/TH, 4 mg all other days; recheck in two weeks. Confirmed dosing and testing with patient via MyChart message.       Next INR Date: 12.27.22    Erie Noe, RN SCN  [facility] [facility]  Ext. [phone]        For additional information, see dosing calendar below.    Warfarin Therapy Instructions   November 2022 Details    Sun [name] Thu Fri Sat       1               2               3               4               5                 6               7               8               9               10               11               12                 13               14               15               16               17               18               19                 20               21               22               23               24               25               26                 27                28  29   2.0   4 mg   See details      30      4 mg             Date Details   11/29 Last INR check     INR: 2.0              Warfarin Therapy Instructions   December 2022 Details    Sun [name] Thu Fri Sat         1      6 mg         2      4 mg         3      4 mg           4      4 mg         5      6 mg         6      4 mg         7      4 mg         8      6 mg         9      4 mg         10      4 mg           11      4 mg         12      6 mg         13   2.0   4 mg   See details      14      4 mg         15      6 mg         16      4 mg         17      4 mg           18      4 mg         19      6 mg         20      4 mg         21      4 mg         22      6 mg         23      4 mg         24       4 mg           25      4 mg         26      6 mg         27      4 mg         28               29               30               31                 Date Details   12/13 This INR  check     INR: 2.0       Date of next INR:  10/14/2021

## 2021-10-03 ENCOUNTER — Ambulatory Visit (INDEPENDENT_AMBULATORY_CARE_PROVIDER_SITE_OTHER): Payer: Self-pay | Admitting: Ophthalmology

## 2021-10-14 ENCOUNTER — Encounter (HOSPITAL_COMMUNITY): Payer: Self-pay

## 2021-10-14 ENCOUNTER — Ambulatory Visit (HOSPITAL_COMMUNITY): Payer: Self-pay

## 2021-10-14 LAB — PT/INR: INR: 2

## 2021-10-14 NOTE — Progress Notes (Signed)
Anticoagulation Encounter Note:    Surgical AVR plan   Warfarin start date: 01-06-2019   [facility] Staff: Earlie Raveling   DX : On-X AVR   INR Range: 1.5 - 2.0  Tab strength: warfarin 2mg    Contact 1: [phone] cell   Contact 2:   Lab: POC/[place] - [facility]   Comments: Tylenol regularly    INR: 2.0  Current Dose: 6 mg MTh, 4 mg all other days  Clinical Outcomes     Positives:  INR In Range          Plan: 6 mg MTh, 4 mg all other days; recheck in 2 weeks.  Confirmed dosing and testing with patient via MyChart message.    Next INR Date: 1.10.23    Erie Noe, RN SCN  [place] [facility]  Ext. [phone]          For additional information, see dosing calendar below.    Warfarin Therapy Instructions   December 2022 Details    Sun [name] Thu Fri Sat         1               2               3                 4               5               6               7               8               9               10                 11               12               13    2.0   4 mg   See details      14      4 mg         15      6 mg         16      4 mg         17      4 mg           18      4 mg         19      6 mg         20      4 mg         21      4 mg         22      6 mg         23      4 mg         24      4 mg           25      4 mg         26      6 mg         27  2.0   4 mg   See details      28      4 mg         29      6 mg         30      4 mg         31      4 mg          Date Details   12/13 Last INR check   INR: 2.0      12/27 This INR check   INR: 2.0              Warfarin Therapy Instructions   January 2023 Details    Sun Mon Tue Wed Thu Fri Sat     1      4 mg         2      6 mg         3      4 mg         4      4 mg         5      6 mg         6      4 mg         7      4 mg           8      4 mg         9      6 mg         10      4 mg         11               12               13               14                 15               16               17               18               19               20               21                  22               23               24               25               26               27               28                 29                30  31                    Date Details   No additional details    Date of next INR:  10/28/2021

## 2021-10-28 ENCOUNTER — Encounter (HOSPITAL_COMMUNITY): Payer: Self-pay

## 2021-10-28 ENCOUNTER — Ambulatory Visit (HOSPITAL_COMMUNITY): Payer: Self-pay

## 2021-10-28 LAB — PT/INR: INR: 1.8

## 2021-10-29 NOTE — Progress Notes (Signed)
Anticoagulation Encounter Note:    Surgical AVR plan   Warfarin start date: 01-06-2019   [facility] Staff: [name]   DX : On-X AVR   INR Range: 1.5 - 2.0  Tab strength: warfarin 2mg    Contact 1: [phone] cell   Contact 2:   Lab: POC/[place] - [facility]   Comments: Tylenol regularly    INR: 1.8   Current Dose: 6 mg M/Th, 4 mg all other days  Clinical Outcomes     Positives:  INR In Range          Plan: 6 mg M/Th, 4 mg all other days; recheck in 2 weeks. Confirmed dosing and testing with patient via MyChart message.    Next INR Date: 1.25.23    Erie Noe, RN SCN  [place] [facility]  Ext. [phone]          For additional information, see dosing calendar below.    Warfarin Therapy Instructions   December 2022 Details    Sun [name] Thu Fri Sat         1               2               3                 4               5               6               7               8               9               10                 11               12               13               14               15               16               17                 18               19               20               21               22               23               24                 25               26               27    2.0   4 mg   See details      28  4 mg         29      6 mg         30      4 mg         31      4 mg          Date Details   12/27 Last INR check   INR: 2.0              Warfarin Therapy Instructions   January 2023 Details    Sun Mon Tue Wed Thu Fri Sat     1      4 mg         2      6 mg         3      4 mg         4      4 mg         5      6 mg         6      4 mg         7      4 mg           8      4 mg         9      6 mg         10   1.8   4 mg   See details      11      4 mg         12      6 mg         13      4 mg         14      4 mg           15      4 mg         16      6 mg         17      4 mg         18      4 mg         19      6 mg         20      4 mg         21      4 mg           22      4 mg         23      6 mg          24      4 mg         25               26               27               28                 29               30                31  Date Details   01/10 This INR check   INR: 1.8       Date of next INR:  11/11/2021

## 2021-11-11 ENCOUNTER — Encounter (HOSPITAL_COMMUNITY): Payer: Self-pay

## 2021-11-11 ENCOUNTER — Ambulatory Visit (HOSPITAL_COMMUNITY): Payer: Self-pay

## 2021-11-11 LAB — PT/INR: INR: 1.9

## 2021-11-12 NOTE — Progress Notes (Signed)
Anticoagulation Encounter Note:    Surgical AVR plan   Warfarin start date: 01-06-2019   [facility] Staff: Earlie Raveling   DX : On-X AVR   INR Range: 1.5 - 2.0  Tab strength: warfarin 2mg    Contact 1: [phone] cell   Contact 2:   Lab: POC/[place] - [facility]   Comments: Tylenol regularly    [facility]VI anticoag SCN: Tammi Sou RN ext [phone]    INR: 1.9  Current Dose: 6mg  M/Th and 4mg  all other days  Clinical Outcomes       Positives:  INR In Range          Patient Findings       Negatives:  Change in health, Change in medications, Change in diet/appetite          Plan: 6mg  M/Th and 4mg  all other days. recheck in 2 weeks.Confirmed dosing and testing with patient via MyChart message.   Next INR Date: 2.7.23    Erie Noe, RN SCN  [facility]  Ext. [phone]         For additional information, see dosing calendar below.    Warfarin Therapy Instructions   January 2023 Details    Sun Molli Knock Tue Wed Thu Fri Sat     1               2               3               4               5               6               7                 8               9               10    1.8   4 mg   See details      11      4 mg         12      6 mg         13      4 mg         14      4 mg           15      4 mg         16      6 mg         17      4 mg         18      4 mg         19      6 mg         20      4 mg         21      4 mg           22      4 mg         23      6 mg         24   1.9   4 mg   See details      25  4 mg         26      6 mg         27      4 mg         28      4 mg           29      4 mg         30      6 mg         31      4 mg              Date Details   01/10 Last INR check     INR: 1.8      01/24 This INR check     INR: 1.9              Warfarin Therapy Instructions   February 2023 Details    Sun Mon Tue Wed Thu Fri Sat        1      4 mg         2      6 mg         3      4 mg         4      4 mg           5      4 mg         6      6 mg         7      4 mg         8               9               10               11                  12               13               14               15               16               17               18                 19               20               21               22               23               24               25                 26                27  28                    Date Details   No additional details    Date of next INR:  11/25/2021

## 2021-11-25 ENCOUNTER — Ambulatory Visit (HOSPITAL_COMMUNITY): Payer: Self-pay

## 2021-11-25 ENCOUNTER — Encounter (INDEPENDENT_AMBULATORY_CARE_PROVIDER_SITE_OTHER): Payer: Self-pay

## 2021-11-25 ENCOUNTER — Encounter (HOSPITAL_COMMUNITY): Payer: Self-pay

## 2021-11-25 LAB — PT/INR: INR: 1.5

## 2021-11-25 NOTE — Progress Notes (Addendum)
Anticoagulation Encounter Note:    Surgical AVR plan   Warfarin start date: 01-06-2019   [facility] Staff: Raina   DX : On-X AVR   INR Range: 1.5 - 2.0  Tab strength: warfarin 2mg    Contact 1: [phone] cell   Contact 2:   Lab: POC/[place] - [facility]   Comments: Tylenol regularly    [facility] anticoag SCN: Tammi Sou RN ext [phone]    INR: 1.5  Current Dose: 6 mg MTh, 4 mg all other days  Clinical Outcomes       Positives:  INR In Range          Patient Findings       Positives:  Change in health (woke up with swollen right eye and headache. Taking benadryl PRN)          Plan: 6 mg MTh, 4 mg all other days; recheck in 2 weeks.  Confirmed dosing and testing with patient via MyChart message.    Next INR Date: 2.14.23    Erie Noe, RN SCN  [facility]  Ext. [phone]           For additional information, see dosing calendar below.    Warfarin Therapy Instructions   January 2023 Details    Sun Molli Knock Tue Wed Thu Fri Sat     1               2               3               4               5               6               7                 8               9               10               11               12               13               14                 15               16               17               18               19               20               21                 22               23               24    1.9   4 mg   See details      25  4 mg         26      6 mg         27      4 mg         28      4 mg           29      4 mg         30      6 mg         31      4 mg              Date Details   01/24 Last INR check     INR: 1.9              Warfarin Therapy Instructions   February 2023 Details    Sun Mon Tue Wed Thu Fri Sat        1      4 mg         2      6 mg         3      4 mg         4      4 mg           5      4 mg         6      6 mg         7   1.5   4 mg   See details      8      4 mg         9      6 mg         10      4 mg         11      4 mg           12      4 mg         13      6 mg         14      4 mg          15      4 mg         16      6 mg         17      4 mg         18      4 mg           19      4 mg         20      6 mg         21      4 mg         22               23               24               25                 26                27  28                    Date Details   02/07 This INR check     INR: 1.5       Date of next INR:  12/09/2021

## 2021-12-02 ENCOUNTER — Ambulatory Visit (HOSPITAL_COMMUNITY): Payer: Self-pay

## 2021-12-02 ENCOUNTER — Encounter (HOSPITAL_COMMUNITY): Payer: Self-pay

## 2021-12-02 LAB — PT/INR: INR: 1.9

## 2021-12-02 NOTE — Progress Notes (Signed)
Anticoagulation Encounter Note:    Surgical AVR plan   Warfarin start date: 01-06-2019   [facility] Staff: Anna Beaird   DX : On-X AVR   INR Range: 1.5 - 2.0  Tab strength: warfarin 2mg    Contact 1: [phone] cell   Contact 2:   Lab: POC/[place] - [facility]   Comments: Tylenol regularly    [facility] anticoag SCN: Tammi Sou RN ext [phone]    INR: 1.9  Current Dose: 6mg  Mon/thur, 4mg  all other days  Clinical Outcomes       Positives:  INR In Range          Patient Findings       Negatives:  Change in health, Change in medications (pt took more Tylenol last week), Change in diet/appetite (pt did not eat as much broccoli last week.)          Plan: 6mg  Mon/thur, 4mg  all other days.  Recheck in two weeks.  Confirmed dosing and testing with patient.     Next INR Date: 2.28.23    Erie Noe, RN SCN  [facility]  Ext. [phone]              For additional information, see dosing calendar below.    Warfarin Therapy Instructions   February 2023 Details    Sun Molli Knock Tue Wed Thu Fri Sat        1               2               3               4                 5               6               7    1.5   4 mg   See details      8      4 mg         9      6 mg         10      4 mg         11      4 mg           12      4 mg         13      6 mg         14   1.9   4 mg   See details      15      4 mg         16      6 mg         17      4 mg         18      4 mg           19      4 mg         20      6 mg         21      4 mg         22      4 mg         23      6 mg  24      4 mg         25      4 mg           26      4 mg         27      6 mg         28      4 mg              Date Details   02/07 Last INR check     INR: 1.5      02/14 This INR check     INR: 1.9       Date of next INR:  12/16/2021

## 2021-12-16 ENCOUNTER — Encounter (HOSPITAL_COMMUNITY): Payer: Self-pay

## 2021-12-16 ENCOUNTER — Ambulatory Visit (HOSPITAL_COMMUNITY): Payer: Self-pay

## 2021-12-16 LAB — PT/INR: INR: 2.2

## 2021-12-17 NOTE — Progress Notes (Signed)
Anticoagulation Encounter Note:    Surgical AVR plan   Warfarin start date: 01-06-2019   [facility] Staff: Raina   DX : On-X AVR   INR Range: 1.5 - 2.0  Tab strength: warfarin 2mg    Contact 1: [phone] cell   Contact 2:   Lab: POC/[place] - [facility]   Comments: Tylenol regularly    [facility] anticoag SCN: Tammi Sou RN ext [phone]    INR: 2.2  Current Dose: 6mg  Mon/Thur, 4mg  all other days  Clinical Outcomes     Positives:  INR Above Goal (slightly)          Plan: Continue warfarin 6mg  Mon/Thur, 4mg  all other days.  Recheck INR in two weeks.  Confirmed dosing and testing with patient via [place].       Next INR Date: 12/30/21    Tery Sanfilippo RN BSN  Specialty Care Nurse, Anticoagulation  [facility]  [phone] ext [phone]         For additional information, see dosing calendar below.    Warfarin Therapy Instructions   February 2023 Details    Sun [name] Thu Fri Sat        1               2               3               4                 5               6               7               8               9               10               11                 12               13               14    1.9   4 mg   See details      15      4 mg         16      6 mg         17      4 mg         18      4 mg           19      4 mg         20      6 mg         21      4 mg         22      4 mg         23      6 mg         24      4 mg         25      4 mg  26      4 mg         27      6 mg         28   2.2   4 mg   See details           Date Details   02/14 Last INR check   INR: 1.9      02/28 This INR check   INR: 2.2              Warfarin Therapy Instructions   March 2023 Details    Sun Mon Tue Wed Thu Fri Sat        1      4 mg         2      6 mg         3      4 mg         4      4 mg           5      4 mg         6      6 mg         7      4 mg         8      4 mg         9      6 mg         10      4 mg         11      4 mg           12      4 mg         13      6 mg         14      4 mg         15                16               17               18                 19               20               21               22               23               24               25                 26               27               28               29               30                31  Date Details   No additional details    Date of next INR:  12/30/2021

## 2021-12-30 ENCOUNTER — Encounter (HOSPITAL_COMMUNITY): Payer: Self-pay

## 2021-12-30 ENCOUNTER — Ambulatory Visit (HOSPITAL_COMMUNITY): Payer: Self-pay

## 2021-12-30 LAB — PT/INR: INR: 2

## 2021-12-30 NOTE — Progress Notes (Signed)
Anticoagulation Encounter Note:    Surgical AVR plan   Warfarin start date: 01-06-2019   [facility] Staff: Raina   DX : On-X AVR   INR Range: 1.5 - 2.0  Tab strength: warfarin '2mg'$    Contact 1: [phone] cell   Contact 2:   Lab: POC/[place] - [facility]   Comments: Tylenol regularly    [facility] anticoag SCN: Tammi Sou RN ext [phone]    INR: 2.0  Current Dose: '6mg'$  Mon/thur, '4mg'$  all other days  Clinical Outcomes     Negatives:  INR In Range        Patient Findings     Negatives:  Change in health, Change in medications, Change in diet/appetite        Plan: continue warfarin '6mg'$  Mon/thur, '4mg'$  all other days.  Recheck INR in two weeks.  Confirmed dosing and testing with patient.    Next INR Date: 3.28.23    [place], Anticoagulation  [facility]  [place]        For additional information, see dosing calendar below.    Warfarin Therapy Instructions   February 2023 Details    Sun Molli Knock Tue Wed Thu Fri Sat        '1               2               3               4                 5               6               7               8               9               10               11                 12               13               14               15               16               17               18                 19               20               21               22               23               24               '$ 25  $'26               27               28   'g$ 2.2   4 mg   See details           Date Details   02/28 Last INR check   INR: 2.2              Warfarin Therapy Instructions   March 2023 Details    Sun Mon Tue Wed Thu Fri Sat        1      4 mg         2      6 mg         3      4 mg         4      4 mg           5      4 mg         6      6 mg         7      4 mg         8      4 mg         9      6 mg         10      4 mg         11      4 mg           12      4 mg         13      6 mg         14   2.0   4 mg   See details      15      4 mg         16       6 mg         17      4 mg         18      4 mg           19      4 mg         20      6 mg         21      4 mg         22      4 mg         23      6 mg         24      4 mg         25      4 mg           26      4 mg         27      6 mg         28      4 mg         29               30               31  Date Details   03/14 This INR check   INR: 2.0       Date of next INR:  01/13/2022

## 2022-01-13 ENCOUNTER — Encounter (HOSPITAL_COMMUNITY): Payer: Self-pay

## 2022-01-13 ENCOUNTER — Ambulatory Visit (HOSPITAL_COMMUNITY): Payer: Self-pay

## 2022-01-13 LAB — PT/INR: INR: 1.8

## 2022-01-13 NOTE — Progress Notes (Cosign Needed)
Anticoagulation Encounter Note:    Surgical AVR plan   Warfarin start date: 01-06-2019   [facility] Staff: Raina   DX : On-X AVR   INR Range: 1.5 - 2.0  Tab strength: warfarin '2mg'$    Contact 1: [phone] cell   Contact 2:   Lab: POC/[place] - [facility]   Comments: Tylenol regularly    [facility]VI anticoag SCN: Tammi Sou RN ext [phone]    INR: 1.8  Current Dose: '6mg'$  Mon/thurs, '4mg'$  all other days.  Clinical Outcomes     Negatives:  INR In Range          Plan: continue warfarin '6mg'$  Mon/thurs, '4mg'$  all other days.  Recheck INR in two weeks.  Confirmed dosing and testing with patient via [place].    Next INR Date: 4.11.23    [place], Anticoagulation  [facility]  [place]        For additional information, see dosing calendar below.    Warfarin Therapy Instructions   March 2023 Details    Sun Molli Knock Tue Wed Thu Fri Sat        '1               2               3               4                 5               6               7               8               9               10               11                 12               13               14   '$ 2.0   4 mg   See details      15      4 mg         16      6 mg         17      4 mg         18      4 mg           19      4 mg         20      6 mg         21      4 mg         22      4 mg         23      6 mg         24      4 mg         25      4 mg           26  4 mg         27      6 mg         28   1.8   4 mg   See details      29      4 mg         30      6 mg         31      4 mg           Date Details   03/14 Last INR check   INR: 2.0      03/28 This INR check   INR: 1.8              Warfarin Therapy Instructions   April 2023 Details    Sun Mon Tue Wed Thu Fri Sat           1      4 mg           2      4 mg         3      6 mg         4      4 mg         5      4 mg         6      6 mg         7      4 mg         8      4 mg           9      4 mg         10      6 mg         11      4 mg         '12               13                14               15                 16               17               18               19               20               21               22                 23               24               25               26               27               28               '$ 29  30                      Date Details   No additional details    Date of next INR:  01/27/2022

## 2022-01-26 ENCOUNTER — Other Ambulatory Visit: Payer: Self-pay

## 2022-01-27 ENCOUNTER — Ambulatory Visit (HOSPITAL_COMMUNITY): Payer: Self-pay

## 2022-01-27 ENCOUNTER — Encounter (HOSPITAL_COMMUNITY): Payer: Self-pay

## 2022-01-27 LAB — PT/INR: INR: 1.5

## 2022-01-27 NOTE — Progress Notes (Signed)
Anticoagulation Encounter Note:    Surgical AVR plan   Warfarin start date: 01-06-2019   [facility] Staff: Raina   DX : On-X AVR   INR Range: 1.5 - 2.0  Tab strength: warfarin '2mg'$    Contact 1: [phone] cell   Contact 2:   Lab: POC/[place] - [facility]   Comments: Tylenol regularly    [facility]VI anticoag SCN: Tammi Sou RN ext [phone]    INR: 1.5  Current Dose: 6 mg M/Th, 4 mg all other days  Clinical Outcomes     Positives:  INR In Range          Plan: 5 mg today then 6 mg M/Th, 4 mg all other days; recheck in one week. Confirmed dosing and testing with patient via MyChart message.    Next INR Date: 4.18.23    Erie Noe, RN SCN  [place] [facility]  Ext. [phone]              For additional information, see dosing calendar below.    Warfarin Therapy Instructions   March 2023 Details    Sun Molli Knock Tue Wed Thu Fri Sat        '1               2               3               4                 5               6               7               8               9               10               11                 12               13               14               15               16               17               18                 19               20               21               22               23               24               25                 26               '$ 27  28   1.8   4 mg   See details      29      4 mg         30      6 mg         31      4 mg           Date Details   03/28 Last INR check   INR: 1.8              Warfarin Therapy Instructions   April 2023 Details    Sun Mon Tue Wed Thu Fri Sat           1      4 mg           2      4 mg         3      6 mg         4      4 mg         5      4 mg         6      6 mg         7      4 mg         8      4 mg           9      4 mg         10      6 mg         11   1.5   5 mg   See details      12      4 mg         13      6 mg         14      4 mg         15      4 mg           16      4 mg         17      6 mg         18      4 mg         19      4 mg         20      6 mg          21      4 mg         22      4 mg           23      4 mg         24      6 mg         25      4 mg         '26               27               28               29                 '$ 30  Date Details   04/11 This INR check   INR: 1.5       Date of next INR:  02/10/2022

## 2022-02-03 ENCOUNTER — Ambulatory Visit: Payer: Medicare Other | Attending: Family | Admitting: Family

## 2022-02-03 ENCOUNTER — Encounter (HOSPITAL_BASED_OUTPATIENT_CLINIC_OR_DEPARTMENT_OTHER): Payer: Self-pay | Admitting: Family

## 2022-02-03 ENCOUNTER — Other Ambulatory Visit: Payer: Self-pay

## 2022-02-03 VITALS — Temp 97.7°F | Ht 65.43 in | Wt 227.5 lb

## 2022-02-03 DIAGNOSIS — L409 Psoriasis, unspecified: Secondary | ICD-10-CM

## 2022-02-03 DIAGNOSIS — Z808 Family history of malignant neoplasm of other organs or systems: Secondary | ICD-10-CM

## 2022-02-03 DIAGNOSIS — L905 Scar conditions and fibrosis of skin: Secondary | ICD-10-CM

## 2022-02-03 DIAGNOSIS — D485 Neoplasm of uncertain behavior of skin: Secondary | ICD-10-CM

## 2022-02-03 DIAGNOSIS — Z1283 Encounter for screening for malignant neoplasm of skin: Secondary | ICD-10-CM

## 2022-02-03 DIAGNOSIS — Z840 Family history of diseases of the skin and subcutaneous tissue: Secondary | ICD-10-CM

## 2022-02-03 DIAGNOSIS — Z85828 Personal history of other malignant neoplasm of skin: Secondary | ICD-10-CM

## 2022-02-03 MED ORDER — LIDOCAINE 1 %-EPINEPHRINE 1:100,000 INJECTION SOLUTION
15.0000 mL | INTRAMUSCULAR | Status: AC
Start: 2022-02-03 — End: 2022-02-03
  Administered 2022-02-03: 1 mL via INTRADERMAL

## 2022-02-03 NOTE — Progress Notes (Signed)
Dermatology, [facility]  [place] [address]  [phone]    Date:   02/03/2022  Name: [name]  Age: 66 y.o.    Chief complaint: Skin lesions    HPI  [name] is a 66 y.o. female with a history of BCC on her left upper lip s/p MOHs on 03/08/2019 presenting for a skin lesions. Patient has a history of scalp psoriasis. States she has been using Clobetasol solution and derma-smoothe oil with improvement. 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 Patient note an itchy lesion on her back. Unsure of how long it has been present. Notes a tender spot on her left plantar surface. Otherwise denies any new, changing, bleeding, or rapidly growing lesions.  No other skin-related complaints. Her sister has a history of a NMSC. States her daughter has a history of skin cancer but she is unsure of what type.       Review of Systems   Constitutional: Negative for chills and fever.   Skin: Negative for itching and rash.     Current Medications   acetaminophen (TYLENOL) 500 mg Oral Tablet Take 650 mg by mouth Every night Taking 2 daily    ascorbic acid, vitamin C, (VITAMIN C) 500 mg Oral Tablet Take 0.5 Tablets (250 mg total) by mouth Once a day    aspirin 81 mg Oral Tablet, Chewable Take 1 Tablet (81 mg total) by mouth Once a day    Cholecalciferol, Vitamin D3, 50 mcg (2,000 unit) Oral Capsule Take 2,000 Units by mouth Once a day     clobetasoL (TEMOVATE) 0.05 % Cream Apply topically Twice daily    Fluocinolone-Shower Cap 0.01 % Oil Apply to scalp 1-2 times weekly at night.    hydroCHLOROthiazide (HYDRODIURIL) 25 mg Oral Tablet Take 1 Tablet (25 mg total) by mouth Once a day    loratadine (CLARITIN) 10 mg Oral Tablet Take 1 Tablet (10 mg total) by mouth Once a day    metoprolol succinate (TOPROL-XL) 50 mg Oral Tablet Sustained Release 24 hr Take 1 Tablet (50 mg total) by mouth Once a day    nystatin (NYSTOP) 100,000 unit/gram Powder  by Apply Topically route Twice per day as needed    triamcinolone acetonide (ARISTOCORT A) 0.1 % Cream Apply topically Twice daily    warfarin (COUMADIN) 2 mg Oral Tablet Take 3 Tablets (6 mg total) by mouth Every evening Or as directed by Anticoag clinic     Allergies   Allergen Reactions    Iv Contrast Anaphylaxis    Oregano      Hives      Stadol [Butorphanol Tartrate] Mental Status Effect    Tomato Hives/ Urticaria     Past Medical History:   Diagnosis Date    Abdominal hernia 04/29/2020    hx of 3 repairs    Anemia     Aortic stenosis     Arthritis     Atrial fibrillation (CMS HCC) 04/29/2020    one episode  after AVR    Back problem 04/29/2020    LBP    Beta blocker prescribed for left ventricular systolic dysfunction     Blood in stool     positive cologuard.  Has not yet scheduled colonoscopy    Blood thinned due to long-term anticoagulant use 04/29/2020    coumadin    Cancer (CMS HCC)     skin cancer L lip.  Needs MOHS to be  scheduled    Chronic pain 04/29/2020    back and right hip    Colon polyp     Dysrhythmias     Heart murmur 01/04/2019    AVR    High blood pressure     Motion sickness     Obesity     Rash 04/29/2020    scalp psoriasis and involves R ear    Valvular disease     aortic valve stenosis, following with CT surgery, echo 02/2016 and cath 03/2016    Wears glasses          Physical Exam  Vitals: Temperature 36.5 C (97.7 F), temperature source Thermal Scan, height 1.662 m (5' 5.43"), weight 103 kg (227 lb 8.2 oz), not currently breastfeeding.  Physical Exam  Constitutional:       General: She is not in acute distress.     Appearance: Normal appearance.   HENT:      Head:     Skin:     General: Skin is warm and dry.      Coloration: Skin is not pale.          Neurological:      Mental Status: She is alert and oriented to person, place, and time. Mental status is at baseline.      Coordination: Coordination normal.   Psychiatric:         Mood and Affect: Mood normal.     General skin exam was  performed including head, neck, anterior/posterior trunk, bilateral upper & lower extremities and revealed no areas of concern other than those documented.    Assessment and Plan  Problem List Items Addressed This Visit    None  Visit Diagnoses       History of basal cell carcinoma (BCC) of skin    -  Primary    Scalp psoriasis        Neoplasm of uncertain behavior of skin        Relevant Orders    SURGICAL PATHOLOGY SPECIMEN    Skin exam, screening for cancer              #History of BCC s/p MOHs on 03/08/2019 (see #1 on face diagram) - stable   - No reoccurrence noted  -Will follow     #Scalp Psoriasis (See #2 on head diagram) - stable   - We discussed the diagnosis, etiology, natural course and management of this condition. Declines systemic therapy at this time. Consider Otezla in the future  - Continue clobetasol solution BID prn   - Continue Fluocinolone 0.01% oil 1-2 times weekly.  Reviewed risk of skin atrophy with prolonged use.  - Start OTC T-sal (SA shampoo)    #Neoplasm of uncertain behavior  (#2 on body diagram) - new  11[address] Tangential Shave BX OF SKIN code in procedures and charges   -Clinical photography taken  -Size: see diagram above  - Location of biopsy: right upper back  - Differential diagnosis: BCC vs ISK   - TIME OUT: A time out was performed to confirm the correct patient, procedure, and site. Consent obtained, area cleaned, and anesthetized. A shave biopsy was performed.Aluminum chloride was used for hemostasis. Vaseline and bandage were placed over the wound and wound care instructions were given. Patient demonstrated understanding of the instructions. Patient was advised that it would take approximately 2-3 weeks for the pathology results to be available and that I will personally contact the patient at the number provided to further  discuss the pathology results.    # Skin cancer screening  The patient was educated on the importance of avoiding excessive sun exposure and wearing sunscreen  daily SPF 68 or higher.  Advised patient to re-apply sunscreen every 2-3 hours.  Advised the patient to avoid going to and using tanning beds.  Advised to check skin routinely for any changes, especially any new moles or changes in existing moles.      RTC in 6 months or sooner if needed    This is a shared visit with Dr. Amedeo Gory.     I spent 15 minutes with patient    Jamas Lav, APRN,FNP-BC  02/03/2022, 13:46    TIME: 8 min    I personally saw and evaluated the patient. See mid-level's note for additional details. My findings/participation are neoplasm of uncertain behavior on back; agree with biopsy today. Continue to follow with regular skin checks.    Dossie Der, MD

## 2022-02-03 NOTE — Addendum Note (Signed)
Addended by: Nelda Severe D on: 02/03/2022 02:12 PM     Modules accepted: Orders

## 2022-02-04 ENCOUNTER — Encounter (HOSPITAL_BASED_OUTPATIENT_CLINIC_OR_DEPARTMENT_OTHER): Payer: Self-pay | Admitting: Family

## 2022-02-04 DIAGNOSIS — I10 Essential (primary) hypertension: Secondary | ICD-10-CM

## 2022-02-04 DIAGNOSIS — Z13220 Encounter for screening for lipoid disorders: Secondary | ICD-10-CM

## 2022-02-04 MED ORDER — TRIAMCINOLONE ACETONIDE 0.1 % TOPICAL CREAM
TOPICAL_CREAM | Freq: Two times a day (BID) | CUTANEOUS | 3 refills | Status: DC
Start: 2022-02-04 — End: 2023-12-22

## 2022-02-04 NOTE — Telephone Encounter (Addendum)
Labs pended. Please advise.  Thanks,  Beryl Meager, RN  02/04/2022, 12:10    From: [name]  To: Derek Jack, APRN,FNP-BC  Sent: 02/04/2022  8:34 AM EDT  Subject: Blood tests    My appointment with you is scheduled for April 27th and I know I need to come in for blood tests. Remind me, when do I need to have the fasting blood tests?     Thanks   [employer]

## 2022-02-04 NOTE — Addendum Note (Signed)
Addended by: Jamas Lav on: 02/04/2022 08:10 AM     Modules accepted: Orders

## 2022-02-05 DIAGNOSIS — L570 Actinic keratosis: Secondary | ICD-10-CM

## 2022-02-05 LAB — SURGICAL PATHOLOGY SPECIMEN

## 2022-02-06 ENCOUNTER — Encounter (HOSPITAL_BASED_OUTPATIENT_CLINIC_OR_DEPARTMENT_OTHER): Payer: Self-pay | Admitting: Family

## 2022-02-09 ENCOUNTER — Other Ambulatory Visit: Payer: Medicare Other | Attending: Family | Admitting: Gynecology

## 2022-02-09 ENCOUNTER — Encounter (INDEPENDENT_AMBULATORY_CARE_PROVIDER_SITE_OTHER): Payer: Self-pay | Admitting: Ophthalmology

## 2022-02-09 ENCOUNTER — Other Ambulatory Visit: Payer: Self-pay

## 2022-02-09 DIAGNOSIS — I10 Essential (primary) hypertension: Secondary | ICD-10-CM | POA: Insufficient documentation

## 2022-02-09 DIAGNOSIS — Z13220 Encounter for screening for lipoid disorders: Secondary | ICD-10-CM

## 2022-02-09 LAB — LIPID PANEL
CHOL/HDL RATIO: 2.5
CHOLESTEROL: 189 mg/dL (ref 100–200)
HDL CHOL: 75 mg/dL (ref >=50)
LDL CALC: 103 mg/dL — ABNORMAL HIGH (ref <100)
NON-HDL: 114 mg/dL (ref <=190)
TRIGLYCERIDES: 61 mg/dL (ref <150)
VLDL CALC: 10 mg/dL (ref <30)

## 2022-02-09 LAB — BASIC METABOLIC PANEL, FASTING
ANION GAP: 8 mmol/L (ref 4–13)
BUN/CREA RATIO: 24 — ABNORMAL HIGH (ref 6–22)
BUN: 19 mg/dL (ref 8–25)
CALCIUM: 8.8 mg/dL (ref 8.8–10.2)
CHLORIDE: 100 mmol/L (ref 96–111)
CO2 TOTAL: 27 mmol/L (ref 23–31)
CREATININE: 0.78 mg/dL (ref 0.60–1.05)
ESTIMATED GFR: 83 mL/min/BSA (ref >=60)
GLUCOSE: 84 mg/dL (ref 70–99)
POTASSIUM: 4.1 mmol/L (ref 3.5–5.1)
SODIUM: 135 mmol/L — ABNORMAL LOW (ref 136–145)

## 2022-02-10 ENCOUNTER — Ambulatory Visit: Payer: Medicare Other | Attending: Cardiovascular Disease | Admitting: Hospitalist

## 2022-02-10 ENCOUNTER — Encounter (HOSPITAL_COMMUNITY): Payer: Self-pay | Admitting: Hospitalist

## 2022-02-10 ENCOUNTER — Encounter (HOSPITAL_COMMUNITY): Payer: Self-pay

## 2022-02-10 ENCOUNTER — Ambulatory Visit (HOSPITAL_COMMUNITY): Payer: Self-pay

## 2022-02-10 VITALS — BP 116/77 | HR 76 | Temp 97.3°F | Ht 65.43 in | Wt 228.6 lb

## 2022-02-10 DIAGNOSIS — I77819 Aortic ectasia, unspecified site: Secondary | ICD-10-CM

## 2022-02-10 DIAGNOSIS — Z952 Presence of prosthetic heart valve: Secondary | ICD-10-CM | POA: Insufficient documentation

## 2022-02-10 DIAGNOSIS — I340 Nonrheumatic mitral (valve) insufficiency: Secondary | ICD-10-CM

## 2022-02-10 LAB — PT/INR: INR: 1.8

## 2022-02-10 MED ORDER — METOPROLOL SUCCINATE ER 50 MG TABLET,EXTENDED RELEASE 24 HR
50.0000 mg | ORAL_TABLET | Freq: Every day | ORAL | 4 refills | Status: DC
Start: 2022-02-10 — End: 2023-02-22

## 2022-02-10 NOTE — Progress Notes (Signed)
[facility] Cardiology  Outpatient Progress Note  Information Obtained from: patient and history reviewed via medical record  Chief Complaint:  AVR    A/P:    66 YO F seen for follow up of her mechanical aortic valve, an On-X 21, placed 01/04/2019. Her f/u TTE showed a well functioning prosthesis.    Severe AS s/p AVR with an On-X 21 01/04/2019  Mild mitral regurgitation  Mild aortic dilation  For AVR repeat TTE at 5 and 10 years post operatively or with changes in symptoms.  The patient should take antibiotic prophylaxis lifelong given the presence of a prosthetic valve, 30-60 minutes prior to dental work, respiratory tract procedures, skin / soft tissue procedures. Oral amoxicillin 2 grams.   Yearly CBC or with any sides of bleeding for coumadin monitoring.  CBC ordered  Toprol refilled.  Follow TAA on CT lung cancer screening yearly.    Interval History:    66 YO F last seen 12/03/20 who follows with [place] for AVR in 01/04/2019 with a 21 mm On-X mechanical aortic valve. She has no cardiac complaints, she is experiencing occasional squeezing in her chest, sporadic, lasting only seconds, non-exertional.    TTE 01/08/2020 which showed no AS, mild AI, mean gradient of 9 mmHg and max gradient of 16 mmHg, which is a normal gradient for the valve type and size. EF was 63.1%.  ICA 12/06/2018 with normal coronary arteries  SAVR 01/04/2019 with placement of a 21 mm On-X mechanical valve.    Medical History:  Current Outpatient Medications   Medication Sig   . acetaminophen (TYLENOL) 500 mg Oral Tablet Take 650 mg by mouth Every night Taking 2 daily   . ascorbic acid, vitamin C, (VITAMIN C) 500 mg Oral Tablet Take 0.5 Tablets (250 mg total) by mouth Once a day   . aspirin 81 mg Oral Tablet, Chewable Take 1 Tablet (81 mg total) by mouth Once a day   . Cholecalciferol, Vitamin D3, 50 mcg (2,000 unit) Oral Capsule Take 2,000 Units by mouth Once a day    . clobetasoL (TEMOVATE) 0.05 % Cream Apply topically Twice daily   .  Fluocinolone-Shower Cap 0.01 % Oil Apply to scalp 1-2 times weekly at night.   . hydroCHLOROthiazide (HYDRODIURIL) 25 mg Oral Tablet Take 1 Tablet (25 mg total) by mouth Once a day   . loratadine (CLARITIN) 10 mg Oral Tablet Take 1 Tablet (10 mg total) by mouth Once a day   . metoprolol succinate (TOPROL-XL) 50 mg Oral Tablet Sustained Release 24 hr Take 1 Tablet (50 mg total) by mouth Once a day   . nystatin (NYSTOP) 100,000 unit/gram Powder by Apply Topically route Twice per day as needed   . triamcinolone acetonide (ARISTOCORT A) 0.1 % Cream Apply topically Twice daily   . warfarin (COUMADIN) 2 mg Oral Tablet Take 3 Tablets (6 mg total) by mouth Every evening Or as directed by [place] clinic (Patient taking differently: Take 3 Tablets (6 mg total) by mouth Every evening Or as directed by Anticoagulant clinic)     Allergies   Allergen Reactions   . Iv Contrast Anaphylaxis   . Oregano      Hives     . Stadol [Butorphanol Tartrate] Mental Status Effect   . Tomato Hives/ Urticaria     Past Medical History:   Diagnosis Date   . Abdominal hernia 04/29/2020    hx of 3 repairs   . Anemia    . Aortic stenosis    .  Arthritis    . Atrial fibrillation (CMS HCC) 04/29/2020    one episode  after AVR   . Back problem 04/29/2020    LBP   . Beta blocker prescribed for left ventricular systolic dysfunction    . Blood in stool     positive cologuard.  Has not yet scheduled colonoscopy   . Blood thinned due to long-term anticoagulant use 04/29/2020    coumadin   . Cancer (CMS HCC)     skin cancer L lip.  Needs MOHS to be scheduled   . Chronic pain 04/29/2020    back and right hip   . Colon polyp    . Dysrhythmias    . Heart murmur 01/04/2019    AVR   . High blood pressure    . Motion sickness    . Obesity    . Rash 04/29/2020    scalp psoriasis and involves R ear   . Valvular disease     aortic valve stenosis, following with CT surgery, echo 02/2016 and cath 03/2016   . Wears glasses          Past Surgical History:   Procedure  Laterality Date   . HX AORTIC VALVE REPLACEMENT     . HX CESAREAN SECTION  1984   . HX HEART CATHETERIZATION      x2 with no intervention per pt report   . HX HERNIA REPAIR  2004, 2006, 2008    x3   . HX HIP REPLACEMENT Left 08/11/2016   . HX HYSTERECTOMY  2003   . HX OOPHORECTOMY      1 ovary         Family Medical History:     Problem Relation (Age of Onset)    Atrial fibrillation Sister    Heart Disease Mother    Hypertension (High Blood Pressure) Father    Lung Cancer Father, Brother    Macular Degen Maternal Aunt    Mitral valve prolapse Sister    Retinal Detachment Sister    Sudden Death no cause Mother          Social History     Socioeconomic History   . Marital status: Single   Occupational History   . Occupation: photography    Tobacco Use   . Smoking status: Former     Packs/day: 1.00     Years: 42.00     Pack years: 42.00     Types: Cigarettes     Start date: 69     Quit date: 2014     Years since quitting: 9.3   . Smokeless tobacco: Never   Vaping Use   . Vaping Use: Never used   Substance and Sexual Activity   . Alcohol use: Yes     Comment: social only   . Drug use: Never   . Sexual activity: Not Currently   Other Topics Concern   . Ability to Walk 1 Flight of Steps without SOB/CP Yes   . Routine Exercise No   . Ability to Walk 2 Flight of Steps without SOB/CP No   . Ability To Do Own ADL's Yes   . Other Activity Level Yes     Comment: Cooking, cleaning, laundry, and carries groceries       ROS  Other than HPI a 10 point ROS is negative.    Objective  Temperature: 36.3 C (97.3 F)  Heart Rate: 76  BP (Non-Invasive): 116/77  SpO2: 95 %  Constitutional: This is  a well developed well nourished female  Eyes: EOMI, no scleral jaundice or conjunctival erythema noted.   HENT: Normocephalic and atraumatic.  Neck:  Supple, symmetric, trachea midline, no masses. The thyroid appears normal. There is no JVD.  Respiratory: Lungs are clear to auscultation. No wheezing, rales or rhonchi noted. There is normal  respiratory effort.  Cardiovascular: S1, is normal mechanical S2 The rhythm is regular.  Lower Extremities:  No cyanosis or edema noted.  Gastrointestinal: Normal bowel sounds  The abdomen is soft, non-distended, non-tender with out guarding or rebound. There are no masses appreciated.  Musculoskeletal: No obvious deformity or swelling.   Skin: normal coloration and turgor, no rashes noted.  Neurologic: CN's Grossly intact, alert and oriented X 3.  Psychiatric: appears in good mood, fluent in speech and cognition.     Alda Berthold, MD  Fellow, Cardiovascular Disease

## 2022-02-10 NOTE — Progress Notes (Signed)
Anticoagulation Encounter Note:    Surgical AVR plan   Warfarin start date: 01-06-2019   [facility] Staff: Tannon Peerson   DX : On-X AVR   INR Range: 1.5 - 2.0  Tab strength: warfarin '2mg'$    Contact 1: [phone] cell   Contact 2:   Lab: POC/ - [facility]   Comments: Tylenol regularly    [facility] anticoag SCN: Tammi Sou RN ext [phone]    INR: 1.8  Current Dose: 6 mg M/Th, 4 mg all other days (1 extra mg on one day due to being on low end of range)   Clinical Outcomes       Positives:  INR In Range            Plan: 6 mg M/Th, 4 mg all other days; recheck in 2 weeks.  Confirmed dosing and testing with patient via MyChart message.      Next INR Date: 5.9.23    Erie Noe, RN SCN  [facility]  Ext. [phone]           For additional information, see dosing calendar below.    Warfarin Therapy Instructions   April 2023 Details    Sun [name] Thu Fri Sat           '1                 2               3               4               5               6               7               8                 9               10               11   '$ 1.5   5 mg   See details      12      4 mg         13      6 mg         14      4 mg         15      4 mg           16      4 mg         17      6 mg         18      4 mg         19      4 mg         20      6 mg         21      4 mg         22      4 mg           23      4 mg         24      6 mg  25   1.8   4 mg   See details      26      4 mg         27      6 mg         28      4 mg         29      4 mg           30      4 mg                Date Details   04/11 Last INR check     INR: 1.5      04/25 This INR check     INR: 1.8              Warfarin Therapy Instructions   May 2023 Details    Sun Mon Tue Wed Thu Fri Sat      1      6 mg         2      4 mg         3      4 mg         4      6 mg         5      4 mg         6      4 mg           7      4 mg         8      6 mg         9      4 mg         '10               11               12               13                 14               15                16               17               18               19               20                 21               22               23               24               25               26               27                 '$ 28  $'29               30               31                   'B$ Date Details   No additional details    Date of next INR:  02/24/2022

## 2022-02-12 ENCOUNTER — Other Ambulatory Visit (HOSPITAL_BASED_OUTPATIENT_CLINIC_OR_DEPARTMENT_OTHER): Payer: Medicare Other | Admitting: Gynecology

## 2022-02-12 ENCOUNTER — Encounter (HOSPITAL_BASED_OUTPATIENT_CLINIC_OR_DEPARTMENT_OTHER): Payer: Self-pay | Admitting: Family

## 2022-02-12 ENCOUNTER — Ambulatory Visit: Payer: Medicare Other | Attending: Family | Admitting: Family

## 2022-02-12 ENCOUNTER — Other Ambulatory Visit: Payer: Self-pay

## 2022-02-12 VITALS — BP 132/80 | HR 67 | Temp 97.1°F | Ht 65.0 in | Wt 227.5 lb

## 2022-02-12 DIAGNOSIS — E559 Vitamin D deficiency, unspecified: Secondary | ICD-10-CM

## 2022-02-12 DIAGNOSIS — I9789 Other postprocedural complications and disorders of the circulatory system, not elsewhere classified: Secondary | ICD-10-CM

## 2022-02-12 DIAGNOSIS — Z952 Presence of prosthetic heart valve: Secondary | ICD-10-CM

## 2022-02-12 DIAGNOSIS — Z6837 Body mass index (BMI) 37.0-37.9, adult: Secondary | ICD-10-CM | POA: Insufficient documentation

## 2022-02-12 DIAGNOSIS — I10 Essential (primary) hypertension: Secondary | ICD-10-CM

## 2022-02-12 DIAGNOSIS — I4891 Unspecified atrial fibrillation: Secondary | ICD-10-CM | POA: Insufficient documentation

## 2022-02-12 DIAGNOSIS — L409 Psoriasis, unspecified: Secondary | ICD-10-CM

## 2022-02-12 DIAGNOSIS — Z23 Encounter for immunization: Secondary | ICD-10-CM

## 2022-02-12 DIAGNOSIS — Z0000 Encounter for general adult medical examination without abnormal findings: Secondary | ICD-10-CM

## 2022-02-12 LAB — CBC WITH DIFF
BASOPHIL #: <0.1 10*3/uL (ref <=0.20)
BASOPHIL %: 1 %
EOSINOPHIL #: <0.1 10*3/uL (ref <=0.50)
EOSINOPHIL %: 2 %
HCT: 43.1 % (ref 34.8–46.0)
HGB: 14 g/dL (ref 11.5–16.0)
IMMATURE GRANULOCYTE #: <0.1 10*3/uL (ref <0.10)
IMMATURE GRANULOCYTE %: 0 % (ref 0–1)
LYMPHOCYTE #: 1.64 10*3/uL (ref 1.00–4.80)
LYMPHOCYTE %: 31 %
MCH: 29.6 pg (ref 26.0–32.0)
MCHC: 32.5 g/dL (ref 31.0–35.5)
MCV: 91.1 fL (ref 78.0–100.0)
MONOCYTE #: 0.41 10*3/uL (ref 0.20–1.10)
MONOCYTE %: 8 %
MPV: 8.9 fL (ref 8.7–12.5)
NEUTROPHIL #: 3.15 10*3/uL (ref 1.50–7.70)
NEUTROPHIL %: 58 %
PLATELETS: 282 10*3/uL (ref 150–400)
RBC: 4.73 10*6/uL (ref 3.85–5.22)
RDW-CV: 12.4 % (ref 11.5–15.5)
WBC: 5.3 10*3/uL (ref 3.7–11.0)

## 2022-02-12 MED ORDER — HYDROCHLOROTHIAZIDE 25 MG TABLET
25.0000 mg | ORAL_TABLET | Freq: Every day | ORAL | 4 refills | Status: DC
Start: 2022-02-12 — End: 2022-04-02

## 2022-02-12 MED ORDER — CLOBETASOL 0.05 % TOPICAL CREAM
TOPICAL_CREAM | Freq: Two times a day (BID) | CUTANEOUS | 1 refills | Status: DC
Start: 2022-02-12 — End: 2023-08-04

## 2022-02-12 NOTE — Progress Notes (Signed)
FAMILY MEDICINE, [facility]  [place] [address]  [address]  Medicare Annual Wellness Visit    Name: [name] MRN:  [id number]   Date: 02/12/2022 Age: 66 y.o.       SUBJECTIVE:   [name] is a 66 y.o. female for presenting for Medicare Wellness exam.   I have reviewed and reconciled the medication list with the patient today.    Comprehensive Health Assessment-Adult 01/09/2020 01/13/2021 02/12/2022   Do you wish to complete this form? Yes - Yes   During the past 4 weeks, how would you rate your health in general? Good Good Very Good   During the past 4 weeks, how much difficulty have you had doing your usual activities inside and outside your home because of medical or emotional problems? Some difficulty Some difficulty A little bit of difficulty   During the past 4 weeks, was someone available to help you if you needed and wanted help? Yes, some Yes, some Yes, as much as I wanted   In the past year, how many times have you gone to the emergency department or been admitted to a hospital for a health problem? 1 time None 1 time   Are you generally satisfied with your sleep? No No Yes   Do you have enough money to buy things you need in everyday life, such as food, clothing, medicines, and housing? Yes, always Yes, always Yes, always   Can you get to places beyond walking distance without help?  (For example, can you drive your own car or travel alone on buses)? Yes Yes Yes   Do you fasten your seatbelt when you are in a car? Yes, usually Yes, usually Yes, usually   Do you exercise 20 minutes 3 or more days per week (such as walking, dancing, biking, mowing grass, swimming)? No, I usually don't exercise this much Yes, some of the time Yes, most of the time   How often do you eat food that is healthy (fruits, vegetables, lean meats) instead of unhealthy (sweets, fast food, junk food, fatty foods)? Most of the time Most of the time Most of the time    Have your parents, brothers or sisters had any of the following problems before the age of 28? (check all that apply) - - Heart problems, or hardening of the arteries;Cancer;High cholesterol;Alcohol or drug addiction (or abuse)   How often do you have trouble taking medicines the eay you are told to take them? I always take them as prescribed I always take them as prescribed I always take them as prescribed   Do you need any help communicating with your doctors and nurses because of vision or hearing problems? No No No   During the past 12 months, have you experienced confusion or memory loss that is happening more often or is getting worse? No No No   Do you have one person you think of as your personal doctor (primary care provider or family doctor)? - - Yes   If you are seeing a Primary Care Provider (PCP) or family doctor. please list their name - - Chelsae Zanella   Are you now also seeing any specialist physician(s) (such as eye doctor, foot doctor, skin doctor)? Yes Yes Yes   If you are seeing a specialist for anything such as foot, eye, skin, etc.  please list their name(s) Lowry Ram and Bandy - dr for hip, derm, cardio   How confident are  you that you can control or manage most of your health problems? Somewhat confident Very confident Very confident         I have reviewed and updated as appropriate the past medical, family and social history. 02/12/2022 as summarized below:  Past Medical History:   Diagnosis Date   . Abdominal hernia 04/29/2020    hx of 3 repairs   . Anemia    . Aortic stenosis    . Arthritis    . Atrial fibrillation (CMS HCC) 04/29/2020    one episode  after AVR   . Back problem 04/29/2020    LBP   . Beta blocker prescribed for left ventricular systolic dysfunction    . Blood in stool     positive cologuard.  Has not yet scheduled colonoscopy   . Blood thinned due to long-term anticoagulant use 04/29/2020    coumadin   . Cancer (CMS HCC)     skin cancer L lip.  Needs MOHS to be scheduled    . Chronic pain 04/29/2020    back and right hip   . Colon polyp    . Dysrhythmias    . Heart murmur 01/04/2019    AVR   . High blood pressure    . Motion sickness    . Obesity    . Rash 04/29/2020    scalp psoriasis and involves R ear   . Valvular disease     aortic valve stenosis, following with CT surgery, echo 02/2016 and cath 03/2016   . Wears glasses      Past Surgical History:   Procedure Laterality Date   . Hx aortic valve replacement     . Hx cesarean section  1984   . Hx heart catheterization     . Hx hernia repair  2004, 2006, 2008   . Hx hip replacement Left 08/11/2016   . Hx hysterectomy  2003   . Hx oophorectomy       Current Outpatient Medications   Medication Sig   . acetaminophen (TYLENOL) 500 mg Oral Tablet Take 650 mg by mouth Every night Taking 2 daily   . ascorbic acid, vitamin C, (VITAMIN C) 500 mg Oral Tablet Take 0.5 Tablets (250 mg total) by mouth Once a day   . aspirin 81 mg Oral Tablet, Chewable Take 1 Tablet (81 mg total) by mouth Once a day   . Bifidobacterium infantis (ALIGN ORAL) Take by mouth   . Cholecalciferol, Vitamin D3, 50 mcg (2,000 unit) Oral Capsule Take 2,000 Units by mouth Once a day    . clobetasoL (TEMOVATE) 0.05 % Cream Apply topically Twice daily   . Fluocinolone-Shower Cap 0.01 % Oil Apply to scalp 1-2 times weekly at night.   . hydroCHLOROthiazide (HYDRODIURIL) 25 mg Oral Tablet Take 1 Tablet (25 mg total) by mouth Once a day   . loratadine (CLARITIN) 10 mg Oral Tablet Take 1 Tablet (10 mg total) by mouth Once a day   . metoprolol succinate (TOPROL-XL) 50 mg Oral Tablet Sustained Release 24 hr Take 1 Tablet (50 mg total) by mouth Once a day   . nystatin (NYSTOP) 100,000 unit/gram Powder by Apply Topically route Twice per day as needed   . triamcinolone acetonide (ARISTOCORT A) 0.1 % Cream Apply topically Twice daily   . warfarin (COUMADIN) 2 mg Oral Tablet Take 3 Tablets (6 mg total) by mouth Every evening Or as directed by [facility] (Patient taking differently:  Take 3 Tablets (6 mg total) by  mouth Every evening Or as directed by Anticoagulant clinic)     Family Medical History:     Problem Relation (Age of Onset)    Atrial fibrillation Sister    Heart Disease Mother    Hypertension (High Blood Pressure) Father    Lung Cancer Father, Brother    Macular Degen Maternal Aunt    Mitral valve prolapse Sister    Retinal Detachment Sister    Sudden Death no cause Mother          Social History     Socioeconomic History   . Marital status: Single   Occupational History   . Occupation: photography    Tobacco Use   . Smoking status: Former     Packs/day: 1.00     Years: 42.00     Pack years: 42.00     Types: Cigarettes     Start date: 18     Quit date: 2014     Years since quitting: 9.3   . Smokeless tobacco: Never   Vaping Use   . Vaping Use: Never used   Substance and Sexual Activity   . Alcohol use: Yes     Comment: rarely   . Drug use: Never   . Sexual activity: Not Currently   Other Topics Concern   . Ability to Walk 1 Flight of Steps without SOB/CP Yes   . Routine Exercise No   . Ability to Walk 2 Flight of Steps without SOB/CP No   . Ability To Do Own ADL's Yes   . Other Activity Level Yes     Comment: Cooking, cleaning, laundry, and carries groceries        List of Current Health Care Providers   Care Team     PCP     Name Type Specialty Phone Number    Derek Jack, [place] Nurse Practitioner [facility] [phone]          Care Team     Name Type Specialty Phone Number    Joaquin Bend, MD Physician CARDIOVASCULAR DISEASE [phone]    [place] Team, [facility] Set Not available Not available Not available    [facility] Nurse Practitioner NURSE PRACTITIONER [phone]    [name], MD Physician ADULT RECONSTRUCTIVE ORTHOPAEDIC SURGERY [phone]    Erie Noe, RN Registered Nurse Not available Not available                  Health Maintenance   Topic Date Due   . Shingles Vaccine (1 of 2) Never done   . CT Lung  Cancer Screening  02/19/2022   . Depression Screening  02/13/2023   . Annual Wellness Visit  02/13/2023   . Mammography  09/03/2023   . Adult Tdap-Td (2 - Td or Tdap) 11/08/2028   . Osteoporosis screening  02/27/2030   . Colonoscopy  05/14/2030   . Hepatitis C screening  Completed   . Influenza Vaccine  Completed   . Covid-19 Vaccine  Completed   . Pneumococcal Vaccination, Age 41+  Completed   . Meningococcal Vaccine  Aged Out     Medicare Wellness Assessment   Medicare initial or wellness physical in the last year?: No  Advance Directives (optional)   Does patient have a living will or MPOA: YES   Has patient provided [employer] with a copy?: YES          Activities of Daily Living   Do you need help with dressing, bathing, or  walking?: No   Do you need help with shopping, housekeeping, medications, or finances?: No   Do you have rugs in hallways, broken steps, or poor lighting?: No   Do you have grab bars in your bathroom, non-slip strips in your tub, and hand rails on your stairs?: Yes   Urinary Incontinence Screen (Women >=65 only)   Do you ever leak urine when you don't want to?: YES   Cognitive Function Screen (1=Yes, 0=No)   What is you age?: Correct   What is the time to the nearest hour?: Correct   What is the year?: Correct   What is the name of this clinic?: Correct   Can the patient recognize two persons (the doctor, the nurse, home help, etc.)?: Correct   What is the date of your birth? (day and month sufficient) : Correct   In what year did World War II end?: Correct   Who is the current president of the [place]?: Correct   Count from 20 down to 1?: Correct   What address did I give you earlier?: Correct   Total Score: 10   Interpretation of Total Score: Greater than 6 Normal   Hearing Screen   Have you noticed any hearing difficulties?: No  After whispering 9-1-6 how many numbers did the patient repeat correctly?: 3   Fall Risk Screen   Do you feel unsteady when standing or walking?: No  Do  you worry about falling?: No (concerned because of hip replacements)  Have you fallen in the past year?: No   Vision Screen   Right Eye = 20:  (wears glasses, just saw eye doctor in december)   Left Eye = 20:  (wears glasses, just saw eye doctor in december)   Depression Screen   Little interest or pleasure in doing things.: Not at all  Feeling down, depressed, or hopeless: Not at all  PHQ 2 Total: 0   Pain Score   Pain Score:   0 - No pain    Substance Use-Abuse Screening   Tobacco Use   In Past 12 MONTHS, how often have you used any tobacco product (for example, cigarettes, e-cigarettes, cigars, pipes, or smokeless tobacco)?: Never   Alcohol use   In the PAST 12 MONTHS, how often have you had 5 (men)/4 (women) or more drinks containing alcohol in one day?: Never   Prescription Drug Use   In the PAST 12 months, how often have you used any prescription medications just for the feeling, more than prescribed, or that were not prescribed for you? Prescriptions may include: opioids, benzodiazepines, medications for ADHD: Never         Illicit Drug Use   In the PAST 12 MONTHS, how often have you used any drugs, including marijuana, cocaine or crack, heroin, methamphetamine, hallucinogens, ecstasy/MDMA?: Never           OBJECTIVE:   BP 132/80   Pulse 67   Temp 36.2 C (97.1 F)   Ht 1.651 m ('5\' 5"'$ )   Wt 103 kg (227 lb 8.2 oz)   BMI 37.86 kg/m      Other appropriate exam:  General: appears in good health  HENT: ENT without erythema or injection, mucous membranes moist.  Lungs: clear to auscultation bilaterally.   Cardiovascular: Heart regular rate and rhythm without murmer  Abdomen: soft, non-tender, bowel sounds normal and non-distended  Psychiatric: normal affect, behavior, memory, thought content, judgement, and speech.      Health Maintenance Due  Topic Date Due   . Shingles Vaccine (1 of 2) Never done   . CT Lung Cancer Screening  02/19/2022      ASSESSMENT & PLAN:     ICD-10-CM    1. Encounter for Medicare  annual wellness exam  Z00.00       2. Psoriasis  L40.9 clobetasoL (TEMOVATE) 0.05 % Cream      3. Essential hypertension  I10 hydroCHLOROthiazide (HYDRODIURIL) 25 mg Oral Tablet      4. Vitamin D deficiency  E55.9 VITAMIN D 25 TOTAL      5. Postoperative atrial fibrillation (CMS HCC)  I97.89     I48.91       6. BMI 37.0-37.9, adult  Z68.37 Refer to [facility] Dietician-[facility]      7. Need for vaccination  Z23 Vaxneuvance (PCV 15) (Admin)           Identified Risk Factors/ Recommended Actions     The PHQ 2 Total: 0 depression screen is interpreted as negative.  Urinary Incontinence Plan of Care: Behavioral interventions with bladder training, pelvic floor muscle training, and/or prompted voiding    Orders Placed This Encounter   . Vaxneuvance (PCV 15) (Admin)   . VITAMIN D 25 TOTAL   . Refer to [facility] Dietician-[facility]   . clobetasoL (TEMOVATE) 0.05 % Cream   . hydroCHLOROthiazide (HYDRODIURIL) 25 mg Oral Tablet        The patient has been educated about risk factors and recommended preventive care. Written Prevention Plan completed/ updated and given to patient (see After Visit Summary).    Return in about 1 year (around 02/13/2023) for [facility] wellness.    Derek Jack, APRN,FNP-BC

## 2022-02-12 NOTE — Nursing Note (Signed)
Immunization administered     Name Date Dose VIS Date Route    VAXNEUVANCE(PCV 15) 02/12/2022 0.5 mL 11/22/2020 Intramuscular    Site: Left arm    Given By: Arnette Felts, MA    Manufacturer: [employer]    Lot: [id number]    [place]: [phone]          Did patient have any prior reaction to any vaccine: no    The patient/ caregiver was given the opportunity to ask specific questions regarding vaccinations and review corresponding Vaccine Information Statements prior to administration of vaccines.     [place], [place]  02/12/2022, 13:09

## 2022-02-12 NOTE — Patient Instructions (Signed)
Medicare Preventive Services  Medicare coverage information Recommendation for YOU   Heart Disease and Diabetes   Lipid profile Every 5 years or more often if at risk for cardiovascular disease  Last Lipid Panel  (Last result in the past 2 years)      Cholesterol   HDL   LDL   Direct LDL   Triglycerides      02/09/22 0810 189   75   103  Comment: <100 mg/dL, Optimal  100-129 mg/dL, Near/Above Optimal  130-159 mg/dL, Borderline High  160-189 mg/dL, High  >=190 mg/dL, Very high     61           Diabetes Screening  yearly for those at risk for diabetes, 2 tests per year for those with prediabetes Last Glucose: 84    Diabetes Self Management Training or Medical Nutrition Therapy  For those with diabetes, up to 10 hrs initial training within a year, subsequent years up to 2 hrs of follow up training Optional for those with diabetes     Medical Nutrition Therapy Three hours of one-on-one counseling in first year, two hours in subsequent years Optional for those with diabetes, kidney disease   Intensive Behavioral Therapy for Obesity  Face-to-face counseling, first month every week, month 2-6 every other week, month 7-12 every month if continued progress is documented Optional for those with Body Mass Index 30 or higher  Your Body mass index is 37.86 kg/m.   Tobacco Cessation (Quitting) Counseling   Two attempts per year, max 4 sessions per attempt, up to 8 per year, for those with tobacco-related health condition Optional for those that use tobacco   Cancer Screening   Colorectal screening   For anyone age 70 to 65 or any age if high risk:  . Screening Colonoscopy every 10 yrs if low risk,  more frequent if higher risk  OR  . Cologuard Stool DNA test once every 3 years OR  . Fecal Occult Blood Testing yearly OR  . Flexible  Sigmoidoscopy  every 5 yr OR  . CT Colonography every 5 yrs    See your schedule below   Screening Pap Test Recommended every 3 years for all women age 48 to 81, or every five years if combined with  HPV test (routine screening not needed after total hysterectomy).  Medicare covers every 2 years, up to yearly if high risk.  Screening Pelvic Exam Medicare covers every 2 years, yearly if high risk or childbearing age with abnormal Pap in last 3 yrs. See your schedule below   Screening Mammogram   Recommended every 2 years for women age 78 to 33, or more frequent if you have a higher risk. Selectively recommended for women between 40-49 based on shared decisions about risk. Covered by Medicare up to every year for women age 46 or older See your schedule below   Lung Cancer Screening  Annual low dose computed tomography (LDCT scan) is recommended for those age 62-77 who smoked 20 pack-years and are current smokers or quit smoking within past 15 years (one pack-year= smoking one PPD for one year), after counseling by your doctor or nurse clinician about the possible benefits or harms. See your schedule below   Vaccinations   Pneumococcal Vaccine: Recommended routinely age 39+ with one or two separate vaccines based on your risk    Recommended before age 45 if medical conditions with increased risk  Seasonal Influenza Vaccine: Once every flu season   Hepatitis B Vaccine:  3 doses if risk (including anyone with diabetes or liver disease)  Shingles Vaccine: Two doses at age 77 or older  Diphtheria Tetanus Pertussis Vaccine: ONCE as adult, booster every 10 years     Immunization History   Administered Date(s) Administered   . Covid-19 Vaccine,Pfizer Bivalent,59mg/0.3ml,12 yrs+ 07/15/2021   . Covid-19 Vaccine,Pfizer-BioNTech,Purple Top,143yr 12/13/2019, 01/03/2020, 08/26/2020   . Influenza Vaccine, 6 month-adult 07/11/2019, 07/15/2021   . Influenza Vaccine, 65+ 08/26/2020   . Pneumovax 01/13/2021   . Tetanus Toxoid/Diphtheria Toxoid/Acellular Pertussis Vaccine, Adsorbed (Tdap) 11/08/2018     Shingles vaccine and Diphtheria Tetanus Pertussis vaccines are available at pharmacies or local health department without a  prescription.   Other Screening   Bone Densitometry   Every 24 months for anyone at risk, including postmenopausal       Glaucoma Screening   Yearly if in high risk group such as diabetes, family history, African American age 67+r Hispanic American age 67+    Hepatitis C Screening recommended ONCE for those born between 1945-1965, or high risk for HCV infection       HIV Testing recommended routinely at least ONCE, covered every year for age 5555o 657egardless of risk, and every year for age over 6551ho ask for the test or higher risk  Yearly or up to 3 times in pregnancy         Abdominal Aortic Aneurysm Screening Ultrasound   Once between the age of 67-75ith a family history of AAA       Your Personalized Schedule for Preventive Tests   Health Maintenance: Pending and Last Completed       Date Due Completion Date    Shingles Vaccine (1 of 2) Never done ---    Pneumococcal Vaccination, Age 46+ (2 - PCV) 01/13/2022 01/13/2021    CT Lung Cancer Screening 02/19/2022 02/19/2021    Depression Screening 02/13/2023 02/12/2022    Annual Wellness Visit 02/13/2023 02/12/2022    Mammography 09/03/2023 09/02/2021    Adult Tdap-Td (2 - Td or Tdap) 11/08/2028 11/08/2018    Osteoporosis screening 02/27/2030 02/28/2020    Colonoscopy 05/14/2030 05/14/2020

## 2022-02-13 ENCOUNTER — Encounter (HOSPITAL_COMMUNITY): Payer: Self-pay | Admitting: Hospitalist

## 2022-02-13 LAB — VITAMIN D 25 TOTAL: VITAMIN D, 25OH: 42 ng/mL (ref 30–100)

## 2022-02-23 ENCOUNTER — Telehealth: Payer: Medicare Other | Attending: Registered" | Admitting: Registered"

## 2022-02-23 DIAGNOSIS — I10 Essential (primary) hypertension: Secondary | ICD-10-CM | POA: Insufficient documentation

## 2022-02-23 DIAGNOSIS — Z6837 Body mass index (BMI) 37.0-37.9, adult: Secondary | ICD-10-CM | POA: Insufficient documentation

## 2022-02-23 DIAGNOSIS — E669 Obesity, unspecified: Secondary | ICD-10-CM | POA: Insufficient documentation

## 2022-02-23 DIAGNOSIS — Z713 Dietary counseling and surveillance: Secondary | ICD-10-CM

## 2022-02-23 NOTE — Progress Notes (Signed)
TELEMEDICINE DOCUMENTATION:    Patient Location:  MyChart video visit from home address: [place] [address]    Patient/family aware of provider location:  yes  Patient/family consent for telemedicine:  yes  Examination observed and performed by:  Alisa Graff, [place]  02/23/2022  [id number]      I had the pleasure of meeting 66 y.o. [name] today for nutrition counseling.  The reason for today's visit was Hypertension and Weight Management.  No previous nutrition counseling.  Referred by Lillie Columbia, APRN.  Hx of vitamin D deficiency, last vitamin D normal at 43 on 02/12/22.  3 years went on Warfarin.  Has had both hips replaced so physically feels better than she has in a long time.  Would like to work on weight loss.  Lives with daughter and her husband.  Used to count macros.  Tracked food for 2 years.  Notes it was hard for her to get enough protein.  Family still cooks from this book.  Retired, spends days at home.  Recommended increasing protein and tracking grams per day.  Also briefly reviewed low sodium diet.        There were no vitals taken for this visit.    Vitals 02/12/2022   Ht in inches 65   Weight 227 lb 8.2 oz   BMI (Calculated) 37.94     IBW: 56.8 kg        %IBW: 181.6%        AdjBW: 68.4 kg    Estimated calorie needs: 1400 kcals (20  kcal/kg AdjBW)   Estimated protein needs: minimum of 70g      A 24 hour food recall/food frequency was performed and the results are as follows:    Breakfast - coffee with SF vanilla creamer with [place] cookies; scrambled eggs and sugar sweetened [place] yogurt or cheese  Snacks -   Lunch - wrap (carb balance tortilla with 2oz ham or [place] and 1oz provolone cheese and mustard); McDonald's 6 chicken nuggets with diet soda  Snacks - fun size candy bar; Halo orange; cheese stick  Dinner - pizza; meat, carb (pasta or rice), and a vegetable; 1/4 [place] baguette with ham, provolone, salami,  proscuitto with green beans  Snacks - 1x/week ice cream cone Drumstick  Snacks - popcorn and diet soda  Beverages - coffee with SF creamer, water, 1-2 diet soda    Restaurant/Take-out meal frequency - 4x/week    [name] typically engages in going to gym every other day for 60 minutes of physical activity.  Uses recumbent bike and treadmill.  Also uses abs and arm machines.  States she does not work up a sweat.  Heart rate will get up on the treadmill.      Recommendations:   Patient Instructions   Medical Nutrition Therapy    Recommendations    Increase protein to a minimum of 70g per day.  Aim for 85g per day.    A low sodium nutrition plan usually limits the sodium that you get from food and beverages to 1,500-2,000 milligrams (mg) per day. Salt is the main source of sodium. Read the nutrition label to find out how much sodium is in 1 serving of a food.      Select foods with 140 milligrams (mg) of sodium or less per serving.    Foods with more than 300 milligrams (mg) of  sodium per serving may not fit into a reduced-sodium meal plan.    Check the serving size on the label. If you eat more than 1 serving, you will get more sodium than the amount listed.    Tips for Cutting Back on Sodium:    Avoid processed foods.  Eat more fresh foods. Fresh fruits and vegetables are naturally low in sodium, as well as frozen vegetables and fruits that have no added juices or sauces. Fresh meats are lower in sodium than processed meats, such as bacon, sausage, and hotdogs.  Read the nutrition label or ask your butcher to help you find a fresh meat that is low in sodium.     Eat less salt - at the table and when cooking. A single teaspoon of table salt has 2,300 mg of sodium. Leave the salt out of recipes for pasta, casseroles, and soups. Ask your dietitian how to cook your favorite recipes without sodium     Be a smart shopper. Look for food packages that say "salt-free" or "sodium-free".  These items contain less  than 5 mg of sodium per serving. "Very low-sodium" products contain less than 35 mg of sodium per serving. "Low-sodium" products contain less than 140 mg of sodium per serving. Beware of "Unsalted" or "No Added Salt" products.  These items may still be high in sodium. Check the nutrition label.     Add flavors to your food without adding sodium. Try lemon juice, lime juice, fruit juice or vinegar.  Dry or fresh herbs add flavor. Try basil, bay leaf, dill, rosemary, parsley, sage, dry mustard, nutmeg, thyme, and paprika. Pepper, red pepper flakes, and cayenne pepper can add spice to your meals without adding sodium. Hot sauce contains sodium, but if you use just a drop or two, it will not add up to much. Buy a sodium-free seasoning blend or make your own at home.    Follow-up in 8 weeks.    Alisa Graff, RDLD  02/23/2022, 17:23      Nutrition Diagnosis: Obesity  Related to  undesirable food choices, lack of adequate physical activity  As evidenced by  BMI of 37.9 kg/m2.    Time spent with patient:  60 minutes    I will be happy to follow-up with this patient.     Return in 8 weeks (on 04/23/2022) for Video Visit.    Thank you for the referral.      Alisa Graff, RD LD  Outpatient Dietitian  [facility]

## 2022-02-23 NOTE — Patient Instructions (Addendum)
Medical Nutrition Therapy    Recommendations    Increase protein to a minimum of 70g per day.  Aim for 85g per day.    A low sodium nutrition plan usually limits the sodium that you get from food and beverages to 1,500-2,000 milligrams (mg) per day. Salt is the main source of sodium. Read the nutrition label to find out how much sodium is in 1 serving of a food.     Select foods with 140 milligrams (mg) of sodium or less per serving.   Foods with more than 300 milligrams (mg) of sodium per serving may not fit into a reduced-sodium meal plan.   Check the serving size on the label. If you eat more than 1 serving, you will get more sodium than the amount listed.    Tips for Cutting Back on Sodium:    Avoid processed foods.  Eat more fresh foods. Fresh fruits and vegetables are naturally low in sodium, as well as frozen vegetables and fruits that have no added juices or sauces. Fresh meats are lower in sodium than processed meats, such as bacon, sausage, and hotdogs.  Read the nutrition label or ask your butcher to help you find a fresh meat that is low in sodium.     Eat less salt - at the table and when cooking. A single teaspoon of table salt has 2,300 mg of sodium. Leave the salt out of recipes for pasta, casseroles, and soups. Ask your dietitian how to cook your favorite recipes without sodium     Be a smart shopper. Look for food packages that say "salt-free" or "sodium-free".  These items contain less than 5 mg of sodium per serving. "Very low-sodium" products contain less than 35 mg of sodium per serving. "Low-sodium" products contain less than 140 mg of sodium per serving. Beware of "Unsalted" or "No Added Salt" products.  These items may still be high in sodium. Check the nutrition label.     Add flavors to your food without adding sodium. Try lemon juice, lime juice, fruit juice or vinegar.  Dry or fresh herbs add flavor. Try basil, bay leaf, dill, rosemary, parsley, sage, dry mustard, nutmeg, thyme, and  paprika. Pepper, red pepper flakes, and cayenne pepper can add spice to your meals without adding sodium. Hot sauce contains sodium, but if you use just a drop or two, it will not add up to much. Buy a sodium-free seasoning blend or make your own at home.    Follow-up in 8 weeks.    Alisa Graff, RDLD  02/23/2022, 17:23

## 2022-02-24 ENCOUNTER — Encounter (HOSPITAL_COMMUNITY): Payer: Self-pay

## 2022-02-24 ENCOUNTER — Ambulatory Visit (HOSPITAL_COMMUNITY): Payer: Self-pay

## 2022-02-24 LAB — PT/INR: INR: 1.6

## 2022-02-24 NOTE — Progress Notes (Signed)
Anticoagulation Encounter Note:    Surgical AVR plan   Warfarin start date: 01-06-2019   HVI Staff: Raina   DX : On-X AVR   INR Range: 1.5 - 2.0  Tab strength: warfarin '2mg'$    Contact 1: [phone] cell   Contact 2:   Lab: [facility]/[place] [facility]   Comments: Tylenol regularly    [facility]VI anticoag SCN: Tammi Sou RN ext [phone]    INR: 1.6  Current Dose: 6 mg M/TH, 4 mg all other days  Clinical Outcomes     Positives:  INR In Range          Plan: 6 mg M/TH, 4 mg all other days; recheck in 2 weeks.  Confirmed dosing and testing with patient via MyChart message.    Next INR Date: 5.23.23    Erie Noe, RN SCN  [facility]  Ext. [phone]           For additional information, see dosing calendar below.    Warfarin Therapy Instructions   April 2023 Details    Sun [name] Thu Fri Sat           '1                 2               3               4               5               6               7               8                 9               10               11               12               13               14               15                 16               17               18               19               20               21               22                 23               24               25   '$ 1.8   4 mg   See details      26      4 mg         27  6 mg         28      4 mg         29      4 mg           30      4 mg                Date Details   04/25 Last INR check   INR: 1.8              Warfarin Therapy Instructions   May 2023 Details    Sun Mon Tue Wed Thu Fri Sat      1      6 mg         2      4 mg         3      4 mg         4      6 mg         5      4 mg         6      4 mg           7      4 mg         8      6 mg         9   1.6   4 mg   See details      10      4 mg         11      6 mg         12      4 mg         13      4 mg           14      4 mg         15      6 mg         16      4 mg         17      4 mg         18      6 mg         19      4 mg         20      4 mg           21      4 mg          22      6 mg         23      4 mg         '24               25               26               27                 28               29               30               '$ 31  Date Details   05/09 This INR check   INR: 1.6       Date of next INR:  03/10/2022

## 2022-03-05 ENCOUNTER — Encounter (INDEPENDENT_AMBULATORY_CARE_PROVIDER_SITE_OTHER): Payer: Self-pay

## 2022-03-05 ENCOUNTER — Encounter (HOSPITAL_BASED_OUTPATIENT_CLINIC_OR_DEPARTMENT_OTHER): Payer: Self-pay

## 2022-03-10 ENCOUNTER — Ambulatory Visit (HOSPITAL_BASED_OUTPATIENT_CLINIC_OR_DEPARTMENT_OTHER): Payer: Medicare Other

## 2022-03-10 ENCOUNTER — Inpatient Hospital Stay (HOSPITAL_BASED_OUTPATIENT_CLINIC_OR_DEPARTMENT_OTHER)
Admission: RE | Admit: 2022-03-10 | Discharge: 2022-03-10 | Disposition: A | Discharge status: Home / Self Care | Payer: Medicare Other | Source: Ambulatory Visit

## 2022-03-10 ENCOUNTER — Ambulatory Visit: Payer: Medicare Other | Attending: Orthopaedic Surgery | Admitting: Orthopaedic Surgery

## 2022-03-10 ENCOUNTER — Encounter (INDEPENDENT_AMBULATORY_CARE_PROVIDER_SITE_OTHER): Payer: Self-pay

## 2022-03-10 ENCOUNTER — Encounter (HOSPITAL_COMMUNITY): Payer: Self-pay

## 2022-03-10 ENCOUNTER — Inpatient Hospital Stay (HOSPITAL_BASED_OUTPATIENT_CLINIC_OR_DEPARTMENT_OTHER)
Admission: RE | Admit: 2022-03-10 | Discharge: 2022-03-10 | Disposition: A | Discharge status: Home / Self Care | Payer: Medicare Other | Source: Ambulatory Visit | Admitting: Radiology

## 2022-03-10 ENCOUNTER — Encounter (HOSPITAL_BASED_OUTPATIENT_CLINIC_OR_DEPARTMENT_OTHER): Payer: Self-pay

## 2022-03-10 ENCOUNTER — Ambulatory Visit (HOSPITAL_COMMUNITY): Payer: Self-pay

## 2022-03-10 ENCOUNTER — Other Ambulatory Visit: Payer: Self-pay

## 2022-03-10 VITALS — Temp 97.7°F | Ht 64.96 in | Wt 224.9 lb

## 2022-03-10 DIAGNOSIS — Z122 Encounter for screening for malignant neoplasm of respiratory organs: Secondary | ICD-10-CM

## 2022-03-10 DIAGNOSIS — Z87891 Personal history of nicotine dependence: Secondary | ICD-10-CM

## 2022-03-10 DIAGNOSIS — Z96641 Presence of right artificial hip joint: Secondary | ICD-10-CM

## 2022-03-10 LAB — PT/INR: INR: 1.9

## 2022-03-10 NOTE — Progress Notes (Signed)
Anticoagulation Encounter Note:    Surgical AVR plan   Warfarin start date: 01-06-2019   HVI Staff: Raina   DX : On-X AVR   INR Range: 1.5 - 2.0  Tab strength: warfarin '2mg'$    Contact 1: [phone] cell   Contact 2:   Lab: [facility]/[place] - [facility]   Comments: Tylenol regularly    [facility]VI anticoag SCN: Tammi Sou RN ext [phone]    INR: 1.9  Current Dose: 6 mg M/Th, 4 mg all other days  Clinical Outcomes     Positives:  INR In Range          Plan: 6 mg M/Th, 4 mg all other days; recheck in 2 weeks.  Confirmed dosing and testing with patient via MyChart message.    Next INR Date: 6.6.23    Erie Noe, RN SCN  [facility]  Ext. [phone]           For additional information, see dosing calendar below.    Warfarin Therapy Instructions   May 2023 Details    Sun [name] Thu Fri Sat      '1               2               3               4               5               6                 7               8               9   '$ 1.6   4 mg   See details      10      4 mg         11      6 mg         12      4 mg         13      4 mg           14      4 mg         15      6 mg         16      4 mg         17      4 mg         18      6 mg         19      4 mg         20      4 mg           21      4 mg         22      6 mg         23   1.9   4 mg   See details      24      4 mg         25      6 mg         26      4  mg         27      4 mg           28      4 mg         29      6 mg         30      4 mg         31      4 mg             Date Details   05/09 Last INR check   INR: 1.6      05/23 This INR check   INR: 1.9              Warfarin Therapy Instructions   June 2023 Details    Sun Mon Tue Wed Thu Fri Sat         1      6 mg         2      4 mg         3      4 mg           4      4 mg         5      6 mg         6      4 mg         '7               8               9               10                 11               12               13               14               15               16               17                 18                19               20               21               22               23               24                 25               26               27               28               '$ 29  30                 Date Details   No additional details    Date of next INR:  03/24/2022

## 2022-03-10 NOTE — Progress Notes (Signed)
[place]  [place] [address]  Operated by [facility]    SUBJECTIVE  [name] is a 66 y.o. female who returns to clinic today post operatively from a right total hip arthroplasty which was performed on 03/13/2021.  The patient is doing well post operatively. The patient reports no pain in her right hip.  The patient is staying active and has no complaints.     REVIEW OF SYSTEMS  General: negative for fevers or night sweats  Cardiovascular: negative for chest pain  Respiratory: negative for shortness of breath  Gastrointestinal: negative for digestive problems  Genitourinary: negative for urinary problems  Musculoskeletal: negative for hip pain, negative knee pain     All other systems reviewed and negative    PHYSICAL EXAMINATION   Temp 36.5 C (97.7 F)   Ht 1.65 m (5' 4.96")   Wt 102 kg (224 lb 13.9 oz)   BMI 37.47 kg/m     GEN:   NAD  EYES: Sclera white.  PULM:   Normal respiratory effort.    MS:  Right hip has smooth rotation, stable gait.  NEURO:   Alert and oriented to person, place and time.    PSYCHOSOCIAL:   Pleasant.  Normal affect.    WOUND/INCISION:   No open wounds present.     RADIOGRAPHS  Radiographs were performed today and reviewed at this visit.    X-Ray of right hip was interpreted in clinic to show well positioned and aligned implants. No evidence of loosening, stem subsidence, fracture or dislocation.     ASSESSMENT     ICD-10-CM    1. Status post total replacement of right hip  Z96.641 Right [place] is approximately 1 year s/p right total hip arthroplasty. she is doing well post operatively and does not have any complaints at the present time. The patient is satisfied with the operation.    The patient need antibiotic prophylaxis before future procedures. she has no restrictions other than to avoid high impact activities.     The patient was  told that she will have a follow up visit scheduled in the [facility] in 1 year at which time right hip x-rays will be taken. Orders were placed in the system. The patient was instructed to call with any new or concerning symptoms. All of the patient's questions were answered and she agreed to this plan.    A shared visit was performed with the co-signing physician.    Lanetta Inch Seifried, PA-C 03/10/2022, 11:10     I saw and examined the patient as part of a shared service with an APP.  I reviewed the midlevel's note.  I agree with the findings as documented in the midlevel's note.  Any exceptions/additions are edited/noted.  My substantive findings are:  stable exam, good function    Radiographs:  Images reviewed by me on the day of the encounter revealed stable implants    My findings are consistent with:  Assessment/Plan   1. Status post total replacement of right hip        Plan: f/u 1 year.    [name], MD  03/11/2022, 09:21    Cc:   Derek Jack, APRN,FNP-BC  [address]  [place] [address]

## 2022-03-11 ENCOUNTER — Other Ambulatory Visit (HOSPITAL_BASED_OUTPATIENT_CLINIC_OR_DEPARTMENT_OTHER): Payer: Self-pay | Admitting: Family

## 2022-03-11 DIAGNOSIS — Z122 Encounter for screening for malignant neoplasm of respiratory organs: Secondary | ICD-10-CM

## 2022-03-11 DIAGNOSIS — Z87891 Personal history of nicotine dependence: Secondary | ICD-10-CM

## 2022-03-11 DIAGNOSIS — I700 Atherosclerosis of aorta: Secondary | ICD-10-CM

## 2022-03-11 DIAGNOSIS — Z952 Presence of prosthetic heart valve: Secondary | ICD-10-CM

## 2022-03-11 NOTE — Result Encounter Note (Signed)
1. Hyperinflation and emphysematous change without consolidation to suggest pneumonia.  2. No suspicious pulmonary mass or nodule.  3. 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 Atherosclerotic disease.    Lung RADS Assessment Category: Benign appearance or behavior - 2    Will repeat lung screening in 1 year and notify patient accordingly.  Encourage smoking cessation and continued adherence to annual lung cancer screenings to reduce risk of developing lung cancer.  Recommend following up with PCP and/or specialist for other incidental findings as an additional workup or referral may be considered.  PCP and cardiologist notified.  Patient notified via mychart.    Thanks,     Reva Bores, MSN, APRN, FNP-C, [place]  Lung Cancer Screening & Tobacco Cessation  [phone]

## 2022-03-19 DIAGNOSIS — Z4789 Encounter for other orthopedic aftercare: Secondary | ICD-10-CM

## 2022-03-19 DIAGNOSIS — Z96641 Presence of right artificial hip joint: Secondary | ICD-10-CM

## 2022-03-24 ENCOUNTER — Encounter (HOSPITAL_COMMUNITY): Payer: Self-pay

## 2022-03-24 ENCOUNTER — Ambulatory Visit (HOSPITAL_COMMUNITY): Payer: Self-pay

## 2022-03-24 LAB — PT/INR: INR: 1.7

## 2022-03-24 NOTE — Progress Notes (Signed)
Anticoagulation Encounter Note:    Surgical AVR plan   Warfarin start date: 01-06-2019   HVI Staff: Posey Pronto (with fellow)  DX : On-X AVR   INR Range: 1.5 - 2.0  Tab strength: warfarin '2mg'$    Contact 1: [phone] cell   Contact 2:   Lab: [facility] - [facility]   Comments: Tylenol regularly    [facility]VI anticoag SCN: Tammi Sou RN ext [phone]    INR: 1.7  Current Dose: 6 mg MTh, 4 mg all other days  Clinical Outcomes     Positives:  INR In Range          Plan: 6 mg MTh, 4 mg all other days; recheck in 2 weeks.  Confirmed dosing and testing with patient via MyChart message.    Next INR Date: 6.20.23    Erie Noe, RN SCN  [facility]  Ext. [phone]        For additional information, see dosing calendar below.    Warfarin Therapy Instructions   May 2023 Details    Sun [name] Thu Fri Sat      '1               2               3               4               5               6                 7               8               9               10               11               12               13                 14               15               16               17               18               19               20                 21               22               23   '$ 1.9   4 mg   See details      24      4 mg         25      6 mg         26      4 mg         27      4 mg  28      4 mg         29      6 mg         30      4 mg         31      4 mg             Date Details   05/23 Last INR check   INR: 1.9              Warfarin Therapy Instructions   June 2023 Details    Sun Mon Tue Wed Thu Fri Sat         1      6 mg         2      4 mg         3      4 mg           4      4 mg         5      6 mg         6   1.7   4 mg   See details      7      4 mg         8      6 mg         9      4 mg         10      4 mg           11      4 mg         12      6 mg         13      4 mg         14      4 mg         15      6 mg         16      4 mg         17      4 mg           18      4 mg         19      6 mg         20      4 mg          '21               22               23               24                 25               26               27               28               29               '$ 30  Date Details   06/06 This INR check   INR: 1.7       Date of next INR:  04/07/2022

## 2022-04-02 ENCOUNTER — Encounter (HOSPITAL_BASED_OUTPATIENT_CLINIC_OR_DEPARTMENT_OTHER): Payer: Self-pay | Admitting: Family

## 2022-04-02 DIAGNOSIS — I10 Essential (primary) hypertension: Secondary | ICD-10-CM

## 2022-04-02 NOTE — Telephone Encounter (Addendum)
HCTZ pended to [facility].  Thanks,  Beryl Meager, RN    From: [name]  To: Derek Jack, APRN,FNP-BC  Sent: 04/02/2022 11:19 AM EDT  Subject: HCTZ    Could you send refill order to [facility] for the HCTZ script? Currently it's shown at [facility]. I prefer to use [facility] for maintenance drugs. They've contacted your office but no reply. They asked me to follow up with you about this request.     Let me know if you have questions. Thanks.

## 2022-04-06 MED ORDER — HYDROCHLOROTHIAZIDE 25 MG TABLET
25.0000 mg | ORAL_TABLET | Freq: Every day | ORAL | 4 refills | Status: DC
Start: 2022-04-06 — End: 2023-02-02

## 2022-04-07 ENCOUNTER — Encounter (HOSPITAL_COMMUNITY): Payer: Self-pay

## 2022-04-07 ENCOUNTER — Ambulatory Visit (HOSPITAL_COMMUNITY): Payer: Self-pay

## 2022-04-07 ENCOUNTER — Encounter (HOSPITAL_BASED_OUTPATIENT_CLINIC_OR_DEPARTMENT_OTHER): Payer: Self-pay

## 2022-04-07 LAB — PT/INR: INR: 1.7

## 2022-04-07 NOTE — Progress Notes (Signed)
Anticoagulation Encounter Note:    Surgical AVR plan   Warfarin start date: 01-06-2019   HVI Staff: Posey Pronto (with fellow)  DX : On-X AVR   INR Range: 1.5 - 2.0  Tab strength: warfarin '2mg'$    Contact 1: [phone] cell   Contact 2:   Lab: [facility]/[place] [facility]   Comments: Tylenol regularly    HVI anticoag SCN: Tammi Sou RN ext [phone]    INR: 1.7  Current Dose: 6 mg Mondays, 4 mg all other days  Clinical Outcomes     Positives:  INR In Range          Plan: 6 mg Mondays, 4 mg all other days; recheck in 2 weeks.  Confirmed dosing and testing with patient via MyChart message.    Next INR Date: 7.5.23    Erie Noe, RN SCN  [facility]  Ext. [phone]           For additional information, see dosing calendar below.    Warfarin Therapy Instructions   June 2023 Details    Sun [name] Thu Fri Sat         '1               2               3                 4               5               6   '$ 1.7   4 mg   See details      7      4 mg         8      6 mg         9      4 mg         10      4 mg           11      4 mg         12      6 mg         13      4 mg         14      4 mg         15      6 mg         16      4 mg         17      4 mg           18      4 mg         19      6 mg         20   1.7   4 mg   See details      21      4 mg         22      6 mg         23      4 mg         24      4 mg           25      4 mg  26      6 mg         27      4 mg         28      4 mg         29      6 mg         30      4 mg           Date Details   06/06 Last INR check   INR: 1.7      06/20 This INR check   INR: 1.7              Warfarin Therapy Instructions   July 2023 Details    Sun Mon Tue Wed Thu Fri Sat           1      4 mg           2      4 mg         3      6 mg         4      4 mg         5      4 mg         '6               7               8                 9               10               11               12               13               14               15                 16               17               18                19               20               21               22                 23               24               25               26               27               28               29                 '$ 30  31                     Date Details   No additional details    Date of next INR:  04/22/2022

## 2022-04-18 ENCOUNTER — Emergency Department (HOSPITAL_COMMUNITY): Payer: Medicare Other

## 2022-04-18 ENCOUNTER — Other Ambulatory Visit: Payer: Self-pay

## 2022-04-18 ENCOUNTER — Emergency Department
Admission: EM | Admit: 2022-04-18 | Discharge: 2022-04-18 | Disposition: A | Discharge status: Home / Self Care | Payer: Medicare Other | Attending: Emergency Medicine | Admitting: Emergency Medicine

## 2022-04-18 ENCOUNTER — Ambulatory Visit (INDEPENDENT_AMBULATORY_CARE_PROVIDER_SITE_OTHER): Payer: Medicare Other

## 2022-04-18 DIAGNOSIS — H538 Other visual disturbances: Secondary | ICD-10-CM

## 2022-04-18 DIAGNOSIS — H5462 Unqualified visual loss, left eye, normal vision right eye: Secondary | ICD-10-CM | POA: Insufficient documentation

## 2022-04-18 DIAGNOSIS — H5340 Unspecified visual field defects: Secondary | ICD-10-CM | POA: Insufficient documentation

## 2022-04-18 DIAGNOSIS — H43812 Vitreous degeneration, left eye: Secondary | ICD-10-CM | POA: Insufficient documentation

## 2022-04-18 DIAGNOSIS — H547 Unspecified visual loss: Secondary | ICD-10-CM

## 2022-04-18 DIAGNOSIS — Z87891 Personal history of nicotine dependence: Secondary | ICD-10-CM | POA: Insufficient documentation

## 2022-04-18 DIAGNOSIS — Z79899 Other long term (current) drug therapy: Secondary | ICD-10-CM | POA: Insufficient documentation

## 2022-04-18 DIAGNOSIS — H43392 Other vitreous opacities, left eye: Secondary | ICD-10-CM

## 2022-04-18 DIAGNOSIS — I10 Essential (primary) hypertension: Secondary | ICD-10-CM | POA: Insufficient documentation

## 2022-04-18 LAB — CBC WITH DIFF
BASOPHIL #: <0.1 10*3/uL (ref <=0.20)
BASOPHIL %: 1 %
EOSINOPHIL #: <0.1 10*3/uL (ref <=0.50)
EOSINOPHIL %: 2 %
HCT: 41.1 % (ref 34.8–46.0)
HGB: 13.9 g/dL (ref 11.5–16.0)
IMMATURE GRANULOCYTE #: <0.1 10*3/uL (ref <0.10)
IMMATURE GRANULOCYTE %: 0 % (ref 0–1)
LYMPHOCYTE #: 1.38 10*3/uL (ref 1.00–4.80)
LYMPHOCYTE %: 33 %
MCH: 30.1 pg (ref 26.0–32.0)
MCHC: 33.8 g/dL (ref 31.0–35.5)
MCV: 89 fL (ref 78.0–100.0)
MONOCYTE #: 0.29 10*3/uL (ref 0.20–1.10)
MONOCYTE %: 7 %
MPV: 8.7 fL (ref 8.7–12.5)
NEUTROPHIL #: 2.46 10*3/uL (ref 1.50–7.70)
NEUTROPHIL %: 57 %
PLATELETS: 230 10*3/uL (ref 150–400)
RBC: 4.62 10*6/uL (ref 3.85–5.22)
RDW-CV: 12.1 % (ref 11.5–15.5)
WBC: 4.3 10*3/uL (ref 3.7–11.0)

## 2022-04-18 NOTE — Consults (Signed)
[facility]   Ophthalmology Initial Consult Note      [name], [name], 66 y.o. female  [id number]  Date of Service:  04/18/2022       Requesting physician: Atlee Abide, MD     Information Obtained from: patient  Chief Complaint:  Floaters, blurry vision    HPI/Discussion:  [name] is a 66 y.o. female who presents with 1 day onset of blurry vision and floaters in her left eye.     States yesterday afternoon noticed suddenly new onset dark floaters in her vision. Later noticed more blurriness and haze, especially in the center part of her vision.     Denies recent trauma, flashes of light, vision loss, scotomas, headache, jaw pain, fever, fatigue, weight loss, arthralgias     Past Ocular Hx:  None   Ocular Meds:  None  Family ocular history: None    Past Medical History:   Diagnosis Date    Abdominal hernia 04/29/2020    hx of 3 repairs    Anemia     Aortic stenosis     Arthritis     Atrial fibrillation (CMS HCC) 04/29/2020    one episode  after AVR    Back problem 04/29/2020    LBP    Beta blocker prescribed for left ventricular systolic dysfunction     Blood in stool     positive cologuard.  Has not yet scheduled colonoscopy    Blood thinned due to long-term anticoagulant use 04/29/2020    coumadin    Cancer (CMS HCC)     skin cancer L lip.  Needs MOHS to be scheduled    Chronic pain 04/29/2020    back and right hip    Colon polyp     Dysrhythmias     Heart murmur 01/04/2019    AVR    High blood pressure     Motion sickness     Obesity     Rash 04/29/2020    scalp psoriasis and involves R ear    Valvular disease     aortic valve stenosis, following with CT surgery, echo 02/2016 and cath 03/2016    Wears glasses          Past Surgical History:   Procedure Laterality Date    HX AORTIC VALVE REPLACEMENT      HX CESAREAN SECTION  1984    HX HEART CATHETERIZATION      x2 with no intervention per pt report    HX HERNIA REPAIR  2004, 2006, 2008    x3    HX HIP REPLACEMENT Left  08/11/2016    HX HYSTERECTOMY  2003    HX OOPHORECTOMY      1 ovary           Prior to Admission Meds:  Medications Prior to Admission       Prescriptions    acetaminophen (TYLENOL) 500 mg Oral Tablet    Take 650 mg by mouth Every night Taking 2 daily    ascorbic acid, vitamin C, (VITAMIN C) 500 mg Oral Tablet    Take 0.5 Tablets (250 mg total) by mouth Once a day    aspirin 81 mg Oral Tablet, Chewable    Take 1 Tablet (81 mg total) by mouth Once a day    Bifidobacterium infantis (ALIGN ORAL)    Take by mouth    Cholecalciferol, Vitamin D3, 50 mcg (2,000 unit) Oral Capsule    Take 2,000 Units by mouth Once  a day     clobetasoL (TEMOVATE) 0.05 % Cream    Apply topically Twice daily    Fluocinolone-Shower Cap 0.01 % Oil    Apply to scalp 1-2 times weekly at night.    hydroCHLOROthiazide (HYDRODIURIL) 25 mg Oral Tablet    Take 1 Tablet (25 mg total) by mouth Once a day    loratadine (CLARITIN) 10 mg Oral Tablet    Take 1 Tablet (10 mg total) by mouth Once a day    metoprolol succinate (TOPROL-XL) 50 mg Oral Tablet Sustained Release 24 hr    Take 1 Tablet (50 mg total) by mouth Once a day    nystatin (NYSTOP) 100,000 unit/gram Powder    by Apply Topically route Twice per day as needed    triamcinolone acetonide (ARISTOCORT A) 0.1 % Cream    Apply topically Twice daily    warfarin (COUMADIN) 2 mg Oral Tablet    Take 3 Tablets (6 mg total) by mouth Every evening Or as directed by [place] [facility]    Patient taking differently:  Take 3 Tablets (6 mg total) by mouth Every evening Or as directed by Anticoagulant clinic            Inpatient Meds:  No current facility-administered medications for this encounter.      Allergies   Allergen Reactions    Iv Contrast Anaphylaxis    Oregano      Hives      Stadol [Butorphanol Tartrate] Mental Status Effect    Tomato Hives/ Urticaria     Social History     Tobacco Use    Smoking status: Former     Packs/day: 1.00     Years: 42.00     Pack years: 42.00     Types: Cigarettes     Start  date: 46     Quit date: 2014     Years since quitting: 9.5    Smokeless tobacco: Never   Substance Use Topics    Alcohol use: Yes     Comment: rarely     Family Medical History:       Problem Relation (Age of Onset)    Atrial fibrillation Sister    Heart Disease Mother    Hypertension (High Blood Pressure) Father    Lung Cancer Father, Brother    Macular Degen Maternal Aunt    Mitral valve prolapse Sister    Retinal Detachment Sister    Sudden Death no cause Mother              ROS: Other than ROS in the HPI, all other systems were negative.    Exam:  Temperature: 36.3 C (97.3 F)  Heart Rate: 62  BP (Non-Invasive): (!) 174/80  Respiratory Rate: 16  SpO2: 96 %    Visual Acuity:   Near with +3.00 trial lenses  ph  Distance    OD 20/20  NI      OS 20/40  NI        OD OS   Confr Vis Fields WNL WNL   EOM (Primary) WNL WNL   Pupils WNL WNL   Reaction, Direct WNL WNL                  Consensual WNL WNL                  RAPD No No        Adnexa  dermatochalasis dermatochalasis   Conjunctiva (palpebral) WNL WNL   Conjunctiva (bulbar) WNL WNL   Cornea WNL WNL   Anterior chamber WNL WNL   Iris WNL WNL   Lens:  2+ NSC 2+ NSC   IOP 13 14        Fundus - Dilated? Yes    Optic Disc - C:D Ratio 0.3 0.3                      Appearance  WNL WNL   Post Seg:  Retina                     Vitreous trace floaters many floaterse, weiss ring inferiorly, pigmented floater over macula                   Vessels  WNL WNL                   Macula WNL WNL                   Periphery no tears or detachments on scleral depressed exam no tears or detachments on scleral depressed exam     Neuro:  Oriented to person, place, and time:  Yes  Psychiatric:  Mood and Affect Appropriate:  Yes    Labs/imaging:   ED [place] LIMITED OCULAR    (Results Pending)         A/P:   66 y.o. who presents to ED with 1 day of new floaters and blurred vision in left eye. Vision mildly decreased, but otherwise unremarkable for tears or detachments. Multiple  floaters noted with pigmented floater over macula. Exam consistent with posterior vitreous detachment    Recommend  -No acute surgical intervention  -No holes, tears or breaks noted on SDE  -I reviewed the signs and symptoms of retinal detachment with the patient, including new flashes or increased frequency of flashes, floaters or a change in number or pattern of floaters, any decline in vision or change in visual field.  The patient understands and will contact us immediately or go to the nearest Emergency Department if these symptoms occur.  -continue to observe, close follow-up scheduled for repeat DFE at [place] the consultation.     Julieanne Cotton, MD 04/18/2022, 14:48    I did not see or examine the patient. I reviewed the resident's note.  I agree with the findings and plan of care as documented in the resident's note.  Any exceptions/additions are edited/noted.    Abundio Miu, MD 04/18/2022, 20:34

## 2022-04-18 NOTE — ED Attending Note (Signed)
ADDENDUM:    I performed a history and physical examination of the patient and discussed his/her management with the resident.  I reviewed the resident's note and agree with the documented findings and plan of care with the following additions / exceptions    Atlee Abide, MD 04/18/2022, 11:18    HPI:    66 y.o. female presents with haziness and floaters in the left eye.  Denies any recent trauma, denies any pain, describes as "cobwebs"      PE:  BP (!) 173/86   Pulse 68   Temp 36.3 C (97.3 F)   Resp 20   Ht 1.651 m ('5\' 5"'$ )   Wt 101 kg (223 lb 12.3 oz)   SpO2 95%   BMI 37.24 kg/m     PERRLA, no conjunctivitis or discharge.  20 patient will acute exam was 20/30 bilaterally      LABS/RADS:    Patient underwent bedside ultrasound of the eye in the emergency department.  Those results were reviewed by me and documented in the medical record.  There were no acute findings noted.    EKG:      MDM/DIFFERENTIAL DIAGNOSIS:    Due the patient's symptoms that she is exhibited as well as the negative findings on the ocular ultrasound the Ophthalmology service was consulted.  She underwent full evaluation by that service in the emergency department and was discharged home.    IMPRESSION:    Visual field deficits in the left eye

## 2022-04-18 NOTE — ED Resident Handoff Note (Signed)
Emergency Department  Resident Course Note    Patient Name: [name]  Age and Gender: 66 y.o. female  Date of Birth: 1955-12-22  Date of Service: 04/20/2022  PCP: Derek Jack, APRN,FNP-BC  Attending: Atlee Abide, MD    After a thorough discussion of the patient including presentation, ED course, and review of above information I have assumed care of [name] from Dr. Valora Piccolo at 15:07 04/18/2022    Triage Summary:   Vision Loss (c/o black floaters to left eye, started last evening. c/o mild headache)      HPI:  In brief, patient is a 66 y.o. White female presenting with vision loss   History of essential hypertension   Presenting with left visual changes in the eye   Noted some floaters, like cobweb   No trauma to the eye   Denies any fever, discharge from the eye, irritation    Pending Studies:  discharge    Plan:  Discharge    Course:  After assuming care of [name], ED course included the following:       Clinical Impression:     Clinical Impression   Posterior vitreous detachment of left eye (Primary)   Vision loss       Disposition: Discharged    Following the above history, physical exam, and studies, the patient was deemed stable and suitable for discharge and she will follow up with Ophthalmology, [facility].  Medication instructions were discussed with the patient/patient's family. Patient/patient's family was advised to return to the ED with any new, concerning or worsening symptoms and follow up as directed. The patient/patient's family verbalized understanding of all instructions and had no further questions or concerns.    Follow up:   Ophthalmology, [place]  [place] [address]  [phone]          Allergies:   Allergies   Allergen Reactions   . Iv Contrast Anaphylaxis   . Oregano      Hives     . Stadol [Butorphanol Tartrate] Mental Status Effect   . Tomato Hives/ Urticaria        Pertinent Imaging/Lab results:  Labs Ordered/Reviewed   CBC/DIFF    Narrative:     The following orders were created for panel order CBC/DIFF.  Procedure                               Abnormality         Status                     ---------                               -----------         ------                     CBC WITH BPZW[[id number]]                                    Final result                 Please view results for these tests on the individual orders.   CBC WITH DIFF            *  Parts of this patients chart were completed in a retrospective fashion due to simultaneous direct patient care activities in the Emergency Department.   *This note was partially generated using MModal Fluency Direct system, and there may be some incorrect words, spellings, and punctuation that were not noted in checking the note before saving.

## 2022-04-18 NOTE — ED Provider Notes (Signed)
[facility]  Resident Provider Note    Name: [name]  Age and Gender: 66 y.o. female  PCP: Derek Jack, APRN,FNP-BC  Attending: No att. providers found    Triage Note:   Vision Loss (c/o black floaters to left eye, started last evening. c/o mild headache)      Clinical Impression:     Clinical Impression   Posterior vitreous detachment of left eye (Primary)   Vision loss        Medical Decision Making   Course and MDM:  Patient seen and examined. Labs and imaging reviewed.  [name] is a 66 y.o. female presenting with nonpainful visual changes as described below.  Her blood pressure on presentation was acutely elevated at 173/86.  Otherwise in stable condition.  Differential diagnosis at this time includes but is not limited to retinal hemorrhage, retinal detachment, central retinal vein occlusion, and central retinal artery occlusion optic neuritis.  Due to lack of focal deficits unlikely for CVA/TIA.  Due to lack of erythema and discharge coming from the eye less likely due to infectious etiology such as conjunctivitis or uveitis.  CBC ordered for assessment of platelet count.  Bedside ultrasound in visual acuity ordered.  CBC resulted WNL with no concern of thrombocytopenia.  Bedside pocus showed no evidence of retinal hemorrhage or retinal detachment.  Ophthalmology consulted and confirmed posterior vitreous detachment.  Ophthalmology plans to see her as outpatient follow-up in 1-2 weeks.     Following the above history, physical exam, and studies, the patient was deemed stable and suitable for discharge. The patient was advised to return to the ED for any new or worsening symptoms. Discharge, medication and follow up instructions were discussed with the patient, who verbalized understanding. The patient is comfortable with the plan of care.     Disposition: Discharged    Follow up:   Ophthalmology, [place]  [place] [address]  [phone]        --------------------------------------------------------------------------------------------------------------------------------  History of Present Illness   HPI:  [name] is a 66 y.o. female with past medical history of essential hypertension who presents to the ED today for left visual changes.  Patient states that last evening she experienced floaters and haziness in her left eye that she describes as "cobwebs." This is not painful and is diffuse over all of her left eyes visual field more concentrated in the center.  Patient denies any trauma, diabetes mellitus or bleeding disorders.  She denies the sensation of a sheet were folding over her eye.  She is taking hydrochlorothiazide with blood pressures normally systolics of 097D and states that her presenting blood pressure in the ED was abnormal for her.  Otherwise she denies any focal lesions, weakness, malaise, fever, discharge from the eye.     Visual Acuity at this time with +3.00 trial lenses:  OD 20/20 NI   OS 20/40 NI        History Limitations: None      Review of Systems   Review of Systems:  Review of Systems   Constitutional: Negative for chills, fever and malaise/fatigue.   Eyes: Positive for blurred vision (Left eye). Negative for photophobia, pain, discharge and redness.   Respiratory: Negative for cough, shortness of breath and wheezing.    Cardiovascular: Negative for chest pain and palpitations.   Gastrointestinal: Negative for abdominal pain.   Skin: Negative for rash.   Neurological: Negative for dizziness  and headaches.   Endo/Heme/Allergies: Does not bruise/bleed easily.          Historical Data   Below information obtained from chart review and reviewed with patient:  Past Medical History:   Diagnosis Date   . Abdominal hernia 04/29/2020    hx of 3 repairs   . Anemia    . Aortic stenosis    . Arthritis    . Atrial fibrillation (CMS HCC) 04/29/2020    one episode  after AVR   . Back  problem 04/29/2020    LBP   . Beta blocker prescribed for left ventricular systolic dysfunction    . Blood in stool     positive cologuard.  Has not yet scheduled colonoscopy   . Blood thinned due to long-term anticoagulant use 04/29/2020    coumadin   . Cancer (CMS HCC)     skin cancer L lip.  Needs MOHS to be scheduled   . Chronic pain 04/29/2020    back and right hip   . Colon polyp    . Dysrhythmias    . Heart murmur 01/04/2019    AVR   . High blood pressure    . Motion sickness    . Obesity    . Rash 04/29/2020    scalp psoriasis and involves R ear   . Valvular disease     aortic valve stenosis, following with CT surgery, echo 02/2016 and cath 03/2016   . Wears glasses          Current Outpatient Medications   Medication Sig   . acetaminophen (TYLENOL) 500 mg Oral Tablet Take 650 mg by mouth Every night Taking 2 daily   . ascorbic acid, vitamin C, (VITAMIN C) 500 mg Oral Tablet Take 0.5 Tablets (250 mg total) by mouth Once a day   . aspirin 81 mg Oral Tablet, Chewable Take 1 Tablet (81 mg total) by mouth Once a day   . Bifidobacterium infantis (ALIGN ORAL) Take by mouth   . Cholecalciferol, Vitamin D3, 50 mcg (2,000 unit) Oral Capsule Take 2,000 Units by mouth Once a day    . clobetasoL (TEMOVATE) 0.05 % Cream Apply topically Twice daily   . Fluocinolone-Shower Cap 0.01 % Oil Apply to scalp 1-2 times weekly at night.   . hydroCHLOROthiazide (HYDRODIURIL) 25 mg Oral Tablet Take 1 Tablet (25 mg total) by mouth Once a day   . loratadine (CLARITIN) 10 mg Oral Tablet Take 1 Tablet (10 mg total) by mouth Once a day   . metoprolol succinate (TOPROL-XL) 50 mg Oral Tablet Sustained Release 24 hr Take 1 Tablet (50 mg total) by mouth Once a day   . nystatin (NYSTOP) 100,000 unit/gram Powder by Apply Topically route Twice per day as needed   . triamcinolone acetonide (ARISTOCORT A) 0.1 % Cream Apply topically Twice daily   . warfarin (COUMADIN) 2 mg Oral Tablet Take 3 Tablets (6 mg total) by mouth Every evening Or as  directed by [place] clinic (Patient taking differently: Take 3 Tablets (6 mg total) by mouth Every evening Or as directed by Anticoagulant clinic)     Allergies   Allergen Reactions   . Iv Contrast Anaphylaxis   . Oregano      Hives     . Stadol [Butorphanol Tartrate] Mental Status Effect   . Tomato Hives/ Urticaria     Past Surgical History:   Procedure Laterality Date   . HX AORTIC VALVE REPLACEMENT     . HX CESAREAN  SECTION  1984   . HX HEART CATHETERIZATION      x2 with no intervention per pt report   . HX HERNIA REPAIR  2004, 2006, 2008    x3   . HX HIP REPLACEMENT Left 08/11/2016   . HX HYSTERECTOMY  2003   . HX OOPHORECTOMY      1 ovary         Family History   Problem Relation Age of Onset   . Heart Disease Mother    . Sudden Death no cause Mother    . Hypertension (High Blood Pressure) Father    . Lung Cancer Father    . Mitral valve prolapse Sister         mitral valve issue   . Retinal Detachment Sister    . Atrial fibrillation Sister    . Lung Cancer Brother    . Macular Degen Maternal Aunt    . Blood Clots Neg Hx       No significant family hx other than noted above and no family hx of bleeding disorders or anesthesia problems.  Social History     Occupational History   . Occupation: photography    Tobacco Use   . Smoking status: Former     Packs/day: 1.00     Years: 42.00     Pack years: 42.00     Types: Cigarettes     Start date: 81     Quit date: 2014     Years since quitting: 9.5   . Smokeless tobacco: Never   Vaping Use   . Vaping Use: Never used   Substance and Sexual Activity   . Alcohol use: Yes     Comment: rarely   . Drug use: Never   . Sexual activity: Not Currently          Objective:  Nursing notes reviewed  ED Triage Vitals   BP (Non-Invasive) 04/18/22 1039 (!) 173/86   Heart Rate 04/18/22 1039 68   Respiratory Rate 04/18/22 1039 20   Temperature 04/18/22 1039 36.3 C (97.3 F)   SpO2 04/18/22 1039 95 %   Weight 04/18/22 1038 101 kg (223 lb 12.3 oz)   Height 04/18/22 1038 1.651 m (5'  5")     Filed Vitals:    04/18/22 1446 04/18/22 1500 04/18/22 1515 04/18/22 1530   BP: (!) 174/80 (!) 151/79  (!) 151/79   Pulse: 62   62   Resp: 16   16   Temp:       SpO2: 96% 94% 97% 94%       Physical Exam   Physical Exam  Physical Exam  Constitutional:       General: She is not in acute distress.     Appearance: Normal appearance. She is not ill-appearing or toxic-appearing.   HENT:      Head: Normocephalic and atraumatic.   Eyes:      General: No scleral icterus.        Right eye: No discharge.         Left eye: No discharge.      Extraocular Movements: Extraocular movements intact.      Right eye: No nystagmus.      Left eye: No nystagmus.      Conjunctiva/sclera: Conjunctivae normal.      Pupils: Pupils are equal, round, and reactive to light.   Cardiovascular:      Rate and Rhythm: Normal rate and regular rhythm.  Pulses: Normal pulses.      Heart sounds: Normal heart sounds. No murmur heard.    No friction rub. No gallop.   Pulmonary:      Effort: Pulmonary effort is normal.      Breath sounds: Normal breath sounds.   Abdominal:      General: Abdomen is flat.      Palpations: Abdomen is soft.   Musculoskeletal:      Cervical back: Normal range of motion.   Skin:     General: Skin is warm and dry.   Neurological:      Mental Status: She is alert.   Psychiatric:         Mood and Affect: Mood normal.         Behavior: Behavior normal.         Thought Content: Thought content normal.         Judgment: Judgment normal.            Labs:   Labs Ordered/Reviewed   CBC/DIFF    Narrative:     The following orders were created for panel order CBC/DIFF.  Procedure                               Abnormality         Status                     ---------                               -----------         ------                     CBC WITH WYSH[[id number]]                                    Final result                 Please view results for these tests on the individual orders.   CBC WITH DIFF       Imaging:  None  indicated  No orders to display        / Lindwood Qua, DO 04/18/2022, 11:17   PGY-1 Emergency Medicine  [facility]  Pager # - [place] [facility]     *Parts of this patients chart were completed in a retrospective fashion due to simultaneous direct patient care activities in the Emergency Department.   *This note was partially generated using MModal Fluency Direct system, and there may be some incorrect words, spellings, and punctuation that were not noted in checking the note before saving.

## 2022-04-18 NOTE — Discharge Instructions (Signed)
Follow-up with ophthalmology clinic in 1-2 weeks

## 2022-04-18 NOTE — ED Nurses Note (Signed)
Pt given d/c papers. Pt verbalized understanding of d/c papers. Pt's IV removed. Pt ambulated out of department.

## 2022-04-18 NOTE — ED Nurses Note (Signed)
ED Provider at bedside. 

## 2022-04-18 NOTE — ED Nurses Note (Signed)
Optho at bedside.

## 2022-04-22 ENCOUNTER — Ambulatory Visit (HOSPITAL_COMMUNITY): Payer: Self-pay

## 2022-04-22 ENCOUNTER — Encounter (HOSPITAL_COMMUNITY): Payer: Self-pay

## 2022-04-22 LAB — PT/INR: INR: 2.4

## 2022-04-23 ENCOUNTER — Ambulatory Visit (INDEPENDENT_AMBULATORY_CARE_PROVIDER_SITE_OTHER): Payer: Medicare Other | Admitting: Registered"

## 2022-04-23 NOTE — Progress Notes (Signed)
Anticoagulation Encounter Note:    Surgical AVR plan   Warfarin start date: 01-06-2019   [facility] Staff: Posey Pronto (with fellow)  DX : On-X AVR   INR Range: 1.5 - 2.0  Tab strength: warfarin '2mg'$    Contact 1: [phone] cell   Contact 2:   Lab: POC/[place] - [facility]   Comments: Tylenol regularly    [facility] anticoag SCN: Tammi Sou RN ext [phone]    INR: 2.4  Current Dose: 6 mg M/Th, 4 mg all other days  Clinical Outcomes     Positives:  INR Above Goal        Patient Findings     Positives:  Emergency department visit, Change in medications (takning extra 500 mg dose of Tylenol daily due to headache/eye problem- was evaluated in the ED earlier this week)        Plan: 4 mg today then 6 mg M/Th and 4 mg all other days; recheck in two weeks. Confirmed dosing and testing with patient via MyChart message.    Next INR Date: 7.19.23    Erie Noe, RN SCN  [place] [facility]  Ext. [phone]           For additional information, see dosing calendar below.    Warfarin Therapy Instructions   June 2023 Details    Sun [name] Thu Fri Sat         '1               2               3                 4               5               6               7               8               9               10                 11               12               13               14               15               16               17                 18               19               20   '$ 1.7   4 mg   See details      21      4 mg         22      6 mg         23      4 mg         24  4 mg           25      4 mg         26      6 mg         27      4 mg         28      4 mg         29      6 mg         30      4 mg           Date Details   06/20 Last INR check   INR: 1.7              Warfarin Therapy Instructions   July 2023 Details    Sun Mon Tue Wed Thu Fri Sat           1      4 mg           2      4 mg         3      6 mg         4      4 mg         5   2.4   4 mg   See details      6      4 mg         7      4 mg         8      4 mg           9      4 mg          10      6 mg         11      4 mg         12      4 mg         13      6 mg         14      4 mg         15      4 mg           16      4 mg         17      6 mg         18      4 mg         19      4 mg         '20               21               22                 23               24               25               26               27               '$ 28  $'29                 30               31                     'S$ Date Details   07/05 This INR check   INR: 2.4       Date of next INR:  05/06/2022

## 2022-04-27 ENCOUNTER — Ambulatory Visit: Payer: Medicare Other | Attending: Ophthalmology | Admitting: Ophthalmology

## 2022-04-27 ENCOUNTER — Inpatient Hospital Stay (HOSPITAL_BASED_OUTPATIENT_CLINIC_OR_DEPARTMENT_OTHER)
Admission: RE | Admit: 2022-04-27 | Discharge: 2022-04-27 | Disposition: A | Discharge status: Home / Self Care | Payer: Medicare Other | Source: Ambulatory Visit

## 2022-04-27 ENCOUNTER — Other Ambulatory Visit: Payer: Self-pay

## 2022-04-27 DIAGNOSIS — H43399 Other vitreous opacities, unspecified eye: Secondary | ICD-10-CM | POA: Insufficient documentation

## 2022-04-27 DIAGNOSIS — H43819 Vitreous degeneration, unspecified eye: Secondary | ICD-10-CM | POA: Insufficient documentation

## 2022-04-27 DIAGNOSIS — H43812 Vitreous degeneration, left eye: Secondary | ICD-10-CM | POA: Insufficient documentation

## 2022-04-27 DIAGNOSIS — H43393 Other vitreous opacities, bilateral: Secondary | ICD-10-CM | POA: Insufficient documentation

## 2022-04-27 NOTE — Progress Notes (Addendum)
[facility]  [place] [address]  Operated by [facility]         Patient Name: [name]  MRN#: [id number]  [place]: August 12, 1956    Date of Service: 04/27/2022    Chief Complaint    Floaters         [name] is a 66 y.o. female who presents today for evaluation/consultation of:  HPI    Pt here for eval of new floaters that occurred around 04-18-2022  Went to ED and they referred her back to Dr. Laurance Flatten (saw retina while in ED)   Notes BP was up while in ED as well and would like that checked while here.    Pt states that floaters have been intermittent. Coming and going, but do not seem to be worsening.   C/o dull HA since this started, cannot seem to get rid of it. Has been using Tylenol and that is barely helping.   Denies other changes or concerns.   Last edited by Remi Deter, [employer] [place] on 04/27/2022  1:07 PM.        ROS    Positive for: Eyes  Last edited by Remi Deter, Vincent on 04/27/2022  1:06 PM.         All other systems Negative    Remi Deter, COA  04/27/2022, 13:06      Base Eye Exam     Visual Acuity (Snellen - Linear)       Right Left    Dist cc 20/25 20/25    Correction: Glasses          Tonometry ([facility], 1:12 PM)       Right Left    Pressure 13 13          Pupils       Pupils APD    Right PERRL None    Left PERRL None          Visual Fields       Right Left    Restrictions Partial outer superior temporal, superior nasal deficiencies Partial outer superior temporal, superior nasal deficiencies          Extraocular Movement       Right Left     Full Full          Neuro/Psych     Oriented x3: Yes    Mood/Affect: Normal          Dilation     Both eyes: 1.0% Mydriacyl, 2.5% Phenylephrine @ 1:13 PM            Refraction     Wearing Rx       Sphere Cylinder Axis Add    Right -4.25 +1.75 019 +2.50    Left -4.00 +1.25 173 +2.50    Type: PAL                MD Addition to HPI: 1 week F/U from ED visit for new onset floaters OS, unchnaged           ENCOUNTER DIAGNOSES     ICD-10-CM   1. Floaters in visual field, bilateral  H43.393   2. Posterior vitreous detachment, unspecified laterality  H43.819   3. Vitreous floaters, unspecified laterality  H43.399   4. PVD (posterior vitreous detachment), left eye  H43.812     Orders Placed This Encounter   Procedures   . OPH FUNDUS PHOTOS       Ophthalmic Plan of  Care:    Optos today no RD, tear etc  DFE Ok  +drusen    Follow up:    I have asked Londen Lorge to follow up 6 weeks          I have seen and examined the above patient. I discussed the above diagnoses listed in the assessment and the above ophthalmic plan of care with the patient and patient's family. All questions were answered. I reviewed and, when necessary, made changes to the technician/resident note, documented ophthalmology exam, chief complaint, history of present illness, allergies, review of systems, past medical, past surgical, family and social history. I personally reviewed and interpreted all testing and/or imaging performed at this visit and agree with the resident's or fellow's interpretation. Any exceptions/additions are edited/noted in the relevant encounter fields.      Gwendalyn Ege, MD  04/27/2022, 13:45

## 2022-04-28 ENCOUNTER — Encounter (INDEPENDENT_AMBULATORY_CARE_PROVIDER_SITE_OTHER): Payer: Self-pay | Admitting: Ophthalmology

## 2022-04-28 NOTE — ED Attending Handoff Note (Signed)
Discharge in progress at time of shift change

## 2022-04-29 ENCOUNTER — Encounter (HOSPITAL_COMMUNITY): Payer: Self-pay

## 2022-04-29 ENCOUNTER — Ambulatory Visit (HOSPITAL_COMMUNITY): Payer: Self-pay

## 2022-04-29 LAB — PT/INR: INR: 2.3

## 2022-04-29 NOTE — Progress Notes (Signed)
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 recheck in one week. Confirmed dosing and testing with patient via MyChart message.    Next INR Date: 7.19.23    Tressia Miners, RN  Anticoag Specialty Care Nurse  Ext [phone]      For additional information, see dosing calendar below.    Warfarin Therapy Instructions   July 2023 Details    Sun Molli Knock Tue Wed Thu Fri Sat           '1                 2               3               4               5   '$ 2.4   4 mg   See details      6      4 mg         7      4 mg         8      4 mg           9      4 mg         10      6 mg         11      4 mg         12   2.3   4 mg   See details      13      4 mg         14      4 mg         15      4 mg           16      4 mg         17      6 mg         18      4 mg         19      4 mg         '20               21               22                 23               '$ 24  $'25               26               27               28               29                 30               31                     'a$ Date Details   07/05 Last INR check   INR: 2.4      07/12 This INR check   INR: 2.3       Date of next INR:  05/06/2022

## 2022-05-05 ENCOUNTER — Ambulatory Visit (INDEPENDENT_AMBULATORY_CARE_PROVIDER_SITE_OTHER): Payer: Self-pay | Admitting: Ophthalmology

## 2022-05-06 ENCOUNTER — Encounter (HOSPITAL_COMMUNITY): Payer: Self-pay

## 2022-05-06 ENCOUNTER — Ambulatory Visit (HOSPITAL_COMMUNITY): Payer: Self-pay

## 2022-05-06 LAB — PT/INR: INR: 2

## 2022-05-06 NOTE — Progress Notes (Signed)
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 Confirmed dosing and testing with patient via MyChart message.      Next INR Date: 7.26.23    Erie Noe, RN SCN  [place] [facility]  Ext. [phone]              For additional information, see dosing calendar below.    Warfarin Therapy Instructions   July 2023 Details    Sun [name] Thu Fri Sat           '1                 2               3               4               5               6               7               8                 9               10               11               12   '$ 2.3   4 mg   See details      13      4 mg         14      4 mg         15      4 mg           16      4 mg         17      6 mg         18      4 mg         19   2.0   4 mg   See details      20      4 mg         21      4 mg         22      4 mg           23      4 mg         24      4 mg         25      4 mg         26  4 mg         27      6 mg         28      4 mg         29      4 mg           30      4 mg         31      6 mg               Date Details   07/12 Last INR check   INR: 2.3      07/19 This INR check   INR: 2.0              Warfarin Therapy Instructions   August 2023 Details    Sun Mon Tue Wed Thu Fri Sat       1      4 mg         2      4 mg         '3               4               5                 6               7               8               9               10               11               12                 13               14               15               16               17               18               19                  20               21               22               23               24               25               26                 27               28               '$ 29  30               31                  Date Details   No additional details    Date of next INR:  05/20/2022

## 2022-05-13 ENCOUNTER — Encounter (HOSPITAL_COMMUNITY): Payer: Self-pay

## 2022-05-13 ENCOUNTER — Ambulatory Visit (HOSPITAL_COMMUNITY): Payer: Self-pay

## 2022-05-13 LAB — PT/INR: INR: 1.5

## 2022-05-13 NOTE — Progress Notes (Signed)
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  Clinical Outcomes       Positives:  INR In Range            Plan: 6 mg M/Th, 4 mg all other days; recheck in 2 weeks. Confirmed dosing and testing with patient via MyChart message.    Next INR Date: 8.9.23    Erie Noe, RN SCN  [place] [facility]  Ext. [phone]         For additional information, see dosing calendar below.      Warfarin Therapy Instructions   July 2023 Details    Sun Molli Knock Tue Wed Thu Fri Sat           '1                 2               3               4               5               6               7               8                 9               10               11               12               13               14               15                 16               17               18               19   '$ 2.0   4 mg   See details      20      4 mg         21      4 mg         22      4 mg           23      4 mg         24      4 mg         25      4 mg  26   1.5   4 mg   See details      27      6 mg         28      4 mg         29      4 mg           30      4 mg         31      6 mg               Date Details   07/19 Last INR check     INR: 2.0      07/26 This INR check     INR: 1.5                Warfarin Therapy Instructions   August 2023 Details    Sun Mon Tue Wed Thu Fri Sat       1      4 mg         2      4 mg         3      6 mg         4      4 mg         5      4 mg           6      4 mg         7      6 mg         8      4 mg         9      4 mg         '10               11               12                 13               14                15               16               17               18               19                 20               21               22               23               24               25               26                 27               '$ 28  29               30               31                   Date Details   No additional details    Date of next INR:  05/27/2022

## 2022-05-27 ENCOUNTER — Ambulatory Visit (HOSPITAL_COMMUNITY): Payer: Self-pay

## 2022-05-27 ENCOUNTER — Encounter (HOSPITAL_COMMUNITY): Payer: Self-pay

## 2022-05-27 LAB — PT/INR: INR: 2.1

## 2022-05-27 NOTE — Progress Notes (Signed)
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 Confirmed dosing and testing with patient via MyChart message.    Next INR Date: 8.23.23    Erie Noe, RN SCN  [place] [facility]  Ext. [phone]         For additional information, see dosing calendar below.      Warfarin Therapy Instructions   July 2023 Details    Sun Molli Knock Tue Wed Thu Fri Sat           '1                 2               3               4               5               6               7               8                 9               10               11               12               13               14               15                 16               17               18               19               20               21               22                 23               24               25               26   '$ 1.5   4 mg   See details  27      6 mg         28      4 mg         29      4 mg           30      4 mg         31      6 mg               Date Details   07/26 Last INR check     INR: 1.5                Warfarin Therapy Instructions   August 2023 Details    Sun Mon Tue Wed Thu Fri Sat       1      4 mg         2      4 mg         3      6 mg         4      4 mg         5      4 mg           6      4 mg         7      6 mg         8      4 mg         9   2.1   4 mg   See details      10      4 mg         11      4 mg         12      4 mg           13      4 mg         14      6 mg         15      4 mg         16      4 mg         17      4 mg         18      4 mg         19      4 mg            20      4 mg         21      6 mg         22      4 mg         23      4 mg         '24               25               26                 27               28               '$ 29  30               31                  Date Details   08/09 This INR check     INR: 2.1       Date of next INR:  06/10/2022

## 2022-06-01 ENCOUNTER — Telehealth: Payer: Medicare Other

## 2022-06-01 DIAGNOSIS — L0291 Cutaneous abscess, unspecified: Secondary | ICD-10-CM

## 2022-06-01 MED ORDER — AMOXICILLIN 875 MG-POTASSIUM CLAVULANATE 125 MG TABLET
1.0000 | ORAL_TABLET | Freq: Two times a day (BID) | ORAL | 0 refills | Status: AC
Start: 2022-06-01 — End: 2022-06-08

## 2022-06-01 NOTE — Progress Notes (Signed)
[facility], [facility]  [place] [id number]   [facility]  Video Visit     Name:  [name] MRN: [id number]   Date:    06/01/2022 Age:  66 y.o.                                      Patient's location: [place] [address]   Patient/family aware of provider location: Yes  Patient/family consent for video visit: Yes  Interview and observation performed by: Gertie Gowda, APRN,NP-C      Attending: Dr. Harlow Mares    Chief Complaint: Facial Pain    History of Present Illness:  [name] is a 66 y.o. female who reports right lower jaw pain since Saturday  She associates localized redness,and swelling to the area.  Patient denies fevers, chills, or dental pain.          Past Medical History:  She has a past medical history of Abdominal hernia (04/29/2020), Anemia, Aortic stenosis, Arthritis, Atrial fibrillation (CMS HCC) (04/29/2020), Back problem (04/29/2020), Beta blocker prescribed for left ventricular systolic dysfunction, Blood in stool, Blood thinned due to long-term anticoagulant use (04/29/2020), Cancer (CMS [place]), Chronic pain (04/29/2020), Colon polyp, Dysrhythmias, Heart murmur (01/04/2019), High blood pressure, Motion sickness, Obesity, Rash (04/29/2020), Valvular disease, and Wears glasses.    She has no past medical history of Angina pectoris (CMS HCC), Anxiety, Asthma, Breast CA (CMS HCC), Cataract, Cervical cancer (CMS HCC), Chronic obstructive airway disease (CMS HCC), Claustrophobia, Clotting disorder (CMS HCC), Colon cancer (CMS [place]), Convulsions (CMS HCC), Coronary artery disease, Cough, CVA (cerebrovascular accident) (CMS [place]), Deep vein thrombosis (DVT) (CMS HCC), Depression, Difficult intubation, Difficulty waking, Disorder of liver, Dyspnea on exertion, Endometrial cancer (CMS HCC), Esophageal reflux, Glaucoma, H/O hearing loss, H/O urinary tract infection, Hard to intubate, Headache, Heartburn, Hepatitis A  virus infection, HH (hiatus hernia), History of kidney disease, coronary artery bypass graft, transfusion, Hyperlipidemia, Hyperthyroidism, Hypothyroid, Jaundice, Kidney stones, Malignant hyperthermia, MI (myocardial infarction) (CMS HCC), Migraine, Neck problem, Orthopnea, Ovarian cancer (CMS HCC), Palpitations, Panic attack, Peripheral edema, Personal history of venous thrombosis and embolism, Pneumonia, PONV (postoperative nausea and vomiting), Problems with swallowing, Pseudocholinesterase deficiency, Pulmonary embolism (CMS HCC), Rheumatic fever, Shortness of breath, Sleep apnea, Speech and language deficits, Thyroid disorder, TIA (transient ischemic attack), Treatment, Tuberculosis colliquativa, Type 2 diabetes mellitus (CMS [place]), Type I diabetes mellitus (CMS [place]), Unintentional weight loss, Upper respiratory infection, Uterine cancer (CMS [place]), Viral hepatitis B, or Viral hepatitis C.  She has a past surgical history that includes hx hysterectomy (2003); hx cesarean section (1984); hx hernia repair (2004, 2006, 2008); hx hip replacement (Left, 08/11/2016); hx heart catheterization; hx oophorectomy; and hx aortic valve replacement.  She does not have any pertinent problems on file.    Allergies:  Allergies   Allergen Reactions    Iv Contrast Anaphylaxis    Oregano      Hives      Stadol [Butorphanol Tartrate] Mental Status Effect    Tomato Hives/ Urticaria       Medications:    acetaminophen    amoxicillin-pot clavulanate    ascorbic acid (vitamin C)    aspirin    Bifidobacterium infantis (ALIGN ORAL)    Cholecalciferol (Vitamin D3)    clobetasoL    fluocinolone scalp oil and shower cap    hydroCHLOROthiazide    loratadine  metoprolol succinate    nystatin    triamcinolone acetonide    warfarin     Review of Systems:  General: no fever, no myalgias and no fatigue  ENT:  no sore throat, no otalgia, and no runny nose  Eyes:  no pain, no discharge, no photophobia, no redness and no visual change  Pulmonary:    no dry cough, no productive cough, no wheezing and no SOB  Cardiovascular:  no chest pain and no palpitations  Gastrointestinal:  no nausea, no vomiting, no diarrhea and no abdominal pain  Genitourinary:  no dysuria, no frequency, no urgency and no flank pain  Musculoskeletal:  No joint pain  Neurologic:  no paresthesias, no weakness, no headache and no seizures  Skin:  no rash, no itching  Heme/Lymph:  no swollen lymph glands              Observational Exam:    General:  Well appearing and No acute distress  Head:  Normocephalic and Atraumatic  Eyes:  Normal lids/lashes, No conjunctival injection  Neck:  supple,  Pulmonary:  normal effort and rate  Skin:  no visible rash  Psychiatric:  Appropriate affect and behavior and Normal speech  Neurologic:   EOMI, No nystagmus, normal coordination       Differential Diagnosis:  Dental abscess versus facial abscess versus cellulitis    Assessment:   Assessment/Plan   1. Abscess        Plan:    Orders Placed This Encounter    amoxicillin-pot clavulanate (AUGMENTIN) 875-125 mg Oral Tablet          *Favor facial abscess at this time, will start treatment with Augmentin.  Patient to follow-up with dentist if no improvement in 2-3 days.  Recommend patient follow-up with PCP in 1 week.    Plan was discussed and patient/parent/guardian verbalized understanding.  If symptoms are not improving the patient should be seen in person by their PCP or at Urgent Care for further evaluation. If symptoms are worsening or emergent present to Emergency Department for higher level of evaluation and care.      Gertie Gowda, APRN,NP-C 06/01/2022, 10:19

## 2022-06-04 ENCOUNTER — Ambulatory Visit (HOSPITAL_BASED_OUTPATIENT_CLINIC_OR_DEPARTMENT_OTHER): Payer: Self-pay | Admitting: Family

## 2022-06-04 ENCOUNTER — Ambulatory Visit (HOSPITAL_COMMUNITY): Payer: Self-pay

## 2022-06-04 ENCOUNTER — Encounter (HOSPITAL_COMMUNITY): Payer: Self-pay

## 2022-06-04 LAB — PT/INR: INR: 1.5

## 2022-06-04 NOTE — Telephone Encounter (Signed)
Patient call transferred from the call center for allergic reaction of rash to antibiotic.  States she is on Augmentin for an abscess in her mouth on the right side between her chin and jaw. States there was a bump in there, which is smaller now and it was very swollen and that is improved. Denies drainage or fever.  States she now has a whitish/red raised rash of bumps on her back and shoulders. States she has a red raised rash on her elbow and forearm. Both rashes are itchy. Neither are hives or welts.   Denies difficulty breathing or shortness of breath, lip/tongue/facial/neck swelling or difficulty swallowing.  States she already takes Claritin everyday.  Advised to take Benadryl if needed 25-50 mg every 6 hours prn.  She does have on hand, but is watching her Grand children and doesn't want to be drowsy with them if not needed.  Has taken 1 dose of Augmentin on Mon, 2 on Tues and Wed, and 1 dose today.  She is wondering if she should stop taking this and get an alternative.  She uses [facility].  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 She has eight 500 mg Amoxicillin tablets on hand from Dr. Sharlene Motts for taking prior to dental procedures and she has never had an allergy to Amoxicillin that she knows of.  She did use some cream with epsom salts on the rash and it did somewhat help the itching.  Please advise.  Thanks,  Beryl Meager, RN

## 2022-06-04 NOTE — Progress Notes (Signed)
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 Confirmed dosing and testing with patient via MyChart message.  Next INR Date: 8.21.23    Erie Noe, RN SCN  [place] [facility]  Ext. [phone]        For additional information, see dosing calendar below.      Warfarin Therapy Instructions   August 2023 Details    Sun Molli Knock Tue Wed Thu Fri Sat       '1               2               3               4               5                 6               7               8               9   '$ 2.1   4 mg   See details      10      4 mg         11      4 mg         12      4 mg           13      4 mg         14      6 mg         15      4 mg         16      4 mg         17   1.5   6 mg   See details      18      4 mg         19      4 mg           20      4 mg         21      6 mg         '22               23               24               '$ 25  $'26                 27               28               29               30               31                  'E$ Date Details   08/09 Last INR check     INR: 2.1      08/17 This INR check     INR: 1.5       Date of next INR:  06/08/2022

## 2022-06-05 ENCOUNTER — Encounter (HOSPITAL_BASED_OUTPATIENT_CLINIC_OR_DEPARTMENT_OTHER): Payer: Self-pay | Admitting: Orthopaedic Surgery

## 2022-06-05 ENCOUNTER — Telehealth (HOSPITAL_BASED_OUTPATIENT_CLINIC_OR_DEPARTMENT_OTHER): Payer: Self-pay | Admitting: Surgical

## 2022-06-05 ENCOUNTER — Encounter (HOSPITAL_BASED_OUTPATIENT_CLINIC_OR_DEPARTMENT_OTHER): Payer: Self-pay | Admitting: Family

## 2022-06-05 NOTE — Telephone Encounter (Addendum)
Spoke with Raquel Sarna.  She states she would like patient to reach out to Dr. Sharlene Motts and the ID providers in Ortho to determine what antibiotic they recommend.  She is considering Omnicef or Doxycycline, but unsure if Doxycycline will cover oral abscess.   Concern also with mechanical heart valve.    Patient called.   She will call Dr. Wandra Scot office and return call to me. Informed her we will have resolution by 4:30 pm today before I leave the office.  She wants to make sure we are aware she has Amoxicillin, eight 500 mg tablets on hand.  Informed her this is prescribed to prevent infection when having dental procedures, not for current problem.  She states understanding.  Beryl Meager, RN        Regarding: Lillie Columbia, NP, requests return call- allergic reaction  ----- Message from Marzetta Board sent at 06/05/2022  8:36 AM EDT -----  Derek Jack, APRN,FNP-BC    The patient called- she is requesting a return call from the nurse.  She spoke with her yesterday about an allergic reaction of rash due to antibiotics.    Thank you,  Marzetta Board

## 2022-06-05 NOTE — Telephone Encounter (Signed)
[name] pt     Pt called. She had a mouth abscess last week. She was prescribed Augmentin by urgent care. Developed a rash last night. PCP wants to prescribe something else but she's unsure what.   They told her to call our office to find out what Dr [name] thinks she should be on.   There are notes in the chart.   Please call her to discuss.     Thanks       Spoke with patient. She has an abscess to oral mucosa. She has tried listerine, salt water mouthwash, and was prescribed Augmentin after an Urgent Care virtual visit. She developed a rash with this, and discontinued this med today. I recommended an urgent dental visit. With her history of heart valve and prosthetic joint, she needs an in person oral evaluation. She has tolerated amoxicillin in the past for our dental prophylaxis, and I recommended she begin taking this today while she attempts to get a dental visit. She will send me a message to let me know when she gets an appt.     Domingo Sep, PA-C

## 2022-06-08 ENCOUNTER — Encounter (HOSPITAL_COMMUNITY): Payer: Self-pay

## 2022-06-08 ENCOUNTER — Ambulatory Visit (HOSPITAL_COMMUNITY): Payer: Self-pay

## 2022-06-08 LAB — PT/INR: INR: 1.6

## 2022-06-08 NOTE — Progress Notes (Signed)
Anticoagulation Encounter Note:    Surgical AVR plan   Warfarin start date: 01-06-2019   HVI Staff: Posey Pronto (with fellow)  DX : On-X AVR   INR Range: 1.5 - 2.0  Tab strength: warfarin '2mg'$    Contact 1: [phone] cell   Contact 2:   Lab: POC/[place] - [facility]   Comments: Tylenol regularly    HVI anticoag SCN: Tammi Sou RN ext [phone]    INR: 1.6  Current Dose: 6 mg Th, 4 mg F/Sa/Su  Clinical Outcomes       Positives:  INR In Range          Patient Findings       Positives:  Upcoming dental procedure, Change in medications (stopped Augmentin Thursday d/t reaction; started amoxicillin)          Plan: 6 mg M/TH, 4 mg Tu/W; recheck on Friday. Confirmed dosing and testing with patient via MyChart message.    Next INR Date: 8.25.23      Erie Noe, RN SCN  [facility]  Ext. [phone]        For additional information, see dosing calendar below.      Warfarin Therapy Instructions   August 2023 Details    Sun Molli Knock Tue Wed Thu Fri Sat       '1               2               3               4               5                 6               7               8               9               10               11               12                 13               14               15               16               17   '$ 1.5   6 mg   See details      18      4 mg         19      4 mg           20      4 mg         21   1.6   6 mg   See details      22      4 mg         23      4 mg         24      6 mg  25      4 mg         '26                 27               28               29               30               31                  '$ Date Details   08/17 Last INR check     INR: 1.5      08/21 This INR check     INR: 1.6       Date of next INR:  06/12/2022

## 2022-06-10 ENCOUNTER — Ambulatory Visit (INDEPENDENT_AMBULATORY_CARE_PROVIDER_SITE_OTHER): Payer: Self-pay | Admitting: Ophthalmology

## 2022-06-10 ENCOUNTER — Ambulatory Visit (HOSPITAL_BASED_OUTPATIENT_CLINIC_OR_DEPARTMENT_OTHER): Payer: Self-pay | Admitting: Student in an Organized Health Care Education/Training Program

## 2022-06-12 ENCOUNTER — Encounter (HOSPITAL_COMMUNITY): Payer: Self-pay

## 2022-06-12 ENCOUNTER — Ambulatory Visit (HOSPITAL_COMMUNITY): Payer: Self-pay

## 2022-06-12 LAB — PT/INR: INR: 1.8

## 2022-06-12 NOTE — Progress Notes (Deleted)
Anticoagulation Encounter Note:    Surgical AVR plan   Warfarin start date: 01-06-2019   HVI Staff: Posey Pronto (with fellow)  DX : On-X AVR   INR Range: 1.5 - 2.0  Tab strength: warfarin '2mg'$    Contact 1: [phone] cell   Contact 2:   Lab: POC/[place] - [facility]   Comments: Tylenol regularly    HVI anticoag SCN: Tammi Sou RN ext [phone]    INR: 1.8  Current Dose: 6 mg (2 mg x 3) every Mon, Thu; 4 mg (2 mg x 2) all other days   Clinical Outcomes       Positives:  INR In Range            Plan: 6 mg (2 mg x 3) every Mon, Thu; 4 mg (2 mg x 2) all other days; recheck in 2 weeks.  Confirmed dosing and testing with patient via MyChart message.    Next INR Date: 9.8.23    Erie Noe, RN SCN  [facility]  Ext. [phone]         For additional information, see dosing calendar below.      Warfarin Therapy Instructions   August 2023 Details    Sun [name] Thu Fri Sat       '1               2               3               4               5                 6               7               8               9               10               11               12                 13               14               15               16               17               18               19                 20               21   '$ 1.6   6 mg   See details      22      4 mg         23      4 mg         24      6 mg         25   1.8   4 mg  See details      26      4 mg           27      4 mg         28      6 mg         29      4 mg         30      4 mg         31      6 mg            Date Details   08/21 Last INR check     INR: 1.6      08/25 This INR check     INR: 1.8                Warfarin Therapy Instructions   September 2023 Details    Sun Mon Tue Wed Thu Fri Sat          1      4 mg         2      4 mg           3      4 mg         4      6 mg         5      4 mg         6      4 mg         7      6 mg         8      4 mg         '9                 10               11               12               13               14               15                16                 17               18               19               20               21               22               23                 24               25               26               '$ 27  $'28               29               30                'j$ Date Details   No additional details    Date of next INR:  06/26/2022

## 2022-06-12 NOTE — Progress Notes (Signed)
Anticoagulation Encounter Note:    Surgical AVR plan   Warfarin start date: 01-06-2019   HVI Staff: Posey Pronto (with fellow)  DX : On-X AVR   INR Range: 1.5 - 2.0  Tab strength: warfarin '2mg'$    Contact 1: [phone] cell   Contact 2:   Lab: POC/[place] - [facility]   Comments: Tylenol regularly    HVI anticoag SCN: Tammi Sou RN ext [phone]    INR: 1.8  Current Dose: 6 mg (2 mg x 3) every Mon, Thu; 4 mg (2 mg x 2) all other days     Clinical Outcomes       Positives:  INR In Range          Patient Findings       Positives:  Change in medications (Will complete amoxicillin tomorrow)            Plan: 6 mg (2 mg x 3) every Mon, Thu; 4 mg (2 mg x 2) all other days; recheck on Wednesday. Confirmed dosing and testing with patient via MyChart message.    Next INR Date: 8.30.23    Erie Noe, RN SCN  [facility]  Ext. [phone]          For additional information, see dosing calendar below.      Warfarin Therapy Instructions   August 2023 Details    Sun Molli Knock Tue Wed Thu Fri Sat       '1               2               3               4               5                 6               7               8               9               10               11               12                 13               14               15               16               17               18               19                 20               21   '$ 1.6   6 mg   See details      22      4 mg         23      4 mg  24      6 mg         25   1.8   4 mg   See details      26      4 mg           27      4 mg         28      6 mg         29      4 mg         30      4 mg         31                  Date Details   08/21 Last INR check     INR: 1.6      08/25 This INR check     INR: 1.8       Date of next INR:  06/17/2022

## 2022-06-17 ENCOUNTER — Encounter (HOSPITAL_COMMUNITY): Payer: Self-pay

## 2022-06-17 ENCOUNTER — Ambulatory Visit (HOSPITAL_COMMUNITY): Payer: Self-pay

## 2022-06-17 LAB — PT/INR: INR: 1.9

## 2022-06-17 NOTE — Progress Notes (Signed)
Anticoagulation Encounter Note:    Surgical AVR plan   Warfarin start date: 01-06-2019   HVI Staff: Posey Pronto (with fellow)  DX : On-X AVR   INR Range: 1.5 - 2.0  Tab strength: warfarin '2mg'$    Contact 1: [phone] cell   Contact 2:   Lab: P[facility]/[place] - [facility]   Comments: Tylenol regularly    HVI anticoag SCN: Tammi Sou RN ext [phone]    INR: 1.9  Current Dose: 6 mg Mon, Thurs, 4 mg all other days  Clinical Outcomes       Positives:  INR In Range            Plan: 6 mg Mon, Thurs, 4 mg all other days; recheck in two weeks, dosage and testing confirmed with the pt via my chart  Next INR Date: 07/01/22  Erie Noe, RN SCN  [facility]  Ext. [phone]       For additional information, see dosing calendar below.      Warfarin Therapy Instructions   August 2023 Details    Sun [name] Thu Fri Sat       '1               2               3               4               5                 6               7               8               9               10               11               12                 13               14               15               16               17               18               19                 20               21               22               23               24               25   '$ 1.8   4 mg   See details      26      4 mg           27  4 mg         28      6 mg         29      4 mg         30   1.9   4 mg   See details      31      6 mg            Date Details   08/25 Last INR check     INR: 1.8      08/30 This INR check     INR: 1.9              Warfarin Therapy Instructions   September 2023 Details    Sun Mon Tue Wed Thu Fri Sat          1      4 mg         2      4 mg           3      4 mg         4      6 mg         5      4 mg         6      4 mg         7      6 mg         8      4 mg         9      4 mg           10      4 mg         11      6 mg         12      4 mg         13      4 mg         '14               15               16                 17               18               19                20               21               22               23                 24               25               26               27               28               '$ 29  30                Date Details   No additional details    Date of next INR:  07/01/2022

## 2022-06-18 ENCOUNTER — Encounter (HOSPITAL_BASED_OUTPATIENT_CLINIC_OR_DEPARTMENT_OTHER): Payer: Self-pay | Admitting: Student in an Organized Health Care Education/Training Program

## 2022-06-18 ENCOUNTER — Other Ambulatory Visit: Payer: Self-pay

## 2022-06-18 ENCOUNTER — Ambulatory Visit
Payer: Medicare Other | Attending: Student in an Organized Health Care Education/Training Program | Admitting: Student in an Organized Health Care Education/Training Program

## 2022-06-18 VITALS — BP 144/82 | HR 79 | Temp 97.0°F | Ht 65.0 in | Wt 221.8 lb

## 2022-06-18 DIAGNOSIS — S60419A Abrasion of unspecified finger, initial encounter: Secondary | ICD-10-CM | POA: Insufficient documentation

## 2022-06-18 DIAGNOSIS — S60415A Abrasion of left ring finger, initial encounter: Secondary | ICD-10-CM

## 2022-06-18 NOTE — Progress Notes (Signed)
CLINIC PROGRESS NOTE  [facility], [facility]  [place] [id number]    [name]  [birth year]  [id number]    Date of Service: 06/18/2022  9:20 AM EDT    Chief complaint:   Chief Complaint   Patient presents with    Cut     Cut finger last night, on blood thinner       Subjective:   [name] is a 66 y.o. female patient who presents for a cut on the top of her left ring finger. Cut finger last night around p.m. with a clean knife.  She initially cleaned up the area with water.  Was able to get the bleeding stopped with pressure.  Kept the finger bandage throughout the night.  This morning when she woke up, she took the bandage off and the gauze had stuck to the abrasion.  Started having bleeding again and has been unable to get bleeding stopped.  She currently takes Coumadin, has not missed any doses.  Most recent INR was 1.9 yesterday.  Otherwise she is feeling well.  Is up-to-date on her tetanus vaccine.  She is currently on antibiotics for a separate issue.    REVIEW OF SYSTEMS:  ROS as above.    PHQ Questionnaire  Little interest or pleasure in doing things.: Not at all  Feeling down, depressed, or hopeless: Not at all  PHQ 2 Total: 0    Health Maintenance Due   Topic Date Due    Shingles Vaccine (1 of 2) Never done     Current Outpatient Medications   Medication Sig    acetaminophen (TYLENOL) 500 mg Oral Tablet Take 650 mg by mouth Every night Taking 2 daily    ascorbic acid, vitamin C, (VITAMIN C) 500 mg Oral Tablet Take 0.5 Tablets (250 mg total) by mouth Once a day    aspirin 81 mg Oral Tablet, Chewable Take 1 Tablet (81 mg total) by mouth Once a day    Bifidobacterium infantis (ALIGN ORAL) Take by mouth    Cholecalciferol, Vitamin D3, 50 mcg (2,000 unit) Oral Capsule Take 2,000 Units by mouth Once a day     clobetasoL (TEMOVATE) 0.05 % Cream Apply topically Twice daily    Fluocinolone-Shower Cap 0.01 % Oil Apply to scalp 1-2 times weekly at night.     hydroCHLOROthiazide (HYDRODIURIL) 25 mg Oral Tablet Take 1 Tablet (25 mg total) by mouth Once a day    loratadine (CLARITIN) 10 mg Oral Tablet Take 1 Tablet (10 mg total) by mouth Once a day    metoprolol succinate (TOPROL-XL) 50 mg Oral Tablet Sustained Release 24 hr Take 1 Tablet (50 mg total) by mouth Once a day    triamcinolone acetonide (ARISTOCORT A) 0.1 % Cream Apply topically Twice daily    warfarin (COUMADIN) 2 mg Oral Tablet Take 3 Tablets (6 mg total) by mouth Every evening Or as directed by Anticoag clinic (Patient taking differently: Take 3 Tablets (6 mg total) by mouth Every evening Or as directed by Anticoagulant clinic)     Objective:   BP (!) 144/82   Pulse 79   Temp 36.1 C (97 F) (Temporal)   Ht 1.651 m ('5\' 5"'$ )   Wt 101 kg (221 lb 12.5 oz)   SpO2 96%   BMI 36.91 kg/m       General appearance: alert, no acute distress  Abdomen: soft, non-tender, non-distended, BS+, no guarding  Extremities: atraumatic, no edema  Neuro: grossly normal, gait  steady  Skin:  Approximately 0.5 cm abrasion present to tip of left 4th finger with oozing blood, no surrounding erythema.  No purulent drainage.    Assessment/Plan     ENCOUNTER DIAGNOSES     ICD-10-CM   1. Abrasion of skin of finger of left hand  S60.419A   Discussed with patient that due to the abrasion being superficial, would likely not benefit from a suture.  Wound irrigated with sterile water. Pressure bandage applied in clinic today.    Advised patient to change bandage once daily, supplies given to continue with pressure bandages.  Explained to patient that if bleeding continues throughout the day today and into tonight, to go to the emergency department.  Last Tdap administered in 2020.    Orders Placed This Encounter    CANCELED: Lac Repair    CANCELED: Laceration Repair-ProcDoc    CANCELED: Tdap Vaccine (Boostrix) >= 10 yrs     The patient was given the opportunity to ask questions and those questions were answered to the patient's  satisfaction. The patient was encouraged to call with any additional questions or concerns. Instructed patient to call back if symptoms worsen.   Discussed with patient effects and side effects of medications. Medication safety was discussed. A copy of the patient's medication list was printed and given to the patient. A good faith effort was made to reconcile the patient's medications.     Follow up:  Return in about 2 weeks (around 07/02/2022) for suture removal .      Dylon Correa, DO   06/18/2022, 08:47

## 2022-06-18 NOTE — Procedures (Deleted)
FAMILY [facility]ICINE, [facility]  [place] [id number]  Operated by [place]  Procedure Note    Name: Laportia Carley MRN:  [id number]   Date: 06/18/2022 Age: 66 y.o.       Lac Repair  Performed by: Lanny Cramp, DO  Authorized by: Lanny Cramp, DO     Consent:     Consent obtained:  Verbal    Consent given by:  Patient    Risks discussed:  Infection, pain and retained foreign body    Alternatives discussed:  No treatment, delayed treatment and observation  Universal protocol:     Procedure explained and questions answered to patient or proxy's satisfaction: yes      Relevant documents present and verified: yes      Test results available: yes      Imaging studies available: yes      Required blood products, implants, devices, and special equipment available: yes      Site/side marked: yes      Immediately prior to procedure, a time out was called: yes      Patient identity confirmed:  Verbally with patient  Anesthesia:     Anesthesia method:  Local infiltration    Local anesthetic:  Lidocaine 1% w/o epi  Pre-procedure details:     Preparation:  Patient was prepped and draped in usual sterile fashion  Exploration:     Wound exploration: wound explored through full range of motion and entire depth of wound visualized      Contaminated: no    Treatment:     Area cleansed with:  Soap and water    Amount of cleaning:  Standard    Irrigation solution:  Sterile saline    Irrigation method:  Syringe    Debridement:  None    Undermining:  None  Skin repair:     Repair method:  Sutures    Suture size:  4-0    Suture technique:  Simple interrupted    Aolanis Crispen, DO

## 2022-07-01 ENCOUNTER — Encounter (HOSPITAL_COMMUNITY): Payer: Self-pay

## 2022-07-01 ENCOUNTER — Other Ambulatory Visit (HOSPITAL_COMMUNITY): Payer: Self-pay | Admitting: Cardiovascular Disease

## 2022-07-01 ENCOUNTER — Ambulatory Visit (HOSPITAL_COMMUNITY): Payer: Self-pay

## 2022-07-01 DIAGNOSIS — Z952 Presence of prosthetic heart valve: Secondary | ICD-10-CM

## 2022-07-01 LAB — PT/INR: INR: 2

## 2022-07-01 NOTE — Progress Notes (Signed)
Anticoagulation Encounter Note:    Surgical AVR plan   Warfarin start date: 01-06-2019   HVI Staff: Posey Pronto (with fellow)  DX : On-X AVR   INR Range: 1.5 - 2.0  Tab strength: warfarin '2mg'$    Contact 1: [phone] cell   Contact 2:   Lab: POC/[place] - [facility]   Comments: Tylenol regularly    HVI anticoag SCN: Tammi Sou RN ext [phone]    INR: 2.0  Current Dose: 6 mg m/Th, 4 mg all other days  Clinical Outcomes       Positives:  INR In Range            Plan: 6 mg m/Th, 4 mg all other days; recheck in 2 weeks.  Confirmed dosing and testing with patient via MyChart message.    Next INR Date: 9.27.23    Erie Noe, RN SCN  [facility]  Ext. [phone]         For additional information, see dosing calendar below.      Warfarin Therapy Instructions   August 2023 Details    Sun [name] Thu Fri Sat       '1               2               3               4               5                 6               7               8               9               10               11               12                 13               14               15               16               17               18               19                 20               21               22               23               24               25               26                 '$ 27  $'28               29               30   'K$ 1.9   4 mg   See details      31      6 mg            Date Details   08/30 Last INR check     INR: 1.9                Warfarin Therapy Instructions   September 2023 Details    Sun Mon Tue Wed Thu Fri Sat          1      4 mg         2      4 mg           3      4 mg         4      6 mg         5      4 mg         6      4 mg         7      6 mg         8      4 mg         9      4 mg           10      4 mg         11      6 mg         12      4 mg         13   2.0   4 mg   See details      14      6 mg         15      4 mg         16      4 mg           17      4 mg         18      6 mg         19      4 mg         20      4 mg          21      6 mg         22      4 mg         23      4 mg           24      4 mg         25      6 mg         26      4 mg         27      4 mg         28               29               30  Date Details   09/13 This INR check     INR: 2.0       Date of next INR:  07/15/2022

## 2022-07-02 MED ORDER — WARFARIN 2 MG TABLET
6.0000 mg | ORAL_TABLET | Freq: Every evening | ORAL | 3 refills | Status: DC
Start: 2022-07-02 — End: 2023-02-22

## 2022-07-15 ENCOUNTER — Ambulatory Visit (HOSPITAL_COMMUNITY): Payer: Self-pay

## 2022-07-15 ENCOUNTER — Encounter (HOSPITAL_COMMUNITY): Payer: Self-pay

## 2022-07-15 LAB — PT/INR: INR: 2.4

## 2022-07-16 NOTE — Progress Notes (Signed)
Anticoagulation Encounter Note:    Surgical AVR plan   Warfarin start date: 01-06-2019   HVI Staff: Posey Pronto (with fellow)  DX : On-X AVR   INR Range: 1.5 - 2.0  Tab strength: warfarin '2mg'$    Contact 1: [phone] cell   Contact 2:   Lab: POC/[place] - [facility]   Comments: Tylenol regularly    HVI anticoag SCN: Tammi Sou RN ext [phone]    INR: 2.4  Current Dose: 6 mg MTh, 4 mg all other days  Clinical Outcomes       Positives:  INR Above Goal          Patient Findings       Positives:  Change in diet/appetite (hasn't been eating green leafy vegetables per her usual; will start eating broccoli and spinach again. again.)          Plan: 6 mg MTh, 4 mg all other days; recheck in 2 weeks.  Confirmed dosing and testing with patient via MyChart message.    Next INR Date: 10.11.23    Erie Noe, RN SCN  [facility]  Ext. [phone]         For additional information, see dosing calendar below.      Warfarin Therapy Instructions   September 2023 Details    Sun Molli Knock Tue Wed Thu Fri Sat          '1               2                 3               4               5               6               7               8               9                 10               11               12               13   '$ 2.0   4 mg   See details      14      6 mg         15      4 mg         16      4 mg           17      4 mg         18      6 mg         19      4 mg         20      4 mg         21      6 mg         22      4 mg         23      4 mg  24      4 mg         25      6 mg         26      4 mg         27   2.4   4 mg   See details      28      6 mg         29      4 mg         30      4 mg          Date Details   09/13 Last INR check     INR: 2.0      09/27 This INR check     INR: 2.4              Warfarin Therapy Instructions   October 2023 Details    Sun Mon Tue Wed Thu Fri Sat     1      4 mg         2      6 mg         3      4 mg         4      4 mg         5      6 mg         6      4 mg         7      4 mg           8      4 mg          9      6 mg         10      4 mg         11      4 mg         '12               13               14                 15               16               17               18               19               20               21                 22               23               24               25               26               '$ 27  $'28                 29               30               31                    'g$ Date Details   No additional details    Date of next INR:  07/29/2022

## 2022-07-22 ENCOUNTER — Ambulatory Visit: Payer: Medicare Other | Attending: Family | Admitting: Family

## 2022-07-22 ENCOUNTER — Encounter (HOSPITAL_BASED_OUTPATIENT_CLINIC_OR_DEPARTMENT_OTHER): Payer: Self-pay | Admitting: Family

## 2022-07-22 ENCOUNTER — Other Ambulatory Visit: Payer: Self-pay

## 2022-07-22 VITALS — BP 128/82 | HR 79 | Temp 97.2°F | Ht 65.0 in | Wt 224.0 lb

## 2022-07-22 DIAGNOSIS — B3731 Acute candidiasis of vulva and vagina: Secondary | ICD-10-CM | POA: Insufficient documentation

## 2022-07-22 MED ORDER — FLUCONAZOLE 200 MG TABLET
ORAL_TABLET | ORAL | 0 refills | Status: DC
Start: 2022-07-22 — End: 2022-11-26

## 2022-07-22 NOTE — Progress Notes (Signed)
[facility]: Family Medicine Office Visit  Progress Note    [name]  [id number]  January 28, 1956    Date of Service:  07/22/2022     Chief Complaint   Patient presents with    Vaginal Irritation     Inflamed, painful        History of Present Illness:  Patient was given Augmentin, then amoxicillin for a longer period of time.  Since that time she has experienced vaginal irritation, skin redness, burning.  She is been taking probiotics including oral capsule and yogurts daily, she is been using topical Vaseline and diaper rash cream with no improvement of her symptoms.  It is now keeping her up at night.  She endorses having painful burning sensation to her vulva, labia, and anus area.    Patient Active Problem List   Diagnosis    Essential hypertension    S/P hip replacement    Low back pain radiating to left leg    Severe aortic stenosis    S/P AVR (aortic valve replacement)    Osteoarthritis of right hip    Psoriasis of scalp    Hx of inverse psoriasis    Postoperative atrial fibrillation (CMS HCC)    Primary osteoarthritis of right hip       Medication list:   acetaminophen (TYLENOL) 500 mg Oral Tablet Take 650 mg by mouth Every night Taking 2 daily    ascorbic acid, vitamin C, (VITAMIN C) 500 mg Oral Tablet Take 0.5 Tablets (250 mg total) by mouth Once a day    aspirin 81 mg Oral Tablet, Chewable Take 1 Tablet (81 mg total) by mouth Once a day    Bifidobacterium infantis (ALIGN ORAL) Take by mouth    Cholecalciferol, Vitamin D3, 50 mcg (2,000 unit) Oral Capsule Take 2,000 Units by mouth Once a day     clobetasoL (TEMOVATE) 0.05 % Cream Apply topically Twice daily    fluconazole (DIFLUCAN) 200 mg Oral Tablet Take one tab PO today and repeat in 72 hours    Fluocinolone-Shower Cap 0.01 % Oil Apply to scalp 1-2 times weekly at night.    hydroCHLOROthiazide (HYDRODIURIL) 25 mg Oral Tablet Take 1 Tablet (25 mg total) by mouth Once a day    loratadine (CLARITIN) 10 mg Oral Tablet Take 1 Tablet (10 mg  total) by mouth Once a day (Patient not taking: Reported on 07/22/2022)    metoprolol succinate (TOPROL-XL) 50 mg Oral Tablet Sustained Release 24 hr Take 1 Tablet (50 mg total) by mouth Once a day    triamcinolone acetonide (ARISTOCORT A) 0.1 % Cream Apply topically Twice daily    warfarin (COUMADIN) 2 mg Oral Tablet Take 3 Tablets (6 mg total) by mouth Every evening Or as directed by Anticoag clinic        Allergies   Allergen Reactions    Iv Contrast Anaphylaxis    Oregano      Hives      Stadol [Butorphanol Tartrate] Mental Status Effect    Tomato Hives/ Urticaria       Review of Systems   Skin:  Positive for color change and rash.       Physical Exam:  BP 128/82   Pulse 79   Temp 36.2 C (97.2 F)   Ht 1.651 m ('5\' 5"'$ )   Wt 102 kg (223 lb 15.8 oz)   SpO2 96%   BMI 37.27 kg/m       General:  appears in good health and  no distress  Pelvic exam: significant excoriation and erythematous to entire vaginal and anal area. Satellite lesions present.         ICD-10-CM    1. Candida vaginitis  B37.31 fluconazole (DIFLUCAN) 200 mg Oral Tablet        Patient was significant excoriation to her vaginal and anal area after the use of antibiotics, we will treat with oral fluconazole 200 mg today, then repeat 1 time in 72 hours.  Given the use of Coumadin, she is going to contact her INR folks to let them be aware of the current medication treatment.  If this does not work after 1 week I would like for her to message me via [place], she may need a burst of fluconazole.    Of note, patient has developed a penicillin allergy, she is having dental procedures.  I will message ID on 1st choice agent to use for status post hip replacement with penicillin allergy.    Return if symptoms worsen or fail to improve.    Derek Jack, APRN,FNP-BC  [facility]  FAMILY MED-CHEAT  [address]  [place], [place] [address]  [phone]

## 2022-07-23 ENCOUNTER — Encounter (HOSPITAL_COMMUNITY): Payer: Self-pay

## 2022-07-23 ENCOUNTER — Ambulatory Visit (HOSPITAL_COMMUNITY): Payer: Self-pay

## 2022-07-23 LAB — PT/INR: INR: 1.7

## 2022-07-23 NOTE — Progress Notes (Signed)
Anticoagulation Encounter Note:    Surgical AVR plan   Warfarin start date: 01-06-2019   HVI Staff: Posey Pronto (with fellow)  DX : On-X AVR   INR Range: 1.5 - 2.0  Tab strength: warfarin 57m   Contact 1: [phone]   Contact 2:   Lab: POC/[place] - [facility]   Comments: Tylenol regularly    HVI anticoag SCN: STammi SouRN ext [phone]   INR: 1.7  Current Dose: 6 mg Th/M, 4 mg all other days  Clinical Outcomes       Positives:  INR In Range          Patient Findings       Positives:  Change in medications (took Diflucan yesterday and will take second dose Saturday.)          Plan: 6 mg Thursday, 4 mg all other days; recheck on Monday. Confirmed dosing and testing with patient via MyChart message.    Next INR Date: 10.9.23    MErie Noe RN SCN  [facility]  Ext. [phone]       For additional information, see dosing calendar below.      Warfarin Therapy Instructions   September 2023 Details    Sun MGlori LuisThu Fri Sat          1               2                 3               4               5               6               7               8               9                 10               11               12               13               14               15               16                 17               18               19               20               21               22               23                 24               $ 25  26               27   2.4   4 mg   See details      28      6 mg         29      4 mg         30      4 mg          Date Details   09/27 Last INR check     INR: 2.4              Warfarin Therapy Instructions   October 2023 Details    Sun Mon Tue Wed Thu Fri Sat     1      4 mg         2      6 mg         3      4 mg         4      4 mg         5   1.7   6 mg   See details      6      4 mg         7      4 mg           8      4 mg         9      6 mg         10               11               12               13               14                 15               16                17               18               19               20               21                 22               23               24               25               26               27               28                 29               30               $ 31  Date Details   10/05 This INR check     INR: 1.7       Date of next INR:  07/27/2022

## 2022-07-27 ENCOUNTER — Ambulatory Visit (HOSPITAL_COMMUNITY): Payer: Self-pay

## 2022-07-27 ENCOUNTER — Encounter (HOSPITAL_COMMUNITY): Payer: Self-pay

## 2022-07-27 LAB — PT/INR: INR: 2.4

## 2022-07-27 NOTE — Progress Notes (Signed)
Anticoagulation Encounter Note:    Surgical AVR plan   Warfarin start date: 01-06-2019   HVI Staff: Posey Pronto (with fellow)  DX : On-X AVR   INR Range: 1.5 - 2.0  Tab strength: warfarin 72m   Contact 1: [phone]   Contact 2:   Lab: POC/[place] - [facility]   Comments: Tylenol regularly    HVI anticoag SCN: STammi SouRN ext [phone]   INR: 2.4  Current Dose: 6 mg Thursday, 4 mg F/Sa/Su  Clinical Outcomes       Positives:  INR Above Goal          Patient Findings       Positives:  Change in medications (took Diflucan Saturday)          Plan: 4 mg daily; recheck on Thursday (usual testing day). Confirmed dosing and testing with patient via MyChart message.    Next INR Date: 10.12.23    MErie Noe RN SCN  [facility]  Ext. [phone]         For additional information, see dosing calendar below.      Warfarin Therapy Instructions   October 2023 Details    Sun MMolli KnockTue Wed Thu Fri Sat     1               2               3               4               5   $ 1.7   6 mg   See details      6      4 mg         7      4 mg           8      4 mg         9   2.4   4 mg   See details      10      4 mg         11      4 mg         12      6 mg         13               14                 15               16               17               18               19               20               21                 22               23               24               $ 25  $26               27               28                 29               30               31                    D$ Date Details   10/05 Last INR check     INR: 1.7      10/09 This INR check     INR: 2.4       Date of next INR:  07/30/2022

## 2022-07-28 ENCOUNTER — Encounter (HOSPITAL_BASED_OUTPATIENT_CLINIC_OR_DEPARTMENT_OTHER): Payer: Self-pay | Admitting: Family

## 2022-07-29 ENCOUNTER — Encounter (HOSPITAL_BASED_OUTPATIENT_CLINIC_OR_DEPARTMENT_OTHER): Payer: Self-pay | Admitting: Family

## 2022-07-29 ENCOUNTER — Ambulatory Visit: Payer: Medicare Other | Attending: Family | Admitting: Family

## 2022-07-29 ENCOUNTER — Other Ambulatory Visit: Payer: Self-pay

## 2022-07-29 VITALS — BP 128/72 | HR 77 | Temp 96.8°F | Ht 65.0 in | Wt 222.9 lb

## 2022-07-29 DIAGNOSIS — B3731 Acute candidiasis of vulva and vagina: Secondary | ICD-10-CM | POA: Insufficient documentation

## 2022-07-29 DIAGNOSIS — L309 Dermatitis, unspecified: Secondary | ICD-10-CM | POA: Insufficient documentation

## 2022-07-29 MED ORDER — CLOTRIMAZOLE 1 % TOPICAL CREAM
TOPICAL_CREAM | Freq: Two times a day (BID) | CUTANEOUS | 1 refills | Status: DC
Start: 2022-07-29 — End: 2022-08-11

## 2022-07-29 MED ORDER — CLOBETASOL 0.05 % TOPICAL CREAM
TOPICAL_CREAM | Freq: Every evening | CUTANEOUS | 1 refills | Status: AC
Start: 2022-07-29 — End: 2022-08-28

## 2022-07-29 NOTE — Progress Notes (Signed)
[facility] Physicians: Family Medicine Office Visit  Progress Note    [name]  [id number]  Sep 23, 1956    Date of Service:  07/29/2022     Chief Complaint   Patient presents with    Female Genital Problem       History of Present Illness:  Patient was given Augmentin, then amoxicillin for a longer period of time.  Since that time she has experienced vaginal irritation, skin redness, burning.  She is been taking probiotics including oral capsule and yogurts daily, she is been using topical Vaseline and diaper rash cream with no improvement of her symptoms. Pt was seen last week and given 2 doses of diflucan, s/s started to improve, now worsening.   It is now keeping her up at night.  She endorses having painful burning sensation to her vulva, labia, and anus area.    Patient Active Problem List   Diagnosis    Essential hypertension    S/P hip replacement    Low back pain radiating to left leg    Severe aortic stenosis    S/P AVR (aortic valve replacement)    Osteoarthritis of right hip    Psoriasis of scalp    Hx of inverse psoriasis    Postoperative atrial fibrillation (CMS HCC)    Primary osteoarthritis of right hip       Medication list:   acetaminophen (TYLENOL) 500 mg Oral Tablet Take 650 mg by mouth Every night Taking 2 daily    ascorbic acid, vitamin C, (VITAMIN C) 500 mg Oral Tablet Take 0.5 Tablets (250 mg total) by mouth Once a day    aspirin 81 mg Oral Tablet, Chewable Take 1 Tablet (81 mg total) by mouth Once a day    Bifidobacterium infantis (ALIGN ORAL) Take by mouth    Cholecalciferol, Vitamin D3, 50 mcg (2,000 unit) Oral Capsule Take 2,000 Units by mouth Once a day     clobetasoL (TEMOVATE) 0.05 % Cream Apply topically Twice daily    clobetasoL (TEMOVATE) 0.05 % Cream Apply topically Every night for 30 days    clotrimazole (LOTRIMIN) 1 % Cream Apply topically Twice daily for 10 days    fluconazole (DIFLUCAN) 200 mg Oral Tablet Take one tab PO today and repeat in 72 hours    Fluocinolone-Shower  Cap 0.01 % Oil Apply to scalp 1-2 times weekly at night.    hydroCHLOROthiazide (HYDRODIURIL) 25 mg Oral Tablet Take 1 Tablet (25 mg total) by mouth Once a day    loratadine (CLARITIN) 10 mg Oral Tablet Take 1 Tablet (10 mg total) by mouth Once a day (Patient not taking: Reported on 07/22/2022)    metoprolol succinate (TOPROL-XL) 50 mg Oral Tablet Sustained Release 24 hr Take 1 Tablet (50 mg total) by mouth Once a day    triamcinolone acetonide (ARISTOCORT A) 0.1 % Cream Apply topically Twice daily    warfarin (COUMADIN) 2 mg Oral Tablet Take 3 Tablets (6 mg total) by mouth Every evening Or as directed by Anticoag clinic        Allergies   Allergen Reactions    Iv Contrast Anaphylaxis    Oregano      Hives      Stadol [Butorphanol Tartrate] Mental Status Effect    Tomato Hives/ Urticaria       Review of Systems   Skin:  Positive for color change and rash.   Positive valvular and vaginal irritation    Physical Exam:  BP 128/72   Pulse  77   Temp 36 C (96.8 F)   Ht 1.651 m ('5\' 5"'$ )   Wt 101 kg (222 lb 14.2 oz)   SpO2 95%   BMI 37.09 kg/m       General:  appears in good health and no distress  Pelvic exam: significant excoriation and erythematous to entire vaginal and anal area. Satellite lesions present.         ICD-10-CM    1. Vulvar dermatitis  L30.9 clobetasoL (TEMOVATE) 0.05 % Cream     clotrimazole (LOTRIMIN) 1 % Cream      2. Vaginal yeast infection  B37.31 clobetasoL (TEMOVATE) 0.05 % Cream     clotrimazole (LOTRIMIN) 1 % Cream        Patient was significant excoriation to her vaginal and anal area after the use of antibiotics,  was treated with oral fluconazole 200 mg today for 2 doses and this did into help her.   Given the use of Coumadin, will do clobetasol nightly times 30 days and clotrimazole was all b.i.d. times 10 days.  If this does not work after 2 weeks I would like for her to message me via [place], she may need a referral to see Gynecology.      Return if symptoms worsen or fail to  improve.    Derek Jack, APRN,FNP-BC  [facility]  FAMILY MED-CHEAT  [address]  [place], [place] [address]  [phone]

## 2022-07-30 ENCOUNTER — Ambulatory Visit (HOSPITAL_COMMUNITY): Payer: Self-pay

## 2022-07-30 ENCOUNTER — Encounter (HOSPITAL_COMMUNITY): Payer: Self-pay

## 2022-07-30 LAB — PT/INR: INR: 2.7

## 2022-07-30 NOTE — Progress Notes (Signed)
Anticoagulation Encounter Note:    Surgical AVR plan   Warfarin start date: 01-06-2019   HVI Staff: Posey Pronto (with fellow)  DX : On-X AVR   INR Range: 1.5 - 2.0  Tab strength: warfarin '2mg'$    Contact 1: [phone] cell   Contact 2:   Lab: POC/ - [facility]   Comments: Tylenol regularly    HVI anticoag SCN: Tammi Sou RN ext [phone]    INR: 2.7  Current Dose: 4 mg daily  Clinical Outcomes       Positives:  INR Above Goal            Plan: hold today, then 4 mg daily; recheck in one week. Confirmed dosing and testing with patient via MyChart message.    Next INR Date: 10.19.23    Erie Noe, RN SCN  [facility]  Ext. [phone]          For additional information, see dosing calendar below.      Warfarin Therapy Instructions   October 2023 Details    Sun Molli Knock Tue Wed Thu Fri Sat     '1               2               3               4               5               6               7                 8               9   '$ 2.4   4 mg   See details      10      4 mg         11      4 mg         12   2.7   Hold   See details      13      4 mg         14      4 mg           15      4 mg         16      4 mg         17      4 mg         18      4 mg         19      6 mg         '20               21                 22               23               24               25               26               '$ 27  $'28                 29               30               31                    'X$ Date Details   10/09 Last INR check     INR: 2.4      10/12 This INR check     INR: 2.7       Date of next INR:  08/06/2022

## 2022-08-04 ENCOUNTER — Encounter (HOSPITAL_COMMUNITY): Payer: Self-pay

## 2022-08-05 ENCOUNTER — Ambulatory Visit (HOSPITAL_COMMUNITY): Payer: Self-pay

## 2022-08-05 ENCOUNTER — Encounter (HOSPITAL_COMMUNITY): Payer: Self-pay

## 2022-08-05 LAB — PT/INR: INR: 1.5

## 2022-08-05 NOTE — Progress Notes (Cosign Needed)
Anticoagulation Encounter Note:    Surgical AVR plan   Warfarin start date: 01-06-2019   HVI Staff: Posey Pronto (with fellow)  DX : On-X AVR   INR Range: 1.5 - 2.0  Tab strength: warfarin '2mg'$    Contact 1: [phone] cell   Contact 2:   Lab: POC/[place] - [facility]   Comments: Tylenol regularly    HVI anticoag SCN: Tammi Sou RN ext [phone]    INR: 1.5  Current Dose: held Thursday, 4 mg all other days  Clinical Outcomes       Positives:  INR In Range            Plan: 4 mg daily; recheck in one week (on Thursday). Confirmed dosing and testing with patient via MyChart message.    Next INR Date: 10.26.23    Erie Noe, RN SCN  [facility]  Ext. [phone]            For additional information, see dosing calendar below.      Warfarin Therapy Instructions   October 2023 Details    Sun Molli Knock Tue Wed Thu Fri Sat     '1               2               3               4               5               6               7                 8               9               10               11               12   '$ 2.7   Hold   See details      13      4 mg         14      4 mg           15      4 mg         16      4 mg         17      4 mg         18   1.5   4 mg   See details      19      4 mg         20      4 mg         21      4 mg           22      4 mg         23      4 mg         24      4 mg         25      4 mg         26      6 mg  $'27               28                 29               30               31                    'l$ Date Details   10/12 Last INR check     INR: 2.7      10/18 This INR check     INR: 1.5       Date of next INR:  08/13/2022

## 2022-08-11 ENCOUNTER — Other Ambulatory Visit (HOSPITAL_BASED_OUTPATIENT_CLINIC_OR_DEPARTMENT_OTHER): Payer: Self-pay | Admitting: Family

## 2022-08-11 DIAGNOSIS — L309 Dermatitis, unspecified: Secondary | ICD-10-CM

## 2022-08-11 DIAGNOSIS — B3731 Acute candidiasis of vulva and vagina: Secondary | ICD-10-CM

## 2022-08-12 ENCOUNTER — Encounter (INDEPENDENT_AMBULATORY_CARE_PROVIDER_SITE_OTHER): Payer: Self-pay | Admitting: Ophthalmology

## 2022-08-12 ENCOUNTER — Ambulatory Visit: Payer: Medicare Other | Attending: Ophthalmology | Admitting: Ophthalmology

## 2022-08-12 ENCOUNTER — Ambulatory Visit (INDEPENDENT_AMBULATORY_CARE_PROVIDER_SITE_OTHER): Payer: Self-pay | Admitting: Ophthalmology

## 2022-08-12 ENCOUNTER — Other Ambulatory Visit: Payer: Self-pay

## 2022-08-12 DIAGNOSIS — H43393 Other vitreous opacities, bilateral: Secondary | ICD-10-CM | POA: Insufficient documentation

## 2022-08-12 DIAGNOSIS — H43392 Other vitreous opacities, left eye: Secondary | ICD-10-CM

## 2022-08-12 DIAGNOSIS — H25813 Combined forms of age-related cataract, bilateral: Secondary | ICD-10-CM

## 2022-08-12 NOTE — Progress Notes (Addendum)
[facility]  [place] [address]  Operated by [facility]         Patient Name: [name]  MRN#: [id number]  [place]: 05-09-1956    Date of Service: 08/12/2022    Chief Complaint    Yearly Eye Exam         [name] is a 66 y.o. female who presents today for evaluation/consultation of:  HPI    Pt is here today for 66yrcat check w/ REE  Pt states that since her last visit she feels that vision is not as sharp.   Pt wears Rx FT   Pt states that she still notices the floaters, Denies them increasing.   No other concerns or complaints   Last edited by NCherrie Gauze COA on 08/12/2022  9:49 AM.        ROS    Positive for: Eyes  Negative for: Constitutional, Gastrointestinal, Neurological, Skin, Genitourinary, Musculoskeletal, HENT, Endocrine, Cardiovascular, Respiratory, Psychiatric, Allergic/Imm, Heme/Lymph  Last edited by NCherrie Gauze COA on 08/12/2022  9:49 AM.         All other systems Negative    SCherrie Gauze COA  08/12/2022, 09:50  Base Eye Exam       Visual Acuity (Snellen - Linear)         Right Left    Dist cc 20/30 +1 20/30 +2    Dist ph cc 20/25 +2 NI      Correction: Glasses              Tonometry (Tonopen, 10:09 AM)         Right Left    Pressure 16 14              Pupils         Shape React APD    Right Round Brisk None    Left Round Brisk None              Visual Fields (Counting fingers)         Right Left     Full Full              Extraocular Movement         Right Left     Full Full              Neuro/Psych       Oriented x3: Yes    Mood/Affect: Normal              Dilation       Both eyes: 1.0% Mydriacyl, 2.5% Phenylephrine, 1.0% Cyclogyl, 0.5% Proparacaine @ 10:09 AM                  Additional Tests       Glare Testing (Transilluminator)         High    Right 20/40    Left 20/40                  Slit Lamp and Fundus Exam       External Exam         Right Left    External Normal Normal              Slit Lamp Exam          Right Left    Lids/Lashes Dermatochalasis - upper lid Dermatochalasis - upper lid    Conjunctiva/Sclera Injection Injection    Cornea Clear Clear  Anterior Chamber Deep and quiet Deep and quiet    Iris Round and reactive Round and reactive    Lens Nuclear sclerosis Nuclear sclerosis    Anterior Vitreous Normal PVD              Fundus Exam         Right Left    Disc Normal Normal    C/D Ratio 0.3 0.3    Macula Normal Normal    Vessels Normal Normal    Periphery peripheral drusen peripheral drusen                  Refraction       Wearing Rx         Sphere Cylinder Axis Add    Right -4.25 +1.75 019 +2.50    Left -4.00 +1.25 173 +2.50      Age: 66yr    Type: PAL              Manifest Refraction         Sphere Cylinder Axis Dist [place]   Right -3.25 +1.75 028 20/20 +2.75 20/20    Left -3.00 +2.00 173 20/20 +2.75 20/20              Final Rx         Sphere Cylinder Axis Dist VA Add [place]   Right -3.25 +1.75 028 20/20 +2.75 20/20    Left -3.00 +2.00 173 20/20 +2.75 20/20      Type: PAL    Expiration Date: 08/13/2023                    MD Addition to HPI: Patient [presents for cataract eval. States vision is stable. Glare at night is bothersome. No other concerns.           ENCOUNTER DIAGNOSES     ICD-10-CM   1. Floaters in visual field, bilateral  H43.393   2. Combined form of age-related cataract, both eyes  H25.813     No orders of the defined types were placed in this encounter.      Ophthalmic Plan of Care:    [place]  - Glare to 20/40 OU  - Prefers to monitor  - Follow up in one year    PVD OS  - Discussed signs and symptoms of a retinal detachment (including increase in floaters or flashing lights, worsening vision, or the appearance of a persistent curtain or shadow anywhere in the field of vision) and told to call/return immediately should these symptoms develop. Emergency contact information provided to patient.       Follow up:    I have asked [name] Ryeto follow up 1 year            JMartinique Guffey, MD  08/12/2022, 10:26      I have seen and examined the above patient. I discussed the above diagnoses listed in the assessment and the above ophthalmic plan of care with the patient and patient's family. All questions were answered. I reviewed and, when necessary, made changes to the technician/resident note, documented ophthalmology exam, chief complaint, history of present illness, allergies, review of systems, past medical, past surgical, family and social history. I personally reviewed and interpreted all testing and/or imaging performed at this visit and agree with the resident's or fellow's interpretation. Any exceptions/additions are edited/noted in the relevant encounter fields.  I reviewed and made necessary changes to the technician, resident or fellow note regarding CC/HPI/ROS/Past Family, Medical and Social Hx/Surg Hx.  Gwendalyn Ege, MD 08/12/2022, 10:28      I saw and examined the patient.  I reviewed the resident's note.  I agree with the findings and plan of care as documented in the resident's note. I personally reviewed and interpreted all testing and/or imaging performed at this visit and agree with the resident or fellow's interpretation. Any exceptions/additions are edited/noted.    Gwendalyn Ege, MD 08/12/2022, 10:28

## 2022-08-13 ENCOUNTER — Ambulatory Visit (HOSPITAL_COMMUNITY): Payer: Self-pay

## 2022-08-13 ENCOUNTER — Encounter (HOSPITAL_COMMUNITY): Payer: Self-pay

## 2022-08-13 LAB — PT/INR: INR: 1.5

## 2022-08-13 NOTE — Progress Notes (Signed)
Anticoagulation Encounter Note:    Surgical AVR plan   Warfarin start date: 01-06-2019   HVI Staff: Posey Pronto (with fellow)  DX : On-X AVR   INR Range: 1.5 - 2.0  Tab strength: warfarin '2mg'$    Contact 1: [phone] cell   Contact 2:   Lab: POC/[place] - [facility]   Comments: Tylenol regularly    HVI anticoag SCN: Tammi Sou RN ext [phone]    INR: 1.5  Current Dose: 4 mg daily  Clinical Outcomes       Positives:  INR In Range            Plan: 6 mg Thursday, 4 mg all other days; recheck in one week. Confirmed dosing and testing with patient via MyChart message.    Next INR Date: 11.2.23    Erie Noe, RN SCN  [facility]  Ext. [phone]          For additional information, see dosing calendar below.      Warfarin Therapy Instructions   October 2023 Details    Sun Molli Knock Tue Wed Thu Fri Sat     '1               2               3               4               5               6               7                 8               9               10               11               12               13               14                 15               16               17               18      4 '$ mg   See details      19      4 mg         20      4 mg         21      4 mg           22      4 mg         23      4 mg         24      4 mg         25      4 mg         26   1.5   6 mg   See details      27  4 mg         28      4 mg           29      4 mg         30      4 mg         31      4 mg              Date Details   10/18 Last INR check      10/26 This INR check     INR: 1.5              Warfarin Therapy Instructions   November 2023 Details    Sun Mon Tue Wed Thu Fri Sat        1      4 mg         2      6 mg         3      4 mg         4      4 mg           5      4 mg         6      4 mg         7      4 mg         8      4 mg         '9               10               11                 12               13               14               15               16               17               18                 19               20                21               22               23               24               25                 26               27               28               29               '$ 30  Date Details   No additional details    Date of next INR:  08/26/2022

## 2022-08-26 ENCOUNTER — Encounter (HOSPITAL_COMMUNITY): Payer: Self-pay

## 2022-08-26 ENCOUNTER — Ambulatory Visit (HOSPITAL_COMMUNITY): Payer: Self-pay

## 2022-08-26 LAB — PT/INR: INR: 1.5

## 2022-08-26 NOTE — Progress Notes (Signed)
Anticoagulation Encounter Note:    Surgical AVR plan   Warfarin start date: 01-06-2019   HVI Staff: Posey Pronto (with fellow)  DX : On-X AVR   INR Range: 1.5 - 2.0  Tab strength: warfarin '2mg'$    Contact 1: [phone] cell   Contact 2:   Lab: POC/[place] - [facility]   Comments: Tylenol regularly    HVI anticoag SCN: Tammi Sou RN ext [phone]    INR: 1.5  Current Dose: 6 mg Thursday, 4 mg all other days  Clinical Outcomes       Positives:  INR In Range            Plan: 6 mg Thursday, 4 mg all other days; recheck in 2 weeks.  Confirmed dosing and testing with patient via MyChart message.    Next INR Date: 11.22.23    Erie Noe, RN SCN  [facility]  Ext. [phone]   22      For additional information, see dosing calendar below.      Warfarin Therapy Instructions   October 2023 Details    Sun Molli Knock Tue Wed Thu Fri Sat     '1               2               3               4               5               6               7                 8               9               10               11               12               13               14                 15               16               17               18               19               20               21                 22               23               24               25               26      6 '$ mg   See details      27  4 mg         28      4 mg           29      4 mg         30      4 mg         31      4 mg              Date Details   10/26 Last INR check                Warfarin Therapy Instructions   November 2023 Details    Sun Mon Tue Wed Thu Fri Sat        1      4 mg         2      6 mg         3      4 mg         4      4 mg           5      4 mg         6      4 mg         7      4 mg         8   1.5   4 mg   See details      9      6 mg         10      4 mg         11      4 mg           12      4 mg         13      4 mg         14      4 mg         15      4 mg         16      6 mg         17      4 mg         18      4 mg           19      4 mg         20      4  mg         21      4 mg         22      4 mg         '23               24               25                 26               27               28               29               '$ 30  Date Details   11/08 This INR check     INR: 1.5       Date of next INR:  09/09/2022

## 2022-09-08 ENCOUNTER — Encounter (HOSPITAL_COMMUNITY): Payer: Self-pay

## 2022-09-08 ENCOUNTER — Ambulatory Visit (HOSPITAL_COMMUNITY): Payer: Self-pay

## 2022-09-08 LAB — PT/INR: INR: 1.8

## 2022-09-08 NOTE — Progress Notes (Signed)
Anticoagulation Encounter Note:    Surgical AVR plan   Warfarin start date: 01-06-2019   HVI Staff: Posey Pronto (with fellow)  DX : On-X AVR   INR Range: 1.5 - 2.0  Tab strength: warfarin '2mg'$    Contact 1: [phone] cell   Contact 2:   Lab: POC/[place] - [facility]   Comments: Tylenol regularly    HVI anticoag SCN: Tammi Sou RN ext [phone]    INR: 1.8  Current Dose: 6 mg Thursday, 4 mg all other days  Clinical Outcomes       Positives:  INR In Range          Patient Findings       Positives:  Change in health (cough/congestion), Change in medications (Tylenol and Benadryl for cough/congestion)          Plan: 6 mg Thursday, 4 mg all other days; recheck in two weeks. Confirmed dosing and testing with patient via MyChart message.      Next INR Date: 12.7.23    Erie Noe, RN SCN  [facility]  Ext. [phone]         For additional information, see dosing calendar below.      Warfarin Therapy Instructions   November 2023 Details    Sun Molli Knock Tue Wed Thu Fri Sat        '1               2               3               4                 5               6               7               8   '$ 1.5   4 mg   See details      9      6 mg         10      4 mg         11      4 mg           12      4 mg         13      4 mg         14      4 mg         15      4 mg         16      6 mg         17      4 mg         18      4 mg           19      4 mg         20      4 mg         21   1.8   4 mg   See details      22      4 mg         23      6 mg         24  4 mg         25      4 mg           26      4 mg         27      4 mg         28      4 mg         29      4 mg         30      6 mg            Date Details   11/08 Last INR check     INR: 1.5      11/21 This INR check     INR: 1.8                Warfarin Therapy Instructions   December 2023 Details    Sun Mon Tue Wed Thu Fri Sat          1      4 mg         2      4 mg           3      4 mg         4      4 mg         5      4 mg         6      4 mg         7      6 mg         '8                9                 10               11               12               13               14               15               16                 17               18               19               20               21               22               23                 24               25               26               '$ 27  $'28               29               30                 31                      'g$ Date Details   No additional details    Date of next INR:  09/24/2022

## 2022-09-15 ENCOUNTER — Ambulatory Visit (HOSPITAL_BASED_OUTPATIENT_CLINIC_OR_DEPARTMENT_OTHER): Payer: Medicare Other | Admitting: Student in an Organized Health Care Education/Training Program

## 2022-09-17 ENCOUNTER — Encounter (HOSPITAL_BASED_OUTPATIENT_CLINIC_OR_DEPARTMENT_OTHER): Payer: Self-pay | Admitting: Student in an Organized Health Care Education/Training Program

## 2022-09-17 ENCOUNTER — Inpatient Hospital Stay (HOSPITAL_BASED_OUTPATIENT_CLINIC_OR_DEPARTMENT_OTHER)
Admission: RE | Admit: 2022-09-17 | Discharge: 2022-09-17 | Disposition: A | Discharge status: Home / Self Care | Payer: Medicare Other | Source: Ambulatory Visit | Attending: Student in an Organized Health Care Education/Training Program | Admitting: Student in an Organized Health Care Education/Training Program

## 2022-09-17 ENCOUNTER — Ambulatory Visit
Payer: Medicare Other | Attending: Student in an Organized Health Care Education/Training Program | Admitting: Student in an Organized Health Care Education/Training Program

## 2022-09-17 ENCOUNTER — Other Ambulatory Visit: Payer: Self-pay

## 2022-09-17 VITALS — BP 126/74 | HR 82 | Temp 96.8°F | Ht 65.0 in | Wt 220.9 lb

## 2022-09-17 DIAGNOSIS — Z7901 Long term (current) use of anticoagulants: Secondary | ICD-10-CM | POA: Insufficient documentation

## 2022-09-17 DIAGNOSIS — J40 Bronchitis, not specified as acute or chronic: Secondary | ICD-10-CM | POA: Insufficient documentation

## 2022-09-17 DIAGNOSIS — J069 Acute upper respiratory infection, unspecified: Secondary | ICD-10-CM

## 2022-09-17 NOTE — Progress Notes (Signed)
CLINIC PROGRESS NOTE  FAMILY MEDICINE, [facility]  [place] [address]    [name]  September 14, 1956  [id number]    Date of Service: 09/17/2022 10:40 AM EST    Chief complaint:   Chief Complaint   Patient presents with    Cough    Sore Throat     Subjective:   [name] is a 66 y.o. female patient who presents for concerns for sore throat and congestion, cough.     -headache started on Nov 10th   -started having sinus drainage, sore throat and cough about 2 weeks ago, cough has been intermittently productive  -took COVID test at home on 11/14 which was negative   -no fevers, chills. Does have some mild nausea but denies vomiting or diarrhea   -has tried benadryl, Claritin, cough drops, warm salt water gargles, with improvement   -overall feels that cough is much better but just wanted to get checked out today   -penicillin allergy     REVIEW OF SYSTEMS:  ROS as above.    Health Maintenance Due   Topic Date Due    Shingles Vaccine (1 of 2) Never done    Influenza Vaccine (1) 06/19/2022    Covid-19 Vaccine (5 - 2023-24 season) 06/19/2022     Current Outpatient Medications   Medication Sig    acetaminophen (TYLENOL) 500 mg Oral Tablet Take 650 mg by mouth Every night Taking 2 daily    ascorbic acid, vitamin C, (VITAMIN C) 500 mg Oral Tablet Take 0.5 Tablets (250 mg total) by mouth Once a day    aspirin 81 mg Oral Tablet, Chewable Take 1 Tablet (81 mg total) by mouth Once a day    Bifidobacterium infantis (ALIGN ORAL) Take by mouth (Patient not taking: Reported on 09/17/2022)    Cholecalciferol, Vitamin D3, 50 mcg (2,000 unit) Oral Capsule Take 2,000 Units by mouth Once a day     clobetasoL (TEMOVATE) 0.05 % Cream Apply topically Twice daily    clotrimazole (LOTRIMIN) 1 % Cream APPLY CREAM TOPICALLY TO AFFECTED AREA TWICE DAILY FOR 10 DAYS    fluconazole (DIFLUCAN) 200 mg Oral Tablet Take one tab PO today and repeat in 72 hours (Patient not taking: Reported on 08/12/2022)     Fluocinolone-Shower Cap 0.01 % Oil Apply to scalp 1-2 times weekly at night.    hydroCHLOROthiazide (HYDRODIURIL) 25 mg Oral Tablet Take 1 Tablet (25 mg total) by mouth Once a day    loratadine (CLARITIN) 10 mg Oral Tablet Take 1 Tablet (10 mg total) by mouth Once a day    metoprolol succinate (TOPROL-XL) 50 mg Oral Tablet Sustained Release 24 hr Take 1 Tablet (50 mg total) by mouth Once a day    triamcinolone acetonide (ARISTOCORT A) 0.1 % Cream Apply topically Twice daily    warfarin (COUMADIN) 2 mg Oral Tablet Take 3 Tablets (6 mg total) by mouth Every evening Or as directed by Anticoag clinic       Objective:   BP 126/74   Pulse 82   Temp 36 C (96.8 F)   Ht 1.651 m ('5\' 5"'$ )   Wt 100 kg (220 lb 14.4 oz)   SpO2 97%   BMI 36.76 kg/m     General appearance: alert, no acute distress  HEENT: tympanic membranes non erythematous, non-bulging. No lymphadenopathy. Oral and nasal mucosa normal   Lungs: clear to auscultation bilaterally, no wheezes or crackles  Heart: RRR, no murmur appreciated  Abdomen: soft,  non-tender, non-distended, BS+, no guarding  Extremities: atraumatic, no edema  Neuro: grossly normal, gait steady    Assessment/Plan     ENCOUNTER DIAGNOSES     ICD-10-CM   1. Upper respiratory tract infection, unspecified type  J06.9   2. Bronchitis  J40     Will order chest xray to rule out pneumonia. Discussed starting antibiotics now, however patient has been suffering with ongoing yeast/fungal infection in the groin/genital area for the last few months after taking penicillins. Has been working with her PCP on this, but would be hesitant to start an antibiotic due to these symptoms.   Will hold off on antibiotic for now, continue conservative management with plenty of fluids, rest, Claritin. Could consider nasal spray, however patient has intermittent nose bleeds and is on Coumadin so will hold off on nose spray for now.     If chest xray suggestive of pneumonia, will start antibiotic at that time.   ED  precautions discussed with patient. Patient also to let me know if symptoms worsen or if she develops any other worrisome symptoms.     Orders Placed This Encounter    XR CHEST PA AND LATERAL     The patient was given the opportunity to ask questions and those questions were answered to the patient's satisfaction. The patient was encouraged to call with any additional questions or concerns. Instructed patient to call back if symptoms worsen.   Discussed with patient effects and side effects of medications. Medication safety was discussed. A copy of the patient's medication list was printed and given to the patient. A good faith effort was made to reconcile the patient's medications.   This note may have been partially generated using MModal Fluency Direct system and there may be some incorrect words, spellings, and punctuation that were not noted in checking the note before saving.     Follow up:  Return in about 4 weeks (around 10/15/2022) for URI follow up .      [place], DO   09/17/2022, 10:40

## 2022-09-18 DIAGNOSIS — J069 Acute upper respiratory infection, unspecified: Secondary | ICD-10-CM

## 2022-09-24 ENCOUNTER — Ambulatory Visit (HOSPITAL_COMMUNITY): Payer: Self-pay

## 2022-09-24 ENCOUNTER — Encounter (HOSPITAL_COMMUNITY): Payer: Self-pay

## 2022-09-24 LAB — PT/INR: INR: 1.9

## 2022-09-24 NOTE — Progress Notes (Signed)
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   Confirmed dosing and testing with patient via MyChart message.    Next INR Date: 12.14.23    Erie Noe, RN SCN  [facility]  Ext. [phone]          For additional information, see dosing calendar below.      Warfarin Therapy Instructions   November 2023 Details    Sun [name] Thu Fri Sat        '1               2               3               4                 5               6               7               8               9               10               11                 12               13               14               15               16               17               18                 19               20               21   '$ 1.8   4 mg   See details      22      4 mg         23      6 mg         24      4 mg         25      4 mg           26      4 mg  27      4 mg         28      4 mg         29      4 mg         30      6 mg            Date Details   11/21 Last INR check     INR: 1.8                Warfarin Therapy Instructions   December 2023 Details    Sun Mon Tue Wed Thu Fri Sat          1      4 mg         2      4 mg           3      4 mg         4      4 mg         5      4 mg         6      4 mg         7   1.9   6 mg   See details      8      4 mg         9      4 mg           10      4 mg         11      4 mg         12      4 mg         13      4 mg         14      6 mg         15      4 mg         16      4 mg           17      4 mg         18      4 mg         19      4  mg         20      4 mg         21      6 mg         '22               23                 24               25               26               27               28               29               '$ 30  31                      Date Details   12/07 This INR check     INR: 1.9       Date of next INR:  10/08/2022

## 2022-10-08 ENCOUNTER — Encounter (HOSPITAL_COMMUNITY): Payer: Self-pay

## 2022-10-08 ENCOUNTER — Ambulatory Visit (HOSPITAL_COMMUNITY): Payer: Self-pay

## 2022-10-08 LAB — PT/INR: INR: 1.8

## 2022-10-08 NOTE — Progress Notes (Signed)
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 Confirmed dosing and testing with patient via MyChart message.    Next INR Date: 1.4.24    Erie Noe, RN SCN  [facility]  Ext. [phone]         For additional information, see dosing calendar below.      Warfarin Therapy Instructions   December 2023 Details    Sun [name] Thu Fri Sat          '1               2                 3               4               5               6               7   '$ 1.9   6 mg   See details      8      4 mg         9      4 mg           10      4 mg         11      4 mg         12      4 mg         13      4 mg         14      6 mg         15      4 mg         16      4 mg           17      4 mg         18      4 mg         19      4 mg         20      4 mg         21   1.8   6 mg   See details      22      4 mg         23      4 mg           24      4 mg         25      4 mg  26      4 mg         27      4 mg         28      6 mg         29      4 mg         30      4 mg           31      4 mg                Date Details   12/07 Last INR check     INR: 1.9      12/21 This INR check     INR: 1.8                Warfarin Therapy Instructions   January 2024 Details    Sun [name] Thu Fri Sat      1      4 mg         2      4 mg         3      4 mg         4      6 mg         '5               6                 7               8               9               10               11               12               13                 14               15               16                17               18               19               20                 21               22               23               24               25               26               27                 '$ 28  $'29               30               31                   'e$ Date Details   No additional details    Date of next INR:  10/22/2022

## 2022-10-20 ENCOUNTER — Encounter (HOSPITAL_BASED_OUTPATIENT_CLINIC_OR_DEPARTMENT_OTHER): Payer: Self-pay | Admitting: Student in an Organized Health Care Education/Training Program

## 2022-10-20 ENCOUNTER — Encounter (INDEPENDENT_AMBULATORY_CARE_PROVIDER_SITE_OTHER): Payer: Self-pay

## 2022-10-20 ENCOUNTER — Ambulatory Visit
Payer: Medicare Other | Attending: Student in an Organized Health Care Education/Training Program | Admitting: Student in an Organized Health Care Education/Training Program

## 2022-10-20 ENCOUNTER — Other Ambulatory Visit: Payer: Self-pay

## 2022-10-20 VITALS — BP 111/78 | HR 82 | Temp 96.7°F | Wt 220.9 lb

## 2022-10-20 DIAGNOSIS — N760 Acute vaginitis: Secondary | ICD-10-CM | POA: Insufficient documentation

## 2022-10-20 DIAGNOSIS — J3089 Other allergic rhinitis: Secondary | ICD-10-CM | POA: Insufficient documentation

## 2022-10-20 MED ORDER — MONTELUKAST 10 MG TABLET
10.0000 mg | ORAL_TABLET | Freq: Every evening | ORAL | 0 refills | Status: DC
Start: 2022-10-20 — End: 2022-11-26

## 2022-10-20 NOTE — Progress Notes (Signed)
CLINIC PROGRESS NOTE  FAMILY MEDICINE, [facility]  [place] [address]    [name]  16-May-1956  [id number]    Date of Service: 10/20/2022 11:20 AM EST    Chief complaint:   Chief Complaint   Patient presents with    URI     Follow Up    Not getting better        Subjective:   [name] is a 67 y.o. female patient who presents for URI follow up.   Has continued to have sinus congestion, runny nose. Symptoms have been waxing and waning.  She also continues to have some mild cough.  Denies any fevers or chills.  Symptoms were initially improved but now they have become persistent.  Drainage has all been clear.    Also had some bright red blood from anus region about 2 days ago. Painless in that area, but has had some itching around the anus region. Has continued to have some intermittent bright red blood per rectum, but is not always associated with bowel movements.  She also has continued vaginal and groin irritation and believes this is associated with the blood. Has tried diaper rash cream, fungal cream, psoriasis medications and clobetasol.  Nothing has helped, she has not used clobetasol regularly.  She does have an appointment with Dermatology upcoming.    REVIEW OF SYSTEMS:  ROS as above.    Health Maintenance Due   Topic Date Due    Shingles Vaccine (1 of 2) Never done    Influenza Vaccine (1) 06/19/2022    Covid-19 Vaccine (5 - 2023-24 season) 06/19/2022       Current Outpatient Medications   Medication Sig    acetaminophen (TYLENOL) 500 mg Oral Tablet Take 650 mg by mouth Every night Taking 2 daily    ascorbic acid, vitamin C, (VITAMIN C) 500 mg Oral Tablet Take 0.5 Tablets (250 mg total) by mouth Once a day    aspirin 81 mg Oral Tablet, Chewable Take 1 Tablet (81 mg total) by mouth Once a day    Bifidobacterium infantis (ALIGN ORAL) Take by mouth (Patient not taking: Reported on 09/17/2022)    Cholecalciferol, Vitamin D3, 50 mcg (2,000 unit) Oral Capsule Take  2,000 Units by mouth Once a day     clobetasoL (TEMOVATE) 0.05 % Cream Apply topically Twice daily    clotrimazole (LOTRIMIN) 1 % Cream APPLY CREAM TOPICALLY TO AFFECTED AREA TWICE DAILY FOR 10 DAYS    fluconazole (DIFLUCAN) 200 mg Oral Tablet Take one tab PO today and repeat in 72 hours (Patient not taking: Reported on 08/12/2022)    Fluocinolone-Shower Cap 0.01 % Oil Apply to scalp 1-2 times weekly at night.    hydroCHLOROthiazide (HYDRODIURIL) 25 mg Oral Tablet Take 1 Tablet (25 mg total) by mouth Once a day    loratadine (CLARITIN) 10 mg Oral Tablet Take 1 Tablet (10 mg total) by mouth Once a day    metoprolol succinate (TOPROL-XL) 50 mg Oral Tablet Sustained Release 24 hr Take 1 Tablet (50 mg total) by mouth Once a day    montelukast (SINGULAIR) 10 mg Oral Tablet Take 1 Tablet (10 mg total) by mouth Every evening for 30 days    triamcinolone acetonide (ARISTOCORT A) 0.1 % Cream Apply topically Twice daily    warfarin (COUMADIN) 2 mg Oral Tablet Take 3 Tablets (6 mg total) by mouth Every evening Or as directed by Anticoag clinic       Objective:  BP 111/78   Pulse 82   Temp (!) 35.9 C (96.7 F)   Wt 100 kg (220 lb 14.4 oz)   SpO2 96%   BMI 36.76 kg/m     General appearance: alert, no acute distress  HEENT: tympanic membranes non erythematous, non-bulging. No lymphadenopathy. Oral and nasal mucosa normal   Lungs: clear to auscultation bilaterally, no wheezes or crackles  Heart: RRR, no murmur appreciated  Abdomen: soft, non-tender, non-distended, BS+, no guarding  Extremities: atraumatic, no edema  Neuro: grossly normal, gait steady    Assessment/Plan     ENCOUNTER DIAGNOSES     ICD-10-CM   1. Environmental and seasonal allergies  J30.89   2. Vaginitis  N76.0     Seasonal allergies   -discussed with patient that due to chronicity of symptoms and negative chest x-ray, symptoms possibly related to seasonal allergies   -will try adding Singulair to see if this helps with symptom    Vaginitis   -concern for  possible lichen sclerosus or lichen planus   -patient to use barrier creams only for the next 4 weeks   -will plan for pelvic exam at follow-up visit  -may benefit from daily use of clobetasol      Orders Placed This Encounter    montelukast (SINGULAIR) 10 mg Oral Tablet         The patient was given the opportunity to ask questions and those questions were answered to the patient's satisfaction. The patient was encouraged to call with any additional questions or concerns. Instructed patient to call back if symptoms worsen.   Discussed with patient effects and side effects of medications. Medication safety was discussed. A copy of the patient's medication list was printed and given to the patient. A good faith effort was made to reconcile the patient's medications.   This note may have been partially generated using MModal Fluency Direct system and there may be some incorrect words, spellings, and punctuation that were not noted in checking the note before saving.     Follow up:  Return in about 4 weeks (around 11/17/2022) for vaginitis follow up .      [place], DO   10/20/2022, 11:11

## 2022-10-22 ENCOUNTER — Ambulatory Visit (HOSPITAL_COMMUNITY): Payer: Self-pay

## 2022-10-22 ENCOUNTER — Encounter (HOSPITAL_COMMUNITY): Payer: Self-pay

## 2022-10-22 LAB — PT/INR: INR: 1.5

## 2022-10-22 NOTE — Progress Notes (Signed)
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 Confirmed dosing and testing with patient via MyChart message.    Next INR Date: 1.18.24    Erie Noe, RN SCN  [facility]  Ext. [phone]         For additional information, see dosing calendar below.      Warfarin Therapy Instructions   December 2023 Details    Sun [name] Thu Fri Sat          '1               2                 3               4               5               6               7               8               9                 10               11               12               13               14               15               16                 17               18               19               20               21   '$ 1.8   6 mg   See details      22      4 mg         23      4 mg           24      4 mg         25      4 mg         26  4 mg         27      4 mg         28      6 mg         29      4 mg         30      4 mg           31      4 mg                Date Details   12/21 Last INR check     INR: 1.8                Warfarin Therapy Instructions   January 2024 Details    Sun Mon Tue Wed Thu Fri Sat      1      4 mg         2      4 mg         3      4 mg         4   1.5   8 mg   See details      5      4 mg         6      4 mg           7      4 mg         8      4 mg         9      4 mg         10      4 mg         11      6 mg         12      4 mg         13      4 mg           14      4 mg         15      4 mg         16      4 mg         17      4  mg         18      6 mg         '19               20                 21               22               23               24               25               26               27                 28               '$ 29  30               31                   Date Details   01/04 This INR check     INR: 1.5       Date of next INR:  11/05/2022

## 2022-11-05 ENCOUNTER — Encounter (HOSPITAL_COMMUNITY): Payer: Self-pay

## 2022-11-05 ENCOUNTER — Ambulatory Visit (HOSPITAL_COMMUNITY): Payer: Self-pay

## 2022-11-05 LAB — PT/INR: INR: 1.4

## 2022-11-05 NOTE — Progress Notes (Signed)
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 Confirmed dosing and testing with patient via [facility].  Next INR Date: 2.15.24    Erie Noe, RN SCN  [facility]  Ext. [phone]         For additional information, see dosing calendar below.      Warfarin Therapy Instructions   January 2024 Details    Sun [name] Thu Fri Sat      '1               2               3               4   '$ 1.5   8 mg   See details      5      4 mg         6      4 mg           7      4 mg         8      4 mg         9      4 mg         10      4 mg         11      6 mg         12      4 mg         13      4 mg           14      4 mg         15      4 mg         16      4 mg         17      4 mg         18   1.4   8 mg   See details      19      4 mg         20      4 mg           21      4 mg         22      6 mg         23  4 mg         24      4 mg         25      6 mg         26      4 mg         27      4 mg           28      4 mg         29      6 mg         30      4 mg         31      4 mg             Date Details   01/04 Last INR check     INR: 1.5      01/18 This INR check     INR: 1.4                Warfarin Therapy Instructions   February 2024 Details    Sun [name] Thu Fri Sat         1      6 mg         '2               3                 4               5                6               7               8               9               10                 11               12               13               14               15               16               17                 18               19               20               21               22               23               24                 25               '$ 26  $'27               28               29                  'p$ Date Details   No additional details    Date of next INR:  11/19/2022

## 2022-11-19 ENCOUNTER — Ambulatory Visit (HOSPITAL_COMMUNITY): Payer: Self-pay

## 2022-11-19 ENCOUNTER — Encounter (HOSPITAL_COMMUNITY): Payer: Self-pay

## 2022-11-19 LAB — PT/INR: INR: 1.8

## 2022-11-19 NOTE — Progress Notes (Signed)
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 Confirmed dosing and testing with patient via MyChart message.    Next INR Date: 2.15.24    Erie Noe, RN SCN  [facility]  Ext. [phone]         For additional information, see dosing calendar below.      Warfarin Therapy Instructions   January 2024 Details    Sun [name] Thu Fri Sat      '1               2               3               4               5               6                 7               8               9               10               11               12               13                 14               15               16               17               18   '$ 1.4   8 mg   See details      19      4 mg         20      4 mg           21      4 mg         22      6 mg         23      4 mg         24      4 mg         25      6 mg         26      4 mg  27      4 mg           28      4 mg         29      6 mg         30      4 mg         31      4 mg             Date Details   01/18 Last INR check     INR: 1.4              Warfarin Therapy Instructions   February 2024 Details    Sun Mon Tue Wed Thu Fri Sat         1   1.8   6 mg   See details      2      4 mg         3      4 mg           4      4 mg         5      6 mg         6      4 mg         7      4 mg         8      6 mg         9      4 mg         10      4 mg           11      4 mg         12      6 mg         13      4 mg         14      4 mg         15      6 mg         '16               17                 18               19                20               21               22               23               24                 25               26               27               28               '$ 29  Date Details   02/01 This INR check     INR: 1.8       Date of next INR:  12/03/2022

## 2022-11-26 ENCOUNTER — Encounter (HOSPITAL_BASED_OUTPATIENT_CLINIC_OR_DEPARTMENT_OTHER): Payer: Self-pay | Admitting: Family

## 2022-11-26 ENCOUNTER — Other Ambulatory Visit: Payer: Self-pay

## 2022-11-26 ENCOUNTER — Ambulatory Visit: Payer: Medicare Other | Attending: Family | Admitting: Family

## 2022-11-26 VITALS — BP 122/78 | HR 79 | Temp 96.8°F | Ht 65.5 in | Wt 220.2 lb

## 2022-11-26 DIAGNOSIS — Z131 Encounter for screening for diabetes mellitus: Secondary | ICD-10-CM | POA: Insufficient documentation

## 2022-11-26 DIAGNOSIS — Z13220 Encounter for screening for lipoid disorders: Secondary | ICD-10-CM | POA: Insufficient documentation

## 2022-11-26 DIAGNOSIS — R21 Rash and other nonspecific skin eruption: Secondary | ICD-10-CM | POA: Insufficient documentation

## 2022-11-26 DIAGNOSIS — Z1231 Encounter for screening mammogram for malignant neoplasm of breast: Secondary | ICD-10-CM | POA: Insufficient documentation

## 2022-11-26 DIAGNOSIS — D229 Melanocytic nevi, unspecified: Secondary | ICD-10-CM | POA: Insufficient documentation

## 2022-11-26 NOTE — Progress Notes (Signed)
[facility]: Family Medicine Office Visit  Progress Note    [name]  [id number]  04/17/1956    Date of Service:  11/26/2022     Chief Complaint   Patient presents with    Follow Up       History of Present Illness:  Patient is presenting today for follow-up of rashes started in August.  We initially thought it was an allergy to penicillin, as it started after she took an antibiotic prior to a dental cleaning due to history of hip replacement.  The rash is located mostly to the trunk, it originally started in the back, directly above her bra line.  It can be pruritic at times.  It is never really completely resolved her gone away.    Patient is also requesting an order for her mammogram and labs so she can have them prior to her next appointment in May for her Medicare wellness    Patient Active Problem List   Diagnosis    Essential hypertension    S/P hip replacement    Low back pain radiating to left leg    Severe aortic stenosis    S/P AVR (aortic valve replacement)    Osteoarthritis of right hip    Psoriasis of scalp    Hx of inverse psoriasis    Postoperative atrial fibrillation (CMS HCC)    Primary osteoarthritis of right hip       Medication list:   acetaminophen (TYLENOL) 500 mg Oral Tablet Take 650 mg by mouth Every night Taking 2 daily    ascorbic acid, vitamin C, (VITAMIN C) 500 mg Oral Tablet Take 0.5 Tablets (250 mg total) by mouth Once a day    aspirin 81 mg Oral Tablet, Chewable Take 1 Tablet (81 mg total) by mouth Once a day    Bifidobacterium infantis (ALIGN ORAL) Take by mouth (Patient not taking: Reported on 09/17/2022)    Cholecalciferol, Vitamin D3, 50 mcg (2,000 unit) Oral Capsule Take 2,000 Units by mouth Once a day     clobetasoL (TEMOVATE) 0.05 % Cream Apply topically Twice daily    clotrimazole (LOTRIMIN) 1 % Cream APPLY CREAM TOPICALLY TO AFFECTED AREA TWICE DAILY FOR 10 DAYS (Patient not taking: Reported on 11/26/2022)    Fluocinolone-Shower Cap 0.01 % Oil Apply to scalp 1-2  times weekly at night.    hydroCHLOROthiazide (HYDRODIURIL) 25 mg Oral Tablet Take 1 Tablet (25 mg total) by mouth Once a day    loratadine (CLARITIN) 10 mg Oral Tablet Take 1 Tablet (10 mg total) by mouth Once a day    metoprolol succinate (TOPROL-XL) 50 mg Oral Tablet Sustained Release 24 hr Take 1 Tablet (50 mg total) by mouth Once a day    triamcinolone acetonide (ARISTOCORT A) 0.1 % Cream Apply topically Twice daily    warfarin (COUMADIN) 2 mg Oral Tablet Take 3 Tablets (6 mg total) by mouth Every evening Or as directed by Anticoag clinic        Allergies   Allergen Reactions    Iv Contrast Anaphylaxis    Penicillin Itching, Diarrhea and Hives/ Urticaria    Oregano      Hives      Stadol [Butorphanol Tartrate] Mental Status Effect    Tomato Hives/ Urticaria       Review of Systems   Skin:  Positive for rash.       Physical Exam:  BP 122/78   Pulse 79   Temp 36 C (96.8 F)  Ht 1.664 m (5' 5.5")   Wt 99.9 kg (220 lb 3.8 oz)   SpO2 96%   BMI 36.09 kg/m       General:  appears in good health, comfortable and no distress  Skin:  Maculopapular rash scattered, mostly to trunk and upper extremities.        ICD-10-CM    1. Rash  R21 CBC/DIFF      2. Encounter for screening mammogram for malignant neoplasm of breast  Z12.31 MAMMO BILATERAL SCREENING-ADDL VIEWS/BREAST US AS REQ BY RAD      3. Changing nevus  D22.9       4. Screening for diabetes mellitus (DM)  123XX123 BASIC METABOLIC PANEL, FASTING      5. Screening for cholesterol level  Z13.220 Lipid Panel        1. Rash.  She has established care with Dermatology, her next appointment is in April, I advised for her to follow up sooner given that the rash has been present since August.  Patient also has a changing, nonhealing lesion to her lateral right lower extremity they can also evaluate.    Return in about 3 months (around 02/24/2023) for [facility] wellness.    Derek Jack, APRN,FNP-BC  [facility]  FAMILY MED-[facility]AT  [address]  [place], [place]  [address]  [phone]

## 2022-11-27 ENCOUNTER — Inpatient Hospital Stay
Admission: RE | Admit: 2022-11-27 | Discharge: 2022-11-27 | Disposition: A | Discharge status: Home / Self Care | Payer: Medicare Other | Source: Ambulatory Visit | Attending: Family | Admitting: Family

## 2022-11-27 ENCOUNTER — Encounter (HOSPITAL_BASED_OUTPATIENT_CLINIC_OR_DEPARTMENT_OTHER): Payer: Self-pay

## 2022-11-27 DIAGNOSIS — Z1231 Encounter for screening mammogram for malignant neoplasm of breast: Secondary | ICD-10-CM

## 2022-12-01 ENCOUNTER — Other Ambulatory Visit: Payer: Self-pay

## 2022-12-01 ENCOUNTER — Encounter (HOSPITAL_BASED_OUTPATIENT_CLINIC_OR_DEPARTMENT_OTHER): Payer: Self-pay | Admitting: GENERAL PRACTICE

## 2022-12-01 ENCOUNTER — Ambulatory Visit (HOSPITAL_BASED_OUTPATIENT_CLINIC_OR_DEPARTMENT_OTHER): Payer: Medicare Other | Admitting: GENERAL PRACTICE

## 2022-12-01 ENCOUNTER — Ambulatory Visit: Payer: Medicare Other | Attending: Family | Admitting: GENERAL PRACTICE

## 2022-12-01 VITALS — Temp 98.5°F | Ht 65.5 in | Wt 219.8 lb

## 2022-12-01 DIAGNOSIS — D485 Neoplasm of uncertain behavior of skin: Secondary | ICD-10-CM

## 2022-12-01 DIAGNOSIS — L929 Granulomatous disorder of the skin and subcutaneous tissue, unspecified: Secondary | ICD-10-CM | POA: Insufficient documentation

## 2022-12-01 DIAGNOSIS — D489 Neoplasm of uncertain behavior, unspecified: Secondary | ICD-10-CM | POA: Insufficient documentation

## 2022-12-01 DIAGNOSIS — R21 Rash and other nonspecific skin eruption: Secondary | ICD-10-CM

## 2022-12-01 DIAGNOSIS — Z85828 Personal history of other malignant neoplasm of skin: Secondary | ICD-10-CM

## 2022-12-01 MED ORDER — LIDOCAINE 20 MG/ML (2 %)-EPINEPHRINE 1:100,000 INJECTION SOLUTION
1.0000 mL | Freq: Once | INTRAMUSCULAR | Status: AC
Start: 2022-12-01 — End: 2022-12-01
  Administered 2022-12-01: 20 mg via INTRADERMAL

## 2022-12-01 MED ORDER — TRIAMCINOLONE ACETONIDE 0.1 % TOPICAL OINTMENT
TOPICAL_OINTMENT | Freq: Two times a day (BID) | CUTANEOUS | 2 refills | Status: DC | PRN
Start: 2022-12-01 — End: 2023-12-13

## 2022-12-01 MED ORDER — MUPIROCIN 2 % TOPICAL OINTMENT
TOPICAL_OINTMENT | Freq: Three times a day (TID) | CUTANEOUS | 2 refills | Status: DC
Start: 2022-12-01 — End: 2023-12-22

## 2022-12-01 NOTE — Progress Notes (Signed)
Dermatology, [facility]  [address]  [place] [address]  [phone]    Date:   12/01/2022  Name: [name]  Age: 67 y.o.    Chief complaint: Skin lesions    HPI  [name] is a 67 y.o. 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 Itches. She also has a tender, non-healing area on the left lateral lower leg. No weight loss, fevers, chills. LAD. Had a viral cough, which resolved a few months ago. No sore throat. No fevers. No hx strep throat recently. Does have hx scalp psoriasis.     Otherwise, no new, changing, painful/pruritic, bleeding or enlarging skin lesions. No additional skin-related concerns.         Review of Systems   Constitutional:  Negative for chills and fever.   Skin:  Positive for itching and rash.     Current Medications   acetaminophen (TYLENOL) 500 mg Oral Tablet Take 650 mg by mouth Every night Taking 2 daily    ascorbic acid, vitamin C, (VITAMIN C) 500 mg Oral Tablet Take 0.5 Tablets (250 mg total) by mouth Once a day    aspirin 81 mg Oral Tablet, Chewable Take 1 Tablet (81 mg total) by mouth Once a day    Bifidobacterium infantis (ALIGN ORAL) Take by mouth    Cholecalciferol, Vitamin D3, 50 mcg (2,000 unit) Oral Capsule Take 2,000 Units by mouth Once a day     clobetasoL (TEMOVATE) 0.05 % Cream Apply topically Twice daily    Fluocinolone-Shower Cap 0.01 % Oil Apply to scalp 1-2 times weekly at night.    hydroCHLOROthiazide (HYDRODIURIL) 25 mg Oral Tablet Take 1 Tablet (25 mg total) by mouth Once a day    loratadine (CLARITIN) 10 mg Oral Tablet Take 1 Tablet (10 mg total) by mouth Once a day    metoprolol succinate (TOPROL-XL) 50 mg Oral Tablet Sustained Release 24 hr Take 1 Tablet (50 mg total) by mouth Once a day    triamcinolone acetonide (ARISTOCORT A) 0.1 % Cream Apply topically Twice daily    warfarin (COUMADIN) 2 mg Oral Tablet Take 3 Tablets (6 mg total) by mouth  Every evening Or as directed by Anticoag clinic     Allergies   Allergen Reactions    Iv Contrast Anaphylaxis    Penicillin Itching, Diarrhea and Hives/ Urticaria    Oregano      Hives      Stadol [Butorphanol Tartrate] Mental Status Effect    Tomato Hives/ Urticaria     Past Medical History:   Diagnosis Date    Abdominal hernia 04/29/2020    hx of 3 repairs    Anemia     Aortic stenosis     Arthritis     Atrial fibrillation (CMS HCC) 04/29/2020    one episode  after AVR    Back problem 04/29/2020    LBP    Beta blocker prescribed for left ventricular systolic dysfunction     Blood in stool     positive cologuard.  Has not yet scheduled colonoscopy    Blood thinned due to long-term anticoagulant use 04/29/2020    coumadin    Cancer (CMS HCC)     skin cancer L lip.  Needs MOHS to be scheduled    Chronic pain 04/29/2020    back and right hip    Colon polyp     Dysrhythmias  Heart murmur 01/04/2019    AVR    High blood pressure     Motion sickness     Obesity     Rash 04/29/2020    scalp psoriasis and involves R ear    Valvular disease     aortic valve stenosis, following with CT surgery, echo 02/2016 and cath 03/2016    Wears glasses          Physical Exam  Vitals: not currently breastfeeding.  Physical Exam  Constitutional:       General: She is not in acute distress.     Appearance: Normal appearance.   HENT:      Head:     Skin:     General: Skin is warm and dry.      Coloration: Skin is not pale.          Neurological:      Mental Status: She is alert and oriented to person, place, and time. Mental status is at baseline.      Coordination: Coordination normal.   Psychiatric:         Mood and Affect: Mood normal.   General skin exam was performed including head, neck, anterior/posterior trunk, bilateral upper & lower extremities and revealed no areas of concern other than those documented.    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  Rx triamcinolone ointment BID prn. Pt feels well. No LAD. No history of malignancy. Has an upcoming CBC/diff ordered. Most recent CBC/diff  unremarkable (July 20230). Discussed PLC can be associated with cutaneous lymphoma. She has not areas concerning for cutaneous lymphoma at present. Will follow.      #Neoplasm of uncertain behavior  (#4 on above diagram)  - PROCEDURE CPT CODE 62130 (-3 for each additional): Tangential biopsy was performed with intent to sample part of the lesion for diagnosis; it was explained that the remainder of the lesion was not removed from the patient. The consent form was thoroughly discussed and signed by the patient and/or primary care giver. The the patient was identified and procedure risk factors including bleeding, pain, and infection were discussed with patient and/or primary care giver. The site was cleaned and anesthetized. The biopsy was sent to pathology, and the bleeding was controlled. The wound was bandaged and wound care instructions were provided. There were no complications, and the patient tolerated the procedure well. It was advised that the patient return if future problems occur.   - Size: see above diagram  - Location of biopsy: right mid lateral lower leg   - Differential diagnosis: Folliculitis. r/o NMSC  - Patient will be called with pathology results and appropriate follow up will be made.  - Rx mupirocin ointment TID prn x14 days, then break.     RTC in 6 months or sooner if needed    Nonnie Done, MD 12/01/2022 14:10  Assistant Professor, Dermatology  [facility]

## 2022-12-03 ENCOUNTER — Encounter (HOSPITAL_COMMUNITY): Payer: Self-pay

## 2022-12-03 ENCOUNTER — Ambulatory Visit (HOSPITAL_COMMUNITY): Payer: Self-pay

## 2022-12-03 LAB — PT/INR: INR: 1.5

## 2022-12-03 NOTE — Progress Notes (Signed)
Anticoagulation Encounter Note:    Surgical AVR plan   Warfarin start date: 01-06-2019   [facility] Staff: Posey Pronto (with fellow)  DX : On-X AVR   INR Range: 1.5 - 2.0  Tab strength: warfarin 75m   Contact 1: [phone]   Contact 2:   Lab: POC/[facility] - [facility]   Comments: Tylenol regularly    [facility] anticoag SCN: STammi SouRN ext [phone]   INR: 1.5  Current Dose: 6 mg Th/M; 4 mg all other days  Clinical Outcomes       Positives:  INR In Range            Plan: 8 mg today then 6 mg M/Th, 4 mg all other days; recheck in two weeks. Confirmed dosing and testing with patient via MyChart message.    Next INR Date: 2.29.24    MErie Noe RN SCN  [facility]  Ext. [phone]        For additional information, see dosing calendar below.      Warfarin Therapy Instructions   February 2024 Details    Sun MGlori LuisThu Fri Sat         1   1.8   6 mg   See details      2      4 mg         3      4 mg           4      4 mg         5      6 mg         6      4 mg         7      4 mg         8      6 mg         9      4 mg         10      4 mg           11      4 mg         12      6 mg         13      4 mg         14      4 mg         15   1.5   8 mg   See details      16      4 mg         17      4 mg           18      4 mg         19      6 mg         20      4 mg         21      4 mg         22      6 mg         23      4 mg         24      4 mg           25  4 mg         26      6 mg         27      4 mg         28      4 mg         29      6 mg            Date Details   02/01 Last INR check     INR: 1.8      02/15 This INR check     INR: 1.5       Date of next INR:  12/17/2022

## 2022-12-10 ENCOUNTER — Encounter (HOSPITAL_COMMUNITY): Payer: Self-pay | Admitting: Hospitalist

## 2022-12-14 ENCOUNTER — Encounter (INDEPENDENT_AMBULATORY_CARE_PROVIDER_SITE_OTHER): Payer: Self-pay | Admitting: Ophthalmology

## 2022-12-16 ENCOUNTER — Ambulatory Visit (HOSPITAL_COMMUNITY): Payer: Self-pay

## 2022-12-16 ENCOUNTER — Encounter (HOSPITAL_COMMUNITY): Payer: Self-pay

## 2022-12-16 LAB — PT/INR: INR: 1.9

## 2022-12-16 NOTE — Progress Notes (Signed)
Anticoagulation Encounter Note:    Surgical AVR plan   Warfarin start date: 01-06-2019   [facility] Staff: Posey Pronto (with fellow)  DX : On-X AVR   INR Range: 1.5 - 2.0  Tab strength: warfarin '2mg'$    Contact 1: [phone] cell   Contact 2:   Lab: P[facility]/[place] - [facility]   Comments: Tylenol regularly    [facility] anticoag SCN: Tammi Sou RN ext [phone]    INR: 1.9  Current Dose: 6 mg M/Th, 4 mg all other days  Clinical Outcomes       Positives:  INR In Range            Plan: 6 mg M/Th, 4 mg all other days; recheck in 2 weeks. Confirmed dosing and testing with patient via MyChart message.    Next INR Date: 3.13.24    Erie Noe, RN SCN  [facility]  Ext. [phone]         For additional information, see dosing calendar below.      Warfarin Therapy Instructions   February 2024 Details    Sun [name] Thu Fri Sat         '1               2               3                 4               5               6               7               8               9               10                 11               12               13               14               15   '$ 1.5   8 mg   See details      16      4 mg         17      4 mg           18      4 mg         19      6 mg         20      4 mg         21      4 mg         22      6 mg         23      4 mg         24      4 mg           25      4 mg         26  6 mg         27      4 mg         28   1.9   4 mg   See details      29      6 mg            Date Details   02/15 Last INR check     INR: 1.5      02/28 This INR check     INR: 1.9                Warfarin Therapy Instructions   March 2024 Details    Sun [name] Thu Fri Sat          1      4 mg         2      4 mg           3      4 mg         4      6 mg         5      4 mg         6      4 mg         7      6 mg         8      4 mg         9      4 mg           10      4 mg         11      6 mg         12      4 mg         13      4 mg         '14               15               16                 17               18               19                20               21               22               23                 24               25               26               27               28               29               '$ 30  31                      Date Details   No additional details    Date of next INR:  12/30/2022

## 2022-12-18 ENCOUNTER — Encounter (HOSPITAL_BASED_OUTPATIENT_CLINIC_OR_DEPARTMENT_OTHER): Payer: Self-pay | Admitting: GENERAL PRACTICE

## 2022-12-31 ENCOUNTER — Encounter (HOSPITAL_COMMUNITY): Payer: Self-pay

## 2022-12-31 ENCOUNTER — Ambulatory Visit (HOSPITAL_COMMUNITY): Payer: Self-pay

## 2022-12-31 LAB — PT/INR: INR: 1.7

## 2022-12-31 NOTE — Progress Notes (Signed)
Anticoagulation Encounter Note:    Surgical AVR plan   Warfarin start date: 01-06-2019   [facility] Staff: Posey Pronto (with fellow)  DX : On-X AVR   INR Range: 1.5 - 2.0  Tab strength: warfarin 2mg    Contact 1: [phone] cell   Contact 2:   Lab: P[facility]/[place] - [facility]   Comments: Tylenol regularly    [facility] anticoag SCN: Tammi Sou RN ext [phone]    INR: 1.7  Current Dose: 6 mg Th/M, 4 mg all other days  Clinical Outcomes       Positives:  INR In Range            Plan: 6 mg Th/M, 4 mg all other days; recheck in two weeks. Confirmed dosing and testing with patient via MyChart message.    Next INR Date: 3.27.24    Erie Noe, RN SCN  [facility]  Ext. [phone]          For additional information, see dosing calendar below.      Warfarin Therapy Instructions   February 2024 Details    Sun [name] Thu Fri Sat         1               2               3                 4               5               6               7               8               9               10                 11               12               13               14               15               16               17                 18               19               20               21               22               23               24                 25               26                27  28   1.9   4 mg   See details      29      6 mg            Date Details   02/28 Last INR check     INR: 1.9              Warfarin Therapy Instructions   March 2024 Details    Sun [name] Thu Fri Sat          1      4 mg         2      4 mg           3      4 mg         4      6 mg         5      4 mg         6      4 mg         7      6 mg         8      4 mg         9      4 mg           10      4 mg         11      6 mg         12      4 mg         13      4 mg         14   1.7   6 mg   See details      15      4 mg         16      4 mg           17      4 mg         18      6 mg         19      4 mg         20      4 mg         21      6 mg         22      4  mg         23      4 mg           24      4 mg         25      6 mg         26      4 mg         27      4 mg         28               29               30                  31  Date Details   03/14 This INR check     INR: 1.7       Date of next INR:  01/13/2023

## 2023-01-05 DIAGNOSIS — L98499 Non-pressure chronic ulcer of skin of other sites with unspecified severity: Secondary | ICD-10-CM

## 2023-01-05 LAB — SURGICAL PATHOLOGY SPECIMEN

## 2023-01-11 ENCOUNTER — Encounter (HOSPITAL_COMMUNITY): Payer: Self-pay

## 2023-01-14 ENCOUNTER — Encounter (HOSPITAL_COMMUNITY): Payer: Self-pay

## 2023-01-14 ENCOUNTER — Ambulatory Visit (HOSPITAL_COMMUNITY): Payer: Self-pay

## 2023-01-14 LAB — PT/INR: INR: 2

## 2023-01-14 NOTE — Progress Notes (Signed)
Anticoagulation Encounter Note:    Surgical AVR plan   Warfarin start date: 01-06-2019   [facility] Staff: Posey Pronto (with fellow)  DX : On-X AVR   INR Range: 1.5 - 2.0  Tab strength: warfarin 2mg    Contact 1: [phone] cell   Contact 2:   Lab: POC/[place] - [facility]   Comments: Tylenol regularly    [facility] anticoag SCN: Tammi Sou RN ext [phone]    INR: 2.0  Current Dose: 6 mg Th/M, 4 mg all other days  Clinical Outcomes       Positives:  INR In Range            Plan: 4 mg today then 6 mg M/Th, 4 mg all other days; recheck in two weeks. Confirmed dosing and testing with patient via MyChart message.    Next INR Date: 4.11.24    Erie Noe, RN SCN  [facility]  Ext. [phone]         For additional information, see dosing calendar below.      Warfarin Therapy Instructions   March 2024 Details    Sun [name] Thu Fri Sat          1               2                 3               4               5               6               7               8               9                 10               11               12               13               14    1.7   6 mg   See details      15      4 mg         16      4 mg           17      4 mg         18      6 mg         19      4 mg         20      4 mg         21      6 mg         22      4 mg         23      4 mg           24      4 mg         25      6 mg  26      4 mg         27      4 mg         28   2.0   4 mg   See details      29      4 mg         30      4 mg           31      4 mg                Date Details   03/14 Last INR check     INR: 1.7      03/28 This INR check     INR: 2.0              Warfarin Therapy Instructions   April 2024 Details    Sun Mon Tue Wed Thu Fri Sat      1      6 mg         2      4 mg         3      4 mg         4      6 mg         5      4 mg         6      4 mg           7      4 mg         8      6 mg         9      4 mg         10      4 mg         11      6 mg         12               13                 14               15               16                17               18               19               20                 21               22               23               24               25               26               27                  28  29               30                    Date Details   No additional details    Date of next INR:  01/28/2023

## 2023-01-18 ENCOUNTER — Ambulatory Visit: Payer: Medicare Other | Attending: Ophthalmology | Admitting: Ophthalmology

## 2023-01-18 ENCOUNTER — Encounter (INDEPENDENT_AMBULATORY_CARE_PROVIDER_SITE_OTHER): Payer: Self-pay | Admitting: Ophthalmology

## 2023-01-18 ENCOUNTER — Inpatient Hospital Stay (HOSPITAL_BASED_OUTPATIENT_CLINIC_OR_DEPARTMENT_OTHER)
Admission: RE | Admit: 2023-01-18 | Discharge: 2023-01-18 | Disposition: A | Discharge status: Home / Self Care | Payer: Medicare Other | Source: Ambulatory Visit

## 2023-01-18 ENCOUNTER — Other Ambulatory Visit: Payer: Self-pay

## 2023-01-18 DIAGNOSIS — H25813 Combined forms of age-related cataract, bilateral: Secondary | ICD-10-CM | POA: Insufficient documentation

## 2023-01-18 NOTE — Progress Notes (Addendum)
[facility]  [place] [address]  Operated by [place]         Patient Name: [name]  MRN#: [id number]  Birthdate: [birth year]    Date of Service: 01/18/2023    Chief Complaint    Cataract         Chawn Rackliff is a 67 y.o. female who presents today for evaluation/consultation of:  HPI       Cataract    In both eyes.  Associated symptoms include blurred vision and haloes.  Severity is mild.  Onset was gradual.  Frequency is constant.             Comments    Pt is here for f/u, cataracts OU   Pt states that they were thinking about cataract sx that was mentioned last visit, and wants to proceed   Pt states that vision seems to be stable, no changes  Pt states that some days are better than others in terms of vision-- states that right now they can see a few more floaters   Pt endorses occ irritation/itchiness due to seasonal allergies   Pt using visine PRN OU           Last edited by Willette Pa, Brandon on 01/18/2023  2:19 PM.        ROS    Positive for: Eyes  Negative for: Constitutional, Gastrointestinal, Neurological, Skin, Genitourinary, Musculoskeletal, HENT, Endocrine, Cardiovascular, Respiratory, Psychiatric, Allergic/Imm (cat check), Heme/Lymph  Last edited by Willette Pa, COA on 01/18/2023  1:49 PM.         All other systems Negative    Willette Pa, COA  01/18/2023, 14:06    Base Eye Exam       Visual Acuity (Snellen - Linear)         Right Left    Dist cc 20/30 20/30 -1    Dist ph cc 20/25 -1 NI      Correction: Glasses              Tonometry (Tonopen, 2:17 PM)         Right Left    Pressure 16 14              Pupils         Pupils Shape APD    Right PERRL Round None    Left PERRL Round None              Visual Fields (Counting fingers)         Right Left     Full Full              Extraocular Movement         Right Left     Full Full              Neuro/Psych       Oriented x3: Yes    Mood/Affect: Normal              Dilation       Both eyes:  1.0% Mydriacyl, 0.5% Proparacaine, 2.5% Phenylephrine @ 2:17 PM                  Additional Tests       Glare Testing (Transilluminator)         High    Right 20/30-2    Left 20/40+2  Slit Lamp and Fundus Exam       External Exam         Right Left    External Normal Normal              Slit Lamp Exam         Right Left    Lids/Lashes Dermatochalasis - upper lid Dermatochalasis - upper lid    Conjunctiva/Sclera White and quiet White and quiet    Cornea Clear Clear    Anterior Chamber Deep and quiet Deep and quiet    Iris Round and reactive Round and reactive    Lens Nuclear sclerosis Nuclear sclerosis    Anterior Vitreous Normal PVD              Fundus Exam         Right Left    Disc Normal Normal    C/D Ratio 0.3 0.3    Macula Normal Normal    Vessels Normal Normal    Periphery peripheral drusen peripheral drusen                  Refraction       Wearing Rx         Sphere Cylinder Axis Add    Right -4.25 +1.75 019 +2.50    Left -4.00 +1.25 173 +2.50      Type: PAL              Manifest Refraction         Sphere Cylinder Axis Dist VA Add Near [place]    Right -3.25 +1.75 028 20/20-2 +2.75 20/20-1    Left -3.00 +2.00 173 20/30+1 +2.75 20/20                      Base Eye Exam       Visual Acuity (Snellen - Linear)         Right Left    Dist cc 20/30 20/30 -1    Dist ph cc 20/25 -1 NI      Correction: Glasses              Tonometry (Tonopen, 2:17 PM)         Right Left    Pressure 16 14              Pupils         Pupils Shape APD    Right PERRL Round None    Left PERRL Round None              Visual Fields (Counting fingers)         Right Left     Full Full              Extraocular Movement         Right Left     Full Full              Neuro/Psych       Oriented x3: Yes    Mood/Affect: Normal              Dilation       Both eyes: 1.0% Mydriacyl, 0.5% Proparacaine, 2.5% Phenylephrine @ 2:17 PM                  Additional Tests       Glare Testing (Transilluminator)         High    Right 20/40+2    Left  20/40+2                  Slit Lamp and Fundus Exam       External Exam         Right Left    External Normal Normal              Slit Lamp Exam         Right Left    Lids/Lashes Dermatochalasis - upper lid Dermatochalasis - upper lid    Conjunctiva/Sclera White and quiet White and quiet    Cornea Clear Clear    Anterior Chamber Deep and quiet Deep and quiet    Iris Round and reactive Round and reactive    Lens Nuclear sclerosis Nuclear sclerosis    Anterior Vitreous Normal PVD              Fundus Exam         Right Left    Disc Normal Normal    C/D Ratio 0.3 0.3    Macula drusen drusen    Vessels Normal Normal    Periphery peripheral drusen peripheral drusen                  Refraction       Wearing Rx         Sphere Cylinder Axis Add    Right -4.25 +1.75 019 +2.50    Left -4.00 +1.25 173 +2.50      Type: PAL              Manifest Refraction         Sphere Cylinder Axis Dist VA Add Near [place]    Right -3.25 +1.75 028 20/20-2 +2.75 20/20-1    Left -3.00 +2.00 173 20/30+1 +2.75 20/20                    MD Addition to HPI:  Visual complaints, difficulty with reading        ICD-10-CM    1. Combined form of age-related cataract, both eyes  H25.813 OPH IOL MASTER        Assessment: Visual significant cataract bilateral eye.  CPT: [place] with IOL  [id number]  The patient's impairment of visual function is believed not to be correctable with a tolerable change in glasses or contact lenses.     Cataract (in the operative eye) is believed to be significantly contributing to the patient's visual impairment.      Risks and benefits of cataract surgery reviewed with the patient including the risk of vision loss from infection or intraocular bleeding.  The increased risk of retinal detachment following cataract surgery was discussed.  Patient understands the risks and benefits and is agreeable to proceed.     There is a reasonable expectation that lens surgery will significantly improve both the visual and functional status of the  patient.  The patient desires surgical correction.      Plan: Risks, benefits, and nature of cataract extraction with lens implant reviewed and patient consents to surgery. Plan topical anesthesia with Monitored Anesthesia Care (MAC). Patient to see surgery coordinator today to schedule surgery.     Anticipated lens implant: [place]  - macular drusen ou   Special needs:  none, left eye first  Co-management: No  OCT: Not done    PVD OS  Early ARMD OU       Despina Hidden, MD  01/18/2023, 14:50    I have seen and examined  the above patient. I discussed the above diagnoses listed in the assessment and the above ophthalmic plan of care with the patient and patient's family. All questions were answered. I reviewed and, when necessary, made changes to the technician/resident note, documented ophthalmology exam, chief complaint, history of present illness, allergies, review of systems, past medical, past surgical, family and social history. I personally reviewed and interpreted all testing and/or imaging performed at this visit and agree with the resident's or fellow's interpretation. Any exceptions/additions are edited/noted in the relevant encounter fields.  Despina Hidden, MD  01/18/2023, 14:50      I reviewed and made necessary changes to the technician, resident or fellow note regarding CC/HPI/ROS/Past Family, Medical and Social Hx/Surg Hx.  Gwendalyn Ege, MD 01/18/2023, 14:56      I saw and examined the patient.  I reviewed the resident's note.  I agree with the findings and plan of care as documented in the resident's note. I personally reviewed and interpreted all testing and/or imaging performed at this visit and agree with the resident or fellow's interpretation. Any exceptions/additions are edited/noted.    Gwendalyn Ege, MD 01/18/2023, 14:56

## 2023-01-19 ENCOUNTER — Ambulatory Visit (HOSPITAL_BASED_OUTPATIENT_CLINIC_OR_DEPARTMENT_OTHER): Payer: Self-pay | Admitting: Family

## 2023-01-19 NOTE — Telephone Encounter (Addendum)
Appt scheduled for pre op visit with PCP  Arnette Felts, RN      Regarding: call back request  ----- Message from Flonnie Hailstone II sent at 01/19/2023  1:37 PM EDT -----  Derek Jack, APRN,FNP-BC    Pt calling in stating a pre op clearance form was sent to have provider filled out. Advised pt that typically a pre op is needed within 30 days of appointment. Pt is requesting a call back to discuss how the provider would like to proceed. Please advise. Thank you

## 2023-01-20 ENCOUNTER — Inpatient Hospital Stay (HOSPITAL_COMMUNITY)
Admission: RE | Admit: 2023-01-20 | Discharge: 2023-01-20 | Disposition: A | Discharge status: Home / Self Care | Payer: Medicare Other | Source: Ambulatory Visit

## 2023-01-20 ENCOUNTER — Encounter (HOSPITAL_COMMUNITY): Payer: Self-pay

## 2023-01-25 ENCOUNTER — Ambulatory Visit (HOSPITAL_BASED_OUTPATIENT_CLINIC_OR_DEPARTMENT_OTHER): Payer: Self-pay | Admitting: Family

## 2023-01-27 ENCOUNTER — Other Ambulatory Visit (HOSPITAL_BASED_OUTPATIENT_CLINIC_OR_DEPARTMENT_OTHER): Payer: Self-pay | Admitting: Surgical

## 2023-01-27 ENCOUNTER — Encounter (HOSPITAL_BASED_OUTPATIENT_CLINIC_OR_DEPARTMENT_OTHER): Payer: Self-pay | Admitting: Orthopaedic Surgery

## 2023-01-27 MED ORDER — CLINDAMYCIN HCL 300 MG CAPSULE
ORAL_CAPSULE | ORAL | 0 refills | Status: DC
Start: 2023-01-27 — End: 2023-05-07

## 2023-01-28 ENCOUNTER — Encounter (HOSPITAL_COMMUNITY): Payer: Self-pay

## 2023-01-28 ENCOUNTER — Ambulatory Visit (HOSPITAL_COMMUNITY): Payer: Self-pay

## 2023-01-28 LAB — PT/INR: INR: 1.8

## 2023-01-28 NOTE — Progress Notes (Signed)
Anticoagulation Encounter Note:    Surgical AVR plan   Warfarin start date: 01-06-2019   [facility] Staff: [name] (with fellow)  DX : On-X AVR   INR Range: 1.5 - 2.0  Tab strength: warfarin 2mg    Contact 1: [phone] cell   Contact 2:   Lab: POC/ - [facility]   Comments: Tylenol regularly    [facility] anticoag SCN: [name] RN ext [phone]    INR: 1.8  Current Dose: 6 mg Th/M, 4 mg all other days  Clinical Outcomes       Positives:  INR In Range            Plan: 6 mg Th/M, 4 mg all other days; recheck in two weeks. Confirmed dosing and testing with patient via MyChart message. Patient stated she will be having cataract surgery on 4.28.24 and 5.7.24. Stated surgeon did not need warfarin held for surgeries.   Next INR Date: 4.25.24    [name], RN SCN  Anticoag clinic  Ext. [phone]         For additional information, see dosing calendar below.      Warfarin Therapy Instructions   March 2024 Details    Sun Loyal Buba Thu Fri Sat          1               2                 3               4               5               6               7               8               9                 10               11               12               13               14               15               16                 17               18               19               20               21               22               23                 24                25  2.0   4 mg   See details      29      4 mg         30      4 mg           31      4 mg                Date Details   03/28 Last INR check     INR: 2.0              Warfarin Therapy Instructions   April 2024 Details    Sun Loyal Buba Thu Fri Sat      1      6 mg         2      4 mg         3      4 mg         4      6 mg         5      4 mg         6      4 mg           7      4 mg         8      6 mg         9      4 mg         10      4 mg         11   1.8   6 mg   See details      12      4 mg         13      4 mg           14      4  mg         15      6 mg         16      4 mg         17      4 mg         18      6 mg         19      4 mg         20      4 mg           21      4 mg         22      6 mg         23      4 mg         24      4 mg         25      6 mg         26               27                 28  29               30                    Date Details   04/11 This INR check     INR: 1.8       Date of next INR:  02/11/2023

## 2023-01-29 ENCOUNTER — Encounter (HOSPITAL_BASED_OUTPATIENT_CLINIC_OR_DEPARTMENT_OTHER): Payer: Self-pay | Admitting: GENERAL PRACTICE

## 2023-02-02 ENCOUNTER — Other Ambulatory Visit: Payer: Self-pay

## 2023-02-02 ENCOUNTER — Ambulatory Visit: Payer: Medicare Other | Attending: Family | Admitting: Family

## 2023-02-02 ENCOUNTER — Encounter (HOSPITAL_BASED_OUTPATIENT_CLINIC_OR_DEPARTMENT_OTHER): Payer: Self-pay | Admitting: Family

## 2023-02-02 ENCOUNTER — Other Ambulatory Visit (HOSPITAL_BASED_OUTPATIENT_CLINIC_OR_DEPARTMENT_OTHER): Payer: Medicare Other | Admitting: Gynecology

## 2023-02-02 VITALS — BP 130/76 | HR 80 | Temp 96.9°F | Ht 65.5 in | Wt 222.4 lb

## 2023-02-02 DIAGNOSIS — Z01818 Encounter for other preprocedural examination: Secondary | ICD-10-CM

## 2023-02-02 DIAGNOSIS — I10 Essential (primary) hypertension: Secondary | ICD-10-CM | POA: Insufficient documentation

## 2023-02-02 DIAGNOSIS — Z87891 Personal history of nicotine dependence: Secondary | ICD-10-CM

## 2023-02-02 DIAGNOSIS — I77819 Aortic ectasia, unspecified site: Secondary | ICD-10-CM | POA: Insufficient documentation

## 2023-02-02 DIAGNOSIS — I9789 Other postprocedural complications and disorders of the circulatory system, not elsewhere classified: Secondary | ICD-10-CM

## 2023-02-02 DIAGNOSIS — R21 Rash and other nonspecific skin eruption: Secondary | ICD-10-CM | POA: Insufficient documentation

## 2023-02-02 DIAGNOSIS — I4891 Unspecified atrial fibrillation: Secondary | ICD-10-CM

## 2023-02-02 LAB — BASIC METABOLIC PANEL
ANION GAP: 8 mmol/L (ref 4–13)
BUN/CREA RATIO: 19 (ref 6–22)
BUN: 14 mg/dL (ref 8–25)
CALCIUM: 9.4 mg/dL (ref 8.6–10.3)
CHLORIDE: 97 mmol/L (ref 96–111)
CO2 TOTAL: 28 mmol/L (ref 23–31)
CREATININE: 0.74 mg/dL (ref 0.60–1.05)
ESTIMATED GFR - FEMALE: 88 mL/min/BSA (ref >=60)
GLUCOSE: 73 mg/dL (ref 65–125)
POTASSIUM: 3.5 mmol/L (ref 3.5–5.1)
SODIUM: 133 mmol/L — ABNORMAL LOW (ref 136–145)

## 2023-02-02 LAB — CBC WITH DIFF
BASOPHIL #: <0.1 10*3/uL (ref <=0.20)
BASOPHIL %: 0.6 %
EOSINOPHIL #: <0.1 10*3/uL (ref <=0.50)
EOSINOPHIL %: 1.8 %
HCT: 41.7 % (ref 34.8–46.0)
HGB: 13.5 g/dL (ref 11.5–16.0)
IMMATURE GRANULOCYTE #: <0.1 10*3/uL (ref <0.10)
IMMATURE GRANULOCYTE %: 0.4 % (ref 0.0–1.0)
LYMPHOCYTE #: 1.67 10*3/uL (ref 1.00–4.80)
LYMPHOCYTE %: 32.9 %
MCH: 29.9 pg (ref 26.0–32.0)
MCHC: 32.4 g/dL (ref 31.0–35.5)
MCV: 92.5 fL (ref 78.0–100.0)
MONOCYTE #: 0.46 10*3/uL (ref 0.20–1.10)
MONOCYTE %: 9.1 %
MPV: 9 fL (ref 8.7–12.5)
NEUTROPHIL #: 2.81 10*3/uL (ref 1.50–7.70)
NEUTROPHIL %: 55.2 %
PLATELETS: 262 10*3/uL (ref 150–400)
RBC: 4.51 10*6/uL (ref 3.85–5.22)
RDW-CV: 12.4 % (ref 11.5–15.5)
WBC: 5.1 10*3/uL (ref 3.7–11.0)

## 2023-02-02 MED ORDER — HYDROCHLOROTHIAZIDE 25 MG TABLET
25.0000 mg | ORAL_TABLET | Freq: Every day | ORAL | 0 refills | Status: DC
Start: 2023-02-02 — End: 2023-03-03

## 2023-02-02 NOTE — Progress Notes (Signed)
[facility]  Pre-Operative Evaluation Note    Service date: 02/02/2023:   [name],   Birth date: 02-11-1956  Shenandoah Yeats is a 67 y.o. female who presents to [facility] today with chief complaint of    Chief Complaint              Pre-op Evaluation            HISTORY OF PRESENT ILLNESS   Referred today  for preoperative evaluation for planned surgery:   Cataract surgery with Dr. Christell Constant  Surgery date: April 23 (left) + May 7 (right)    Pre-op evaluation Checklist:  History of past Anesthesia reaction/problems: no, previous reaction to Stadol.  History of post-op nausea and vomiting: no  The patient states she drinks 0 days per week.  Current or previous narcotic use: Has taken tramadol and hydrocodone after surgery.   History of recent URI or respiratory infection:  URI in November 2023.  History of cardiac disease:  Aortic stenosis and valve replacement.  Functional Assessment: she is MODERATE: (4-7 METs) walk 4 mph, rake leaves, climb 1 flight of stairs    Immunization status:   Immunization History   Administered Date(s) Administered    Covid-19 Vaccine,Pfizer Bivalent,10mcg/0.3ml,12 yrs+ 07/15/2021    Covid-19 Vaccine,Pfizer-BioNTech,Purple Top,22yrs+ 12/13/2019, 01/03/2020, 08/26/2020    Influenza Vaccine, 6 month-adult 07/11/2019, 07/15/2021    Influenza Vaccine, 65+ 08/26/2020    Pneumovax 01/13/2021    Tetanus Toxoid/Diphtheria Toxoid/Acellular Pertussis Vaccine, Adsorbed 11/08/2018    VAXNEUVANCE 02/12/2022        MEDICAL HISTORY   Problem List - Current  Patient Active Problem List    Diagnosis    Primary osteoarthritis of right hip    Psoriasis of scalp    Hx of inverse psoriasis    Postoperative atrial fibrillation (CMS HCC)    Osteoarthritis of right hip    S/P AVR (aortic valve replacement)    Severe aortic stenosis    Low back pain radiating to left leg    S/P hip replacement    Essential hypertension      Past Medical/Surgical/Family History  I have reviewed and updated as appropriate the past medical, family and social history on 02/02/2023 as summarized below:    Past Medical History:   Diagnosis Date    Abdominal hernia 04/29/2020    hx of 3 repairs    Anemia     Aortic stenosis     Arthritis     Atrial fibrillation (CMS HCC) 04/29/2020    one episode  after AVR    Back problem 04/29/2020    LBP    Beta blocker prescribed for left ventricular systolic dysfunction     Blood in stool     positive cologuard.  Has not yet scheduled colonoscopy    Blood thinned due to long-term anticoagulant use 04/29/2020    coumadin    Cancer (CMS HCC)     skin cancer L lip.  Needs MOHS to be scheduled    Chronic pain 04/29/2020    back and right hip    Colon polyp     Dysrhythmias     1 episode of A-fib per patient    Heart murmur 01/04/2019    AVR    High blood pressure     Motion sickness     Obesity     Rash 04/29/2020    scalp psoriasis and involves R ear  Valvular disease     aortic valve stenosis, following with CT surgery, echo 02/2016 and cath 03/2016    Wears glasses        Past Surgical History:   Procedure Laterality Date    COLONOSCOPY      HX AORTIC VALVE REPLACEMENT      HX CESAREAN SECTION  1984    HX HEART CATHETERIZATION      x2 with no intervention per pt report    HX HERNIA REPAIR  2004, 2006, 2008    x3    HX HIP REPLACEMENT Bilateral     HX HYSTERECTOMY  2003    HX OOPHORECTOMY      1 ovary     Outpatient Medications Marked as Taking for the 02/02/23 encounter (Office Visit) with Hollice Gong, APRN,FNP-BC   Medication Sig    ascorbic acid, vitamin C, (VITAMIN C) 500 mg Oral Tablet Take 0.5 Tablets (250 mg total) by mouth Once a day    aspirin 81 mg Oral Tablet, Chewable Take 1 Tablet (81 mg total) by mouth Once a day    Bifidobacterium infantis (ALIGN ORAL) Take by mouth    Cholecalciferol, Vitamin D3, 50 mcg (2,000 unit) Oral Capsule Take 2,000 Units by mouth Once a day     clobetasoL (TEMOVATE)  0.05 % Cream Apply topically Twice daily    Fluocinolone-Shower Cap 0.01 % Oil Apply to scalp 1-2 times weekly at night.    hydroCHLOROthiazide (HYDRODIURIL) 25 mg Oral Tablet Take 1 Tablet (25 mg total) by mouth Once a day    loratadine (CLARITIN) 10 mg Oral Tablet Take 1 Tablet (10 mg total) by mouth Once a day    metoprolol succinate (TOPROL-XL) 50 mg Oral Tablet Sustained Release 24 hr Take 1 Tablet (50 mg total) by mouth Once a day (Patient taking differently: Take 1 Tablet (50 mg total) by mouth Every night)    triamcinolone acetonide (ARISTOCORT A) 0.1 % Cream Apply topically Twice daily    warfarin (COUMADIN) 2 mg Oral Tablet Take 3 Tablets (6 mg total) by mouth Every evening Or as directed by Anticoag clinic     Allergies   Allergen Reactions    Latex      Added based on information entered during case entry, please review and add reactions, type, and severity as needed    Iv Contrast Anaphylaxis    Penicillin Itching, Diarrhea and Hives/ Urticaria    Oregano      Hives      Stadol [Butorphanol Tartrate] Mental Status Effect    Tomato Hives/ Urticaria     Family Medical History:       Problem Relation (Age of Onset)    Atrial fibrillation Sister    Heart Disease Mother    Hypertension (High Blood Pressure) Father    Lung Cancer Father, Brother    Macular Degen Maternal Aunt    Mitral valve prolapse Sister    Retinal Detachment Sister    Sudden Death no cause Mother            Social History     Tobacco Use    Smoking status: Former     Current packs/day: 0.00     Average packs/day: 1 pack/day for 42.0 years (42.0 ttl pk-yrs)     Types: Cigarettes     Start date: 52     Quit date: 2014     Years since quitting: 10.2    Smokeless tobacco: Never   Substance Use  Topics    Alcohol use: Yes     Comment: rarely   ,   Social History     Social History Narrative    Not on file     Review of Systems  Constitutional: negative for fevers, chills and weight loss  Eyes: negative for visual disturbance  Ears, nose,  mouth, throat, and face: negative for hearing loss, tinnitus, earaches and hoarseness  Respiratory: negative for cough or dyspnea on exertion  Cardiovascular: negative for chest pressure/discomfort, palpitations, near-syncope, syncope  Gastrointestinal: negative for dysphagia, nausea, vomiting, diarrhea, constipation, abdominal pain and blood in bowel movements    Genitourinary:negative for frequency, dysuria and hematuria.  No hesitancy, decreased urine stream or incomplete emptying.  No nocturia.    Integument/breast: negative for rash and changed mole; negative for breast lump or nipple discharge  Hematologic/lymphatic: negative for lymphadenopathy or bruising  Musculoskeletal: negative for myalgias, arthralgias and muscle weakness  Neurological: negative for headache and weakness  Behvioral/Psych: negative for depression, suicidal thoughts and anxiety  Endocrine: negative for diabetic symptoms including polyuria, polydipsia and weight loss  Allergic/Immunologic: negative for hives    OBJECTIVE   Vital Signs  BP 130/76   Pulse 80   Temp 36.1 C (96.9 F)   Ht 1.664 m (5' 5.5")   Wt 101 kg (222 lb 7.1 oz)   SpO2 97%   BMI 36.45 kg/m     Physical Exam  Constitutional:       General: She is not in acute distress.     Appearance: Normal appearance.   HENT:      Head: Normocephalic and atraumatic.      Right Ear: Tympanic membrane, ear canal and external ear normal.      Left Ear: There is impacted cerumen.      Nose: Nose normal.      Mouth/Throat:      Mouth: Mucous membranes are moist.      Pharynx: Oropharynx is clear.   Eyes:      Pupils: Pupils are equal, round, and reactive to light.   Cardiovascular:      Rate and Rhythm: Normal rate and regular rhythm.      Pulses: Normal pulses.      Comments: +mechanical click r/t Aortic Valve Replacement  Pulmonary:      Effort: Pulmonary effort is normal. No respiratory distress.      Breath sounds: Normal breath sounds. No wheezing, rhonchi or rales.   Abdominal:       General: Bowel sounds are normal.      Palpations: Abdomen is soft.      Tenderness: There is no abdominal tenderness. There is no right CVA tenderness or left CVA tenderness.   Musculoskeletal:         General: Normal range of motion.      Cervical back: Normal range of motion.   Lymphadenopathy:      Cervical: No cervical adenopathy.   Skin:     General: Skin is warm and dry.   Neurological:      General: No focal deficit present.      Mental Status: She is alert.   Psychiatric:         Mood and Affect: Mood normal.         Behavior: Behavior normal.         Thought Content: Thought content normal.         Judgment: Judgment normal.        Laboratory Studies/Data  recommend per pre op guidelines/ordered/ reviewed:  EKG, CBC, BMP ordered.    ASSESSMENT     Assessment/Plan   1. Pre-op evaluation      PLAN   1. Preoperative Consultation for Cataract surgery under local / MAC / General anesthesia.   Patient has 0 risk factors for major cardiac complications by the Revised Goldman Cardiac Risk Index. This puts him/her at approximately 0.4% risk of a major cardiac complication during the planned procedure.    Patient on ASA and Coumadin - medications do NOT need to be held.   Patient has a known occurrence of  Post-operative Atrial Fibrillation - after aortic valve replacement in July 2021.  EKG - unremarkable.  CBC, and BMP ordered - results pending.    Patient has been informed of this risk, is willing to proceed, and thus is medically cleared for the planned procedure.    Sharyon Medicus, Student NP 02/02/2023, 13:13  [facility]  [address]  [place], [address]  Ph [phone]  Fax [phone]    Revised Elonda Husky Cardiac Risk Index:  Six independent predictors of major cardiac complications (defined as myocardial infarction, pulmonary edema, ventricular fibrillation or primary cardiac arrest, and complete heart block)  0High-risk type of surgery (intraperitoneal, intrathoracic, or suprainguinal  vascular surgery)   0History of ischemic heart disease (history of MI or a positive exercise test, current complaint of chest pain considered to be secondary to myocardial ischemia, use of nitrate therapy, or ECG with pathological Q waves; do not count prior coronary revascularization procedure unless one of the other criteria for ischemic heart disease is present)   0History of HF   0History of cerebrovascular disease   0Diabetes mellitus requiring treatment with insulin   0Preoperative serum creatinine >2.0 mg/dL (130 mol/L)   Total risk factors 0  These predictors were then validated in a cohort of 1422 patients. The predictive value was significant in all types of elective major noncardiac surgery except for abdominal aortic aneurysm surgery   The risk of major cardiac complications varied according to the number of risk factors. If one includes only cardiac death, nonfatal MI, and nonfatal cardiac arrest as major cardiac events in the [place] cohort, the following rates of adverse outcomes were seen:  No risk factors -- 0.4 percent (95% CI 0.1-0.8 percent)   One risk factor -- 1.0 percent (95% CI 0.5-1.4 percent) (Consider noninvasive cardiac testing)  Two risk factors -- 2.4 percent (95% CI 1.3-3.5 percent) (Consider noninvasive cardiac testing)  Three or more risk factors -- 5.4 percent (95% CI 2.8-7.9 percent)

## 2023-02-03 ENCOUNTER — Ambulatory Visit (HOSPITAL_BASED_OUTPATIENT_CLINIC_OR_DEPARTMENT_OTHER): Payer: Self-pay | Admitting: Family

## 2023-02-03 ENCOUNTER — Other Ambulatory Visit: Payer: Self-pay

## 2023-02-03 MED ORDER — OFLOXACIN 0.3 % EYE DROPS
OPHTHALMIC | 0 refills | Status: DC
Start: 2023-02-03 — End: 2023-02-17
  Filled 2023-02-03: qty 5, 18d supply, fill #0

## 2023-02-03 MED ORDER — PREDNISOLONE ACETATE 1 % EYE DROPS,SUSPENSION
OPHTHALMIC | 0 refills | Status: DC
Start: 2023-02-03 — End: 2023-02-17
  Filled 2023-02-03: qty 5, 18d supply, fill #0

## 2023-02-03 NOTE — Progress Notes (Signed)
[facility]  Pre-Operative Evaluation Note    APP student note:    Service date: 02/02/2023:   [name],   Birth date: 1955-11-04  [name] is a 67 y.o. female who presents to [facility] today with chief complaint of    Chief Complaint              Pre-op Evaluation            HISTORY OF PRESENT ILLNESS   Referred today  for preoperative evaluation for planned surgery:   Cataract surgery with Dr. Christell Constant  Surgery date: April 23 (left) + May 7 (right)    Pre-op evaluation Checklist:  History of past Anesthesia reaction/problems: no, previous reaction to Stadol.  History of post-op nausea and vomiting: no  The patient states she drinks 0 days per week.  Current or previous narcotic use: Has taken tramadol and hydrocodone after surgery.   History of recent URI or respiratory infection:  URI in November 2023.  History of cardiac disease:  Aortic stenosis and valve replacement.  Functional Assessment: she is MODERATE: (4-7 METs) walk 4 mph, rake leaves, climb 1 flight of stairs    Immunization status:   Immunization History   Administered Date(s) Administered    Covid-19 Vaccine,Pfizer Bivalent,36mcg/0.3ml,12 yrs+ 07/15/2021    Covid-19 Vaccine,Pfizer-BioNTech,Purple Top,43yrs+ 12/13/2019, 01/03/2020, 08/26/2020    Influenza Vaccine, 6 month-adult 07/11/2019, 07/15/2021    Influenza Vaccine, 65+ 08/26/2020    Pneumovax 01/13/2021    Tetanus Toxoid/Diphtheria Toxoid/Acellular Pertussis Vaccine, Adsorbed 11/08/2018    VAXNEUVANCE 02/12/2022        MEDICAL HISTORY   Problem List - Current  Patient Active Problem List    Diagnosis    Aortic ectasia, unspecified site (CMS HCC)    Primary osteoarthritis of right hip    Psoriasis of scalp    Hx of inverse psoriasis    Postoperative atrial fibrillation (CMS HCC)    Osteoarthritis of right hip    S/P AVR (aortic valve replacement)    Severe aortic stenosis    Low back pain radiating  to left leg    S/P hip replacement    Essential hypertension     Past Medical/Surgical/Family History  I have reviewed and updated as appropriate the past medical, family and social history on 02/02/2023 as summarized below:    Past Medical History:   Diagnosis Date    Abdominal hernia 04/29/2020    hx of 3 repairs    Anemia     Aortic stenosis     Arthritis     Atrial fibrillation (CMS HCC) 04/29/2020    one episode  after AVR    Back problem 04/29/2020    LBP    Beta blocker prescribed for left ventricular systolic dysfunction     Blood in stool     positive cologuard.  Has not yet scheduled colonoscopy    Blood thinned due to long-term anticoagulant use 04/29/2020    coumadin    Cancer (CMS HCC)     skin cancer L lip.  Needs MOHS to be scheduled    Chronic pain 04/29/2020    back and right hip    Colon polyp     Dysrhythmias     1 episode of A-fib per patient    Heart murmur 01/04/2019    AVR    High blood pressure     Motion sickness     Obesity  Rash 04/29/2020    scalp psoriasis and involves R ear    Valvular disease     aortic valve stenosis, following with CT surgery, echo 02/2016 and cath 03/2016    Wears glasses        Past Surgical History:   Procedure Laterality Date    COLONOSCOPY      HX AORTIC VALVE REPLACEMENT      HX CESAREAN SECTION  1984    HX HEART CATHETERIZATION      x2 with no intervention per pt report    HX HERNIA REPAIR  2004, 2006, 2008    x3    HX HIP REPLACEMENT Bilateral     HX HYSTERECTOMY  2003    HX OOPHORECTOMY      1 ovary     Outpatient Medications Marked as Taking for the 02/02/23 encounter (Office Visit) with Hollice Gong, APRN,FNP-BC   Medication Sig    ascorbic acid, vitamin C, (VITAMIN C) 500 mg Oral Tablet Take 0.5 Tablets (250 mg total) by mouth Once a day    aspirin 81 mg Oral Tablet, Chewable Take 1 Tablet (81 mg total) by mouth Once a day    Bifidobacterium infantis (ALIGN ORAL) Take by mouth    Cholecalciferol, Vitamin D3, 50 mcg (2,000 unit) Oral Capsule Take  2,000 Units by mouth Once a day     clobetasoL (TEMOVATE) 0.05 % Cream Apply topically Twice daily    Fluocinolone-Shower Cap 0.01 % Oil Apply to scalp 1-2 times weekly at night.    hydroCHLOROthiazide (HYDRODIURIL) 25 mg Oral Tablet Take 1 Tablet (25 mg total) by mouth Once a day    loratadine (CLARITIN) 10 mg Oral Tablet Take 1 Tablet (10 mg total) by mouth Once a day    metoprolol succinate (TOPROL-XL) 50 mg Oral Tablet Sustained Release 24 hr Take 1 Tablet (50 mg total) by mouth Once a day (Patient taking differently: Take 1 Tablet (50 mg total) by mouth Every night)    triamcinolone acetonide (ARISTOCORT A) 0.1 % Cream Apply topically Twice daily    warfarin (COUMADIN) 2 mg Oral Tablet Take 3 Tablets (6 mg total) by mouth Every evening Or as directed by Anticoag clinic     Allergies   Allergen Reactions    Latex      Added based on information entered during case entry, please review and add reactions, type, and severity as needed    Iv Contrast Anaphylaxis    Penicillin Itching, Diarrhea and Hives/ Urticaria    Oregano      Hives      Stadol [Butorphanol Tartrate] Mental Status Effect    Tomato Hives/ Urticaria     Family Medical History:       Problem Relation (Age of Onset)    Atrial fibrillation Sister    Heart Disease Mother    Hypertension (High Blood Pressure) Father    Lung Cancer Father, Brother    Macular Degen Maternal Aunt    Mitral valve prolapse Sister    Retinal Detachment Sister    Sudden Death no cause Mother            Social History     Tobacco Use    Smoking status: Former     Current packs/day: 0.00     Average packs/day: 1 pack/day for 42.0 years (42.0 ttl pk-yrs)     Types: Cigarettes     Start date: 64     Quit date: 2014  Years since quitting: 10.2    Smokeless tobacco: Never   Substance Use Topics    Alcohol use: Yes     Comment: rarely   ,   Social History     Social History Narrative    Not on file     Review of Systems  Constitutional: negative for fevers, chills and weight  loss  Eyes: negative for visual disturbance  Ears, nose, mouth, throat, and face: negative for hearing loss, tinnitus, earaches and hoarseness  Respiratory: negative for cough or dyspnea on exertion  Cardiovascular: negative for chest pressure/discomfort, palpitations, near-syncope, syncope  Gastrointestinal: negative for dysphagia, nausea, vomiting, diarrhea, constipation, abdominal pain and blood in bowel movements    Genitourinary:negative for frequency, dysuria and hematuria.  No hesitancy, decreased urine stream or incomplete emptying.  No nocturia.    Integument/breast: negative for rash and changed mole; negative for breast lump or nipple discharge  Hematologic/lymphatic: negative for lymphadenopathy or bruising  Musculoskeletal: negative for myalgias, arthralgias and muscle weakness  Neurological: negative for headache and weakness  Behvioral/Psych: negative for depression, suicidal thoughts and anxiety  Endocrine: negative for diabetic symptoms including polyuria, polydipsia and weight loss  Allergic/Immunologic: negative for hives    OBJECTIVE   Vital Signs  BP 130/76   Pulse 80   Temp 36.1 C (96.9 F)   Ht 1.664 m (5' 5.5")   Wt 101 kg (222 lb 7.1 oz)   SpO2 97%   BMI 36.45 kg/m     Physical Exam  Constitutional:       General: She is not in acute distress.     Appearance: Normal appearance.   HENT:      Head: Normocephalic and atraumatic.      Right Ear: Tympanic membrane, ear canal and external ear normal.      Left Ear: There is impacted cerumen.      Nose: Nose normal.      Mouth/Throat:      Mouth: Mucous membranes are moist.      Pharynx: Oropharynx is clear.   Eyes:      Pupils: Pupils are equal, round, and reactive to light.   Cardiovascular:      Rate and Rhythm: Normal rate and regular rhythm.      Pulses: Normal pulses.      Comments: +mechanical click r/t Aortic Valve Replacement  Pulmonary:      Effort: Pulmonary effort is normal. No respiratory distress.      Breath sounds: Normal  breath sounds. No wheezing, rhonchi or rales.   Abdominal:      General: Bowel sounds are normal.      Palpations: Abdomen is soft.      Tenderness: There is no abdominal tenderness. There is no right CVA tenderness or left CVA tenderness.   Musculoskeletal:         General: Normal range of motion.      Cervical back: Normal range of motion.   Lymphadenopathy:      Cervical: No cervical adenopathy.   Skin:     General: Skin is warm and dry.   Neurological:      General: No focal deficit present.      Mental Status: She is alert.   Psychiatric:         Mood and Affect: Mood normal.         Behavior: Behavior normal.         Thought Content: Thought content normal.  Judgment: Judgment normal.        Laboratory Studies/Data recommend per pre op guidelines/ordered/ reviewed:  EKG, CBC, BMP ordered.    ASSESSMENT       ICD-10-CM    1. Pre-op evaluation  Z01.818 ECG 12 Lead (Add on in [place] [facility])     BASIC METABOLIC PANEL     CBC/DIFF      2. Aortic ectasia, unspecified site (CMS HCC)  I77.819       3. Postoperative atrial fibrillation (CMS HCC)  I97.89     I48.91       4. Essential hypertension  I10 hydroCHLOROthiazide (HYDRODIURIL) 25 mg Oral Tablet            PLAN   1. Preoperative Consultation for Cataract surgery under local / MAC / General anesthesia.   Patient has 0 risk factors for major cardiac complications by the Revised Goldman Cardiac Risk Index. This puts him/her at approximately 0.4% risk of a major cardiac complication during the planned procedure.    Patient on ASA and Coumadin - medications do NOT need to be held.   Patient has a known occurrence of  Post-operative Atrial Fibrillation - after aortic valve replacement in July 2021.  EKG - unremarkable.  CBC, and BMP ordered - results pending.    Patient has been informed of this risk, is willing to proceed, and thus is medically cleared for the planned procedure.    Sharyon Medicus, Student NP 02/03/2023, 09:18  [facility]    I saw  and examined the patient.  I reviewed the APP Student's note.  I agree with the findings and plan of care as documented in the APP Student's note.  Any exceptions/additions are edited/noted.    Hollice Gong, APRN,FNP-BC    Revised Goldman Cardiac Risk Index:  Six independent predictors of major cardiac complications (defined as myocardial infarction, pulmonary edema, ventricular fibrillation or primary cardiac arrest, and complete heart block)  0High-risk type of surgery (intraperitoneal, intrathoracic, or suprainguinal vascular surgery)   0History of ischemic heart disease (history of MI or a positive exercise test, current complaint of chest pain considered to be secondary to myocardial ischemia, use of nitrate therapy, or ECG with pathological Q waves; do not count prior coronary revascularization procedure unless one of the other criteria for ischemic heart disease is present)   0History of HF   0History of cerebrovascular disease   0Diabetes mellitus requiring treatment with insulin   0Preoperative serum creatinine >2.0 mg/dL (846 mol/L)   Total risk factors 0  These predictors were then validated in a cohort of 1422 patients. The predictive value was significant in all types of elective major noncardiac surgery except for abdominal aortic aneurysm surgery   The risk of major cardiac complications varied according to the number of risk factors. If one includes only cardiac death, nonfatal MI, and nonfatal cardiac arrest as major cardiac events in the [place] cohort, the following rates of adverse outcomes were seen:  No risk factors -- 0.4 percent (95% CI 0.1-0.8 percent)   One risk factor -- 1.0 percent (95% CI 0.5-1.4 percent) (Consider noninvasive cardiac testing)  Two risk factors -- 2.4 percent (95% CI 1.3-3.5 percent) (Consider noninvasive cardiac testing)  Three or more risk factors -- 5.4 percent (95% CI 2.8-7.9 percent)

## 2023-02-04 ENCOUNTER — Ambulatory Visit: Payer: Medicare Other | Attending: Family | Admitting: Family

## 2023-02-04 ENCOUNTER — Other Ambulatory Visit: Payer: Self-pay

## 2023-02-04 ENCOUNTER — Encounter (HOSPITAL_BASED_OUTPATIENT_CLINIC_OR_DEPARTMENT_OTHER): Payer: Self-pay | Admitting: Family

## 2023-02-04 ENCOUNTER — Other Ambulatory Visit (HOSPITAL_BASED_OUTPATIENT_CLINIC_OR_DEPARTMENT_OTHER): Payer: Medicare Other | Admitting: Gynecology

## 2023-02-04 VITALS — Temp 98.9°F | Ht 65.5 in | Wt 224.4 lb

## 2023-02-04 DIAGNOSIS — Z85828 Personal history of other malignant neoplasm of skin: Secondary | ICD-10-CM

## 2023-02-04 DIAGNOSIS — Z131 Encounter for screening for diabetes mellitus: Secondary | ICD-10-CM | POA: Insufficient documentation

## 2023-02-04 DIAGNOSIS — R21 Rash and other nonspecific skin eruption: Secondary | ICD-10-CM | POA: Insufficient documentation

## 2023-02-04 DIAGNOSIS — Z13220 Encounter for screening for lipoid disorders: Secondary | ICD-10-CM

## 2023-02-04 LAB — LIPID PANEL
CHOL/HDL RATIO: 2.5
CHOLESTEROL: 175 mg/dL (ref 100–200)
HDL CHOL: 70 mg/dL (ref >=50)
LDL CALC: 92 mg/dL (ref <100)
NON-HDL: 105 mg/dL (ref <=190)
TRIGLYCERIDES: 68 mg/dL (ref <150)
VLDL CALC: 11 mg/dL (ref <30)

## 2023-02-04 LAB — BASIC METABOLIC PANEL, FASTING
ANION GAP: 7 mmol/L (ref 4–13)
BUN/CREA RATIO: 21 (ref 6–22)
BUN: 18 mg/dL (ref 8–25)
CALCIUM: 9.1 mg/dL (ref 8.6–10.3)
CHLORIDE: 100 mmol/L (ref 96–111)
CO2 TOTAL: 30 mmol/L (ref 23–31)
CREATININE: 0.84 mg/dL (ref 0.60–1.05)
ESTIMATED GFR - FEMALE: 76 mL/min/BSA (ref >=60)
GLUCOSE: 82 mg/dL (ref 70–99)
POTASSIUM: 4 mmol/L (ref 3.5–5.1)
SODIUM: 137 mmol/L (ref 136–145)

## 2023-02-04 LAB — ECG 12-LEAD (CHEAT LAKE CLINIC ONLY)
Atrial Rate: 64 {beats}/min
Calculated P Axis: 67 degrees
Calculated R Axis: 41 degrees
Calculated T Axis: 58 degrees
PR Interval: 142 ms
QRS Duration: 100 ms
QT Interval: 440 ms
QTC Calculation: 453 ms
Ventricular rate: 64 {beats}/min

## 2023-02-04 NOTE — Progress Notes (Signed)
Dermatology, [facility]  [address]  [place] [address]  [phone]    Date:   02/04/2023  Name: [name]  Age: 67 y.o.    Chief complaint: Skin lesions    HPI  [name] is a 67 y.o. 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 She has been treating the rash with Triamcinolone and feels it has improved. When asked about the lesion on her left lower leg, patient notes it has been present for multiple years. No additional skin-related complaints.     Review of Systems   Constitutional:  Negative for chills and fever.   Skin:  Positive for itching and rash.     Current Medications   acetaminophen (TYLENOL) 500 mg Oral Tablet Take 650 mg by mouth Every night Taking 2 daily    ascorbic acid, vitamin C, (VITAMIN C) 500 mg Oral Tablet Take 0.5 Tablets (250 mg total) by mouth Once a day    aspirin 81 mg Oral Tablet, Chewable Take 1 Tablet (81 mg total) by mouth Once a day    Bifidobacterium infantis (ALIGN ORAL) Take by mouth    Cholecalciferol, Vitamin D3, 50 mcg (2,000 unit) Oral Capsule Take 2,000 Units by mouth Once a day     clindamycin (CLEOCIN) 300 mg Oral Capsule 2 capsules (600 mg) by mouth one half hour to an hour before dental treatment    clobetasoL (TEMOVATE) 0.05 % Cream Apply topically Twice daily    Fluocinolone-Shower Cap 0.01 % Oil Apply to scalp 1-2 times weekly at night.    hydroCHLOROthiazide (HYDRODIURIL) 25 mg Oral Tablet Take 1 Tablet (25 mg total) by mouth Once a day    loratadine (CLARITIN) 10 mg Oral Tablet Take 1 Tablet (10 mg total) by mouth Once a day    metoprolol succinate (TOPROL-XL) 50 mg Oral Tablet Sustained Release 24 hr Take 1 Tablet (50 mg total) by mouth Once a day (Patient taking differently: Take 1 Tablet (50 mg total) by mouth Every  night)    mupirocin (BACTROBAN) 2 % Ointment Apply topically Three times a day to wound of left lower leg    ofloxacin (OCUFLOX) 0.3 % Ophthalmic Drops Ophthalmic Solution for use in surgical suite and 4 times daily into left eye as directed    prednisoLONE acetate (PRED FORTE) 1 % Ophthalmic Drops, Suspension for use in surgical suite and 4 times daily into left eye as directed    triamcinolone acetonide (ARISTOCORT A) 0.1 % Cream Apply topically Twice daily    triamcinolone acetonide 0.1 % Ointment Apply topically Twice per day as needed to rash on body. Use 2-4 weeks, then break.    warfarin (COUMADIN) 2 mg Oral Tablet Take 3 Tablets (6 mg total) by mouth Every evening Or as directed by Anticoag clinic     Allergies   Allergen Reactions    Latex      Added based on information entered during case entry, please review and add reactions, type, and severity as needed    Iv Contrast Anaphylaxis    Penicillin Itching, Diarrhea and Hives/ Urticaria    Oregano      Hives      Stadol [Butorphanol Tartrate] Mental Status Effect    Tomato Hives/ Urticaria  Past Medical History:   Diagnosis Date    Abdominal hernia 04/29/2020    hx of 3 repairs    Anemia     Aortic stenosis     Arthritis     Atrial fibrillation (CMS HCC) 04/29/2020    one episode  after AVR    Back problem 04/29/2020    LBP    Beta blocker prescribed for left ventricular systolic dysfunction     Blood in stool     positive cologuard.  Has not yet scheduled colonoscopy    Blood thinned due to long-term anticoagulant use 04/29/2020    coumadin    Cancer (CMS HCC)     skin cancer L lip.  Needs MOHS to be scheduled    Chronic pain 04/29/2020    back and right hip    Colon polyp     Dysrhythmias     1 episode of A-fib per patient    Heart murmur 01/04/2019    AVR    High blood pressure     Motion sickness     Obesity     Rash 04/29/2020    scalp psoriasis and involves R ear    Valvular disease     aortic valve stenosis, following with CT surgery, echo 02/2016  and cath 03/2016    Wears glasses      Physical Exam  Vitals: Temperature 37.2 C (98.9 F), temperature source Temporal, height 1.664 m (5' 5.5"), weight 102 kg (224 lb 6.9 oz), not currently breastfeeding.  Physical Exam  Constitutional:       General: She is not in acute distress.     Appearance: Normal appearance.   HENT:      Head:     Skin:     General: Skin is warm and dry.      Coloration: Skin is not pale.          Neurological:      Mental Status: She is alert and oriented to person, place, and time. Mental status is at baseline.      Coordination: Coordination normal.   Psychiatric:         Mood and Affect: Mood normal.     General skin exam was performed including head, neck, anterior/posterior trunk, bilateral upper & lower extremities and revealed no areas of concern other than those documented.    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 Reviewed risk of skin atrophy with prolonged use.    - Will follow    #Neoplasm of uncertain behavior (#3, body)    - Offered biopsy, patient opted to observe because of her upcoming cataract surgery  - Clinical photography obtained  - Will follow up in 3 months     The patient was educated on the importance of avoiding excessive sun exposure and wearing sunscreen daily.  Advised patient to re-apply sunscreen every 2-3 hours.  Advised the patient to avoid going to the tanning beds.  Advised to check skin routinely for any changes, especially any new moles or changes in existing moles.    RTC in 3 months, or  sooner if needed     This visit was rendered independently with a supervising physician on site.     I am scribing for, and in the presence of, Valin Massie,  APRN,FNP-BC for services provided on 02/04/2023.  Jeral Fruit, SCRIBE         I personally performed the services described in this documentation, as scribed  in my presence, and it is both accurate  and complete.    [employer], APRN,FNP-BC

## 2023-02-05 ENCOUNTER — Other Ambulatory Visit: Payer: Self-pay

## 2023-02-09 ENCOUNTER — Ambulatory Visit (HOSPITAL_BASED_OUTPATIENT_CLINIC_OR_DEPARTMENT_OTHER): Payer: Medicare Other | Admitting: Certified Registered"

## 2023-02-09 ENCOUNTER — Ambulatory Visit (HOSPITAL_COMMUNITY): Payer: Medicare Other | Admitting: Certified Registered"

## 2023-02-09 ENCOUNTER — Encounter (HOSPITAL_COMMUNITY): Payer: Medicare Other | Admitting: Ophthalmology

## 2023-02-09 ENCOUNTER — Inpatient Hospital Stay
Admission: RE | Admit: 2023-02-09 | Discharge: 2023-02-09 | Disposition: A | Discharge status: Home / Self Care | Payer: Medicare Other | Attending: Ophthalmology | Admitting: Ophthalmology

## 2023-02-09 ENCOUNTER — Encounter (HOSPITAL_COMMUNITY)
Admission: RE | Disposition: A | Discharge status: Home / Self Care | Payer: Self-pay | Source: Home / Self Care | Attending: Ophthalmology

## 2023-02-09 ENCOUNTER — Other Ambulatory Visit: Payer: Self-pay

## 2023-02-09 DIAGNOSIS — H25812 Combined forms of age-related cataract, left eye: Secondary | ICD-10-CM | POA: Insufficient documentation

## 2023-02-09 DIAGNOSIS — Z7982 Long term (current) use of aspirin: Secondary | ICD-10-CM | POA: Insufficient documentation

## 2023-02-09 HISTORY — PX: CATARACT EXTRACTION: SUR2

## 2023-02-09 SURGERY — PHACO WITH INTRAOCULAR LENS
Anesthesia: Monitor Anesthesia Care | Site: Eye | Laterality: Left | Wound class: Clean Wound: Uninfected operative wounds in which no inflammation occurred

## 2023-02-09 MED ORDER — CYCLOPENTOLATE 1 % EYE DROPS
1.0000 [drp] | OPHTHALMIC | Status: AC
Start: 2023-02-09 — End: 2023-02-09
  Administered 2023-02-09 (×3): 1 [drp] via OPHTHALMIC

## 2023-02-09 MED ORDER — TETRACAINE HCL (PF) 0.5 % EYE DROPS
1.0000 [drp] | Freq: Once | OPHTHALMIC | Status: DC | PRN
Start: 2023-02-09 — End: 2023-02-09
  Administered 2023-02-09: 1 [drp] via OPHTHALMIC

## 2023-02-09 MED ORDER — MOXIFLOXACIN 0.5% INJECTION FOR INTRACAMERAL USE
0.1000 mL | INJECTION | Freq: Once | Status: DC | PRN
Start: 2023-02-09 — End: 2023-02-09
  Administered 2023-02-09: 0.1 mL via INTRACAMERAL
  Filled 2023-02-09: qty 0.5

## 2023-02-09 MED ORDER — PROPARACAINE 0.5 % EYE DROPS
1.0000 [drp] | OPHTHALMIC | Status: AC
Start: 2023-02-09 — End: 2023-02-09
  Administered 2023-02-09 (×3): 1 [drp] via OPHTHALMIC

## 2023-02-09 MED ORDER — SODIUM CHLORIDE 0.9 % (FLUSH) INJECTION SYRINGE
2.0000 mL | INJECTION | Freq: Three times a day (TID) | INTRAMUSCULAR | Status: DC
Start: 2023-02-09 — End: 2023-02-09

## 2023-02-09 MED ORDER — MIDAZOLAM 1 MG/ML INJECTION SOLUTION
INTRAMUSCULAR | Status: AC
Start: 2023-02-09 — End: 2023-02-09
  Filled 2023-02-09: qty 2

## 2023-02-09 MED ORDER — OFLOXACIN 0.3 % EYE DROPS
1.0000 [drp] | Freq: Four times a day (QID) | OPHTHALMIC | Status: DC
Start: 2023-02-09 — End: 2023-12-22

## 2023-02-09 MED ORDER — DEXTROSE 5% IN WATER (D5W) FLUSH BAG - 250 ML
INTRAVENOUS | Status: DC | PRN
Start: 2023-02-09 — End: 2023-02-09

## 2023-02-09 MED ORDER — FENTANYL (PF) 50 MCG/ML INJECTION SOLUTION
Freq: Once | INTRAMUSCULAR | Status: DC | PRN
Start: 2023-02-09 — End: 2023-02-09
  Administered 2023-02-09 (×2): 50 ug via INTRAVENOUS

## 2023-02-09 MED ORDER — SODIUM CHLORIDE 0.9% FLUSH BAG - 250 ML
INTRAVENOUS | Status: DC | PRN
Start: 2023-02-09 — End: 2023-02-09

## 2023-02-09 MED ORDER — KETOROLAC 0.5 % EYE DROPS
1.0000 [drp] | OPHTHALMIC | Status: AC
Start: 2023-02-09 — End: 2023-02-09
  Administered 2023-02-09 (×3): 1 [drp] via OPHTHALMIC

## 2023-02-09 MED ORDER — PREDNISOLONE ACETATE 1 % EYE DROPS,SUSPENSION
1.0000 [drp] | Freq: Once | OPHTHALMIC | Status: DC | PRN
Start: 2023-02-09 — End: 2023-02-09

## 2023-02-09 MED ORDER — FENTANYL (PF) 50 MCG/ML INJECTION SOLUTION
INTRAMUSCULAR | Status: AC
Start: 2023-02-09 — End: 2023-02-09
  Filled 2023-02-09: qty 2

## 2023-02-09 MED ORDER — LACTATED RINGERS INTRAVENOUS SOLUTION
INTRAVENOUS | Status: DC
Start: 2023-02-09 — End: 2023-02-09

## 2023-02-09 MED ORDER — SODIUM CHLORIDE 0.9 % (FLUSH) INJECTION SYRINGE
2.0000 mL | INJECTION | INTRAMUSCULAR | Status: DC | PRN
Start: 2023-02-09 — End: 2023-02-09

## 2023-02-09 MED ORDER — PHENYLEPHRINE 2.5 % EYE DROPS
1.0000 [drp] | OPHTHALMIC | Status: AC
Start: 2023-02-09 — End: 2023-02-09
  Administered 2023-02-09 (×3): 1 [drp] via OPHTHALMIC

## 2023-02-09 MED ORDER — PREDNISOLONE ACETATE 1 % EYE DROPS,SUSPENSION
1.0000 [drp] | Freq: Four times a day (QID) | OPHTHALMIC | Status: DC
Start: 2023-02-09 — End: 2023-12-22

## 2023-02-09 MED ORDER — OFLOXACIN 0.3 % EYE DROPS
1.0000 [drp] | OPHTHALMIC | Status: AC
Start: 2023-02-09 — End: 2023-02-09
  Administered 2023-02-09 (×3): 1 [drp] via OPHTHALMIC

## 2023-02-09 MED ORDER — FENTANYL (PF) 50 MCG/ML INJECTION SOLUTION
12.5000 ug | INTRAMUSCULAR | Status: DC | PRN
Start: 2023-02-09 — End: 2023-02-09

## 2023-02-09 MED ORDER — ACETAMINOPHEN 325 MG TABLET
650.0000 mg | ORAL_TABLET | ORAL | Status: DC | PRN
Start: 2023-02-09 — End: 2023-02-09

## 2023-02-09 MED ORDER — LIDOCAINE 1 %-PHENYLEPHRIN 1.5 % IN BALANCED SALT(PF) INTRAOCULAR SOLN
1.0000 mL | INJECTION | Freq: Once | INTRAOCULAR | Status: DC | PRN
Start: 2023-02-09 — End: 2023-02-09
  Administered 2023-02-09: 1 mL via OPHTHALMIC
  Filled 2023-02-09: qty 1

## 2023-02-09 MED ORDER — FENTANYL (PF) 50 MCG/ML INJECTION SOLUTION
25.0000 ug | INTRAMUSCULAR | Status: DC | PRN
Start: 2023-02-09 — End: 2023-02-09

## 2023-02-09 MED ORDER — MIDAZOLAM (PF) 1 MG/ML INJECTION SOLUTION
Freq: Once | INTRAMUSCULAR | Status: DC | PRN
Start: 2023-02-09 — End: 2023-02-09
  Administered 2023-02-09: 2 mg via INTRAVENOUS

## 2023-02-09 MED ORDER — LACTATED RINGERS INTRAVENOUS SOLUTION
INTRAVENOUS | Status: DC
Start: 2023-02-09 — End: 2023-02-09
  Administered 2023-02-09: 0 mL via INTRAVENOUS

## 2023-02-09 MED ORDER — EPINEPHRINE HCL (PF) 1 MG/ML (1 ML) INJECTION SOLUTION
Freq: Once | INTRAMUSCULAR | Status: DC | PRN
Start: 2023-02-09 — End: 2023-02-09

## 2023-02-09 MED ORDER — BALANCED SALT SOLUTION COMBINATION NO.2 INTRAOCULAR IRRIGATION
15.0000 mL | Freq: Once | INTRAOCULAR | Status: DC | PRN
Start: 2023-02-09 — End: 2023-02-09
  Administered 2023-02-09: 15 mL via OPHTHALMIC

## 2023-02-09 MED ORDER — TROPICAMIDE 1 % EYE DROPS
1.0000 [drp] | OPHTHALMIC | Status: AC
Start: 2023-02-09 — End: 2023-02-09
  Administered 2023-02-09 (×3): 1 [drp] via OPHTHALMIC

## 2023-02-09 SURGICAL SUPPLY — 11 items
CANNULA OPTH 25GA 7MM FLAT TIP ANG HDSCT STRL DISP (MED SURG SUPPLIES) ×1 IMPLANT
CANNULA OPTH GRY 7/8IN 27GA VSTC 45D RND CORT HDSCT CLEAVE 11MM STRL LF (MED SURG SUPPLIES) ×1 IMPLANT
CYSTO OPTH 12MM 25GA ANG STRL DISP (OPHTHALMIC SUPPLIES (NOT LENS)) ×1 IMPLANT
GARTER EYE ASST_28-6536 (GLOVES AND ACCESSORIES) ×1 IMPLANT
LENS IOL +17.5 DIOP AUTONOME CLAREON ASPH HDRPHB ACRL STRL LF (IMPLANTS INTER-OCULAR LENS) ×1 IMPLANT
PACK CATARACT BX/6 (CUSTOM TRAYS & PACK) ×1 IMPLANT
PACK SOLUTION COMBO_MPI006102 6EA/CS (MEDICATIONS/SOLUTIONS) ×1 IMPLANT
SHIELD EYE 76.2X60.3MM ASST AL EY GRTR FOX CVR (OPHTHALMIC SUPPLIES (NOT LENS)) ×1 IMPLANT
SYRINGE 1ML LF  STRL ST TB DISP (MED SURG SUPPLIES) ×1 IMPLANT
TIP SUCT/IRRG .03MM INTREPID 35D BNT PLMR DISP (MED SURG SUPPLIES) ×1 IMPLANT
WIPE 3X3IN LF  ULTRACELL SURG INSTR SOLAN THK2MM (MED SURG SUPPLIES) ×1 IMPLANT

## 2023-02-09 NOTE — Discharge Summary (Signed)
Discharge Note  Patient meets discharge criteria  Discharge to home  Return to [facility] as scheduled or sooner if need be  Keep surgical dressing in place    Kathleen Argue, MD 02/09/2023, 11:30

## 2023-02-09 NOTE — Anesthesia Transfer of Care (Signed)
ANESTHESIA TRANSFER OF CARE   [name] is a 67 y.o. ,female, Weight: 101 kg (222 lb 0.1 oz)   had Procedure(s):  PHACO WITH INTRAOCULAR LENS  performed  02/09/23   Primary Service: Kathleen Argue, MD    Past Medical History:   Diagnosis Date    Abdominal hernia 04/29/2020    hx of 3 repairs    Anemia     Aortic stenosis     Arthritis     Atrial fibrillation (CMS HCC) 04/29/2020    one episode  after AVR    Back problem 04/29/2020    LBP    Beta blocker prescribed for left ventricular systolic dysfunction     Blood in stool     positive cologuard.  Has not yet scheduled colonoscopy    Blood thinned due to long-term anticoagulant use 04/29/2020    coumadin    Cancer (CMS HCC)     skin cancer L lip.  Needs MOHS to be scheduled    Chronic pain 04/29/2020    back and right hip    Colon polyp     Dysrhythmias     1 episode of A-fib per patient    Heart murmur 01/04/2019    AVR    High blood pressure     Motion sickness     Obesity     Rash 04/29/2020    scalp psoriasis and involves R ear    Valvular disease     aortic valve stenosis, following with CT surgery, echo 02/2016 and cath 03/2016    Wears glasses       Allergy History as of 02/09/23       BUTORPHANOL TARTRATE         Noted Status Severity Type Reaction    01/08/16 1311 Balinda Quails 01/08/16 Active   Mental Status Effect              IV CONTRAST         Noted Status Severity Type Reaction    01/08/16 1312 Balinda Quails 01/08/16 Active High  Anaphylaxis              TOMATO         Noted Status Severity Type Reaction    01/08/16 1312 Balinda Quails 01/08/16 Active Low  Hives/ Urticaria              OREGANO         Noted Status Severity Type Reaction    08/04/16 1129 Rozzell, Rayann Heman, RN 01/08/16 Active       Comments: Hives       01/08/16 1312 Wilson, Romie Jumper 01/08/16 Active                 PENICILLIN         Noted Status Severity Type Reaction    09/17/22 1034 Spring, Jayce, LPN 16/10/96 Active Medium  Itching, Diarrhea, Hives/ Urticaria               LATEX         Noted Status Severity Type Reaction    01/19/23 1130 Gabriel Carina [facility] 01/19/23 Active       Comments: Added based on information entered during case entry, please review and add reactions, type, and severity as needed                   I completed my transfer of care / handoff to the receiving personnel during which we discussed:  Access,  Airway, All key/critical aspects of case discussed, Analgesia, Antibiotics, Expectation of post procedure, Fluids/Product, Gave opportunity for questions and acknowledgement of understanding, Labs and PMHx      Post Location: Phase II                        Additional Info:REPORT TO PACU RN, PATIENT STABLE IN PACU                                     Last OR Temp: Temperature: 36.3 C (97.3 F)  ABG:  PH (ARTERIAL)   Date Value Ref Range Status   01/04/2019 7.33 (L) 7.35 - 7.45 Final     PCO2 (ARTERIAL)   Date Value Ref Range Status   01/04/2019 48.0 (H) 35.0 - 45.0 mm/Hg Final     PCO2 (PCO2P)   Date Value Ref Range Status   01/04/2019 53 (HH) 35 - 45 mmHg Final     PO2 (ARTERIAL)   Date Value Ref Range Status   01/04/2019 97.0 72.0 - 100.0 mm/Hg Final     PO2 (PO2P)   Date Value Ref Range Status   01/04/2019 383 (H) 72 - 100 mmHg Final     SODIUM   Date Value Ref Range Status   01/04/2019 134 (L) 137 - 145 mmol/L Final     POTASSIUM   Date Value Ref Range Status   02/04/2023 4.0 3.5 - 5.1 mmol/L Final   01/18/2019 4.4  Final     KETONES   Date Value Ref Range Status   12/29/2018 Negative Negative mg/dL Final     POTASSIUM, POC   Date Value Ref Range Status   01/04/2019 3.8 3.5 - 5.0 mmol/L Final     WHOLE BLOOD POTASSIUM   Date Value Ref Range Status   01/04/2019 3.5 3.5 - 4.6 mmol/L Final     CHLORIDE   Date Value Ref Range Status   01/04/2019 109 101 - 111 mmol/L Final     CALCIUM   Date Value Ref Range Status   02/04/2023 9.1 8.6 - 10.3 mg/dL Final     Comment:     Gadolinium-containing contrast can interfere with calcium measurement.     01/18/2019 9.1   Final     Calculated P Axis   Date Value Ref Range Status   02/02/2023 67 degrees Final     Calculated R Axis   Date Value Ref Range Status   02/02/2023 41 degrees Final     Calculated T Axis   Date Value Ref Range Status   02/02/2023 58 degrees Final     IONIZED CALCIUM   Date Value Ref Range Status   01/06/2019 1.14 1.10 - 1.35 mmol/L Final     IONIZED CALCIUM, POC   Date Value Ref Range Status   01/04/2019 1.05 (L) 1.30 - 1.46 mmol/L Final     LACTATE   Date Value Ref Range Status   01/04/2019 1.1 0.0 - 1.3 mmol/L Final     HEMOGLOBIN   Date Value Ref Range Status   01/04/2019 12.1 12.0 - 18.0 g/dL Final     OXYHEMOGLOBIN   Date Value Ref Range Status   01/04/2019 95.5 85.0 - 98.0 % Final     CARBOXYHEMOGLOBIN   Date Value Ref Range Status   01/04/2019 1.5 0.0 - 2.5 % Final     MET-HEMOGLOBIN   Date  Value Ref Range Status   01/04/2019 1.4 0.0 - 2.0 % Final     BASE EXCESS (BEP)   Date Value Ref Range Status   01/04/2019 3.0 -2.0 - 3.0 mEq/L Final     BASE DEFICIT   Date Value Ref Range Status   01/04/2019 1.1 0.0 - 3.0 mmol/L Final     BICARBONATE (ARTERIAL)   Date Value Ref Range Status   01/04/2019 24.0 18.0 - 26.0 mmol/L Final     HCO3 (HCO3P)   Date Value Ref Range Status   01/04/2019 29 (H) 22 - 26 mmol/L Final     Airway:* No LDAs found *  Blood pressure (!) 143/86, pulse 62, temperature 36.3 C (97.3 F), resp. rate 16, height 1.664 m (5' 5.5"), weight 101 kg (222 lb 0.1 oz), SpO2 98%, not currently breastfeeding.

## 2023-02-09 NOTE — Discharge Instructions (Signed)
SURGICAL DISCHARGE INSTRUCTIONS     Dr. Moore, Charles, MD  performed your PHACO WITH INTRAOCULAR LENS today at the [facility]    [facility]:  Monday through Friday from 6 a.m. - 7 p.m.: [phone]  Between 7 p.m. - 6 a.m., weekends and holidays:  Call [facility] at [phone] or [phone].    PLEASE SEE WRITTEN HANDOUTS AS DISCUSSED BY YOUR NURSE:      SIGNS AND SYMPTOMS OF A WOUND / INCISION INFECTION   Be sure to watch for the following:   Increase in redness or red streaks near or around the wound or incision.   Increase in pain that is intense or severe and cannot be relieved by the pain medication that your doctor has given you.   Increase in swelling that cannot be relieved by elevation of a body part, or by applying ice, if permitted.   Increase in drainage, or if yellow / green in color and smells bad. This could be on a dressing or a cast.   Increase in fever for longer than 24 hours, or an increase that is higher than 101 degrees Fahrenheit (normal body temperature is 98 degrees Fahrenheit). The incision may feel warm to the touch.    **CALL YOUR DOCTOR IF ONE OR MORE OF THESE SIGNS / SYMPTOMS SHOULD OCCUR.    ANESTHESIA INFORMATION   ANESTHESIA -- ADULT PATIENTS:  You have received intravenous sedation / general anesthesia, and you may feel drowsy and light-headed for several hours. You may even experience some forgetfulness of the procedure. DO NOT DRIVE A MOTOR VEHICLE or perform any activity requiring complete alertness or coordination until you feel fully awake in about 24-48 hours. Do not drink alcoholic beverages for at least 24 hours. Do not stay alone, you must have a responsible adult available to be with you. You may also experience a dry mouth or nausea for 24 hours. This is a normal side effect and will disappear as the effects of the medication wear off.    REMEMBER   If you experience any difficulty breathing, chest pain, bleeding that you feel is  excessive, persistent nausea or vomiting or for any other concerns:  Call your physician Dr. Moore at [phone] or [phone]. You may also ask to have the  doctor on call paged. They are available to you 24 hours a day.    SPECIAL INSTRUCTIONS / COMMENTS       FOLLOW-UP APPOINTMENTS   Please call patient services at [phone] or [phone] to schedule a date / time of return. They are open Monday - Friday from 7:30 am - 5:00 pm.

## 2023-02-09 NOTE — H&P (Signed)
[facility]                                                     H&P UPDATE FORM    Patient:  [name]  Med Record # [id number]  Date of Birth:   21-Nov-1955  Age:  67 y.o.   Gender: female    Date of Admission: 02/09/2023    Outpatient Pre-Surgical H & P updated the day of the procedure.  1.I reviewed the H&P completed in 30 days of today's surgical procedure by Hollice Gong, APRN,FNP-BC at 02/02/23 . Assessment remains unchanged based on completion of today's re-assessment.        Change in medications: No      Last Menstrual Period: No/not applicable      Comments:     2.  Patient continues to be appropiate candidate for planned surgical procedure. YES    Arma Heading, MD 02/09/2023, 10:07      I saw and examined the patient.  I reviewed the resident's note.  I agree with the findings and plan of care as documented in the resident's note.  Any exceptions/additions are edited/noted.    Kathleen Argue, MD

## 2023-02-09 NOTE — Anesthesia Preprocedure Evaluation (Signed)
ANESTHESIA PRE-OP EVALUATION  Planned Procedure: PHACO WITH INTRAOCULAR LENS (Left: Eye)  Review of Systems     anesthesia history negative     patient summary reviewed          Pulmonary     Cardiovascular    ECG reviewed ,       GI/Hepatic/Renal           Endo/Other         Neuro/Psych/MS        Cancer                        Physical Assessment      Airway       Mallampati: II    TM distance: >3 FB    Neck ROM: full  Mouth Opening: fair.  No Facial hair          Dental       Dentition intact             Pulmonary    Breath sounds clear to auscultation       Cardiovascular    Rhythm: regular  Rate: Normal       Other findings              Plan  ASA 3     Planned anesthesia type: MAC                               Anesthetic plan and risks discussed with patient  signed consent obtained      Use of blood products discussed with patient who consented to blood products.      Patient's NPO status is appropriate for Anesthesia.           Plan discussed with CRNA.               NPO today  Sinus rhythm  Anxious

## 2023-02-09 NOTE — OR Surgeon (Signed)
[facility]   DEPARTMENT OF OPHTHALMOLOGY   OPERATION SUMMARY     PATIENT NAME: [name] NUMBER: [id number]   DATE OF SERVICE: 02/09/2023   DATE OF BIRTH: 1956-04-27    PREOPERATIVE DIAGNOSIS: Cataract, left eye.NSC   H25.12  NAME OF PROCEDURE: Phacoemulsification of cataract with posterior chamber lens implant, left eye.  Cataract Removal with Implant   CPT [phone]  SURGEONS: Kathleen Argue MD (staff), Arma Heading, MD   (assistant).   ANESTHESIA: MAC, topical.   ESTIMATED BLOOD LOSS: Less than 1 drop.   COMPLICATIONS: There were no complications.   SPECIMENS: None.     DESCRIPTION OF PROCEDURE: In the preanesthesia area, the patient was given a drop of topical betadine and topical Tetracaine. The patient was taken back to the operating room suite.  Topical Akten (lidocaine gel) was applied.  A surgical Time-Out confirmed the correct patient, procedure, operative eye, and IOL model and power.  The eye was then prepped and draped in the usual sterile fashion. A speculum was placed in the eye. A disposable supersharp blade was used to make a 1-mm side port entry through clear cornea along the limbus, and 1 mL of 1% preservative-free Xylocaine was irrigated into the anterior chamber. The anterior chamber was filled with viscoelastic material. The keratome was used to make a 2.4-mm phaco port incision through clear cornea temporally along the limbus. The cystotome and capsulorrhexis forceps were used to create a continuous curvilinear capsulorrhexis. Hydrodissection was performed with balanced salt solution on a cannula. The tip of the phacoemulsification handpiece was introduced into the eye, and the lens nucleus was emulsified using a bimanual nuclear splitting technique.  The phaco tip was removed and the tip of the I/A handpiece introduced into the eye. The remaining lens cortical material was aspirated from the eye. The I/A tip was removed, and the capsular bag was inflated  with viscoelastic material. The posterior chamber lens implant, [id number] +17.5 diopters power was brought onto the field and inspected. It was without defect and was loaded into the lens injector. The tip of the injector was inserted into the eye, and the lens implant was placed in the capsular bag. The injector tip was removed and the tip of the I/A handpiece reintroduced into the eye. The remaining viscoelastic material was aspirated. The I/A tip was removed, and the anterior chamber was pressurized with balanced salt solution. The anterior wound margins were hydrated with balanced salt solution and  of Vigamox was instilled into the anterior chamber.  Weck-cel sponges were gently brushed against the incision and confirmed that there was no wound leakage.  The speculum was removed from the eye, and the eye was bandaged with a rigid shield. The patient was returned to same day surgery in excellent condition. Dr. Christell Constant was present for the key and critical portions of the case and immediately available at all times.      Arma Heading, MD  Resident   [place] Department of Ophthalmology     Brett Fairy, MD   Assistant Professor   [facility] Department of Ophthalmology  Implant Name Type Inv. Item Serial No. Manufacturer Lot No. LRB No. Used Action   LENS IOL +17.5 DIOP [employer]OME CL[employer] ASPH HDRPHB ACRL STRL LF - [id number]  LENS IOL +17.5 DIOP AUTONOME CLAREON ASPH HDRPHB ACRL STRL LF [id number] [employer] NA Left 1 Implanted     [facility]  DEPARTMENT OF OPHTHALMOLOGY - DISCHARGE NOTE  PATIENT NAME:  [name] NAME:  [id number]  DATE OF SERVICE:  02/09/2023  DATE OF BIRTH:  September 30, 1956    Patient meets discharge criteria.  Discharge to home  Keep shield in place.  RTC as scheduled or sooner prn any problem.     Arma Heading, MD 02/09/2023, 11:30

## 2023-02-09 NOTE — Progress Notes (Signed)
Procedure: Phacoemulsification of cataract with lens implant left eye(s).    Pre-op diagnosis: Cataract left eye(s).      The patient has been thoroughly counseled as to the indications, alternatives, and potential risks of the procedures including, but not limited to, failure of operation, need for further surgery, scar, complications from anesthesia, loss of vision, infection, bleeding, corneal abrasion.    The patient understands the procedure with its risks and benefits, and desires to proceed as outlined above.    Kathleen Argue, MD 02/09/2023, 10:51

## 2023-02-09 NOTE — Brief Op Note (Signed)
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    Surgeons: Bing Neighbors. Christell Constant, MD & Arma Heading, MD    Anesthesia: Topical anesthesia with monitored anesthesia care (MAC)    Estimated Blood Loss: Less than 1 drop    Specimens: None    Complications: None  @      Kathleen Argue, MD 02/09/2023, 11:30

## 2023-02-09 NOTE — Anesthesia Postprocedure Evaluation (Signed)
Anesthesia Post Op Evaluation    [name]  Procedure(s):  PHACO WITH INTRAOCULAR LENS    Last Vitals:Temperature: 36.3 C (97.3 F) (02/09/23 1132)  Heart Rate: 62 (02/09/23 1132)  BP (Non-Invasive): (!) 143/86 (02/09/23 1132)  Respiratory Rate: 16 (02/09/23 1132)  SpO2: 98 % (02/09/23 1132)    No notable events documented.    Patient is sufficiently recovered from the effects of anesthesia to participate in the evaluation and has returned to their pre-procedure level.           Anesthetic complications: no

## 2023-02-10 ENCOUNTER — Ambulatory Visit: Payer: Medicare Other | Attending: Ophthalmology | Admitting: Ophthalmology

## 2023-02-10 ENCOUNTER — Encounter (INDEPENDENT_AMBULATORY_CARE_PROVIDER_SITE_OTHER): Payer: Self-pay | Admitting: Ophthalmology

## 2023-02-10 DIAGNOSIS — Z961 Presence of intraocular lens: Secondary | ICD-10-CM | POA: Insufficient documentation

## 2023-02-10 NOTE — Progress Notes (Addendum)
[facility]  [address]  [place] [address]  Operated by [facility]         Patient Name: [name]  MRN#: [id number]  Birthdate: [birth year]    Date of Service: 02/10/2023    Chief Complaint    Post-op Eye         [name] is a 67 y.o. female who presents today for evaluation/consultation of:  HPI       Post-op Eye    In left eye.  Pain was noted as 0/10.  Vision is difficult to focus.             Comments    Pt is here today for 1 day pov s/o Phacoemulsification of cataract with lens implant left eye, OD scheduled for 02/23/23  Pt states that her eyes feel well. States that she slept well.   States that her eyes feels slightly sore.   Mild floaters.   Taking: Oflox 4x daily                Pred 4x daily OS - 3 drops yesterday.           Last edited by Caprice Renshaw, COA on 02/10/2023  9:35 AM.        ROS    Positive for: Eyes  Negative for: Constitutional, Gastrointestinal, Neurological, Skin, Genitourinary, Musculoskeletal, HENT, Endocrine, Cardiovascular, Respiratory, Psychiatric, Allergic/Imm, Heme/Lymph  Last edited by Caprice Renshaw, COA on 02/10/2023  9:35 AM.         All other systems Negative    Caprice Renshaw, COA  02/10/2023, 09:36    Base Eye Exam       Visual Acuity (Snellen - Linear)         Right Left    Dist sc  20/40 -2    Dist cc 20/30     Dist ph sc  20/25 +2    Dist ph cc 20/20       Correction: Glasses              Tonometry (Tonopen, 9:45 AM)         Right Left    Pressure 17 22              Pupils         Pupils Shape APD    Right PERRL Round None    Left PERRL Round pharm dil              Visual Fields (Counting fingers)         Right Left     Full Full              Extraocular Movement         Right Left     Full Full              Neuro/Psych       Oriented x3: Yes    Mood/Affect: Normal                  Slit Lamp and Fundus Exam       External Exam         Right Left    External Normal Normal              Slit Lamp Exam         Right  Left    Lids/Lashes Dermatochalasis - upper lid Dermatochalasis - upper lid    Conjunctiva/Sclera White  and quiet White and quiet    Cornea Clear trace edema    Anterior Chamber Deep and quiet 1+ cell    Iris Round and reactive Round and reactive    Lens Nuclear sclerosis PCIOL    Anterior Vitreous Normal PVD                  Refraction       Wearing Rx         Sphere Cylinder Axis Add    Right -4.25 +1.75 019 +2.50    Left -4.00 +1.25 173 +2.50      Type: PAL                    MD Addition to HPI:  Patient here for post op visit #1 left. Patient reports no pain following cataract surgery      This patient has a satisfactory Post-op visit #1 following cataract extraction with PCIOL left eye. There is mild corneal edema. There is mild elevation of the intraocular pressure..    Plan: Vigamox/Ocuflox 1 drop QID to operative eye. Pred Forte 1 Drop QID to operatve eye. Patient instructed in proper drop usage. Patient advised to wear shield over operative eye at bedtime for one week. Return to clinic in approximately one week. Patient instructed to call [facility] with any change in vision, pain, or redness between now and next visit. Patient is to avoid activities that might result in bumping the eye and patient is not to immerse head under water. Showering is permitted.     I reviewed and made necessary changes to the technician, resident or fellow note regarding CC/HPI/ROS/Past Family, Medical and Social Hx/Surg Hx.    Arma Heading, MD  02/10/2023, 10:05              I reviewed and made necessary changes to the technician, resident or fellow note regarding CC/HPI/ROS/Past Family, Medical and Social Hx/Surg Hx.  Kathleen Argue, MD 02/10/2023, 10:13      I saw and examined the patient.  I reviewed the resident's note.  I agree with the findings and plan of care as documented in the resident's note. I personally reviewed and interpreted all testing and/or imaging performed at this visit and agree with the resident  or fellow's interpretation. Any exceptions/additions are edited/noted.    Kathleen Argue, MD 02/10/2023, 10:13

## 2023-02-11 ENCOUNTER — Encounter (HOSPITAL_COMMUNITY): Payer: Self-pay

## 2023-02-11 ENCOUNTER — Ambulatory Visit (HOSPITAL_COMMUNITY): Payer: Self-pay

## 2023-02-11 LAB — PT/INR: INR: 2.3

## 2023-02-11 NOTE — Progress Notes (Signed)
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   INR Range: 1.5 - 2.0  Tab strength: warfarin 2mg    Contact 1: [phone] cell   Contact 2:   Lab: POC/[place] [facility]   Comments: Tylenol regularly    [facility] anticoag SCN: [name] Lyons RN ext [phone]    INR: 2.3  Current Dose: 6 mg Th/M, 4 mg all other days  Clinical Outcomes       Positives:  INR Above Goal          Patient Findings       Positives:  Change in medications (vitamins on hold until after cataract surgery completed.), Other complaints (cataract surgery on one completed; upcoming cataract surgery for other eye.)    Negatives:  Change in health, Change in diet/appetite          Plan: Vitamin C and D on hold for cataract surgery. 6 mg Mondays, 4 mg all other days; recheck in two weeks. Confirmed dosing and testing with patient via MyChart message.    Next INR Date: 5.9.24    [name] Alm, RN SCN  Anticoag clinic  Ext. [phone]         For additional information, see dosing calendar below.      Warfarin Therapy Instructions   April 2024 Details    Sun Loyal [place] Thu Fri Sat      1               2               3               4               5               6                 7               8               9               10               11    1.8   6 mg   See details      12      4 mg         13      4 mg           14      4 mg         15      6 mg         16      4 mg         17      4 mg         18      6 mg         19      4 mg         20      4 mg           21      4 mg         22  6 mg         23      4 mg         24      4 mg         25   2.3   4 mg   See details      26      4 mg         27      4 mg           28      4 mg         29      6 mg         30      4 mg              Date Details   04/11 Last INR check     INR: 1.8      04/25 This INR check     INR: 2.3              Warfarin Therapy Instructions   May 2024 Details    Sun Mon Tue Wed Thu Fri  Sat        1      4 mg         2      4 mg         3      4 mg         4      4 mg           5      4 mg         6      6 mg         7      4 mg         8      4 mg         9      6 mg         10               11                 12               13               14               15               16               17               18                 19               20               21               22               23               24                25  26               27               28               29               30               31                  Date Details   No additional details    Date of next INR:  02/25/2023

## 2023-02-15 ENCOUNTER — Ambulatory Visit (HOSPITAL_COMMUNITY): Payer: Self-pay | Admitting: Nurse Practitioner

## 2023-02-15 DIAGNOSIS — H25813 Combined forms of age-related cataract, bilateral: Secondary | ICD-10-CM

## 2023-02-17 ENCOUNTER — Ambulatory Visit: Payer: Medicare Other | Attending: Ophthalmology | Admitting: Ophthalmology

## 2023-02-17 ENCOUNTER — Other Ambulatory Visit: Payer: Self-pay

## 2023-02-17 ENCOUNTER — Encounter (INDEPENDENT_AMBULATORY_CARE_PROVIDER_SITE_OTHER): Payer: Self-pay | Admitting: Ophthalmology

## 2023-02-17 DIAGNOSIS — Z961 Presence of intraocular lens: Secondary | ICD-10-CM | POA: Insufficient documentation

## 2023-02-17 MED ORDER — OFLOXACIN 0.3 % EYE DROPS
OPHTHALMIC | 0 refills | Status: DC
Start: 2023-02-17 — End: 2023-02-23
  Filled 2023-02-17: qty 5, 14d supply, fill #0

## 2023-02-17 MED ORDER — PREDNISOLONE ACETATE 1 % EYE DROPS,SUSPENSION
OPHTHALMIC | 0 refills | Status: DC
Start: 2023-02-17 — End: 2023-12-22
  Filled 2023-02-17: qty 5, 14d supply, fill #0

## 2023-02-17 NOTE — Progress Notes (Addendum)
[facility]  [address]  [place] [address]  Operated by [facility]         Patient Name: [name]  MRN#: [id number]  Birthdate: 08-Jan-1956    Date of Service: 02/17/2023    Chief Complaint    Post-op Eye         [name] is a 67 y.o. female who presents today for evaluation/consultation of:  HPI       Post-op Eye    In left eye.             Comments    Pt is here today for 1wk pov s/o Phacoemulsification of cataract with lens implant left eye, OD scheduled for 02/23/23.  Pt states that her OS eye feels okay, feels that her eyes feel more tired.   States that she feels more scratchy than before.   Denies pain   Taking: pred TID OS              Oflox complete           Last edited by Caprice Renshaw, COA on 02/17/2023 10:31 AM.        ROS    Positive for: Eyes  Negative for: Constitutional, Gastrointestinal, Neurological, Skin, Genitourinary, Musculoskeletal, HENT, Endocrine, Cardiovascular, Respiratory, Psychiatric, Allergic/Imm, Heme/Lymph  Last edited by Caprice Renshaw, COA on 02/17/2023 10:31 AM.         All other systems Negative    Caprice Renshaw, COA  02/17/2023, 10:31  Base Eye Exam       Visual Acuity (Snellen - Linear)         Right Left    Dist sc  20/30 +2    Dist cc 20/30 +1     Dist ph sc  20/20 -1    Dist ph cc 20/25       Correction: Glasses              Tonometry (icare, 10:38 AM)         Right Left    Pressure 17 16              Pupils         Pupils APD    Right PERRL None    Left PERRL None              Visual Fields (Counting fingers)         Right Left     Full Full              Extraocular Movement         Right Left     Full Full              Neuro/Psych       Oriented x3: Yes    Mood/Affect: Normal                  Slit Lamp and Fundus Exam       External Exam         Right Left    External Normal Normal              Slit Lamp Exam         Right Left    Lids/Lashes Dermatochalasis - upper lid Dermatochalasis - upper lid    Conjunctiva/Sclera  White and quiet White and quiet    Cornea Clear Clear    Anterior Chamber Deep and quiet Deep and quiet  Iris Round and reactive Round and reactive    Lens Nuclear sclerosis PCIOL    Anterior Vitreous Normal PVD                    MD Addition to HPI: Patient here for post op visit #2 left. Patient reports that they have been compliant with post op drops since last visit         This patient has satisfactory post-op visit #2 following cataract extraction with lens implant of left eye.    Plan: Discontinue Vigamox/Ocuflox eye drops. Taper Pred Forte as follows: 1 drop to operative eye TID x 1 week, then BID x week, then Q Day x week, then stop. Discontinue eye shield at bedtime. Return to clinic in 3-4 weeks, do refraction then.       I reviewed and made necessary changes to the technician, resident or fellow note regarding CC/HPI/ROS/Past Family, Medical and Social Hx/Surg Hx.    Kathleen Argue, MD  02/17/2023, 10:58

## 2023-02-19 ENCOUNTER — Other Ambulatory Visit: Payer: Self-pay

## 2023-02-22 ENCOUNTER — Ambulatory Visit: Payer: Medicare Other | Attending: Nurse Practitioner | Admitting: Nurse Practitioner

## 2023-02-22 ENCOUNTER — Other Ambulatory Visit: Payer: Self-pay

## 2023-02-22 ENCOUNTER — Encounter (HOSPITAL_COMMUNITY): Payer: Self-pay | Admitting: Nurse Practitioner

## 2023-02-22 VITALS — BP 145/80 | HR 73 | Temp 97.1°F | Ht 65.0 in | Wt 222.7 lb

## 2023-02-22 DIAGNOSIS — I10 Essential (primary) hypertension: Secondary | ICD-10-CM | POA: Insufficient documentation

## 2023-02-22 DIAGNOSIS — Z952 Presence of prosthetic heart valve: Secondary | ICD-10-CM | POA: Insufficient documentation

## 2023-02-22 DIAGNOSIS — I350 Nonrheumatic aortic (valve) stenosis: Secondary | ICD-10-CM | POA: Insufficient documentation

## 2023-02-22 MED ORDER — METOPROLOL SUCCINATE ER 50 MG TABLET,EXTENDED RELEASE 24 HR
50.0000 mg | ORAL_TABLET | Freq: Every evening | ORAL | 3 refills | Status: DC
Start: 2023-02-22 — End: 2023-08-05

## 2023-02-22 MED ORDER — WARFARIN 2 MG TABLET
6.0000 mg | ORAL_TABLET | Freq: Every evening | ORAL | 3 refills | Status: DC
Start: 2023-02-22 — End: 2024-03-16

## 2023-02-22 NOTE — Progress Notes (Signed)
02/22/2023     CHIEF COMPLAINT: Follow-up    SUBJECTIVE:   This is a very pleasant 67 y.o. year old female with a past medical history significant for severe AS s/p mechanical AVR On-X 21mm, mild MR, aortic dilation, former tobacco dependence.  She presents for annual follow-up today.  Tells me she has been very cardiovascular perspective since her evaluation.  She does not have any history of coronary artery disease per cardiac catheterization prior to aortic valve surgery.  She denies any congestive symptoms today nor any near syncope or presyncope.  She reports her blood pressure typically is well-controlled on her current regimen.  She does request refills today for Toprol and warfarin due to changes in her insurance coverage.  She reports she has an upcoming cataract procedure within the next few weeks, she does not require interruption in her warfarin therapy for this.  She otherwise verbalized an understanding of the need for SBE prophylaxis.  On presentation she denies chest pain, shortness of breath, dizziness, lightheadedness, syncope or near syncope, PND, orthopnea, or lower extremity edema.  She expresses no further cardiovascular concerns or complaints at present time.    Last Transthoracic Echocardiogram (01/08/20) revealed:   Findings  Left Ventricle:   Normal left ventricular size. Concentric remodeling. Left ventricular systolic function is normal. Ejection Fraction is 63.1 %. No segmental/regional wall motion abnormalities  identified. Left ventricular diastolic parameters are normal.  Right Ventricle:   Normal right ventricular size. Normal right ventricular systolic function. Right ventricular systolic pressure is indeterminate.  Left Atrium:   Severely dilated left atrium.  Right Atrium:   The right atrium is of normal size.  Mitral Valve:   There is mild mitral regurgitation.  Tricuspid Valve:   There is no evidence of tricuspid regurgitation.  Aortic Valve:   The aortic valve is not well  visualized. No Aortic valve stenosis. There is mild aortic regurgitation. AVR, 21mm On-X, mechanical valve is noted. The mean and peak gradients across AV  of 9 mm Hg and 16 mm Hg. Peak velocity across AV of 2 m/sec. DVI of 0.46 and AT of 60 msec.  Pulmonic Valve:   The pulmonic valve is not well visualized. There is no evidence of pulmonic valve regurgitation.  Pulmonary Artery:   The pulmonary artery is not well visualized.  Atrial Septum:   The interatrial septum is normal in appearance.  IVC/Hepatic Veins:   The inferior vena cava is of normal size.  Aorta:   The ascending aorta is mildly dilated. The aortic root is not well visualized.  Pericardium/Pleural space:   Normal pericardium with no pericardial effusion.    Last Cardiac Catheterization (12/06/18) revealed:   IMPRESSION:  1. Normal epicardial coronary arteries.  2. Codominant coronary circulation.      CURRENT MEDICATIONS:  Current Outpatient Medications   Medication Sig    acetaminophen (TYLENOL) 500 mg Oral Tablet Take 650 mg by mouth Every night Taking 2 daily    ascorbic acid, vitamin C, (VITAMIN C) 500 mg Oral Tablet Take 0.5 Tablets (250 mg total) by mouth Once a day    aspirin 81 mg Oral Tablet, Chewable Take 1 Tablet (81 mg total) by mouth Once a day    Bifidobacterium infantis (ALIGN ORAL) Take by mouth    Cholecalciferol, Vitamin D3, 50 mcg (2,000 unit) Oral Capsule Take 2,000 Units by mouth Once a day     clindamycin (CLEOCIN) 300 mg Oral Capsule 2 capsules (600 mg) by mouth one half hour  to an hour before dental treatment    clobetasoL (TEMOVATE) 0.05 % Cream Apply topically Twice daily    Fluocinolone-Shower Cap 0.01 % Oil Apply to scalp 1-2 times weekly at night.    hydroCHLOROthiazide (HYDRODIURIL) 25 mg Oral Tablet Take 1 Tablet (25 mg total) by mouth Once a day    loratadine (CLARITIN) 10 mg Oral Tablet Take 1 Tablet (10 mg total) by mouth Once a day    metoprolol succinate (TOPROL-XL) 50 mg Oral Tablet Sustained Release 24 hr Take 1  Tablet (50 mg total) by mouth Every night    mupirocin (BACTROBAN) 2 % Ointment Apply topically Three times a day to wound of left lower leg    ofloxacin (OCUFLOX) 0.3 % Ophthalmic Drops Ophthalmic Solution Administer 1 Drop into the left eye Four times a day    ofloxacin (OCUFLOX) 0.3 % Ophthalmic Drops Ophthalmic Solution for use in surgical suite and 4 times daily into right eye as directed    prednisoLONE acetate (PRED FORTE) 1 % Ophthalmic Drops, Suspension Administer 1 Drop into the left eye Four times a day    prednisoLONE acetate (PRED FORTE) 1 % Ophthalmic Drops, Suspension For use in surgical suite and 4 times daily into right eye as directed    triamcinolone acetonide (ARISTOCORT A) 0.1 % Cream Apply topically Twice daily    triamcinolone acetonide 0.1 % Ointment Apply topically Twice per day as needed to rash on body. Use 2-4 weeks, then break.    warfarin (COUMADIN) 2 mg Oral Tablet Take 3 Tablets (6 mg total) by mouth Every evening Or as directed by Anticoag clinic       REVIEW OF SYSTEMS: Other than ROS in the HPI, all other systems were negative.  ROS   VITAL SIGNS:  BP (!) 145/80   Pulse 73   Temp 36.2 C (97.1 F)   Ht 1.651 m (5\' 5" )   Wt 101 kg (222 lb 10.6 oz)   SpO2 98%   BMI 37.05 kg/m       PHYSICAL EXAM:  General: No acute distress and appears stated age.    HENT:ENT without erythema or injection, mucouse membranes moist.    Neck: No JVD, no carotid bruit. and supple, symmetrical, trachea midline.   Lungs: Clear to auscultation bilaterally.    Cardiovascular: Regular rate and rhythm without murmur and vascular pulses are 2+ throughout.    Abdomen: Soft, non-tender and bowel sounds normal.    Extremities: Extremities normal, atraumatic, no cyanosis or edema.    Skin: Skin warm and dry.    Neurologic: Alert and oriented x3.    Lab Results   Component Value Date    CHOLESTEROL 175 02/04/2023    HDLCHOL 70 02/04/2023    LDLCHOL 92 02/04/2023    TRIG 68 02/04/2023        Lab Results    Component Value Date    HA1C 5.2 11/15/2020       ASSESSMENT & PLAN:    1.  AS s/p mechanical AVR 21mm On-X   2.  HTN  3.  Chronic Anticoagolation    This is a very pleasant 67 year old female presents to cardiology clinic today for annual follow-up.  Her history is as outlined above.  She is currently clinically stable and asymptomatic.  Provided her with refills for Toprol and warfarin today.  All of her most recent laboratory values were reviewed today as well.  She understands the need for SBE prophylaxis associated with her mechanical aortic  valve.  She has not due for updated imaging at the present time.  Encouraged her to monitor her blood pressure with a goal in mind of consistently less than 140/90.  She will also monitor her dietary intake of sodium.  She otherwise does not require further cardiovascular testing presently.  She will reach out with new or concerning symptoms.    The patient was seen independently with supervising physician not present but available for consultation.    Kermit Balo. Esmay Amspacher MSN, APRN, FNP-C  Cardiology Nurse Practitioner  [facility]

## 2023-02-23 ENCOUNTER — Encounter (HOSPITAL_COMMUNITY): Payer: Medicare Other | Admitting: Ophthalmology

## 2023-02-23 ENCOUNTER — Encounter (HOSPITAL_COMMUNITY)
Admission: RE | Disposition: A | Discharge status: Home / Self Care | Payer: Self-pay | Source: Ambulatory Visit | Attending: Ophthalmology

## 2023-02-23 ENCOUNTER — Other Ambulatory Visit (HOSPITAL_BASED_OUTPATIENT_CLINIC_OR_DEPARTMENT_OTHER): Payer: Self-pay | Admitting: Family

## 2023-02-23 ENCOUNTER — Inpatient Hospital Stay
Admission: RE | Admit: 2023-02-23 | Discharge: 2023-02-23 | Disposition: A | Discharge status: Home / Self Care | Payer: Medicare Other | Source: Ambulatory Visit | Attending: Ophthalmology | Admitting: Ophthalmology

## 2023-02-23 ENCOUNTER — Ambulatory Visit (HOSPITAL_BASED_OUTPATIENT_CLINIC_OR_DEPARTMENT_OTHER): Payer: Medicare Other | Admitting: Anesthesiology

## 2023-02-23 ENCOUNTER — Ambulatory Visit (HOSPITAL_COMMUNITY): Payer: Medicare Other | Admitting: Anesthesiology

## 2023-02-23 ENCOUNTER — Encounter (HOSPITAL_COMMUNITY): Payer: Self-pay | Admitting: Ophthalmology

## 2023-02-23 DIAGNOSIS — Z7901 Long term (current) use of anticoagulants: Secondary | ICD-10-CM | POA: Insufficient documentation

## 2023-02-23 DIAGNOSIS — Z952 Presence of prosthetic heart valve: Secondary | ICD-10-CM | POA: Insufficient documentation

## 2023-02-23 DIAGNOSIS — I10 Essential (primary) hypertension: Secondary | ICD-10-CM | POA: Insufficient documentation

## 2023-02-23 DIAGNOSIS — Z961 Presence of intraocular lens: Secondary | ICD-10-CM | POA: Insufficient documentation

## 2023-02-23 DIAGNOSIS — H2511 Age-related nuclear cataract, right eye: Secondary | ICD-10-CM | POA: Insufficient documentation

## 2023-02-23 DIAGNOSIS — Z9842 Cataract extraction status, left eye: Secondary | ICD-10-CM | POA: Insufficient documentation

## 2023-02-23 HISTORY — PX: HX CATARACT REMOVAL: SHX102

## 2023-02-23 SURGERY — PHACO WITH INTRAOCULAR LENS
Anesthesia: Monitor Anesthesia Care | Site: Eye | Laterality: Right | Wound class: Clean Wound: Uninfected operative wounds in which no inflammation occurred

## 2023-02-23 MED ORDER — MIDAZOLAM (PF) 1 MG/ML INJECTION SOLUTION
Freq: Once | INTRAMUSCULAR | Status: DC | PRN
Start: 2023-02-23 — End: 2023-02-23
  Administered 2023-02-23: 2 mg via INTRAVENOUS

## 2023-02-23 MED ORDER — PREDNISOLONE ACETATE 1 % EYE DROPS,SUSPENSION
1.0000 [drp] | Freq: Four times a day (QID) | OPHTHALMIC | Status: DC
Start: 2023-02-23 — End: 2023-12-22

## 2023-02-23 MED ORDER — MONTELUKAST 10 MG TABLET
10.0000 mg | ORAL_TABLET | Freq: Every evening | ORAL | 3 refills | Status: DC
Start: 2023-02-23 — End: 2023-03-03

## 2023-02-23 MED ORDER — DEXTROSE 5% IN WATER (D5W) FLUSH BAG - 250 ML
INTRAVENOUS | Status: DC | PRN
Start: 2023-02-23 — End: 2023-02-23

## 2023-02-23 MED ORDER — PROCHLORPERAZINE EDISYLATE 10 MG/2 ML (5 MG/ML) INJECTION SOLUTION
5.0000 mg | Freq: Once | INTRAMUSCULAR | Status: DC | PRN
Start: 2023-02-23 — End: 2023-02-23

## 2023-02-23 MED ORDER — PROPARACAINE 0.5 % EYE DROPS
1.0000 [drp] | OPHTHALMIC | Status: AC
Start: 2023-02-23 — End: 2023-02-23
  Administered 2023-02-23 (×3): 1 [drp] via OPHTHALMIC

## 2023-02-23 MED ORDER — FENTANYL (PF) 50 MCG/ML INJECTION SOLUTION
12.5000 ug | INTRAMUSCULAR | Status: DC | PRN
Start: 2023-02-23 — End: 2023-02-23

## 2023-02-23 MED ORDER — SODIUM CHLORIDE 0.9 % (FLUSH) INJECTION SYRINGE
2.0000 mL | INJECTION | INTRAMUSCULAR | Status: DC | PRN
Start: 2023-02-23 — End: 2023-02-23

## 2023-02-23 MED ORDER — LACTATED RINGERS INTRAVENOUS SOLUTION
INTRAVENOUS | Status: DC
Start: 2023-02-23 — End: 2023-02-23
  Administered 2023-02-23: 0 mL via INTRAVENOUS

## 2023-02-23 MED ORDER — OFLOXACIN 0.3 % EYE DROPS
1.0000 [drp] | OPHTHALMIC | Status: AC
Start: 2023-02-23 — End: 2023-02-23
  Administered 2023-02-23 (×3): 1 [drp] via OPHTHALMIC

## 2023-02-23 MED ORDER — FENTANYL (PF) 50 MCG/ML INJECTION SOLUTION
INTRAMUSCULAR | Status: AC
Start: 2023-02-23 — End: 2023-02-23
  Filled 2023-02-23: qty 2

## 2023-02-23 MED ORDER — SODIUM CHLORIDE 0.9 % (FLUSH) INJECTION SYRINGE
2.0000 mL | INJECTION | Freq: Three times a day (TID) | INTRAMUSCULAR | Status: DC
Start: 2023-02-23 — End: 2023-02-23

## 2023-02-23 MED ORDER — FENTANYL (PF) 50 MCG/ML INJECTION SOLUTION
25.0000 ug | INTRAMUSCULAR | Status: DC | PRN
Start: 2023-02-23 — End: 2023-02-23

## 2023-02-23 MED ORDER — TROPICAMIDE 1 % EYE DROPS
1.0000 [drp] | OPHTHALMIC | Status: AC
Start: 2023-02-23 — End: 2023-02-23
  Administered 2023-02-23 (×3): 1 [drp] via OPHTHALMIC

## 2023-02-23 MED ORDER — SODIUM CHLORIDE 0.9% FLUSH BAG - 250 ML
INTRAVENOUS | Status: DC | PRN
Start: 2023-02-23 — End: 2023-02-23

## 2023-02-23 MED ORDER — PREDNISOLONE ACETATE 1 % EYE DROPS,SUSPENSION
1.0000 [drp] | Freq: Once | OPHTHALMIC | Status: DC | PRN
Start: 2023-02-23 — End: 2023-02-23

## 2023-02-23 MED ORDER — PHENYLEPHRINE 2.5 % EYE DROPS
1.0000 [drp] | OPHTHALMIC | Status: AC
Start: 2023-02-23 — End: 2023-02-23
  Administered 2023-02-23 (×3): 1 [drp] via OPHTHALMIC

## 2023-02-23 MED ORDER — BALANCED SALT SOLUTION COMBINATION NO.2 INTRAOCULAR IRRIGATION
Freq: Once | INTRAOCULAR | Status: DC | PRN
Start: 2023-02-23 — End: 2023-02-23

## 2023-02-23 MED ORDER — FENTANYL (PF) 50 MCG/ML INJECTION SOLUTION
Freq: Once | INTRAMUSCULAR | Status: DC | PRN
Start: 2023-02-23 — End: 2023-02-23
  Administered 2023-02-23 (×2): 25 ug via INTRAVENOUS

## 2023-02-23 MED ORDER — CYCLOPENTOLATE 1 % EYE DROPS
1.0000 [drp] | OPHTHALMIC | Status: AC
Start: 2023-02-23 — End: 2023-02-23
  Administered 2023-02-23 (×3): 1 [drp] via OPHTHALMIC

## 2023-02-23 MED ORDER — OFLOXACIN 0.3 % EYE DROPS
1.0000 [drp] | Freq: Four times a day (QID) | OPHTHALMIC | Status: DC
Start: 2023-02-23 — End: 2023-12-22

## 2023-02-23 MED ORDER — ACETAMINOPHEN 325 MG TABLET
650.0000 mg | ORAL_TABLET | ORAL | Status: DC | PRN
Start: 2023-02-23 — End: 2023-02-23
  Filled 2023-02-23: qty 2

## 2023-02-23 MED ORDER — ONDANSETRON HCL (PF) 4 MG/2 ML INJECTION SOLUTION
4.0000 mg | Freq: Once | INTRAMUSCULAR | Status: DC | PRN
Start: 2023-02-23 — End: 2023-02-23

## 2023-02-23 MED ORDER — LACTATED RINGERS INTRAVENOUS SOLUTION
INTRAVENOUS | Status: DC
Start: 2023-02-23 — End: 2023-02-23

## 2023-02-23 MED ORDER — ONDANSETRON HCL (PF) 4 MG/2 ML INJECTION SOLUTION
Freq: Once | INTRAMUSCULAR | Status: DC | PRN
Start: 2023-02-23 — End: 2023-02-23
  Administered 2023-02-23: 4 mg via INTRAVENOUS

## 2023-02-23 MED ORDER — KETOROLAC 0.5 % EYE DROPS
1.0000 [drp] | OPHTHALMIC | Status: AC
Start: 2023-02-23 — End: 2023-02-23
  Administered 2023-02-23 (×3): 1 [drp] via OPHTHALMIC

## 2023-02-23 MED ORDER — TETRACAINE HCL (PF) 0.5 % EYE DROPS
1.0000 [drp] | Freq: Once | OPHTHALMIC | Status: DC | PRN
Start: 2023-02-23 — End: 2023-02-23
  Administered 2023-02-23: 1 [drp] via OPHTHALMIC

## 2023-02-23 MED ORDER — LIDOCAINE 1 %-PHENYLEPHRIN 1.5 % IN BALANCED SALT(PF) INTRAOCULAR SOLN
1.0000 mL | INJECTION | Freq: Once | INTRAOCULAR | Status: DC | PRN
Start: 2023-02-23 — End: 2023-02-23
  Administered 2023-02-23: 1 mL via OPHTHALMIC
  Filled 2023-02-23: qty 1

## 2023-02-23 MED ORDER — MOXIFLOXACIN 0.5% INJECTION FOR INTRACAMERAL USE
0.1000 mL | INJECTION | Freq: Once | Status: DC | PRN
Start: 2023-02-23 — End: 2023-02-23
  Administered 2023-02-23: 0.1 mL via INTRACAMERAL
  Filled 2023-02-23: qty 0.5

## 2023-02-23 MED ORDER — DEXAMETHASONE SODIUM PHOSPHATE (PF) 10 MG/ML INJECTION SOLUTION
4.0000 mg | Freq: Once | INTRAMUSCULAR | Status: DC | PRN
Start: 2023-02-23 — End: 2023-02-23

## 2023-02-23 MED ORDER — MIDAZOLAM 1 MG/ML INJECTION SOLUTION
INTRAMUSCULAR | Status: AC
Start: 2023-02-23 — End: 2023-02-23
  Filled 2023-02-23: qty 2

## 2023-02-23 MED ORDER — BALANCED SALT SOLUTION COMBINATION NO.2 INTRAOCULAR IRRIGATION
15.0000 mL | Freq: Once | INTRAOCULAR | Status: DC | PRN
Start: 2023-02-23 — End: 2023-02-23
  Administered 2023-02-23: 15 mL via OPHTHALMIC

## 2023-02-23 SURGICAL SUPPLY — 11 items
CANNULA OPTH 25GA 7MM FLAT TIP ANG HDSCT STRL DISP (MED SURG SUPPLIES) ×1 IMPLANT
CANNULA OPTH GRY 7/8IN 27GA VSTC 45D RND CORT HDSCT CLEAVE 11MM STRL LF (MED SURG SUPPLIES) ×1 IMPLANT
CYSTO OPTH 12MM 25GA ANG STRL DISP (OPHTHALMIC SUPPLIES (NOT LENS)) ×1 IMPLANT
GARTER EYE ASST_28-6536 (GLOVES AND ACCESSORIES) ×1 IMPLANT
LENS IOL +18.5 DIOP AUTONOME CLAREON ASPH HDRPHB ACRL STRL LF (IMPLANTS INTER-OCULAR LENS) ×1 IMPLANT
PACK CATARACT BX/6 (CUSTOM TRAYS & PACK) ×1 IMPLANT
PACK SOLUTION COMBO_MPI006102 6EA/CS (MEDICATIONS/SOLUTIONS) ×1 IMPLANT
SHIELD EYE 76.2X60.3MM ASST AL EY GRTR FOX CVR (OPHTHALMIC SUPPLIES (NOT LENS)) ×1 IMPLANT
SYRINGE 1ML LF  STRL ST TB DISP (MED SURG SUPPLIES) ×1 IMPLANT
TIP SUCT/IRRG .03MM INTREPID 35D BNT PLMR DISP (MED SURG SUPPLIES) ×1 IMPLANT
WIPE 3X3IN LF  ULTRACELL SURG INSTR SOLAN THK2MM (MED SURG SUPPLIES) ×1 IMPLANT

## 2023-02-23 NOTE — Anesthesia Postprocedure Evaluation (Signed)
Anesthesia Post Op Evaluation    [name]  Procedure(s):  PHACO WITH INTRAOCULAR LENS    Last Vitals:Temperature: 36 C (96.8 F) (02/23/23 1050)  Heart Rate: 68 (02/23/23 1050)  BP (Non-Invasive): 136/76 (02/23/23 1050)  Respiratory Rate: 17 (02/23/23 1050)  SpO2: 97 % (02/23/23 1050)    No notable events documented.    Patient is sufficiently recovered from the effects of anesthesia to participate in the evaluation and has returned to their pre-procedure level.  Patient location during evaluation: PACU       Patient participation: complete - patient participated  Level of consciousness: awake and alert and responsive to verbal stimuli    Pain management: adequate  Airway patency: patent    Anesthetic complications: no  Cardiovascular status: acceptable  Respiratory status: acceptable  Hydration status: acceptable  Patient post-procedure temperature: Pt Normothermic   PONV Status: Absent

## 2023-02-23 NOTE — Anesthesia Preprocedure Evaluation (Signed)
ANESTHESIA PRE-OP EVALUATION  Planned Procedure: PHACO WITH INTRAOCULAR LENS (Right: Eye)  Review of Systems         patient summary reviewed          Pulmonary     Cardiovascular    No peripheral edema,        GI/Hepatic/Renal           Endo/Other         Neuro/Psych/MS        Cancer                        Physical Assessment      Airway       Mallampati: I    TM distance: >3 FB    Neck ROM: full  Mouth Opening: good.            Dental       Dentition intact             Pulmonary    Breath sounds clear to auscultation  (-) no rhonchi, no decreased breath sounds, no wheezes, no rales and no stridor     Cardiovascular    Rhythm: regular  Rate: Normal  (-) no friction rub, carotid bruit is not present, no peripheral edema and no murmur     Other findings              Plan  ASA 3     Planned anesthesia type: MAC           PONV Plan:  I plan to administer pharmcologic prophalaxis antiemetics              Intravenous induction     Anesthesia issues/risks discussed are: PONV and Aspiration.  Anesthetic plan and risks discussed with patient           Patient's NPO status is appropriate for Anesthesia.           Plan discussed with CRNA.    (Plan    S.Mon  MAC    Debby Bud, MD)

## 2023-02-23 NOTE — Discharge Instructions (Signed)
SURGICAL DISCHARGE INSTRUCTIONS     Dr. Moore, Charles, MD  performed your PHACO WITH INTRAOCULAR LENS today at the [facility]    [facility]:  Monday through Friday from 6 a.m. - 7 p.m.: [phone]  Between 7 p.m. - 6 a.m., weekends and holidays:  Call [facility] at [phone] or [phone].    PLEASE SEE WRITTEN HANDOUTS AS DISCUSSED BY YOUR NURSE:      SIGNS AND SYMPTOMS OF A WOUND / INCISION INFECTION   Be sure to watch for the following:   Increase in redness or red streaks near or around the wound or incision.   Increase in pain that is intense or severe and cannot be relieved by the pain medication that your doctor has given you.   Increase in swelling that cannot be relieved by elevation of a body part, or by applying ice, if permitted.   Increase in drainage, or if yellow / green in color and smells bad. This could be on a dressing or a cast.   Increase in fever for longer than 24 hours, or an increase that is higher than 101 degrees Fahrenheit (normal body temperature is 98 degrees Fahrenheit). The incision may feel warm to the touch.    **CALL YOUR DOCTOR IF ONE OR MORE OF THESE SIGNS / SYMPTOMS SHOULD OCCUR.    ANESTHESIA INFORMATION   ANESTHESIA -- ADULT PATIENTS:  You have received intravenous sedation / general anesthesia, and you may feel drowsy and light-headed for several hours. You may even experience some forgetfulness of the procedure. DO NOT DRIVE A MOTOR VEHICLE or perform any activity requiring complete alertness or coordination until you feel fully awake in about 24-48 hours. Do not drink alcoholic beverages for at least 24 hours. Do not stay alone, you must have a responsible adult available to be with you. You may also experience a dry mouth or nausea for 24 hours. This is a normal side effect and will disappear as the effects of the medication wear off.    REMEMBER   If you experience any difficulty breathing, chest pain, bleeding that you feel is  excessive, persistent nausea or vomiting or for any other concerns:  Call your physician Dr. Moore at [phone] or [phone]. You may also ask to have the  doctor on call paged. They are available to you 24 hours a day.    SPECIAL INSTRUCTIONS / COMMENTS       FOLLOW-UP APPOINTMENTS   Please call patient services at [phone] or [phone] to schedule a date / time of return. They are open Monday - Friday from 7:30 am - 5:00 pm.

## 2023-02-23 NOTE — Nursing Note (Signed)
Refill Request: [facility]  Pharmacy requests refill of singulair.    1.  Last visit at [facility]:  02/02/2023  2.  Last My Telemed/ My Chart Visit: Visit date not found   3.  Next pending visit [facility]: 03/03/2023  4.  Was last visit within the past year: yes  5.  If not seen within the last year was appointment scheduled: yes  6.  Requested Pharmacy: [facility] [address]    Medication pended to provider for approval.    Romeo Rabon, CMA 02/23/2023, 13:52

## 2023-02-23 NOTE — Brief Op Note (Signed)
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    Surgeons: Bing Neighbors. Christell Constant, MD & Arma Heading, MD    Anesthesia: Topical anesthesia with monitored anesthesia care (MAC)    Estimated Blood Loss: Less than 1 drop    Specimens: None    Complications: None  @OPHTHPI @      Kathleen Argue, MD 02/23/2023, 10:29

## 2023-02-23 NOTE — OR Surgeon (Signed)
[facility]   DEPARTMENT OF OPHTHALMOLOGY   OPERATION SUMMARY     PATIENT NAME: [name] NUMBER: [id number]   DATE OF SERVICE: 02/23/2023   DATE OF BIRTH: [birth year]    PREOPERATIVE DIAGNOSIS: Cataract, right eye.NSC   H25.11    NAME OF PROCEDURE: Phacoemulsification of cataract with posterior chamber lens implant, right eye.  Cataract Removal with Implant   CPT [phone]  SURGEONS: Kathleen Argue MD (staff), Arma Heading, MD   (assistant).   ANESTHESIA: MAC, topical.   ESTIMATED BLOOD LOSS: Less than 1 drop.   COMPLICATIONS: There were no complications.   SPECIMENS: None.     DESCRIPTION OF PROCEDURE: In the preanesthesia area, the patient was given a drop of topical betadine and topical Tetracaine. The patient was taken back to the operating room suite.  Topical  Akten (lidocaine gel) was applied.  A surgical Time-Out confirmed the correct patient, procedure, operative eye, and IOL model and power.  The eye was then prepped and draped in the usual sterile fashion. A speculum was placed in the eye. A disposable supersharp blade was used to make a 1-mm side port entry through clear cornea along the limbus, and 1 mL of 1% preservative-free Xylocaine was irrigated into the anterior chamber. The anterior chamber was filled with viscoelastic material. The keratome was used to make a 2.4-mm phaco port incision through clear cornea temporally along the limbus. The cystotome and capsulorrhexis forceps were used to create a continuous curvilinear capsulorrhexis. Hydrodissection was performed with balanced salt solution on a cannula. The tip of the phacoemulsification handpiece was introduced into the eye, and the lens nucleus was emulsified using a bimanual nuclear splitting technique.  The phaco tip was removed and the tip of the I/A handpiece introduced into the eye. The remaining lens cortical material was aspirated from the eye. The I/A tip was removed, and the capsular bag was  inflated with viscoelastic material. The posterior chamber lens implant [id number] +18.5 diopters power was brought onto the field and inspected. It was without defect and was loaded into the lens injector. The tip of the injector was inserted into the eye, and the lens implant was placed in the capsular bag. The injector tip was removed and the tip of the I/A handpiece reintroduced into the eye. The remaining viscoelastic material was aspirated. The I/A tip was removed, and the anterior chamber was pressurized with balanced salt solution. The anterior wound margins were hydrated with balanced salt solution and 1mg  of Vigamox was instilled into the anterior chamber.  Weck-cel sponges were gently brushed against the incision and confirmed that there was no wound leakage.  The speculum was removed from the eye, and the eye was bandaged with a rigid shield. The patient was returned to same day surgery in excellent condition. Dr. Christell Constant was present for the key and critical portions of the case and immediately available at all times.      Arma Heading, MD  Resident   [place] Department of Ophthalmology     Brett Fairy, MD   Assistant Professor   [facility] Department of Ophthalmology     Implant Name Type Inv. Item Serial No. Manufacturer Lot No. LRB No. Used Action   LENS IOL +18.5 DIOP [employer]OME CL[employer]ON ASPH HDRPHB ACRL STRL LF - [id number]  LENS IOL +18.5 DIOP AUTONOME CLAREON ASPH HDRPHB ACRL STRL LF [id number] [employer] NA Right 1 Implanted     [facility]  DEPARTMENT OF  OPHTHALMOLOGY - DISCHARGE NOTE    PATIENT NAME:  [name] NAME:  [id number]  DATE OF SERVICE:  02/23/2023  DATE OF BIRTH:  11-Jan-1956    Patient meets discharge criteria.  Discharge to home  Keep shield in place.  RTC as scheduled or sooner prn any problem.     Arma Heading, MD 02/23/2023, 10:30

## 2023-02-23 NOTE — Progress Notes (Signed)
Procedure: Phacoemulsification of cataract with lens implant right eye(s).    Pre-op diagnosis: Cataract right eye(s).      The patient has been thoroughly counseled as to the indications, alternatives, and potential risks of the procedures including, but not limited to, failure of operation, need for further surgery, scar, complications from anesthesia, loss of vision, infection, bleeding, corneal abrasion.    The patient understands the procedure with its risks and benefits, and desires to proceed as outlined above.    Kathleen Argue, MD 02/23/2023, 09:59

## 2023-02-23 NOTE — Anesthesia Transfer of Care (Signed)
ANESTHESIA TRANSFER OF CARE   [name] is a 67 y.o. ,female, Weight: 101 kg (221 lb 12.5 oz)   had Procedure(s):  PHACO WITH INTRAOCULAR LENS  performed  02/23/23   Primary Service: Kathleen Argue, MD    Past Medical History:   Diagnosis Date   . Abdominal hernia 04/29/2020    hx of 3 repairs   . Anemia    . Aortic stenosis    . Arthritis    . Atrial fibrillation (CMS HCC) 04/29/2020    one episode  after AVR   . Back problem 04/29/2020    LBP   . Beta blocker prescribed for left ventricular systolic dysfunction    . Blood in stool     positive cologuard.  Has not yet scheduled colonoscopy   . Blood thinned due to long-term anticoagulant use 04/29/2020    coumadin   . Cancer (CMS HCC)     skin cancer L lip.  Needs MOHS to be scheduled   . Chronic pain 04/29/2020    back and right hip   . Colon polyp    . Dysrhythmias     1 episode of A-fib per patient   . Heart murmur 01/04/2019    AVR   . High blood pressure    . Motion sickness    . Obesity    . Rash 04/29/2020    scalp psoriasis and involves R ear   . Valvular disease     aortic valve stenosis, following with CT surgery, echo 02/2016 and cath 03/2016   . Wears glasses       Allergy History as of 02/23/23       BUTORPHANOL TARTRATE         Noted Status Severity Type Reaction    01/08/16 1311 Balinda Quails 01/08/16 Active   Mental Status Effect              IV CONTRAST         Noted Status Severity Type Reaction    01/08/16 1312 Balinda Quails 01/08/16 Active High  Anaphylaxis              TOMATO         Noted Status Severity Type Reaction    01/08/16 1312 Balinda Quails 01/08/16 Active Low  Hives/ Urticaria              OREGANO         Noted Status Severity Type Reaction    08/04/16 1129 Rozzell, Rayann Heman, RN 01/08/16 Active       Comments: Hives       01/08/16 1312 Balinda Quails 01/08/16 Active                 PENICILLIN         Noted Status Severity Type Reaction    09/17/22 1034 Spring, Jayce, LPN 09/81/19 Active Medium  Itching, Diarrhea, Hives/  Urticaria              LATEX         Noted Status Severity Type Reaction    01/19/23 1130 Gabriel Carina [facility] 01/19/23 Active       Comments: Added based on information entered during case entry, please review and add reactions, type, and severity as needed                   I completed my transfer of care / handoff to the receiving personnel during which we discussed:  Access,  Airway, All key/critical aspects of case discussed, Analgesia, Expectation of post procedure, Fluids/Product, Gave opportunity for questions and acknowledgement of understanding and PMHx      Post Location: PACU                        Additional Info:Report to bedside RN and care relinquished- pt is awake and talking                                   Last OR Temp: Temperature: 36 C (96.8 F)  ABG:  PH (ARTERIAL)   Date Value Ref Range Status   01/04/2019 7.33 (L) 7.35 - 7.45 Final     PCO2 (ARTERIAL)   Date Value Ref Range Status   01/04/2019 48.0 (H) 35.0 - 45.0 mm/Hg Final     PCO2 (PCO2P)   Date Value Ref Range Status   01/04/2019 53 (HH) 35 - 45 mmHg Final     PO2 (ARTERIAL)   Date Value Ref Range Status   01/04/2019 97.0 72.0 - 100.0 mm/Hg Final     PO2 (PO2P)   Date Value Ref Range Status   01/04/2019 383 (H) 72 - 100 mmHg Final     SODIUM   Date Value Ref Range Status   01/04/2019 134 (L) 137 - 145 mmol/L Final     POTASSIUM   Date Value Ref Range Status   02/04/2023 4.0 3.5 - 5.1 mmol/L Final   01/18/2019 4.4  Final     KETONES   Date Value Ref Range Status   12/29/2018 Negative Negative mg/dL Final     POTASSIUM, POC   Date Value Ref Range Status   01/04/2019 3.8 3.5 - 5.0 mmol/L Final     WHOLE BLOOD POTASSIUM   Date Value Ref Range Status   01/04/2019 3.5 3.5 - 4.6 mmol/L Final     CHLORIDE   Date Value Ref Range Status   01/04/2019 109 101 - 111 mmol/L Final     CALCIUM   Date Value Ref Range Status   02/04/2023 9.1 8.6 - 10.3 mg/dL Final     Comment:     Gadolinium-containing contrast can interfere with calcium measurement.      01/18/2019 9.1  Final     Calculated P Axis   Date Value Ref Range Status   02/02/2023 67 degrees Final     Calculated R Axis   Date Value Ref Range Status   02/02/2023 41 degrees Final     Calculated T Axis   Date Value Ref Range Status   02/02/2023 58 degrees Final     IONIZED CALCIUM   Date Value Ref Range Status   01/06/2019 1.14 1.10 - 1.35 mmol/L Final     IONIZED CALCIUM, POC   Date Value Ref Range Status   01/04/2019 1.05 (L) 1.30 - 1.46 mmol/L Final     LACTATE   Date Value Ref Range Status   01/04/2019 1.1 0.0 - 1.3 mmol/L Final     HEMOGLOBIN   Date Value Ref Range Status   01/04/2019 12.1 12.0 - 18.0 g/dL Final     OXYHEMOGLOBIN   Date Value Ref Range Status   01/04/2019 95.5 85.0 - 98.0 % Final     CARBOXYHEMOGLOBIN   Date Value Ref Range Status   01/04/2019 1.5 0.0 - 2.5 % Final     MET-HEMOGLOBIN   Date Value  Ref Range Status   01/04/2019 1.4 0.0 - 2.0 % Final     BASE EXCESS (BEP)   Date Value Ref Range Status   01/04/2019 3.0 -2.0 - 3.0 mEq/L Final     BASE DEFICIT   Date Value Ref Range Status   01/04/2019 1.1 0.0 - 3.0 mmol/L Final     BICARBONATE (ARTERIAL)   Date Value Ref Range Status   01/04/2019 24.0 18.0 - 26.0 mmol/L Final     HCO3 (HCO3P)   Date Value Ref Range Status   01/04/2019 29 (H) 22 - 26 mmol/L Final     Airway:* No LDAs found *  Blood pressure 128/75, pulse 63, temperature 36 C (96.8 F), resp. rate 18, height 1.664 m (5' 5.5"), weight 101 kg (221 lb 12.5 oz), SpO2 98%, not currently breastfeeding.

## 2023-02-23 NOTE — Discharge Summary (Signed)
Discharge Note  Patient meets discharge criteria  Discharge to home  Return to [facility] as scheduled or sooner if need be  Keep surgical dressing in place    Kathleen Argue, MD 02/23/2023, 10:30

## 2023-02-23 NOTE — H&P (Signed)
[facility]                                                     H&P UPDATE FORM    Patient:  [name]  Med Record # [id number]  Date of Birth:   11/04/1955  Age:  67 y.o.   Gender: female    Date of Admission: 02/23/2023    Outpatient Pre-Surgical H & P updated the day of the procedure.  1.I reviewed the H&P completed in 30 days of today's surgical procedure by Hollice Gong, APRN,FNP-BC at 02/02/23 . Assessment remains unchanged based on completion of today's re-assessment.        Change in medications: No      Last Menstrual Period: No/not applicable      Comments:     2.  Patient continues to be appropiate candidate for planned surgical procedure. YES    Arma Heading, MD 02/23/2023, 09:58      I saw and examined the patient.  I reviewed the resident's note.  I agree with the findings and plan of care as documented in the resident's note.  Any exceptions/additions are edited/noted.    Kathleen Argue, MD

## 2023-02-24 ENCOUNTER — Ambulatory Visit: Payer: Medicare Other | Attending: Ophthalmology | Admitting: Ophthalmology

## 2023-02-24 ENCOUNTER — Encounter (INDEPENDENT_AMBULATORY_CARE_PROVIDER_SITE_OTHER): Payer: Self-pay | Admitting: Ophthalmology

## 2023-02-24 ENCOUNTER — Other Ambulatory Visit: Payer: Self-pay

## 2023-02-24 ENCOUNTER — Ambulatory Visit (HOSPITAL_BASED_OUTPATIENT_CLINIC_OR_DEPARTMENT_OTHER): Payer: Self-pay | Admitting: Family

## 2023-02-24 DIAGNOSIS — Z961 Presence of intraocular lens: Secondary | ICD-10-CM | POA: Insufficient documentation

## 2023-02-24 NOTE — Progress Notes (Addendum)
[facility]  [address]  [place] [address]  Operated by [facility]         Patient Name: [name]  MRN#: [id number]  Birthdate: [birth year]    Date of Service: 02/24/2023    Chief Complaint    Post Op         Alysandra Losi is a 67 y.o. female who presents today for evaluation/consultation of:  HPI       Post Op     Additional comments: NSC (Nuclear sclerotic) and Cortical  Cataract of right eye(s) with functional complaints of reading, driving, performing daily activities   SX on 02/23/2023               Comments    Pt is here for 1 day post op   States that [place] is blurry OD , clear OS   Pt states she did well last night   Pt states she is having some watering /tearing OU   Denies colored discharge OU   Pt states she is having some itchiness   Denies diplopia   Denies new/abnormal FOL/floaters  Denies curtains/veils  Denies eye pain/irritation  Denies missing VA/distorted VA  Pt states she used Prednisolone TID OD yesterday   Ocuflox TID OD                 Last edited by Lilyan Gilford, COA on 02/24/2023  9:37 AM.        ROS    Positive for: Eyes (post op)  Negative for: Constitutional, Gastrointestinal, Neurological, Skin, Genitourinary, Musculoskeletal, HENT, Endocrine, Cardiovascular, Respiratory, Psychiatric, Allergic/Imm, Heme/Lymph  Last edited by Lilyan Gilford, COA on 02/24/2023  9:32 AM.         All other systems Negative    Lilyan Gilford, COA  02/24/2023, 09:37    Base Eye Exam       Visual Acuity (Snellen - Linear)         Right Left    Dist sc 20/50 20/40 +1    Dist ph sc 20/40 20/25              Tonometry (icare, 9:42 AM)         Right Left    Pressure 25 19              Pupils         Shape React APD    Right Round Minimal None    Left Round Minimal None              Visual Fields         Right Left     Full Full              Extraocular Movement         Right Left     Full Full              Neuro/Psych       Oriented x3: Yes    Mood/Affect: Normal                   Slit Lamp and Fundus Exam       External Exam         Right Left    External Normal Normal              Slit Lamp Exam         Right Left    Lids/Lashes Dermatochalasis - upper lid  Dermatochalasis - upper lid    Conjunctiva/Sclera White and quiet White and quiet    Cornea 2+ temporal edema Clear    Anterior Chamber trace cell Deep and quiet    Iris Round and reactive Round and reactive    Lens PCIOL PCIOL    Anterior Vitreous Normal PVD                    MD Addition to HPI:  Patient here for post op visit #1 right. Patient reports mild pain following cataract surgery      This patient has a satisfactory Post-op visit #1 following cataract extraction with PCIOL right eye. There is moderate corneal edema. There is mild elevation of the intraocular pressure..    Plan: Vigamox/Ocuflox 1 drop QID to operative eye. Pred Forte 1 Drop QID to operatve eye. Patient instructed in proper drop usage. Patient advised to wear shield over operative eye at bedtime for one week. Return to clinic in approximately one week. Patient instructed to call [facility] with any change in vision, pain, or redness between now and next visit. Patient is to avoid activities that might result in bumping the eye and patient is not to immerse head under water. Showering is permitted.     I reviewed and made necessary changes to the technician, resident or fellow note regarding CC/HPI/ROS/Past Family, Medical and Social Hx/Surg Hx.    Arma Heading, MD  02/24/2023, 09:53                I reviewed and made necessary changes to the technician, resident or fellow note regarding CC/HPI/ROS/Past Family, Medical and Social Hx/Surg Hx.  Kathleen Argue, MD 02/24/2023, 10:03      I saw and examined the patient.  I reviewed the resident's note.  I agree with the findings and plan of care as documented in the resident's note. I personally reviewed and interpreted all testing and/or imaging performed at this visit and agree with the resident or fellow's  interpretation. Any exceptions/additions are edited/noted.    Kathleen Argue, MD 02/24/2023, 10:03

## 2023-02-25 ENCOUNTER — Ambulatory Visit (HOSPITAL_COMMUNITY): Payer: Self-pay

## 2023-02-25 ENCOUNTER — Encounter (HOSPITAL_COMMUNITY): Payer: Self-pay

## 2023-02-25 LAB — PT/INR: INR: 2.1

## 2023-02-25 NOTE — Progress Notes (Signed)
Anticoagulation Encounter Note:    [name]/fellow    Surgical AVR plan   Warfarin start date: 01-06-2019   [facility] Staff: [name] (with fellow)  DX : On-X AVR   INR Range: 1.5 - 2.0  Tab strength: warfarin 2mg    Contact 1: [phone] cell   Contact 2:   Lab: [facility]   Comments: Tylenol regularly    [facility] anticoag SCN: [name] RN ext [phone]    INR: 2.1    Current Dose: 6mg  Mondays and 4mg  all other days     Clinical Outcomes       Positives:  INR Above Goal          Plan: 6mg  Mondays and 4mg  all other days, recheck in 2 weeks. Confirmed dosing and testing with patient via MyChart message.    Next INR Date: 5.23.24    Fabian Sharp, RN  Anticoag Specialty Care Nurse  Ext [phone]      For additional information, see dosing calendar below.      Warfarin Therapy Instructions   April 2024 Details    Sun Loyal Buba Thu Fri Sat      1               2               3               4               5               6                 7               8               9               10               11               12               13                 14               15               16               17               18               19               20                 21               22               23               24               25    2.3   4 mg   See details      26      4 mg         27  4 mg           28      4 mg         29      6 mg         30      4 mg              Date Details   04/25 Last INR check     INR: 2.3              Warfarin Therapy Instructions   May 2024 Details    Sun Loyal B[place] Thu Fri Sat        1      4 mg         2      4 mg         3      4 mg         4      4 mg           5      4 mg         6      6 mg         7      4 mg         8      4 mg         9   2.1   4 mg   See details      10      4 mg         11      4 mg           12      4 mg         13      6 mg         14      4 mg         15      4 mg         16      4 mg         17      4 mg         18      4 mg           19      4 mg          20      6 mg         21      4 mg         22      4 mg         23      6 mg         24               25                 26               27               28               29                30  31                 Date Details   05/09 This INR check     INR: 2.1       Date of next INR:  03/11/2023

## 2023-03-03 ENCOUNTER — Ambulatory Visit: Payer: Medicare Other | Attending: Family | Admitting: Family

## 2023-03-03 ENCOUNTER — Encounter (HOSPITAL_BASED_OUTPATIENT_CLINIC_OR_DEPARTMENT_OTHER): Payer: Self-pay | Admitting: Family

## 2023-03-03 ENCOUNTER — Other Ambulatory Visit: Payer: Self-pay

## 2023-03-03 VITALS — BP 134/72 | HR 76 | Temp 97.3°F | Ht 65.5 in | Wt 224.2 lb

## 2023-03-03 DIAGNOSIS — Z1231 Encounter for screening mammogram for malignant neoplasm of breast: Secondary | ICD-10-CM

## 2023-03-03 DIAGNOSIS — Z0000 Encounter for general adult medical examination without abnormal findings: Secondary | ICD-10-CM

## 2023-03-03 DIAGNOSIS — L409 Psoriasis, unspecified: Secondary | ICD-10-CM

## 2023-03-03 DIAGNOSIS — I10 Essential (primary) hypertension: Secondary | ICD-10-CM

## 2023-03-03 DIAGNOSIS — Z952 Presence of prosthetic heart valve: Secondary | ICD-10-CM

## 2023-03-03 DIAGNOSIS — M1611 Unilateral primary osteoarthritis, right hip: Secondary | ICD-10-CM

## 2023-03-03 MED ORDER — CLOBETASOL 0.05 % SCALP SOLUTION
Freq: Two times a day (BID) | 1 refills | Status: AC
Start: 2023-03-03 — End: ?

## 2023-03-03 MED ORDER — HYDROCHLOROTHIAZIDE 25 MG TABLET
25.0000 mg | ORAL_TABLET | Freq: Every day | ORAL | 4 refills | Status: DC
Start: 2023-03-03 — End: 2023-12-22

## 2023-03-03 NOTE — Progress Notes (Signed)
[facility]  [address]  [place] [address]  Operated by [facility]  Medicare Annual Wellness Visit    Name: [name] MRN:  [id number]   Date: 03/03/2023 Age: 67 y.o.       SUBJECTIVE:   [name] is a 67 y.o. female for presenting for Medicare Wellness exam.   I have reviewed and reconciled the medication list with the patient today.    Comprehensive Health Assessment:      03/03/2023     2:00 PM 02/12/2022    12:00 PM 01/13/2021    11:00 AM   Comprehensive Health Assessment-Adult   Do you wish to complete this form? Yes Yes    During the past 4 weeks, how would you rate your health in general? Very Good Very Good Good   During the past 4 weeks, how much difficulty have you had doing your usual activities inside and outside your home because of medical or emotional problems? Some difficulty A little bit of difficulty Some difficulty   During the past 4 weeks, was someone available to help you if you needed and wanted help? Yes, quite a bit Yes, as much as I wanted Yes, some   In the past year, how many times have you gone to the emergency department or been admitted to a hospital for a health problem? 1 time 1 time None   Are you generally satisfied with your sleep? Yes Yes No   Do you have enough money to buy things you need in everyday life, such as food, clothing, medicines, and housing? Yes, always Yes, always Yes, always   Can you get to places beyond walking distance without help?  (For example, can you drive your own car or travel alone on buses)? Yes Yes Yes   Do you fasten your seatbelt when you are in a car? Yes, usually Yes, usually Yes, usually   Do you exercise 20 minutes 3 or more days per week (such as walking, dancing, biking, mowing grass, swimming)? Yes, most of the time Yes, most of the time Yes, some of the time   How often do you eat food that is healthy (fruits, vegetables, lean meats) instead of unhealthy (sweets, fast food, junk food,  fatty foods)? Most of the time Most of the time Most of the time   Have your parents, brothers or sisters had any of the following problems before the age of 19? (check all that apply) Heart problems, or hardening of the arteries;Cancer;High cholesterol;Alcohol or drug addiction (or abuse) Heart problems, or hardening of the arteries;Cancer;High cholesterol;Alcohol or drug addiction (or abuse)    How often do you have trouble taking medicines the eay you are told to take them? I always take them as prescribed I always take them as prescribed I always take them as prescribed   Do you need any help communicating with your doctors and nurses because of vision or hearing problems? No No No   During the past 12 months, have you experienced confusion or memory loss that is happening more often or is getting worse? No No No   Do you have one person you think of as your personal doctor (primary care provider or family doctor)? Yes Yes    If you are seeing a Primary Care Provider (PCP) or family doctor. please list their name Trudee Kuster Neysa Arts    Are you now also seeing any specialist physician(s) (such as eye doctor, foot doctor,  skin doctor)? Yes Yes Yes   If you are seeing a specialist for anything such as foot, eye, skin, etc.  please list their name(s) Kathleen Argue (Eye) dr for hip, derm, cardio    How confident are you that you can control or manage most of your health problems? Very confident Very confident Very confident         I have reviewed and updated as appropriate the past medical, family and social history. 03/03/2023 as summarized below:  Past Medical History:   Diagnosis Date    Abdominal hernia 04/29/2020    hx of 3 repairs    Anemia     Aortic stenosis     Arthritis     Atrial fibrillation (CMS HCC) 04/29/2020    one episode  after AVR    Back problem 04/29/2020    LBP    Beta blocker prescribed for left ventricular systolic dysfunction     Blood in stool     positive cologuard.  Has not yet  scheduled colonoscopy    Blood thinned due to long-term anticoagulant use 04/29/2020    coumadin    Cancer (CMS HCC)     skin cancer L lip.  Needs MOHS to be scheduled    Chronic pain 04/29/2020    back and right hip    Colon polyp     Dysrhythmias     1 episode of A-fib per patient    Heart murmur 01/04/2019    AVR    High blood pressure     Motion sickness     Obesity     Rash 04/29/2020    scalp psoriasis and involves R ear    Valvular disease     aortic valve stenosis, following with CT surgery, echo 02/2016 and cath 03/2016    Wears glasses      Past Surgical History:   Procedure Laterality Date    Cataract extraction Left 02/09/2023    Colonoscopy      Hx aortic valve replacement      Hx cataract removal Right 02/23/2023    Hx cesarean section  1984    Hx heart catheterization      Hx hernia repair  2004, 2006, 2008    Hx hip replacement Bilateral     Hx hysterectomy  2003    Hx oophorectomy       Current Outpatient Medications   Medication Sig    ascorbic acid, vitamin C, (VITAMIN C) 500 mg Oral Tablet Take 0.5 Tablets (250 mg total) by mouth Once a day    aspirin 81 mg Oral Tablet, Chewable Take 1 Tablet (81 mg total) by mouth Once a day    Bifidobacterium infantis (ALIGN ORAL) Take by mouth    Cholecalciferol, Vitamin D3, 50 mcg (2,000 unit) Oral Capsule Take 2,000 Units by mouth Once a day     clindamycin (CLEOCIN) 300 mg Oral Capsule 2 capsules (600 mg) by mouth one half hour to an hour before dental treatment (Patient not taking: Reported on 02/23/2023)    clobetasoL (CORMAX) 0.05 % Solution Apply topically Twice daily    clobetasoL (TEMOVATE) 0.05 % Cream Apply topically Twice daily    Fluocinolone-Shower Cap 0.01 % Oil Apply to scalp 1-2 times weekly at night. (Patient not taking: Reported on 02/23/2023)    hydroCHLOROthiazide (HYDRODIURIL) 25 mg Oral Tablet Take 1 Tablet (25 mg total) by mouth Once a day    loratadine (CLARITIN) 10 mg Oral Tablet Take 1 Tablet (10  mg total) by mouth Once a day    metoprolol  succinate (TOPROL-XL) 50 mg Oral Tablet Sustained Release 24 hr Take 1 Tablet (50 mg total) by mouth Every night    mupirocin (BACTROBAN) 2 % Ointment Apply topically Three times a day to wound of left lower leg (Patient not taking: Reported on 02/23/2023)    ofloxacin (OCUFLOX) 0.3 % Ophthalmic Drops Ophthalmic Solution Administer 1 Drop into the left eye Four times a day    ofloxacin (OCUFLOX) 0.3 % Ophthalmic Drops Ophthalmic Solution Administer 1 Drop into the right eye Four times a day    prednisoLONE acetate (PRED FORTE) 1 % Ophthalmic Drops, Suspension Administer 1 Drop into the left eye Four times a day    prednisoLONE acetate (PRED FORTE) 1 % Ophthalmic Drops, Suspension For use in surgical suite and 4 times daily into right eye as directed    prednisoLONE acetate (PRED FORTE) 1 % Ophthalmic Drops, Suspension Administer 1 Drop into the right eye Four times a day    triamcinolone acetonide (ARISTOCORT A) 0.1 % Cream Apply topically Twice daily (Patient not taking: Reported on 02/23/2023)    triamcinolone acetonide 0.1 % Ointment Apply topically Twice per day as needed to rash on body. Use 2-4 weeks, then break. (Patient not taking: Reported on 02/23/2023)    warfarin (COUMADIN) 2 mg Oral Tablet Take 3 Tablets (6 mg total) by mouth Every evening Or as directed by Anticoag clinic     Family Medical History:       Problem Relation (Age of Onset)    Atrial fibrillation Sister    Heart Disease Mother    Hypertension (High Blood Pressure) Father    Lung Cancer Father, Brother    Macular Degen Maternal Aunt    Mitral valve prolapse Sister    Retinal Detachment Sister    Sudden Death no cause Mother            Social History     Socioeconomic History    Marital status: Single   Occupational History    Occupation: photography    Tobacco Use    Smoking status: Former     Current packs/day: 0.00     Average packs/day: 1 pack/day for 42.0 years (42.0 ttl pk-yrs)     Types: Cigarettes     Start date: 9     Quit date: 2014      Years since quitting: 10.3    Smokeless tobacco: Never   Vaping Use    Vaping status: Never Used   Substance and Sexual Activity    Alcohol use: Yes     Comment: rarely    Drug use: Never    Sexual activity: Not Currently     Social Determinants of Health     Health Literacy: Low Risk  (06/18/2022)    Health Literacy     SDOH Health Literacy: Never         List of Current Health Care Providers   Care Team       PCP       Name Type Specialty Phone Number    Hollice Gong, [facility] Nurse Practitioner Family Practice-BA [phone]              Care Team       Name Type Specialty Phone Number    Earnie Larsson, MD Physician CARDIOVASCULAR DISEASE [phone]    Anticoag Team, Hvi Set Not available Not available Not available    Nicholaus Bloom, [facility] Nurse  Practitioner NURSE PRACTITIONER [phone]    Gerald Stabs, MD Physician ADULT RECONSTRUCTIVE ORTHOPAEDIC SURGERY [phone]    [name], RN Registered Nurse Not available Not available    Clenton Pare, [facility] Nurse Practitioner NURSE PRACTITIONER [phone]    Kathleen Argue, MD Physician OPHTHALMOLOGY [phone]                      Health Maintenance   Topic Date Due    Shingles Vaccine (1 of 2) Never done    Covid-19 Vaccine (5 - 2023-24 season) 06/19/2022    CT Lung Cancer Screening  03/10/2023    Influenza Vaccine (Season Ended) 06/20/2023    Depression Screening  03/02/2024    Medicare Annual Wellness Visit  03/02/2024    Mammography  11/27/2024    Adult Tdap-Td (2 - Td or Tdap) 11/08/2028    Osteoporosis screening  02/27/2030    Colonoscopy  05/14/2030    Hepatitis C screening  Completed    Pneumococcal Vaccination, Age 10+  Completed    Meningococcal Vaccine  Aged Out     Medicare Wellness Assessment   Medicare initial or wellness physical in the last year?: Yes  Advance Directives   Does patient have a living will or MPOA: Yes   Has patient provided [employer] with a copy?: No   Patient advised  to bring in copy.: Yes          Activities of Daily Living   Do you need help with dressing, bathing, or walking?: No   Do you need help with shopping, housekeeping, medications, or finances?: No   Do you have rugs in hallways, broken steps, or poor lighting?: Yes   Do you have grab bars in your bathroom, non-slip strips in your tub, and hand rails on your stairs?: Yes   Cognitive Function Screen (1=Yes, 0=No)   What is you age?: Correct   What is the time to the nearest hour?: Correct   What is the year?: Correct   What is the name of this clinic?: Correct   Can the patient recognize two persons (the doctor, the nurse, home help, etc.)?: Correct   What is the date of your birth? (day and month sufficient) : Correct   In what year did World War II end?: Correct   Who is the current president of the [place]?: Correct   Count from 20 down to 1?: Correct   What address did I give you earlier?: Correct   Total Score: 10   Interpretation of Total Score: Greater than 6 Normal   Fall Risk Screen   Do you feel unsteady when standing or walking?: No  Do you worry about falling?: Yes  Have you fallen in the past year?: No  Timed up and go test (in seconds): 2   Depression Screen     Little interest or pleasure in doing things.: Not at all  Feeling down, depressed, or hopeless: Not at all  PHQ 2 Total: 0     Pain Score   Pain Score:   3    Substance Use-Abuse Screening     Tobacco Use     In Past 12 MONTHS, how often have you used any tobacco product (for example, cigarettes, e-cigarettes, cigars, pipes, or smokeless tobacco)?: Never     Alcohol use     In the PAST 12 MONTHS, how often have you had 5 (men)/4 (women) or more drinks containing alcohol in one day?: Never  Prescription Drug Use     In the PAST 12 months, how often have you used any prescription medications just for the feeling, more than prescribed, or that were not prescribed for you? Prescriptions may include: opioids, benzodiazepines, medications for  ADHD: Never           Illicit Drug Use   In the PAST 12 MONTHS, how often have you used any drugs, including marijuana, cocaine or crack, heroin, methamphetamine, hallucinogens, ecstasy/MDMA?: Never        OBJECTIVE:   BP 134/72   Pulse 76   Temp 36.3 C (97.3 F)   Ht 1.664 m (5' 5.5")   Wt 102 kg (224 lb 3.3 oz)   SpO2 96%   BMI 36.74 kg/m     Other appropriate exam:  General: appears in good health and no distress  HENT: ENT without erythema or injection, mucous membranes moist.  Neck: No JVD or thyromegaly or lymphadenopathy  Lungs: clear to auscultation bilaterally.   Cardiovascular:  Heart regular rate and rhythm  Abdomen: soft, non-tender, bowel sounds normal, and non-distended  Psychiatric: normal affect, behavior, memory, thought content, judgement, and speech.    Health Maintenance Due   Topic Date Due    Shingles Vaccine (1 of 2) Never done    Covid-19 Vaccine (5 - 2023-24 season) 06/19/2022    CT Lung Cancer Screening  03/10/2023        ICD-10-CM    1. Medicare annual wellness visit, subsequent  Z00.00       2. Encounter for screening mammogram for malignant neoplasm of breast  Z12.31 MAMMO BILATERAL SCREENING-ADDL VIEWS/BREAST US AS REQ BY RAD      3. Essential hypertension  I10 hydroCHLOROthiazide (HYDRODIURIL) 25 mg Oral Tablet         4. Psoriasis  L40.9 clobetasoL (CORMAX) 0.05 % Solution      5. S/P AVR (aortic valve replacement)  Z95.2 Has established care with cardiology. On warfarin      6 Primary osteoarthritis of right hip  M16.11 Continue care with ortho           Identified Risk Factors/ Recommended Actions     Fall Risk Follow up plan of care: Vitamin D supplementation advised  Discussed optimizing home safety  The PHQ 2 Total: 0 depression screen is interpreted as negative.    Urinary Incontinence Plan of Care: Behavioral interventions with bladder training, pelvic floor muscle training, and/or prompted voiding    Orders Placed This Encounter    MAMMO BILATERAL SCREENING-ADDL  VIEWS/BREAST US AS REQ BY RAD    hydroCHLOROthiazide (HYDRODIURIL) 25 mg Oral Tablet    clobetasoL (CORMAX) 0.05 % Solution          The patient has been educated about risk factors and recommended preventive care. Written Prevention Plan completed/ updated and given to patient (see After Visit Summary).    Return in about 1 year (around 03/02/2024) for wellness visit.    Hollice Gong, APRN,FNP-BC

## 2023-03-03 NOTE — Patient Instructions (Signed)
Medicare Preventive Services  Medicare coverage information Recommendation for YOU   Heart Disease and Diabetes   Lipid profile Every 5 years or more often if at risk for cardiovascular disease     Lab Results   Component Value Date    CHOLESTEROL 175 02/04/2023    HDLCHOL 70 02/04/2023    LDLCHOL 92 02/04/2023    TRIG 68 02/04/2023           Diabetes Screening    Yearly for those at risk for diabetes, 2 tests per year for those with prediabetes Last Glucose:      Diabetes Self Management Training or Medical Nutrition Therapy  For those with diabetes, up to 10 hrs initial training within a year, subsequent years up to 2 hrs of follow up training Optional for those with diabetes     Medical Nutrition Therapy  Three hours of one-on-one counseling in first year, two hours in subsequent years Optional for those with diabetes, kidney disease   Intensive Behavioral Therapy for Obesity  Face-to-face counseling, first month every week, month 2-6 every other week, month 7-12 every month if continued progress is documented Optional for those with Body Mass Index 30 or higher  Your Body mass index is 36.74 kg/m.   Tobacco Cessation (Quitting) Counseling   Covers up to 8 smoking and tobacco-use cessation counseling sessions in a 14-month period.    Optional for those that use tobacco   Cancer Screening Last Completion Date   Colorectal screening   For anyone age 46 to 67 or any age if high risk:  Screening Colonoscopy every 10 yrs if low risk,  more frequent if higher risk  OR  Cologuard Stool DNA test once every 3 years OR  Fecal Occult Blood Testing yearly OR  Flexible  Sigmoidoscopy  every 5 yr OR  CT Colonography every 5 yrs    --05/14/2020  See below for due date if applicable.   Screening Pap Test   Recommended every 3 years for all women age 67 to 21, or every five years if combined with HPV test (routine screening not needed after total hysterectomy).  Medicare covers every 2 years or yearly if high risk.  Screening  Pelvic Exam   Medicare covers every 2 years, yearly if high risk or childbearing age with abnormal Pap in last 3 yrs.   ?  See below for due date if applicable.   Screening Mammogram   Recommended every 2 years for women age 53 to 32, or more frequent if you have a higher risk. Selectively recommended for women between 40-49 based on shared decisions about risk. Covered by Medicare up to every year for women age 48 or older --11/27/2022  See below for due date if applicable.         Lung Cancer Screening  Annual low dose computed tomography (LDCT scan) is recommended for those age 30-80 who smoked 20 pack-years and are current smokers or quit smoking within past 15 years, after counseling by your doctor or nurse clinician about the possible benefits or harms.   --03/10/2022  See below for due date if applicable.   Vaccinations   Respiratory syncytial virus (RSV)  Age 62 years or older: Based on shared clinical decision-making with your provider.  Pneumococcal Vaccine  Recommended routinely age 74+ with one or two separate vaccines based on your risk. Recommended before age 41 if medical conditions with increased risk  Seasonal Influenza Vaccine  Once every flu season  Hepatitis B Vaccine  3 doses if risk (including anyone with diabetes or liver disease)  Shingles Vaccine  Two doses at age 74 or older  Diphtheria Tetanus Pertussis Vaccine  ONCE as adult, booster every 10 years     Immunization History   Administered Date(s) Administered   . Covid-19 Vaccine,Pfizer Bivalent,51mcg/0.3ml,12 yrs+ 07/15/2021   . Covid-19 Vaccine,Pfizer-BioNTech,Purple Top,59yrs+ 12/13/2019, 01/03/2020, 08/26/2020   . Influenza Vaccine, 6 month-adult 07/11/2019, 07/15/2021   . Influenza Vaccine, 65+ 08/26/2020   . Pneumovax 01/13/2021   . Tetanus Toxoid/Diphtheria Toxoid/Acellular Pertussis Vaccine, Adsorbed 11/08/2018   . VAXNEUVANCE 02/12/2022     Shingles vaccine and Diphtheria Tetanus Pertussis vaccines are available at pharmacies or  local health department without a prescription.   Other Preventative Screening  Last Completion Date   Bone Densitometry   Screening: All females ages 63 and older every 10 years if initial screening normal. Postmenopausal women ages 28-64 need screening with one or more risk factor: previous fracture, parental hip fracture, current smoker, low body weight, excessive alcohol use, Rheumatoid Arthritis   For women with diagnosed Osteoporosis, follow up is recommended every 2 years or a frequency recommended by your provider.     --02/28/2020  See below for due date if applicable.     Glaucoma Screening   Yearly if in high risk group such as diabetes, family history, African American age 23+ or Hispanic American age 29+   See your eye care provider for screening.   Hepatitis C Screening   Recommended  for those born between ages 18-79 years.   --01/20/2016  See below for due date if applicable.     HIV Testing  Recommended routinely at least ONCE, covered every year for age 2 to 69 regardless of risk, and every year for age over 12 who ask for the test or higher risk. Yearly or up to 3 times in pregnancy       ?  See below for due date if applicable.   Abdominal Aortic Aneurysm Screening Ultrasound   Once with a family history of abdominal aortic aneurysms OR a female between65-75 and have smoked at least 100 cigarettes in your lifetime.       ?  See below for due date if applicable.       Your Personalized Schedule for Preventive Tests   Health Maintenance: Pending and Last Completed       Date Due Completion Date    Shingles Vaccine (1 of 2) Never done ---    Covid-19 Vaccine (5 - 2023-24 season) 06/19/2022 07/15/2021    CT Lung Cancer Screening 03/10/2023 03/10/2022    Influenza Vaccine (Season Ended) 06/20/2023 07/15/2021    Depression Screening 03/02/2024 03/03/2023    Medicare Annual Wellness Visit 03/02/2024 03/03/2023    Mammography 11/27/2024 11/27/2022    Adult Tdap-Td (2 - Td or Tdap) 11/08/2028 11/08/2018     Osteoporosis screening 02/27/2030 02/28/2020    Colonoscopy 05/14/2030 05/14/2020                For Information on Advanced Directives for Health Care:  [place]:  [email or web address]  [place], [place], [place], [place] General Information: [email or web address]

## 2023-03-08 ENCOUNTER — Other Ambulatory Visit: Payer: Self-pay

## 2023-03-08 ENCOUNTER — Ambulatory Visit: Payer: Medicare Other | Attending: Ophthalmology | Admitting: Ophthalmology

## 2023-03-08 DIAGNOSIS — H43812 Vitreous degeneration, left eye: Secondary | ICD-10-CM | POA: Insufficient documentation

## 2023-03-08 DIAGNOSIS — Z961 Presence of intraocular lens: Secondary | ICD-10-CM | POA: Insufficient documentation

## 2023-03-08 NOTE — Progress Notes (Addendum)
[facility]  [address]  [place] [address]  Operated by [facility]         Patient Name: [name]  MRN#: [id number]  Birthdate: 1956-04-25    Date of Service: 03/08/2023    Chief Complaint    Post Op         [name] is a 67 y.o. female who presents today for evaluation/consultation of:  HPI    Pt here for POV     Pt states that vision has been stable   States that floaters in vision has been worse since surgery, but most noticeable out in periphery.   Feels like there is a lash or something in the way     Last edited by Aletta Edouard, COA on 03/08/2023  9:52 AM.        ROS    Positive for: Eyes  Last edited by Aletta Edouard, COA on 03/08/2023  9:52 AM.         All other systems Negative    Aletta Edouard, COA  03/08/2023, 09:52  Base Eye Exam       Visual Acuity (Snellen - Linear)         Right Left    Dist sc 20/20 -2 20/30 +1              Tonometry ([facility], 9:57 AM)         Right Left    Pressure 13 12              Pupils         Pupils APD    Right PERRL None    Left PERRL None              Visual Fields         Right Left     Full Full              Extraocular Movement         Right Left     Full Full              Neuro/Psych       Oriented x3: Yes    Mood/Affect: Normal                    MD Addition to HPI: Patient here for post op visit #2 right. Patient reports that they have been compliant with post op drops since last visit         This patient has satisfactory post-op visit #2 following cataract extraction with lens implant of right eye.    Plan: Discontinue Vigamox/Ocuflox eye drops. Taper Pred Forte as follows: 1 drop to operative eye TID x 1 week, then BID x week, then Q Day x week, then stop. Discontinue eye shield at bedtime. Return to clinic in 3-4 weeks, do refraction then.       I reviewed and made necessary changes to the technician, resident or fellow note regarding CC/HPI/ROS/Past Family, Medical and Social Hx/Surg Hx.    Kathleen Argue, MD  03/08/2023, 10:50

## 2023-03-11 ENCOUNTER — Encounter (HOSPITAL_COMMUNITY): Payer: Self-pay

## 2023-03-11 ENCOUNTER — Ambulatory Visit (HOSPITAL_COMMUNITY): Payer: Self-pay

## 2023-03-11 LAB — PT/INR: INR: 1.8

## 2023-03-11 NOTE — Progress Notes (Signed)
Anticoagulation Encounter Note:    /fellow    Surgical AVR plan   Warfarin start date: 01-06-2019   [facility] Staff: [name] (with fellow)  DX : On-X AVR   INR Range: 1.5 - 2.0  Tab strength: warfarin 2mg    Contact 1: [phone] cell   Contact 2:   Lab: POC/[place] - [facility]   Comments: Tylenol regularly    [facility]VI anticoag SCN: [name] RN ext [phone]    INR: 1.8  Current Dose: 6 mg Mondays, 4 mg all other days  Clinical Outcomes       Positives:  INR In Range            Plan: 6 mg Mondays, 4 mg all other days; recheck in 2 weeks. Confirmed dosing and testing with patient via MyChart message.    Next INR Date: 6.6.24    [name], RN SCN  Anticoag clinic  Ext. [phone]         For additional information, see dosing calendar below.      Warfarin Therapy Instructions   May 2024 Details    Sun Loyal Buba Thu Fri Sat        1               2               3               4                 5               6               7               8               9    2.1   4 mg   See details      10      4 mg         11      4 mg           12      4 mg         13      6 mg         14      4 mg         15      4 mg         16      4 mg         17      4 mg         18      4 mg           19      4 mg         20      6 mg         21      4 mg         22      4 mg         23   1.8   4 mg   See details      24      4 mg         25      4 mg  26      4 mg         27      6 mg         28      4 mg         29      4 mg         30      4 mg         31      4 mg           Date Details   05/09 Last INR check     INR: 2.1      05/23 This INR check     INR: 1.8                Warfarin Therapy Instructions   June 2024 Details    Sun Mon Tue Wed Thu Fri Sat           1      4 mg           2      4 mg         3      6 mg         4      4 mg         5      4 mg         6      6 mg         7               8                 9               10               11               12               13               14               15                 16                17               18               19               20               21               22                 23               24               25               26               27                28  29                 30                      Date Details   No additional details    Date of next INR:  03/25/2023

## 2023-03-16 ENCOUNTER — Other Ambulatory Visit (HOSPITAL_BASED_OUTPATIENT_CLINIC_OR_DEPARTMENT_OTHER): Payer: Self-pay | Admitting: Family

## 2023-03-16 ENCOUNTER — Inpatient Hospital Stay (HOSPITAL_BASED_OUTPATIENT_CLINIC_OR_DEPARTMENT_OTHER)
Admission: RE | Admit: 2023-03-16 | Discharge: 2023-03-16 | Disposition: A | Discharge status: Home / Self Care | Payer: Medicare Other | Source: Ambulatory Visit

## 2023-03-16 ENCOUNTER — Other Ambulatory Visit: Payer: Self-pay

## 2023-03-16 ENCOUNTER — Inpatient Hospital Stay (HOSPITAL_BASED_OUTPATIENT_CLINIC_OR_DEPARTMENT_OTHER)
Admission: RE | Admit: 2023-03-16 | Discharge: 2023-03-16 | Disposition: A | Discharge status: Home / Self Care | Payer: Medicare Other | Source: Ambulatory Visit | Admitting: Radiology

## 2023-03-16 ENCOUNTER — Encounter (HOSPITAL_BASED_OUTPATIENT_CLINIC_OR_DEPARTMENT_OTHER): Payer: Self-pay

## 2023-03-16 ENCOUNTER — Ambulatory Visit: Payer: Medicare Other | Attending: Orthopaedic Surgery | Admitting: Orthopaedic Surgery

## 2023-03-16 VITALS — Temp 97.9°F | Ht 64.8 in | Wt 222.4 lb

## 2023-03-16 DIAGNOSIS — Z122 Encounter for screening for malignant neoplasm of respiratory organs: Secondary | ICD-10-CM | POA: Insufficient documentation

## 2023-03-16 DIAGNOSIS — Z96641 Presence of right artificial hip joint: Secondary | ICD-10-CM | POA: Insufficient documentation

## 2023-03-16 DIAGNOSIS — Z87891 Personal history of nicotine dependence: Secondary | ICD-10-CM

## 2023-03-16 DIAGNOSIS — J439 Emphysema, unspecified: Secondary | ICD-10-CM

## 2023-03-16 DIAGNOSIS — Z96643 Presence of artificial hip joint, bilateral: Secondary | ICD-10-CM

## 2023-03-16 NOTE — Result Encounter Note (Signed)
Emphysema. No suspicious pulmonary nodule or mass. There are a few tiny scattered pulmonary nodules measuring less than 3 mm    There is a calcified granuloma on the right.    There is postoperative change from median sternotomy and aortic valve replacement. There is mitral annular calcification. There is mild coronary artery calcification.    Lung RADS Assessment Category: Benign appearance or behavior - 2    Will repeat lung screening in 1 year.  Encourage smoking cessation and continued adherence to annual lung cancer screenings to reduce risk of developing lung cancer.  Recommend following up with PCP and/or specialist for all other incidental findings as an additional workup or referral may be considered.     Nurse navigator will enter the protocol LDCT order for any future annual or follow up scans.     PCP and cardiology notifiedand recommend to contact patient should any further work up be advised. Patient notified via mychart.  Encourage patient to call for additional questions/concerns.    Thank you,     Vito [name], MSN, APRN, FNP-C, NCTTP  [facility]  Lung Cancer Screening & Tobacco Cessation  [address] [place], [place]  [phone]

## 2023-03-16 NOTE — Progress Notes (Signed)
[facility]  [address]  [place] [address]      SUBJECTIVE  [name] is a 67 y.o. female who returns to clinic today post operatively from a right total hip arthroplasty which was performed on 03/13/21 and L THA done in 2017.  The patient is doing well post operatively. The patient reports no pain in her right hip.  The patient is staying active and has no complaints.     REVIEW OF SYSTEMS  General: negative for fevers or night sweats  Cardiovascular: negative for chest pain  Respiratory: negative for shortness of breath  Gastrointestinal: negative for digestive problems  Genitourinary: negative for urinary problems  Musculoskeletal: neagtive for hip pain, negative knee pain     All other systems reviewed and negative    PHYSICAL EXAMINATION   Temp 36.6 C (97.9 F) (Thermal Scan)   Ht 1.646 m (5' 4.8")   Wt 101 kg (222 lb 7.1 oz)   BMI 37.24 kg/m       GEN:   NAD  EYES: Sclera white.  PULM:   Normal respiratory effort.    MS:  Right hip has smooth and painless rotation, stable gait.   NEURO:   Alert and oriented to person, place and time.    PSYCHOSOCIAL:   Pleasant.  Normal affect.    WOUND/INCISION:   No open wounds present.     RADIOGRAPHS  Radiographs were performed today and reviewed at this visit.    X-Ray of right hip was interpreted in clinic to show well positioned and aligned implants. No evidence of loosening, stem subsidence, fracture or dislocation.     ASSESSMENT     ICD-10-CM    1. Status post total replacement of right hip  Z96.641 Right [facility] Hip [facility] Series x-ray          PLAN  [name] is approximately 2 years s/p right total hip arthroplasty. she is doing well post operatively and does not have any complaints at the present time. The patient is satisfied with the operation.    The patient need antibiotic prophylaxis before future procedures. she has no restrictions other than to avoid high impact activities.      The patient was told that she will have a follow up visit scheduled in the Joint Clinic in 3 years at which time bilateral hip x-rays will be taken. Orders were placed in the system. The patient was instructed to call with any new or concerning symptoms. All of the patient's questions were answered and she agreed to this plan.    A shared visit was performed with the co-signing physician.  Candyce Churn Seifried, PA-C 03/16/2023, 10:39     I saw and examined the patient as part of a shared service with an APP.  I reviewed the midlevel's note.  I agree with the findings as documented in the midlevel's note.  Any exceptions/additions are edited/noted.  My substantive findings are:  great function    Radiographs:  Images reviewed by me on the day of the encounter revealed stable implants    My findings are consistent with:  Assessment/Plan   1. Status post total replacement of right hip        Plan: fu 3 years    Gerald Stabs, MD  03/16/2023, 15:02    Cc:   Hollice Gong, APRN,FNP-BC  [address]  [place] [address]

## 2023-03-17 ENCOUNTER — Ambulatory Visit (INDEPENDENT_AMBULATORY_CARE_PROVIDER_SITE_OTHER): Payer: Self-pay | Admitting: Ophthalmology

## 2023-03-25 ENCOUNTER — Encounter (HOSPITAL_COMMUNITY): Payer: Self-pay

## 2023-03-25 ENCOUNTER — Ambulatory Visit (HOSPITAL_COMMUNITY): Payer: Self-pay

## 2023-03-25 LAB — PT/INR: INR: 1.9

## 2023-03-25 NOTE — Progress Notes (Signed)
Anticoagulation Encounter Note:    [name]    Surgical AVR plan   Warfarin start date: 01-06-2019   [facility] Staff: [name] (with fellow)  DX : On-X AVR   INR Range: 1.5 - 2.0  Tab strength: warfarin 2mg    Contact 1: [phone] cell   Contact 2:   Lab: POC/[place] - [facility]   Comments: Tylenol regularly    HVI anticoag SCN: [name] RN ext [phone]    INR: 1.9  Current Dose: 6 mg Mondays, 4 mg all other days  Clinical Outcomes       Positives:  INR In Range            Plan: 6 mg Mondays, 4 mg all other days; recheck in 2 weeks. Confirmed dosing and testing with patient via MyChart message.    Next INR Date: 6.20.24    [name], RN SCN  Anticoag clinic  Ext. [phone]         For additional information, see dosing calendar below.      Warfarin Therapy Instructions   May 2024 Details    Sun Loyal Buba Thu Fri Sat        1               2               3               4                 5               6               7               8               9               10               11                 12               13               14               15               16               17               18                 19               20               21               22               23    1.8   4 mg   See details      24      4 mg         25      4 mg           26      4 mg  27      6 mg         28      4 mg         29      4 mg         30      4 mg         31      4 mg           Date Details   05/23 Last INR check     INR: 1.8                Warfarin Therapy Instructions   June 2024 Details    Sun Mon Tue Wed Thu Fri Sat           1      4 mg           2      4 mg         3      6 mg         4      4 mg         5      4 mg         6   1.9   4 mg   See details      7      4 mg         8      4 mg           9      4 mg         10      6 mg         11      4 mg         12      4 mg         13      6 mg         14      4 mg         15      4 mg           16      4 mg         17      6 mg         18      4 mg          19      4 mg         20      6 mg         21               22                 23               24               25               26               27               28                29  30                      Date Details   06/06 This INR check     INR: 1.9       Date of next INR:  04/08/2023

## 2023-03-31 ENCOUNTER — Other Ambulatory Visit: Payer: Self-pay

## 2023-03-31 ENCOUNTER — Ambulatory Visit: Payer: Medicare Other | Attending: Ophthalmology | Admitting: Ophthalmology

## 2023-03-31 ENCOUNTER — Encounter (INDEPENDENT_AMBULATORY_CARE_PROVIDER_SITE_OTHER): Payer: Self-pay | Admitting: Ophthalmology

## 2023-03-31 DIAGNOSIS — H1010 Acute atopic conjunctivitis, unspecified eye: Secondary | ICD-10-CM | POA: Insufficient documentation

## 2023-03-31 DIAGNOSIS — Z9889 Other specified postprocedural states: Secondary | ICD-10-CM | POA: Insufficient documentation

## 2023-03-31 DIAGNOSIS — Z961 Presence of intraocular lens: Secondary | ICD-10-CM | POA: Insufficient documentation

## 2023-03-31 NOTE — Progress Notes (Addendum)
[facility], [facility]  [address]  [place] [address]  Operated by [facility]         Patient Name: [name]  MRN#: [id number]  Birthdate: 1956/05/15    Date of Service: 03/31/2023    Chief Complaint    Post-op Eye         [name] is a 67 y.o. female who presents today for evaluation/consultation of:  HPI    Pt is here for 3 week POV  Pt states that [place] is doing well, but has very poor near [place]  Pt denies eye pain but c/o itching OU  Pt denies any changes to VF  Pt denies new/abnormal floaters but notes that last week when she got out of bed in the middle night she saw FOL OD temporally but notes it never occurred again  Pt is wondering what type of eye drops she should use for comfort  Pt denies using gtts/ung   Last edited by Morene Crocker, COA on 03/31/2023  8:19 AM.        ROS    Positive for: Eyes  Negative for: Constitutional, Gastrointestinal, Neurological, Skin, Genitourinary, Musculoskeletal, HENT, Endocrine, Cardiovascular, Respiratory, Psychiatric, Allergic/Imm, Heme/Lymph  Last edited by Morene Crocker, COA on 03/31/2023  8:19 AM.         All other systems Negative    Elle Tampoya, COA  03/31/2023, 08:21    Base Eye Exam       Visual Acuity (Snellen - Linear)         Right Left    Dist sc 20/20 -2 20/30 +1    Dist ph sc  20/20 -1              Tonometry ([facility], 8:41 AM)         Right Left    Pressure 16 14              Pupils         APD    Right None    Left None              Visual Fields         Right Left     Full Full              Extraocular Movement         Right Left     Full Full              Neuro/Psych       Oriented x3: Yes    Mood/Affect: Normal              Dilation       Both eyes: 1.0% Mydriacyl, 2.5% Phenylephrine @ 8:41 AM                  Slit Lamp and Fundus Exam       External Exam         Right Left    External Normal Normal              Slit Lamp Exam         Right Left    Lids/Lashes Dermatochalasis - upper lid Dermatochalasis -  upper lid    Conjunctiva/Sclera White and quiet White and quiet    Cornea Clear Clear    Anterior Chamber trace cell Deep and quiet    Iris Round and reactive Round and reactive    Lens PCIOL PCIOL  Anterior Vitreous Normal PVD                  Refraction       Manifest Refraction         Sphere Cylinder Axis Dist VA Add Near [place]    Right [place] +0.75 028 20/20 +2.50 20/20    Left [place] +0.75 150 20/20 +2.50 20/20   Statrted MRx from plano  PD 60             Final Rx         Sphere Cylinder Axis Dist VA Add Near [place]    Right [place] +0.75 028 20/20 +2.50 20/20    Left [place] +0.75 150 20/20 +2.50 20/20      Type: PAL    Expiration Date: 03/30/2024                    MD Addition to HPI: Patient here for post op visit #3 right. Patient reports successful completion of drop regimen since last visit.         Satisfactory result of phacoemulsification of cataract with lens implant right eye.    ICD-10-CM    1. 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 Pseudophakia  Z96.1         Plan:Manifest refraction dispensed. Patient to return to [facility] 6 months or prn. Start allaway/Pataday BID    Manifest Refraction       Manifest Refraction         Sphere Cylinder Axis Dist VA Add Near [place]    Right [place] +0.75 028 20/20 +2.50 20/20    Left [place] +0.75 150 20/20 +2.50 20/20   Statrted MRx from plano  PD 60             Edited by: Morene Crocker, COA                     I reviewed and made necessary changes to the technician, resident or fellow note regarding CC/HPI/ROS/Past Family, Medical and Social Hx/Surg Hx.    Marveen Reeks, MD  03/31/2023, 08:54              I reviewed and made necessary changes to the technician, resident or fellow note regarding CC/HPI/ROS/Past Family, Medical and Social Hx/Surg Hx.  Kathleen Argue, MD 03/31/2023, 09:01      I saw and examined the patient.  I reviewed the resident's note.  I agree with the findings and plan of care as documented in the resident's note. I personally reviewed and interpreted all  testing and/or imaging performed at this visit and agree with the resident or fellow's interpretation. Any exceptions/additions are edited/noted.    Kathleen Argue, MD 03/31/2023, 09:01

## 2023-04-06 ENCOUNTER — Encounter (HOSPITAL_COMMUNITY): Payer: Self-pay

## 2023-04-06 ENCOUNTER — Ambulatory Visit (HOSPITAL_COMMUNITY): Payer: Self-pay

## 2023-04-06 LAB — PT/INR: INR: 1.7

## 2023-04-06 NOTE — Progress Notes (Signed)
Anticoagulation Encounter Note:    [name]    Surgical AVR plan   Warfarin start date: 01-06-2019   [facility] Staff: [name] (with fellow)  DX : On-X AVR   INR Range: 1.5 - 2.0  Tab strength: warfarin 2mg    Contact 1: [phone] cell   Contact 2:   Lab: POC/[place] - [facility]   Comments: Tylenol regularly    [facility]VI anticoag SCN: [name] RN ext [phone]    INR: 1.7  Current Dose: 6 mg Monday/Thursday, 4 mg all other days  Clinical Outcomes       Positives:  INR In Range            Plan: 6 mg Th/M, 4 mg all other days; recheck in two weeks. Confirmed dosing and testing with patient via MyChart message.    Next INR Date: 7.3.24    [name], RN SCN  Anticoag clinic  Ext. [phone]         For additional information, see dosing calendar below.      Warfarin Therapy Instructions   June 2024 Details    Sun Loyal Buba Thu Fri Sat           1                 2               3               4               5               6    1.9   4 mg   See details      7      4 mg         8      4 mg           9      4 mg         10      6 mg         11      4 mg         12      4 mg         13      6 mg         14      4 mg         15      4 mg           16      4 mg         17      6 mg         18   1.7   4 mg   See details      19      4 mg         20      6 mg         21      4 mg         22      4 mg           23      4 mg         24      6 mg         25  4 mg         26      4 mg         27      6 mg         28      4 mg         29      4 mg           30      4 mg                Date Details   06/06 Last INR check     INR: 1.9      06/18 This INR check     INR: 1.7                Warfarin Therapy Instructions   July 2024 Details    Sun Loyal Buba Thu Fri Sat      1      6 mg         2      4 mg         3      4 mg         4               5               6                 7               8               9               10               11               12               13                 14               15               16                17               18               19               20                 21               22               23               24               25               26               27                 28                29  30               31                   Date Details   No additional details    Date of next INR:  04/21/2023

## 2023-04-21 ENCOUNTER — Ambulatory Visit (HOSPITAL_COMMUNITY): Payer: Self-pay

## 2023-04-21 ENCOUNTER — Other Ambulatory Visit (HOSPITAL_COMMUNITY): Payer: Self-pay | Admitting: Cardiovascular Disease

## 2023-04-21 ENCOUNTER — Encounter (HOSPITAL_COMMUNITY): Payer: Self-pay

## 2023-04-21 LAB — PT/INR: INR: 1.8

## 2023-04-22 NOTE — Progress Notes (Signed)
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 Confirmed dosing and testing with patient via MyChart message.    Next INR Date: 7.18.24    [name], RN SCN  Anticoag clinic  Ext. [phone]         For additional information, see dosing calendar below.      Warfarin Therapy Instructions   June 2024 Details    Sun Loyal [place] Thu Fri Sat           1                 2               3               4               5               6               7               8                 9               10               11               12               13               14               15                 16               17               18    1.7   4 mg   See details      19      4 mg         20      6 mg         21      4 mg         22      4 mg           23      4 mg         24      6 mg         25      4 mg         26  4 mg         27      6 mg         28      4 mg         29      4 mg           30      4 mg                Date Details   06/18 Last INR check     INR: 1.7              Warfarin Therapy Instructions   July 2024 Details    Sun Mon Tue Wed Thu Fri Sat      1      6 mg         2      4 mg         3   1.8   4 mg   See details      4      6 mg         5      4 mg         6      4 mg           7      4 mg         8      6 mg         9      4 mg         10      4 mg         11      6 mg         12      4 mg         13      4 mg           14      4 mg         15      6 mg         16      4 mg         17      4 mg         18      6 mg         19                20                 21               22               23               24               25               26               27                 28               29                30  31                   Date Details   07/03 This INR check     INR: 1.8       Date of next INR:  05/06/2023

## 2023-05-05 ENCOUNTER — Ambulatory Visit (HOSPITAL_COMMUNITY): Payer: Self-pay

## 2023-05-05 ENCOUNTER — Encounter (HOSPITAL_COMMUNITY): Payer: Self-pay

## 2023-05-05 LAB — PT/INR: INR: 1.9

## 2023-05-05 NOTE — Progress Notes (Signed)
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 Confirmed dosing and testing with patient via MyChart message.    Next INR Date: 7.31.24    [name], RN SCN  Anticoag clinic  Ext. [phone]         For additional information, see dosing calendar below.      Warfarin Therapy Instructions   July 2024 Details    Sun Loyal [place] Thu Fri Sat      1               2               3    1.8   4 mg   See details      4      6 mg         5      4 mg         6      4 mg           7      4 mg         8      6 mg         9      4 mg         10      4 mg         11      6 mg         12      4 mg         13      4 mg           14      4 mg         15      6 mg         16      4 mg         17   1.9   4 mg   See details      18      6 mg         19      4 mg         20      4 mg           21      4 mg         22      6 mg         23      4 mg         24      4 mg         25      6 mg  26      4 mg         27      4 mg           28      4 mg         29      6 mg         30      4 mg         31      4 mg             Date Details   07/03 Last INR check     INR: 1.8      07/17 This INR check     INR: 1.9       Date of next INR:  05/19/2023

## 2023-05-06 ENCOUNTER — Ambulatory Visit (HOSPITAL_BASED_OUTPATIENT_CLINIC_OR_DEPARTMENT_OTHER): Payer: Self-pay | Admitting: Family

## 2023-05-07 ENCOUNTER — Other Ambulatory Visit (HOSPITAL_BASED_OUTPATIENT_CLINIC_OR_DEPARTMENT_OTHER): Payer: Self-pay | Admitting: Surgical

## 2023-05-07 MED ORDER — CLINDAMYCIN HCL 300 MG CAPSULE
ORAL_CAPSULE | ORAL | 0 refills | Status: DC
Start: 2023-05-07 — End: 2023-12-22

## 2023-05-13 ENCOUNTER — Encounter (HOSPITAL_COMMUNITY): Payer: Self-pay

## 2023-05-13 ENCOUNTER — Ambulatory Visit (HOSPITAL_COMMUNITY): Payer: Self-pay

## 2023-05-13 LAB — PT/INR: INR: 1.6

## 2023-05-13 NOTE — Progress Notes (Addendum)
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  Clinical Outcomes       Positives:  INR In Range          Patient Findings       Positives:  Change in medications (amoxicillin 50 mg QUID for dental infection)          Plan: 6 mg Thursday, 4 mg all other days; recheck on Monday. Confirmed dosing and testing with patient via MyChart message.    Next INR Date: 7.29.24    [name], RN SCN  Anticoag clinic  Ext. [phone]        For additional information, see dosing calendar below.      Warfarin Therapy Instructions   July 2024 Details    Sun Loyal [place] Thu Fri Sat      1               2               3               4               5               6                 7               8               9               10               11               12               13                 14               15               16               17    1.9   4 mg   See details      18      6 mg         19      4 mg         20      4 mg           21      4 mg         22      6 mg         23      4 mg         24      4 mg  25   1.6   6 mg   See details      26      4 mg         27      4 mg           28      4 mg         29      6 mg         30               31                   Date Details   07/17 Last INR check     INR: 1.9      07/25 This INR check     INR: 1.6       Date of next INR:  05/17/2023

## 2023-05-17 ENCOUNTER — Encounter (HOSPITAL_COMMUNITY): Payer: Self-pay

## 2023-05-17 ENCOUNTER — Ambulatory Visit (HOSPITAL_COMMUNITY): Payer: Self-pay

## 2023-05-17 LAB — PT/INR: INR: 1.8

## 2023-05-17 NOTE — Progress Notes (Signed)
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 Confirmed dosing and testing with patient via MyChart message.    Next INR Date: 8.12.24    [name], RN SCN  Anticoag clinic  Ext. [phone]         For additional information, see dosing calendar below.      Warfarin Therapy Instructions   July 2024 Details    Sun Loyal [place] Thu Fri Sat      1               2               3               4               5               6                 7               8               9               10               11               12               13                 14               15               16               17               18               19               20                 21               22               23               24               25    1.6   6 mg   See  details      26      4 mg         27      4 mg           28      4 mg         29   1.8   6 mg   See details      30      4 mg         31      4 mg             Date Details   07/25 Last INR check     INR: 1.6      07/29 This INR check     INR: 1.8                Warfarin Therapy Instructions   August 2024 Details    Sun Loyal [place] Thu Fri Sat         1      6 mg         2      4 mg         3      4 mg           4      4 mg         5      6 mg         6      4 mg         7      4 mg         8      6 mg         9      4 mg         10      4 mg           11      4 mg         12      6  mg         13               14               15               16               17                 18               19               20               21               22               23               24                 25               26                27  28               29               30               31                 Date Details   No additional details    Date of next INR:  05/31/2023

## 2023-05-31 ENCOUNTER — Ambulatory Visit (HOSPITAL_COMMUNITY): Payer: Self-pay

## 2023-05-31 ENCOUNTER — Encounter (HOSPITAL_COMMUNITY): Payer: Self-pay

## 2023-05-31 LAB — PT/INR: INR: 1.9

## 2023-05-31 NOTE — Progress Notes (Signed)
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    Next INR Date: 8.26.24    [name], RN SCN  Anticoag clinic  Ext. [phone]         For additional information, see dosing calendar below.      Warfarin Therapy Instructions   July 2024 Details    Sun Loyal [place] Thu Fri Sat      1               2               3               4               5               6                 7               8               9               10               11               12               13                 14               15               16               17               18               19               20                 21               22               23               24               25                26  27                 28               29    1.8   6 mg   See details      30      4 mg         31      4 mg             Date Details   07/29 Last INR check     INR: 1.8                Warfarin Therapy Instructions   August 2024 Details    Sun Loyal [place] Thu Fri Sat         1      6 mg         2      4 mg         3      4 mg           4      4 mg         5      6 mg         6      4 mg         7      4 mg         8      6 mg         9      4 mg         10      4 mg           11      4 mg         12   1.9   6 mg   See details      13      4 mg         14      4 mg         15      6 mg         16      4 mg         17      4 mg           18      4 mg          19      6 mg         20      4 mg         21      4 mg         22      6 mg         23      4 mg         24      4 mg           25      4 mg         26      6 mg         27               28               29  30               31                Date Details   08/12 This INR check     INR: 1.9       Date of next INR:  06/14/2023

## 2023-06-14 ENCOUNTER — Ambulatory Visit (HOSPITAL_COMMUNITY): Payer: Self-pay

## 2023-06-14 ENCOUNTER — Encounter (HOSPITAL_COMMUNITY): Payer: Self-pay

## 2023-06-14 LAB — PT/INR: INR: 1.9

## 2023-06-14 NOTE — Progress Notes (Cosign Needed)
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 Confirmed dosing and testing with patient via MyChart message.    Next INR Date: 9.9.24    [name], RN SCN  Anticoag clinic  Ext. [phone]         For additional information, see dosing calendar below.      Warfarin Therapy Instructions   August 2024 Details    Sun Loyal [place] Thu Fri Sat         1               2               3                 4               5               6               7               8               9               10                 11               12    1.9   6 mg   See details      13      4 mg         14      4 mg         15      6 mg         16      4 mg         17      4 mg           18      4 mg         19      6 mg         20      4 mg         21      4 mg         22      6 mg         23      4 mg         24      4 mg           25      4 mg  26   1.9   6 mg   See details      27      4 mg         28      4 mg         29      6 mg         30      4 mg         31      4 mg          Date Details   08/12 Last INR check     INR: 1.9      08/26 This INR check     INR: 1.9              Warfarin Therapy Instructions   September 2024 Details    Sun Mon Tue Wed Thu Fri Sat     1      4 mg         2      6 mg         3      4 mg         4      4 mg         5      6 mg         6      4 mg         7      4 mg           8      4 mg         9      6 mg         10               11               12               13               14                 15               16                17               18               19               20               21                 22               23               24               25               26               27                28  29               30                     Date Details   No additional details    Date of next INR:  06/28/2023

## 2023-06-28 ENCOUNTER — Encounter (HOSPITAL_COMMUNITY): Payer: Self-pay

## 2023-06-28 ENCOUNTER — Ambulatory Visit (HOSPITAL_COMMUNITY): Payer: Self-pay

## 2023-06-28 LAB — PT/INR: INR: 2

## 2023-06-28 NOTE — Progress Notes (Signed)
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 Confirmed dosing and testing with patient via MyChart message.    Next INR Date: 9.23.24    [name], RN SCN  Anticoag clinic  Ext. [phone]         For additional information, see dosing calendar below.      Warfarin Therapy Instructions   August 2024 Details    Sun Loyal [place] Thu Fri Sat         1               2               3                 4               5               6               7               8               9               10                 11               12               13               14               15               16               17                 18               19               20               21               22               23               24                 25               26    1.9   6 mg   See details  27      4 mg         28      4 mg         29      6 mg         30      4 mg         31      4 mg          Date Details   08/26 Last INR check     INR: 1.9                Warfarin Therapy Instructions   September 2024 Details    Sun Loyal [place] Thu Fri Sat     1      4 mg         2      6 mg         3      4 mg         4      4 mg         5      6 mg         6      4 mg         7      4 mg           8      4 mg         9   2.0   6 mg   See details      10      4 mg         11      4 mg         12      6 mg         13      4 mg         14      4 mg           15      4 mg         16      6 mg         17      4 mg         18      4 mg         19      6 mg          20      4 mg         21      4 mg           22      4 mg         23      6 mg         24               25               26               27               28                  29  30                     Date Details   09/09 This INR check     INR: 2.0       Date of next INR:  07/12/2023

## 2023-06-29 NOTE — Progress Notes (Signed)
I discussed the patient's care with the Resident prior to the patient leaving the clinic. Any significant discussion points are noted.    Leodis Liverpool, MD 06/29/2023, 11:38

## 2023-07-12 ENCOUNTER — Encounter (HOSPITAL_COMMUNITY): Payer: Self-pay

## 2023-07-12 ENCOUNTER — Ambulatory Visit (HOSPITAL_COMMUNITY): Payer: Self-pay

## 2023-07-12 LAB — PT/INR: INR: 1.6

## 2023-07-12 NOTE — Progress Notes (Signed)
Anticoagulation Encounter Note:    [facility]in    Surgical AVR plan   Warfarin start date: 01-06-2019   [facility] Staff: Rankin  DX : On-X AVR   INR Range: 1.5 - 2.0  Tab strength: warfarin 2mg    Contact 1: [phone] cell   Contact 2:   Lab: [facility]/[place] - [facility]   Comments: Tylenol regularly    [facility] anticoag SCN: [name] RN ext [phone]    INR: 1.6  Current Dose: 6 mg M/Th, 4 mg all other days  Clinical Outcomes       Positives:  INR In Range            Plan: 6 mg M/Th, 4 mg all other days; recheck in 2 weeks. Confirmed dosing and testing with patient via MyChart message.    Next INR Date: 10.7.24    [name], RN SCN  Anticoag clinic  Ext. [phone]         For additional information, see dosing calendar below.      Warfarin Therapy Instructions   September 2024 Details    Sun Loyal [place] Thu Fri Sat     1               2               3               4               5               6               7                 8               9    2.0   6 mg   See details      10      4 mg         11      4 mg         12      6 mg         13      4 mg         14      4 mg           15      4 mg         16      6 mg         17      4 mg         18      4 mg         19      6 mg         20      4 mg         21      4 mg           22      4 mg         23   1.6   6 mg   See details      24      4 mg         25      4 mg         26  6 mg         27      4 mg         28      4 mg           29      4 mg         30      6 mg               Date Details   09/09 Last INR check     INR: 2.0      09/23 This INR check     INR: 1.6                Warfarin Therapy Instructions   October 2024 Details    Sun Mon Tue Wed Thu Fri Sat       1      4 mg         2      4 mg         3      6 mg         4      4 mg         5      4 mg           6      4 mg         7      6 mg         8               9               10               11               12                 13               14               15               16               17               18                19                 20               21               22               23               24               25               26                 27               28                29  30               31                  Date Details   No additional details    Date of next INR:  07/26/2023

## 2023-07-26 ENCOUNTER — Ambulatory Visit (HOSPITAL_COMMUNITY): Payer: Self-pay

## 2023-07-26 ENCOUNTER — Encounter (HOSPITAL_COMMUNITY): Payer: Self-pay

## 2023-07-26 LAB — PT/INR: INR: 1.7

## 2023-07-26 NOTE — Progress Notes (Signed)
Anticoagulation Encounter Note:    Rankin    Surgical AVR plan   Warfarin start date: 01-06-2019   [facility] Staff: Rankin  DX : On-X AVR   INR Range: 1.5 - 2.0  Tab strength: warfarin 2mg    Contact 1: [phone] cell   Contact 2:   Lab: [facility]/[place] - [facility]   Comments: Tylenol regularly    [facility] anticoag SCN: [name] RN ext [phone]    INR: 1.7  Current Dose: 6 mg M/Th, 4 mg all other days  Clinical Outcomes       Positives:  INR In Range            Plan: 6 mg M/Th, 4 mg all other days; recheck in 2 weeks. Confirmed dosing and testing with patient via MyChart message.    Next INR Date: 10.21.24    [name], RN SCN  Anticoag clinic  Ext. [phone]         For additional information, see dosing calendar below.      Warfarin Therapy Instructions   September 2024 Details    Sun Loyal [place] Thu Fri Sat     1               2               3               4               5               6               7                 8               9               10               11               12               13               14                 15               16               17               18               19               20               21                 22               23    1.6   6 mg   See details      24      4 mg         25      4 mg         26      6 mg         27  4 mg         28      4 mg           29      4 mg         30      6 mg               Date Details   09/23 Last INR check     INR: 1.6                Warfarin Therapy Instructions   October 2024 Details    Sun Mon Tue Wed Thu Fri Sat       1      4 mg         2      4 mg         3      6 mg         4      4 mg         5      4 mg           6      4 mg         7   1.7   6 mg   See details      8      4 mg         9      4 mg         10      6 mg         11      4 mg         12      4 mg           13      4 mg         14      6 mg         15      4 mg         16      4 mg         17      6 mg         18      4 mg         19      4 mg           20      4 mg          21      6 mg         22               23               24               25               26                 27               28               29                30  31                  Date Details   10/07 This INR check     INR: 1.7       Date of next INR:  08/09/2023

## 2023-07-30 ENCOUNTER — Encounter (HOSPITAL_BASED_OUTPATIENT_CLINIC_OR_DEPARTMENT_OTHER): Payer: Self-pay | Admitting: Family

## 2023-07-30 ENCOUNTER — Other Ambulatory Visit (HOSPITAL_BASED_OUTPATIENT_CLINIC_OR_DEPARTMENT_OTHER): Payer: Self-pay | Admitting: Family

## 2023-07-30 MED ORDER — TERBINAFINE HCL 1 % TOPICAL CREAM
TOPICAL_CREAM | Freq: Two times a day (BID) | CUTANEOUS | 0 refills | Status: DC
Start: 2023-07-30 — End: 2023-08-04

## 2023-08-04 ENCOUNTER — Ambulatory Visit: Payer: Medicare Other | Attending: Family | Admitting: Family

## 2023-08-04 ENCOUNTER — Encounter (HOSPITAL_BASED_OUTPATIENT_CLINIC_OR_DEPARTMENT_OTHER): Payer: Self-pay | Admitting: Family

## 2023-08-04 ENCOUNTER — Other Ambulatory Visit: Payer: Self-pay

## 2023-08-04 DIAGNOSIS — Z85828 Personal history of other malignant neoplasm of skin: Secondary | ICD-10-CM

## 2023-08-04 DIAGNOSIS — L409 Psoriasis, unspecified: Secondary | ICD-10-CM | POA: Insufficient documentation

## 2023-08-04 DIAGNOSIS — R21 Rash and other nonspecific skin eruption: Secondary | ICD-10-CM

## 2023-08-04 DIAGNOSIS — Z08 Encounter for follow-up examination after completed treatment for malignant neoplasm: Secondary | ICD-10-CM

## 2023-08-04 MED ORDER — AMOXICILLIN 500 MG CAPSULE
500.0000 mg | ORAL_CAPSULE | Freq: Two times a day (BID) | ORAL | 0 refills | Status: AC
Start: 2023-08-04 — End: 2023-08-11

## 2023-08-04 MED ORDER — CLOBETASOL 0.05 % TOPICAL CREAM
TOPICAL_CREAM | Freq: Two times a day (BID) | CUTANEOUS | 1 refills | Status: AC
Start: 2023-08-04 — End: ?

## 2023-08-04 NOTE — Progress Notes (Signed)
Dermatology, [facility]  [address]  [place] [address]  [phone]    Date:   08/04/2023  Name: [name]  Age: 67 y.o.    Chief complaint: Skin lesions    HPI  [name] is a 67 y.o. female with a hx of BCC on her left upper lip s/p MOHs on 03/08/2019. Presenting for total body skin exam with concerns for a rash that started at beginning of the month and is worsening over the past week. No rash involvement on the face. Patient reports rash is very itchy. Tried tea tree peppermint wash that calms the itching down. 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 Used triamcinolone with some improvements. Patient denies any recent strep. Patient does report a switch in medication 2 years ago from metoprolol to Toprol-XL. No additional skin-related complaints.     Review of Systems   Constitutional:  Negative for chills and fever.   Skin:  Positive for itching and rash.     Current Medications   amoxicillin (AMOXIL) 500 mg Oral Capsule Take 1 Capsule (500 mg total) by mouth Twice daily for 7 days    ascorbic acid, vitamin C, (VITAMIN C) 500 mg Oral Tablet Take 0.5 Tablets (250 mg total) by mouth Once a day    aspirin 81 mg Oral Tablet, Chewable Take 1 Tablet (81 mg total) by mouth Once a day    Bifidobacterium infantis (ALIGN ORAL) Take by mouth (Patient not taking: Reported on 08/04/2023)    Cholecalciferol, Vitamin D3, 50 mcg (2,000 unit) Oral Capsule Take 2,000 Units by mouth Once a day     clindamycin (CLEOCIN) 300 mg Oral Capsule TAKE 2 CAPSULES (600 MG) BY MOUTH ONE HALF HOUR TO AN HOUR BEFORE DENTAL TREATMENT (Patient not taking: Reported on 08/04/2023)    clindamycin (CLEOCIN) 300 mg Oral Capsule 2 capsules (600 mg) by mouth one half hour to an hour before dental treatment (Patient not taking: Reported on 08/04/2023)    clobetasoL (CORMAX) 0.05 % Solution Apply topically Twice daily    clobetasoL (TEMOVATE) 0.05 % Cream  Apply topically Twice daily    Fluocinolone-Shower Cap 0.01 % Oil Apply to scalp 1-2 times weekly at night. (Patient not taking: Reported on 02/23/2023)    hydroCHLOROthiazide (HYDRODIURIL) 25 mg Oral Tablet Take 1 Tablet (25 mg total) by mouth Once a day    loratadine (CLARITIN) 10 mg Oral Tablet Take 1 Tablet (10 mg total) by mouth Once a day    metoprolol succinate (TOPROL-XL) 50 mg Oral Tablet Sustained Release 24 hr Take 1 Tablet (50 mg total) by mouth Every night    mupirocin (BACTROBAN) 2 % Ointment Apply topically Three times a day to wound of left lower leg (Patient not taking: Reported on 02/23/2023)    ofloxacin (OCUFLOX) 0.3 % Ophthalmic Drops Ophthalmic Solution Administer 1 Drop into the left eye Four times a day (Patient not taking: Reported on 03/31/2023)    ofloxacin (OCUFLOX) 0.3 % Ophthalmic Drops Ophthalmic Solution Administer 1 Drop into the right eye Four times a day (Patient not taking: Reported on 03/31/2023)    prednisoLONE acetate (PRED FORTE) 1 % Ophthalmic Drops, Suspension Administer 1 Drop into the left eye Four times a day (Patient not taking: Reported on 03/31/2023)    prednisoLONE acetate (PRED FORTE) 1 % Ophthalmic Drops, Suspension For use in surgical suite and 4 times daily into right eye as directed (Patient not taking: Reported on 03/31/2023)  prednisoLONE acetate (PRED FORTE) 1 % Ophthalmic Drops, Suspension Administer 1 Drop into the right eye Four times a day (Patient not taking: Reported on 03/31/2023)    triamcinolone acetonide (ARISTOCORT A) 0.1 % Cream Apply topically Twice daily    triamcinolone acetonide 0.1 % Ointment Apply topically Twice per day as needed to rash on body. Use 2-4 weeks, then break.    warfarin (COUMADIN) 2 mg Oral Tablet Take 3 Tablets (6 mg total) by mouth Every evening Or as directed by Anticoag clinic     Allergies   Allergen Reactions    Latex      Added based on information entered during case entry, please review and add reactions, type, and severity  as needed    Iv Contrast Anaphylaxis    Penicillin Itching, Diarrhea and Hives/ Urticaria    Oregano      Hives      Stadol [Butorphanol Tartrate] Mental Status Effect    Tomato Hives/ Urticaria     Past Medical History:   Diagnosis Date    Abdominal hernia 04/29/2020    hx of 3 repairs    Anemia     Aortic stenosis     Arthritis     Atrial fibrillation (CMS HCC) 04/29/2020    one episode  after AVR    Back problem 04/29/2020    LBP    Beta blocker prescribed for left ventricular systolic dysfunction     Blood in stool     positive cologuard.  Has not yet scheduled colonoscopy    Blood thinned due to long-term anticoagulant use 04/29/2020    coumadin    Cancer (CMS HCC)     skin cancer L lip.  Needs MOHS to be scheduled    Chronic pain 04/29/2020    back and right hip    Colon polyp     Dysrhythmias     1 episode of A-fib per patient    Heart murmur 01/04/2019    AVR    High blood pressure     Motion sickness     Obesity     Rash 04/29/2020    scalp psoriasis and involves R ear    Valvular disease     aortic valve stenosis, following with CT surgery, echo 02/2016 and cath 03/2016    Wears glasses      Physical Exam  Vitals: Temperature 36.1 C (97 F), temperature source Thermal Scan, height 1.626 m (5\' 4" ), weight 102 kg (224 lb 6.9 oz), not currently breastfeeding.  Physical Exam  Constitutional:       General: She is not in acute distress.     Appearance: Normal appearance.   HENT:      Head:     Skin:     General: Skin is warm and dry.      Coloration: Skin is not pale.          Neurological:      Mental Status: She is alert and oriented to person, place, and time. Mental status is at baseline.      Coordination: Coordination normal.   Psychiatric:         Mood and Affect: Mood normal.     General skin exam was performed including head, neck, anterior/posterior trunk, bilateral upper & lower extremities and revealed no areas of concern other than those documented.    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 Reviewed risk of skin atrophy with prolonged use.    - Start amoxicillin 500 mg BID for 7 days. Prescription sent.   - Continue triamcinolone 0.1% cream for under the breast.   - Continue clobetasol 0.05% cream. Refill provided.   - Discussed Humira and Narrow band UVB. Patient declined both at this time and would like to contact Cardiologist about changing Metoprolol.   - Advised patient to discuss with Cardiology about switching to back to Metoprolol.       The patient was educated on the importance of avoiding excessive sun exposure and wearing sunscreen daily.  Advised patient to re-apply sunscreen every 2-3 hours.  Advised the patient to avoid going to the tanning beds.  Advised to check skin routinely for any changes, especially any new moles or changes in existing moles.    RTC in 3 months, or sooner if needed     I am scribing for, and in the presence of,  [employer], FNP-BC, for services provided on 08/04/2023.  Alexandra Mascaro, SCRIBE     This is a shared visit with Dr. Jarold Motto     I spent 23 minutes with patient     I personally performed the services described in this documentation, as scribed  in my presence, and it is both accurate  and complete.    Monique Bandy, APRN,FNP-BC    I personally saw and evaluated the patient. See mid-level's note for additional details. My findings/participation are psoriasis; guttate psoriasis - chronic and flared. Cont topical steroids. Start amoxicillin. Recommended UVB or systemic agent. Pt defers at this time.    Cornelius Moras, MD

## 2023-08-05 ENCOUNTER — Encounter (HOSPITAL_COMMUNITY): Payer: Self-pay | Admitting: Nurse Practitioner

## 2023-08-05 ENCOUNTER — Ambulatory Visit: Payer: Medicare Other | Attending: Nurse Practitioner | Admitting: Nurse Practitioner

## 2023-08-05 VITALS — BP 151/96 | HR 72 | Temp 97.5°F | Resp 16 | Ht 65.0 in | Wt 222.9 lb

## 2023-08-05 DIAGNOSIS — I10 Essential (primary) hypertension: Secondary | ICD-10-CM | POA: Insufficient documentation

## 2023-08-05 DIAGNOSIS — Z872 Personal history of diseases of the skin and subcutaneous tissue: Secondary | ICD-10-CM | POA: Insufficient documentation

## 2023-08-05 DIAGNOSIS — L409 Psoriasis, unspecified: Secondary | ICD-10-CM

## 2023-08-05 DIAGNOSIS — I4891 Unspecified atrial fibrillation: Secondary | ICD-10-CM | POA: Insufficient documentation

## 2023-08-05 DIAGNOSIS — Z952 Presence of prosthetic heart valve: Secondary | ICD-10-CM | POA: Insufficient documentation

## 2023-08-05 DIAGNOSIS — I77819 Aortic ectasia, unspecified site: Secondary | ICD-10-CM

## 2023-08-05 DIAGNOSIS — I9789 Other postprocedural complications and disorders of the circulatory system, not elsewhere classified: Secondary | ICD-10-CM | POA: Insufficient documentation

## 2023-08-05 DIAGNOSIS — Z87891 Personal history of nicotine dependence: Secondary | ICD-10-CM

## 2023-08-05 DIAGNOSIS — I350 Nonrheumatic aortic (valve) stenosis: Secondary | ICD-10-CM | POA: Insufficient documentation

## 2023-08-05 NOTE — Progress Notes (Signed)
08/05/2023     CHIEF COMPLAINT: Follow-up    SUBJECTIVE:   This is a very pleasant 67 y.o. year old female with a past medical history significant for severe AS s/p mechanical AVR On-X 21mm, mild MR, aortic dilation, former tobacco dependence.  She presents for follow-up today.  Tells me today that she has developed psoriatic rash which has occurred in the past.  She states that symptoms previously last for 8 months prior to resolving spontaneously.  She had a recent recurrence and followed up with dermatology.  She questions whether beta-blocker has any role in her recent skin eruption.  She otherwise reports no cardiovascular symptoms at the present time.  She has not been measuring her blood pressure regularly, however does report intermittent measurements with typical systolic readings between 135 and 145.  She denies any angina symptoms either at rest or with exertion.  She denies any congestive symptoms today.  She remains compliant with her warfarin therapy.  She does not require any medication refills at the present time.  On presentation she denies chest pain, shortness of breath, dizziness, lightheadedness, syncope or near syncope, PND, orthopnea, or lower extremity edema.  She expresses no further cardiovascular concerns or complaints at present time.      Last Transthoracic Echocardiogram (01/08/20) revealed:   Findings  Left Ventricle:   Normal left ventricular size. Concentric remodeling. Left ventricular systolic function is normal. Ejection Fraction is 63.1 %. No segmental/regional wall motion abnormalities  identified. Left ventricular diastolic parameters are normal.  Right Ventricle:   Normal right ventricular size. Normal right ventricular systolic function. Right ventricular systolic pressure is indeterminate.  Left Atrium:   Severely dilated left atrium.  Right Atrium:   The right atrium is of normal size.  Mitral Valve:   There is mild mitral regurgitation.  Tricuspid Valve:   There is no  evidence of tricuspid regurgitation.  Aortic Valve:   The aortic valve is not well visualized. No Aortic valve stenosis. There is mild aortic regurgitation. AVR, 21mm On-X, mechanical valve is noted. The mean and peak gradients across AV  of 9 mm Hg and 16 mm Hg. Peak velocity across AV of 2 m/sec. DVI of 0.46 and AT of 60 msec.  Pulmonic Valve:   The pulmonic valve is not well visualized. There is no evidence of pulmonic valve regurgitation.  Pulmonary Artery:   The pulmonary artery is not well visualized.  Atrial Septum:   The interatrial septum is normal in appearance.  IVC/Hepatic Veins:   The inferior vena cava is of normal size.  Aorta:   The ascending aorta is mildly dilated. The aortic root is not well visualized.  Pericardium/Pleural space:   Normal pericardium with no pericardial effusion.    Last Cardiac Catheterization (12/06/18) revealed:   IMPRESSION:  1. Normal epicardial coronary arteries.  2. Codominant coronary circulation.      CURRENT MEDICATIONS:  Current Outpatient Medications   Medication Sig    amoxicillin (AMOXIL) 500 mg Oral Capsule Take 1 Capsule (500 mg total) by mouth Twice daily for 7 days    ascorbic acid, vitamin C, (VITAMIN C) 500 mg Oral Tablet Take 0.5 Tablets (250 mg total) by mouth Once a day    aspirin 81 mg Oral Tablet, Chewable Take 1 Tablet (81 mg total) by mouth Once a day    Bifidobacterium infantis (ALIGN ORAL) Take by mouth (Patient not taking: Reported on 08/04/2023)    Cholecalciferol, Vitamin D3, 50 mcg (2,000 unit) Oral Capsule Take  2,000 Units by mouth Once a day     clindamycin (CLEOCIN) 300 mg Oral Capsule TAKE 2 CAPSULES (600 MG) BY MOUTH ONE HALF HOUR TO AN HOUR BEFORE DENTAL TREATMENT (Patient not taking: Reported on 08/04/2023)    clindamycin (CLEOCIN) 300 mg Oral Capsule 2 capsules (600 mg) by mouth one half hour to an hour before dental treatment (Patient not taking: Reported on 08/04/2023)    clobetasoL (CORMAX) 0.05 % Solution Apply topically Twice daily     clobetasoL (TEMOVATE) 0.05 % Cream Apply topically Twice daily    Fluocinolone-Shower Cap 0.01 % Oil Apply to scalp 1-2 times weekly at night. (Patient not taking: Reported on 02/23/2023)    hydroCHLOROthiazide (HYDRODIURIL) 25 mg Oral Tablet Take 1 Tablet (25 mg total) by mouth Once a day    loratadine (CLARITIN) 10 mg Oral Tablet Take 1 Tablet (10 mg total) by mouth Once a day    mupirocin (BACTROBAN) 2 % Ointment Apply topically Three times a day to wound of left lower leg (Patient not taking: Reported on 02/23/2023)    ofloxacin (OCUFLOX) 0.3 % Ophthalmic Drops Ophthalmic Solution Administer 1 Drop into the left eye Four times a day (Patient not taking: Reported on 03/31/2023)    ofloxacin (OCUFLOX) 0.3 % Ophthalmic Drops Ophthalmic Solution Administer 1 Drop into the right eye Four times a day (Patient not taking: Reported on 03/31/2023)    prednisoLONE acetate (PRED FORTE) 1 % Ophthalmic Drops, Suspension Administer 1 Drop into the left eye Four times a day (Patient not taking: Reported on 03/31/2023)    prednisoLONE acetate (PRED FORTE) 1 % Ophthalmic Drops, Suspension For use in surgical suite and 4 times daily into right eye as directed (Patient not taking: Reported on 03/31/2023)    prednisoLONE acetate (PRED FORTE) 1 % Ophthalmic Drops, Suspension Administer 1 Drop into the right eye Four times a day (Patient not taking: Reported on 03/31/2023)    triamcinolone acetonide (ARISTOCORT A) 0.1 % Cream Apply topically Twice daily    triamcinolone acetonide 0.1 % Ointment Apply topically Twice per day as needed to rash on body. Use 2-4 weeks, then break.    warfarin (COUMADIN) 2 mg Oral Tablet Take 3 Tablets (6 mg total) by mouth Every evening Or as directed by Anticoag clinic       REVIEW OF SYSTEMS: Other than ROS in the HPI, all other systems were negative.  ROS   VITAL SIGNS:  BP (!) 151/96 (Site: Right Arm, Patient Position: Sitting)   Pulse 72   Temp 36.4 C (97.5 F)   Resp 16   Ht 1.651 m (5\' 5" )   Wt 101  kg (222 lb 14.2 oz)   SpO2 95%   BMI 37.09 kg/m       PHYSICAL EXAM:  General: No acute distress and appears stated age.    HENT:ENT without erythema or injection, mucouse membranes moist.    Neck: No JVD, no carotid bruit. and supple, symmetrical, trachea midline.   Lungs: Clear to auscultation bilaterally.    Cardiovascular: Regular rate and rhythm with 1/6 systolic ejection murmur aortic position with mechanical click and vascular pulses are 2+ throughout.    Abdomen: Soft, non-tender and bowel sounds normal.    Extremities: Extremities normal, atraumatic, no cyanosis or edema.    Skin: Skin warm and dry.    Neurologic: Alert and oriented x3.    Lab Results   Component Value Date    CHOLESTEROL 175 02/04/2023    HDLCHOL 70 02/04/2023  LDLCHOL 92 02/04/2023    TRIG 68 02/04/2023        Lab Results   Component Value Date    HA1C 5.2 11/15/2020       ASSESSMENT & PLAN:    1.  AS s/p mechanical AVR 21mm On-X   2.  HTN  3.  Chronic Anticoagulation  4.  Psoriatic rash    This is a very pleasant 67 year old female presents to cardiology clinic today for annual follow-up.  Her history is as outlined above.  She is clinically stable from a cardiovascular perspective at the present time.  She has developed a psoriatic type rash for the 2nd occasion.  She was evaluated by dermatology recently and encouraged to follow-up with respect to her beta-blocker possibly adding to these symptoms.  She previously had an episode that lasted 8 months prior to resolving spontaneously.  She has remained compliant with her prescribed medical therapy.  Today we will discontinue her Toprol-XL.  She will remain on hydrochlorothiazide for control of her hypertension.  I have encouraged her to begin to monitor blood pressure regularly at home.  She will measure once in the morning prior to medication and then again around lunchtime.  I have encouraged her to reach out with any consistent elevations, at which time we would consider adding  an additional antihypertensive agent.  Placed orders today for an updated transthoracic echocardiogram given her history aortic dilation which was mild on her most recent prior study.  She will otherwise continue her current medical therapy as prescribed at the present time.  I have encouraged her to reach out with any new or concerning symptoms.    Follow-up:  The patient will follow-up in the Cardiology Clinic in a proximally 6 months or sooner if necessary.    The patient was seen independently with supervising physician not present but available for consultation.    Kermit Balo. Neil Errickson MSN, APRN, FNP-C  Cardiology Nurse Practitioner  [facility]

## 2023-08-09 ENCOUNTER — Encounter (HOSPITAL_BASED_OUTPATIENT_CLINIC_OR_DEPARTMENT_OTHER): Payer: Self-pay | Admitting: Family

## 2023-08-09 ENCOUNTER — Ambulatory Visit (HOSPITAL_COMMUNITY): Payer: Self-pay

## 2023-08-09 ENCOUNTER — Encounter (HOSPITAL_COMMUNITY): Payer: Self-pay

## 2023-08-09 DIAGNOSIS — Z952 Presence of prosthetic heart valve: Secondary | ICD-10-CM

## 2023-08-09 LAB — PT/INR: INR: 1.8

## 2023-08-10 NOTE — Progress Notes (Signed)
Anticoagulation Encounter Note:    [facility]    Surgical AVR plan   Warfarin start date: 01-06-2019   [facility] Staff: [facility]  DX : On-X AVR   INR Range: 1.5 - 2.0  Tab strength: warfarin 2mg    Contact 1: [phone] cell   Contact 2:   Lab: [facility]/[place] [facility]   Comments: Tylenol regularly    [facility] anticoag SCN: [name] RN ext [phone]    INR: 1.8  Current Dose: 6 mg M/Th, 4 mg all other days  Clinical Outcomes       Positives:  INR In Range          Patient Findings       Positives:  Change in medications (metoprolol d/c'd due to suspected rash; starte amoxicillin)          Plan: 4 mg daily; recheck on Thursday. Confirmed dosing and testing with patient via MyChart message.    Next INR Date: 10.24.24    [name], RN SCN  Anticoag clinic  Ext. [phone]          For additional information, see dosing calendar below.      Warfarin Therapy Instructions   October 2024 Details    Sun Loyal B[place] Thu Fri Sat       1               2               3               4               5                 6               7    1.7   6 mg   See details      8      4 mg         9      4 mg         10      6 mg         11      4 mg         12      4 mg           13      4 mg         14      6 mg         15      4 mg         16      4 mg         17      6 mg         18      4 mg         19      4 mg           20      4 mg         21   1.8   4 mg   See details      22      4 mg         23      4 mg         24      6  mg         25               26                 27               28               29               30               31                   Date Details   10/07 Last INR check     INR: 1.7      10/21 This INR check     INR: 1.8       Date of next INR:  08/12/2023

## 2023-08-12 ENCOUNTER — Encounter (HOSPITAL_COMMUNITY): Payer: Self-pay

## 2023-08-12 ENCOUNTER — Ambulatory Visit (HOSPITAL_COMMUNITY): Payer: Self-pay

## 2023-08-12 LAB — PT/INR: INR: 1.8

## 2023-08-12 NOTE — Progress Notes (Signed)
Anticoagulation Encounter Note:    [facility]    Surgical AVR plan   Warfarin start date: 01-06-2019   [facility] Staff: [facility]  DX : On-X AVR   INR Range: 1.5 - 2.0  Tab strength: warfarin 2mg    Contact 1: [phone] cell   Contact 2:   Lab: POC/[place] - [facility]   Comments: Tylenol regularly    [facility] anticoag SCN: [name] RN ext [phone]    INR: 1.8  Current Dose: 4 mg daily  Clinical Outcomes       Positives:  INR In Range          Patient Findings       Positives:  Change in medications (last dose of amoxicillin this evening.)          Plan: 6 mg Th/M, 4 mg all other days; recheck in 2 weeks. Confirmed dosing and testing with patient via MyChart message.  Next INR Date: 11.4.24    [name], RN SCN  Anticoag clinic  Ext. [phone]         For additional information, see dosing calendar below.      Warfarin Therapy Instructions   October 2024 Details    Sun Sheral Flow Tue Wed Thu Fri Sat       1               2               3               4               5                 6               7               8               9               10               11               12                 13               14               15               16               17               18               19                 20               21      4  mg   See details      22      4 mg         23      4 mg         24   1.8   6 mg   See details      25      4 mg  26      4 mg           27      4 mg         28      6 mg         29      4 mg         30      4 mg         31      6 mg            Date Details   10/21 Last INR check      10/24 This INR check     INR: 1.8                Warfarin Therapy Instructions   November 2024 Details    Sun Sheral Flow Tue Wed Thu Fri Sat          1      4 mg         2      4 mg           3      4 mg         4      6 mg         5               6               7               8               9                 10               11               12               13               14               15               16                  17               18               19               20               21               22               23                 24               25               26               27               28                29  30                Date Details   No additional details    Date of next INR:  08/23/2023

## 2023-08-16 ENCOUNTER — Encounter (INDEPENDENT_AMBULATORY_CARE_PROVIDER_SITE_OTHER): Payer: Self-pay | Admitting: Ophthalmology

## 2023-08-16 ENCOUNTER — Other Ambulatory Visit: Payer: Self-pay

## 2023-08-16 ENCOUNTER — Ambulatory Visit: Payer: Medicare Other | Attending: Ophthalmology | Admitting: Ophthalmology

## 2023-08-16 DIAGNOSIS — H35363 Drusen (degenerative) of macula, bilateral: Secondary | ICD-10-CM | POA: Insufficient documentation

## 2023-08-16 DIAGNOSIS — Z961 Presence of intraocular lens: Secondary | ICD-10-CM | POA: Insufficient documentation

## 2023-08-16 NOTE — Progress Notes (Addendum)
[facility]  [address]  [place] [address]  Operated by [facility]         Patient Name: [name]  MRN#: [id number]  Birthdate: 02/24/1956    Date of Service: 08/16/2023    Chief Complaint    Follow Up 6 Months         [name] is a 67 y.o. female who presents today for evaluation/consultation of:  HPI       Follow Up 6 Months     Additional comments: [name] is a 67 y.o. female who is here for a 6 month f/u              Comments    She stated her VA is stable since her LOV   She got her new glasses Rx but she forgot to bring them to her appt today   She stated she has been using the Pataday PRN OU and she sated it it working very well for her. She denies any pain or discomfort in her eyes.    She stated she is still seeing the floaters in her OS only. She stated she hasn't noticed more of them, she stated its staying the same amount. She notices them more in bright light or as the day goes on           Last edited by Marcille Buffy, Ophthalmic Testing Assistant on 08/16/2023  9:48 AM.        ROS    Positive for: Eyes  Negative for: Constitutional, Gastrointestinal, Neurological, Skin, Genitourinary, Musculoskeletal, HENT, Endocrine, Cardiovascular, Respiratory, Psychiatric, Allergic/Imm, Heme/Lymph  Last edited by Marcille Buffy, Ophthalmic Testing Assistant on 08/16/2023  9:44 AM.         All other systems Negative    Marcille Buffy, Ophthalmic Testing Assistant  08/16/2023, 09:48      Base Eye Exam       Visual Acuity (Snellen - Linear)         Right Left    Dist sc 20/20 -1 20/30 +1              Tonometry (Tonopen, 9:54 AM)         Right Left    Pressure 15 16              Pupils         Dark Light Shape React APD    Right 2 2 Round Sluggish None    Left 2 2 Round Sluggish None              Visual Fields (Counting fingers)         Right Left     Full Full              Extraocular Movement         Right Left     Full, Ortho Full, Ortho               Neuro/Psych       Oriented x3: Yes    Mood/Affect: Normal              Dilation       Both eyes: 1.0% Mydriacyl, 2.5% Phenylephrine @ 9:54 AM                  Slit Lamp and Fundus Exam       External Exam         Right Left  External Normal Normal              Slit Lamp Exam         Right Left    Lids/Lashes Dermatochalasis - upper lid Dermatochalasis - upper lid    Conjunctiva/Sclera Trace Injection Trace Injection    Cornea Clear Clear    Anterior Chamber trace cell Deep and quiet    Iris Round and reactive Round and reactive    Lens PCIOL PCIOL    Anterior Vitreous Normal PVD              Fundus Exam         Right Left    Disc Normal Normal    Macula Mottling Mottling    Vessels Normal Normal    Periphery Normal Normal                    MD Addition to HPI: F/U IOL OU seeing well, floaters stable          ENCOUNTER DIAGNOSES     ICD-10-CM   1. 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      Ophthalmic Plan of Care:    IOL OU FINE  Mild HARD DRUSEN OU - monitor    Follow up:    I have asked [name] to follow up 1 year          I have seen and examined the above patient. I discussed the above diagnoses listed in the assessment and the above ophthalmic plan of care with the patient and patient's family. All questions were answered. I reviewed and, when necessary, made changes to the technician/resident note, documented ophthalmology exam, chief complaint, history of present illness, allergies, review of systems, past medical, past surgical, family and social history. I personally reviewed and interpreted all testing and/or imaging performed at this visit and agree with the resident's or fellow's interpretation. Any exceptions/additions are edited/noted in the relevant encounter fields.      Kathleen Argue, MD  08/16/2023, 10:37

## 2023-08-20 ENCOUNTER — Other Ambulatory Visit: Payer: Self-pay

## 2023-08-20 ENCOUNTER — Inpatient Hospital Stay
Admission: RE | Admit: 2023-08-20 | Discharge: 2023-08-20 | Disposition: A | Discharge status: Home / Self Care | Payer: Medicare Other | Source: Ambulatory Visit | Attending: Nurse Practitioner

## 2023-08-20 DIAGNOSIS — I422 Other hypertrophic cardiomyopathy: Secondary | ICD-10-CM

## 2023-08-20 DIAGNOSIS — Z952 Presence of prosthetic heart valve: Secondary | ICD-10-CM | POA: Insufficient documentation

## 2023-08-20 DIAGNOSIS — I3481 Nonrheumatic mitral (valve) annulus calcification: Secondary | ICD-10-CM

## 2023-08-20 DIAGNOSIS — I517 Cardiomegaly: Secondary | ICD-10-CM

## 2023-08-23 LAB — TRANSTHORACIC ECHOCARDIOGRAM - ADULT
Aortic Valve Area by Continuity of Peak Velocity: 1.57 cm2
Aortic Valve Area by Continuity of VTI: 1.89 cm2
FS: 30 %{total} (ref 28–44)
IVS: 1.44 cm — AB (ref 0.6–1.1)
LV Diastolic Volume Index: 64.4 mL
LV Systolic Volume Index: 27.6 mL
LVIDD: 3.86 cm (ref 3.5–6.0)
LVIDS: 2.72 cm (ref 2.1–4.0)
LVOT diameter: 2 cm
LVOT stroke volume: 89.16 mL
LVPWD: 1.25 cm
MV Deceleration Time: 204.73 ms
MV Peak A Vel: 132.75 cm/s
MV Peak E Vel: 112.24 cm/s
Pulmonic Valve Acceleration Time: 41.86 ms
RVDD: 4.02 cm

## 2023-08-25 ENCOUNTER — Ambulatory Visit (HOSPITAL_BASED_OUTPATIENT_CLINIC_OR_DEPARTMENT_OTHER): Payer: Medicare Other | Admitting: Family

## 2023-08-26 ENCOUNTER — Encounter (HOSPITAL_COMMUNITY): Payer: Self-pay

## 2023-08-26 ENCOUNTER — Ambulatory Visit (HOSPITAL_COMMUNITY): Payer: Self-pay

## 2023-08-26 LAB — PT/INR: INR: 2

## 2023-08-26 NOTE — Progress Notes (Signed)
Anticoagulation Encounter Note:    [facility]    Surgical AVR plan   Warfarin start date: 01-06-2019   HVI Staff: [facility]  DX : On-X AVR   INR Range: 1.5 - 2.0  Tab strength: warfarin 2mg    Contact 1: [phone] cell   Contact 2:   Lab: POC/[place] - [facility]   Comments: Tylenol regularly    HVI anticoag SCN: [name] RN ext [phone]    INR: 2.0  Current Dose: 6 mg Th/M, 4 mg all other days  Clinical Outcomes       Positives:  INR In Range            Plan: 4 mg today then resume 6 mg Th/M, 4 mg all other days; recheck in 2 weeks. Confirmed dosing and testing with patient via MyChart message.    Next INR Date: 11.21.24    [name], RN SCN  Anticoag clinic  Ext. [phone]         For additional information, see dosing calendar below.      Warfarin Therapy Instructions   October 2024 Details    Sun Loyal Buba Thu Fri Sat       1               2               3               4               5                 6               7               8               9               10               11               12                 13               14               15               16               17               18               19                 20               21               22               23               24      6  mg   See details      25      4 mg         26      4 mg  27      4 mg         28      6 mg         29      4 mg         30      4 mg         31      6 mg            Date Details   10/24 Last INR check                Warfarin Therapy Instructions   November 2024 Details    Sun Mon Tue Wed Thu Fri Sat          1      4 mg         2      4 mg           3      4 mg         4      6 mg         5      4 mg         6      4 mg         7   2.0   4 mg   See details      8      4 mg         9      4 mg           10      4 mg         11      6 mg         12      4 mg         13      4 mg         14      6 mg         15      4 mg         16      4 mg           17      4 mg         18      6 mg         19      4 mg          20      4 mg         21      6 mg         22               23                 24               25               26               27               28               29                30  Date Details   11/07 This INR check     INR: 2.0       Date of next INR:  09/09/2023

## 2023-09-02 ENCOUNTER — Encounter (INDEPENDENT_AMBULATORY_CARE_PROVIDER_SITE_OTHER): Payer: Self-pay | Admitting: Ophthalmology

## 2023-09-02 NOTE — Telephone Encounter (Signed)
This past Saturday night I s had two small flashes of light and then notice an hour later that my right eye is exhibiting the symptoms of PVD. Same exact symptoms as I had in my left eye just a little over a year ago. Do you think that I need to come in to be seen by you? I know there's nothing that can be done for PVD. That's why I'm asking.     [name]

## 2023-09-06 ENCOUNTER — Inpatient Hospital Stay (HOSPITAL_COMMUNITY)
Admission: RE | Admit: 2023-09-06 | Discharge: 2023-09-06 | Disposition: A | Discharge status: Home / Self Care | Payer: Medicare Other | Source: Ambulatory Visit

## 2023-09-06 ENCOUNTER — Encounter (INDEPENDENT_AMBULATORY_CARE_PROVIDER_SITE_OTHER): Payer: Self-pay | Admitting: Ophthalmology

## 2023-09-06 ENCOUNTER — Other Ambulatory Visit: Payer: Self-pay

## 2023-09-06 ENCOUNTER — Ambulatory Visit: Payer: Medicare Other | Attending: Ophthalmology | Admitting: Ophthalmology

## 2023-09-06 DIAGNOSIS — H35363 Drusen (degenerative) of macula, bilateral: Secondary | ICD-10-CM | POA: Insufficient documentation

## 2023-09-06 DIAGNOSIS — H43811 Vitreous degeneration, right eye: Secondary | ICD-10-CM | POA: Insufficient documentation

## 2023-09-06 DIAGNOSIS — H43393 Other vitreous opacities, bilateral: Secondary | ICD-10-CM | POA: Insufficient documentation

## 2023-09-06 DIAGNOSIS — Z961 Presence of intraocular lens: Secondary | ICD-10-CM | POA: Insufficient documentation

## 2023-09-06 NOTE — Progress Notes (Addendum)
[facility]  [address]  [place] [address]  Operated by [facility]         Patient Name: [name]  MRN#: [id number]  Birthdate: [birth year]    Date of Service: 09/06/2023    Chief Complaint    Eye Exam         Brailee Boudreau is a 67 y.o. female who presents today for evaluation/consultation of:  HPI       Eye Exam    In right eye. Additional comments: Pt states that on 08-28-23 she noticed some flashes of light with OD  Pt denies any trauma/curtain/veil/vision loss  Pt states vision is worth with OD  Pt has floaters  Pt is using Pataday qday ou          Last edited by Louanne Belton, COA on 09/06/2023  1:37 PM.        ROS    Positive for: Cardiovascular, Eyes  Negative for: Constitutional, Gastrointestinal, Neurological, Skin, Genitourinary, Musculoskeletal, HENT, Endocrine, Respiratory, Psychiatric, Allergic/Imm, Heme/Lymph  Last edited by Louanne Belton, COA on 09/06/2023  1:37 PM.         All other systems Negative    Louanne Belton, COA  09/06/2023, 13:40    Base Eye Exam       Visual Acuity (Snellen - Linear)         Right Left    Dist sc 20/30 +2 20/30 +2    Dist ph sc NI NI              Tonometry (Tonopen, 1:45 PM)         Right Left    Pressure 12 14              Pupils         APD    Right None    Left None              Visual Fields (Counting fingers)         Right Left     Full Full              Extraocular Movement         Right Left     Full Full              Neuro/Psych       Oriented x3: Yes    Mood/Affect: Normal              Dilation       Right eye: 1.0% Mydriacyl, 2.5% Phenylephrine @ 1:46 PM                  Slit Lamp and Fundus Exam       Slit Lamp Exam         Right Left    Lids/Lashes Dermatochalasis - upper lid Dermatochalasis - upper lid    Conjunctiva/Sclera Trace Injection Trace Injection    Cornea Clear Clear    Anterior Chamber Deep and quiet Deep and quiet    Iris Round and reactive Round and reactive    Lens Posterior chamber  intraocular lens Posterior chamber intraocular lens                    MD Addition to HPI:     Patient here today to follow up FOL in OD that happened 08-28-23. She notes chronic floaters. No further flashing lights besides the one episode. Otherwise stable. Vision  similar to prior        ENCOUNTER DIAGNOSES     ICD-10-CM   1. 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 I discussed the above diagnoses listed in the assessment and the above ophthalmic plan of care with the patient and patient's family. All questions were answered. I reviewed and, when necessary, made changes to the technician/resident note, documented ophthalmology exam, chief complaint, history of present illness, allergies, review of systems, past medical, past surgical, family and social history. I personally reviewed and interpreted all testing and/or imaging performed at this visit and agree with the resident's or fellow's interpretation. Any exceptions/additions are edited/noted in the relevant encounter fields.        I reviewed and made necessary changes to the technician, resident or fellow note regarding CC/HPI/ROS/Past Family, Medical and Social Hx/Surg Hx.  Kathleen Argue, MD 09/06/2023, 14:52      I saw and examined the patient.  I reviewed the resident's note.  I agree with the findings and plan of care as documented in the resident's note. I personally reviewed and interpreted all testing and/or imaging performed at this visit and agree with the resident or fellow's interpretation. Any exceptions/additions are  edited/noted.    Kathleen Argue, MD 09/06/2023, 14:52

## 2023-09-09 ENCOUNTER — Encounter (HOSPITAL_COMMUNITY): Payer: Self-pay

## 2023-09-09 ENCOUNTER — Ambulatory Visit (HOSPITAL_COMMUNITY): Payer: Self-pay

## 2023-09-09 LAB — PT/INR: INR: 1.8

## 2023-09-09 NOTE — Progress Notes (Signed)
Anticoagulation Encounter Note:    [name]    Surgical AVR plan   Warfarin start date: 01-06-2019   HVI Staff: [name]  DX : On-X AVR   INR Range: 1.5 - 2.0  Tab strength: warfarin 2mg    Contact 1: [phone] cell   Contact 2:   Lab: POC/[place] - [facility]   Comments: Tylenol regularly    HVI anticoag SCN: [name] RN ext [phone]    INR: 1.8  Current Dose: 6 mg Th/M, 4 mg all other days  Clinical Outcomes       Positives:  INR In Range            Plan: 6 mg Tu/M, 4 mg all other days; recheck in two weeks. Confirmed dosing and testing with patient via MyChart message.    Next INR Date: 12.5.24    [name], RN SCN  Anticoag clinic  Ext. [phone]         For additional information, see dosing calendar below.      Warfarin Therapy Instructions   November 2024 Details    Sun Loyal Buba Thu Fri Sat          1               2                 3               4               5               6               7    2.0   4 mg   See details      8      4 mg         9      4 mg           10      4 mg         11      6 mg         12      4 mg         13      4 mg         14      6 mg         15      4 mg         16      4 mg           17      4 mg         18      6 mg         19      4 mg         20      4 mg         21   1.8   6 mg   See details      22      4 mg         23      4 mg           24      4 mg         25      6 mg  26      4 mg         27      4 mg         28      6 mg         29      4 mg         30      4 mg          Date Details   11/07 Last INR check     INR: 2.0      11/21 This INR check     INR: 1.8                Warfarin Therapy Instructions   December 2024 Details    Sun Loyal Buba Thu Fri Sat     1      4 mg         2      6 mg         3      4 mg         4      4 mg         5      6 mg         6               7                 8               9               10               11               12               13               14                 15               16               17               18                19               20               21                 22               23               24               25               26               27               28                 29                30  31                    Date Details   No additional details    Date of next INR:  09/23/2023

## 2023-09-23 ENCOUNTER — Ambulatory Visit (HOSPITAL_COMMUNITY): Payer: Self-pay

## 2023-09-23 ENCOUNTER — Encounter (HOSPITAL_COMMUNITY): Payer: Self-pay

## 2023-09-23 LAB — PT/INR: INR: 1.8

## 2023-09-23 NOTE — Progress Notes (Signed)
Anticoagulation Encounter Note:    [facility]    Surgical AVR plan   Warfarin start date: 01-06-2019   HVI Staff: Rankin  DX : On-X AVR   INR Range: 1.5 - 2.0  Tab strength: warfarin 2mg    Contact 1: [phone] cell   Contact 2:   Lab: POC/[place] - [facility]   Comments: Tylenol regularly    HVI anticoag SCN: [name] RN ext [phone]    INR: 1.8  Current Dose: 6 mg Th/M, 4 mg all other days  Clinical Outcomes       Positives:  INR In Range            Plan: 6 mg Th/M, 4 mg all other days; recheck in 2 weeks. Confirmed dosing and testing with patient via MyChart message.    Next INR Date: 12.19.24    [name], RN SCN  Anticoag clinic  Ext. [phone]         For additional information, see dosing calendar below.      Warfarin Therapy Instructions   November 2024 Details    Sun Loyal Buba Thu Fri Sat          1               2                 3               4               5               6               7               8               9                 10               11               12               13               14               15               16                 17               18               19               20               21    1.8   6 mg   See details      22      4 mg         23      4 mg           24      4 mg         25      6 mg         26      4 mg  27      4 mg         28      6 mg         29      4 mg         30      4 mg          Date Details   11/21 Last INR check     INR: 1.8                Warfarin Therapy Instructions   December 2024 Details    Sun Loyal Buba Thu Fri Sat     1      4 mg         2      6 mg         3      4 mg         4      4 mg         5   1.8   6 mg   See details      6      4 mg         7      4 mg           8      4 mg         9      6 mg         10      4 mg         11      4 mg         12      6 mg         13      4 mg         14      4 mg           15      4 mg         16      6 mg         17      4 mg         18      4 mg         19      6 mg         20                21                 22               23               24               25               26               27               28                 29               30                31  Date Details   12/05 This INR check     INR: 1.8       Date of next INR:  10/07/2023

## 2023-10-04 ENCOUNTER — Ambulatory Visit (INDEPENDENT_AMBULATORY_CARE_PROVIDER_SITE_OTHER): Payer: Self-pay | Admitting: Ophthalmology

## 2023-10-06 ENCOUNTER — Encounter (HOSPITAL_COMMUNITY): Payer: Self-pay

## 2023-10-06 ENCOUNTER — Ambulatory Visit (HOSPITAL_COMMUNITY): Payer: Self-pay

## 2023-10-06 LAB — PT/INR: INR: 1.7

## 2023-10-06 NOTE — Progress Notes (Signed)
Anticoagulation Encounter Note:    [facility]    Surgical AVR plan   Warfarin start date: 01-06-2019   HVI Staff: [facility]in  DX : On-X AVR   INR Range: 1.5 - 2.0  Tab strength: warfarin 2mg    Contact 1: [phone] cell   Contact 2:   Lab: POC/ - [facility]   Comments: Tylenol regularly    HVI anticoag SCN: [name] RN ext [phone]    INR: 1.7  Current Dose: 6 mg M/Th, 4 mg all other days  Clinical Outcomes       Positives:  INR In Range            Plan: 6 mg M/Th, 4 mg all other days; recheck in two weeks when patient returns from vacation. Confirmed dosing and testing with patient via MyChart message.    Next INR Date: 1.6.25    [name], RN SCN  Anticoag clinic  Ext. [phone]         For additional information, see dosing calendar below.      Warfarin Therapy Instructions   December 2024 Details    Sun Loyal Buba Thu Fri Sat     1               2               3               4               5    1.8   6 mg   See details      6      4 mg         7      4 mg           8      4 mg         9      6 mg         10      4 mg         11      4 mg         12      6 mg         13      4 mg         14      4 mg           15      4 mg         16      6 mg         17      4 mg         18   1.7   4 mg   See details      19      6 mg         20      4 mg         21      4 mg           22      4 mg         23      6 mg         24      4 mg         25      4 mg  26      6 mg         27      4 mg         28      4 mg           29      4 mg         30      6 mg         31      4 mg              Date Details   12/05 Last INR check     INR: 1.8      12/18 This INR check     INR: 1.7                Warfarin Therapy Instructions   January 2025 Details    Sun Mon Tue Wed Thu Fri Sat        1      4 mg         2      6 mg         3      4 mg         4      4 mg           5      4 mg         6      6 mg         7               8               9               10               11                 12               13               14                15               16               17               18                 19               20               21               22               23               24               25                 26               27                28  29               30               31                  Date Details   No additional details    Date of next INR:  10/25/2023

## 2023-10-25 ENCOUNTER — Ambulatory Visit (HOSPITAL_COMMUNITY): Payer: Self-pay

## 2023-10-25 ENCOUNTER — Encounter (HOSPITAL_COMMUNITY): Payer: Self-pay

## 2023-10-25 LAB — PT/INR: INR: 2.1

## 2023-10-25 NOTE — Progress Notes (Signed)
Anticoagulation Encounter Note:    [facility]    Surgical AVR plan   Warfarin start date: 01-06-2019   HVI Staff: Rankin  DX : On-X AVR   INR Range: 1.5 - 2.0  Tab strength: warfarin 2mg    Contact 1: [phone] cell   Contact 2:   Lab: POC/[place] - [facility]   Comments: Tylenol regularly    HVI anticoag SCN: [name] RN ext [phone]    INR: 2.1  Current Dose: 6 mg M/Th, 4 mg all other days  Clinical Outcomes       Positives:  INR Above Goal            Plan: 6 mg M/Th, 4 mg all other days; recheck in 2 weeks. Confirmed dosing and testing with patient via MyChart message.    Next INR Date: 1.20.25    [name], RN SCN  Anticoag clinic  Ext. [phone]         For additional information, see dosing calendar below.      Warfarin Therapy Instructions   December 2024 Details    Sun Loyal Buba Thu Fri Sat     1               2               3               4               5               6               7                 8               9               10               11               12               13               14                 15               16               17               18    1.7   4 mg   See details      19      6 mg         20      4 mg         21      4 mg           22      4 mg         23      6 mg         24      4 mg         25      4 mg         26      6 mg  27      4 mg         28      4 mg           29      4 mg         30      6 mg         31      4 mg              Date Details   12/18 Last INR check     INR: 1.7                Warfarin Therapy Instructions   January 2025 Details    Sun Mon Tue Wed Thu Fri Sat        1      4 mg         2      6 mg         3      4 mg         4      4 mg           5      4 mg         6   2.1   6 mg   See details      7      4 mg         8      4 mg         9      6 mg         10      4 mg         11      4 mg           12      4 mg         13      6 mg         14      4 mg         15      4 mg         16      6 mg         17      4 mg         18      4 mg            19      4 mg         20      6 mg         21               22               23               24               25                 26               27               28                29  30               31                 Date Details   01/06 This INR check     INR: 2.1       Date of next INR:  11/08/2023

## 2023-10-28 ENCOUNTER — Encounter (HOSPITAL_BASED_OUTPATIENT_CLINIC_OR_DEPARTMENT_OTHER): Payer: Self-pay | Admitting: Family

## 2023-10-28 ENCOUNTER — Other Ambulatory Visit: Payer: Self-pay

## 2023-10-28 ENCOUNTER — Ambulatory Visit: Payer: Medicare Other | Attending: Family | Admitting: Family

## 2023-10-28 VITALS — Ht 65.0 in | Wt 225.1 lb

## 2023-10-28 DIAGNOSIS — Z08 Encounter for follow-up examination after completed treatment for malignant neoplasm: Secondary | ICD-10-CM

## 2023-10-28 DIAGNOSIS — D485 Neoplasm of uncertain behavior of skin: Secondary | ICD-10-CM | POA: Insufficient documentation

## 2023-10-28 DIAGNOSIS — Z85828 Personal history of other malignant neoplasm of skin: Secondary | ICD-10-CM

## 2023-10-28 MED ORDER — LIDOCAINE 1 %-EPINEPHRINE 1:100,000 INJECTION SOLUTION
1.0000 mL | INTRAMUSCULAR | Status: AC
Start: 2023-10-28 — End: 2023-10-28
  Administered 2023-10-28: 10 mg via INTRADERMAL

## 2023-10-28 NOTE — Nursing Note (Signed)
10/28/23 1110   Time Out Procedure   Consent Obtained for Procedure Yes   Correct Procedure Verbalized (Per Patient/Team) Yes   Allergies Verified: Yes   Correct Site/Side Verbalized Yes   Time Out Performed Yes   Participants Introduced/Known Yes   Participants of Time Out Jerlyn Ly, LPN   Time Out Verified By Gabriela Eves   Time 1110   Date  10/28/23   Staff Completing Form SS     Jerlyn Ly, LPN

## 2023-10-28 NOTE — Progress Notes (Signed)
Dermatology, [facility]  [address]  [place] [address]  [phone]    Date:   10/28/2023  Name: [name]  Age: 68 y.o.    Chief complaint: Skin lesions    HPI  [name] is a 68 y.o. female with a hx of BCC on her left upper lip s/p MOHs on 03/08/2019. Presenting for a new lesion on her right forearm that is growing and tender and been present for 2 months. No additional skin-related complaints.     Review of Systems   Constitutional:  Negative for chills and fever.   Skin:  Negative for itching and rash.     Current Medications   ascorbic acid, vitamin C, (VITAMIN C) 500 mg Oral Tablet Take 0.5 Tablets (250 mg total) by mouth Once a day    aspirin 81 mg Oral Tablet, Chewable Take 1 Tablet (81 mg total) by mouth Once a day    Bifidobacterium infantis (ALIGN ORAL) Take by mouth (Patient not taking: Reported on 10/28/2023)    Cholecalciferol, Vitamin D3, 50 mcg (2,000 unit) Oral Capsule Take 2,000 Units by mouth Once a day     clindamycin (CLEOCIN) 300 mg Oral Capsule TAKE 2 CAPSULES (600 MG) BY MOUTH ONE HALF HOUR TO AN HOUR BEFORE DENTAL TREATMENT (Patient not taking: Reported on 10/28/2023)    clindamycin (CLEOCIN) 300 mg Oral Capsule 2 capsules (600 mg) by mouth one half hour to an hour before dental treatment    clobetasoL (CORMAX) 0.05 % Solution Apply topically Twice daily    clobetasoL (TEMOVATE) 0.05 % Cream Apply topically Twice daily    Fluocinolone-Shower Cap 0.01 % Oil Apply to scalp 1-2 times weekly at night.    hydroCHLOROthiazide (HYDRODIURIL) 25 mg Oral Tablet Take 1 Tablet (25 mg total) by mouth Once a day    loratadine (CLARITIN) 10 mg Oral Tablet Take 1 Tablet (10 mg total) by mouth Once a day    mupirocin (BACTROBAN) 2 % Ointment Apply topically Three times a day to wound of left lower leg    ofloxacin (OCUFLOX) 0.3 % Ophthalmic Drops Ophthalmic Solution Administer 1 Drop into the left eye Four times a day    ofloxacin (OCUFLOX) 0.3 % Ophthalmic Drops Ophthalmic  Solution Administer 1 Drop into the right eye Four times a day    olopatadine (PATADAY ONCE DAILY RELIEF) 0.2 % Ophthalmic Drops Instill 1 Drop into both eyes Once a day    prednisoLONE acetate (PRED FORTE) 1 % Ophthalmic Drops, Suspension Administer 1 Drop into the left eye Four times a day    prednisoLONE acetate (PRED FORTE) 1 % Ophthalmic Drops, Suspension For use in surgical suite and 4 times daily into right eye as directed    prednisoLONE acetate (PRED FORTE) 1 % Ophthalmic Drops, Suspension Administer 1 Drop into the right eye Four times a day    triamcinolone acetonide (ARISTOCORT A) 0.1 % Cream Apply topically Twice daily    triamcinolone acetonide 0.1 % Ointment Apply topically Twice per day as needed to rash on body. Use 2-4 weeks, then break.    warfarin (COUMADIN) 2 mg Oral Tablet Take 3 Tablets (6 mg total) by mouth Every evening Or as directed by Anticoag clinic     Allergies   Allergen Reactions    Latex      Added based on information entered during case entry, please review and add reactions, type, and severity as needed    Iv Contrast Anaphylaxis    Metoprolol Rash  Oregano      Hives      Stadol [Butorphanol Tartrate] Mental Status Effect    Tomato Hives/ Urticaria     Past Medical History:   Diagnosis Date    Abdominal hernia 04/29/2020    hx of 3 repairs    Anemia     Aortic stenosis     Arthritis     Atrial fibrillation (CMS HCC) 04/29/2020    one episode  after AVR    Back problem 04/29/2020    LBP    Beta blocker prescribed for left ventricular systolic dysfunction     Blood in stool     positive cologuard.  Has not yet scheduled colonoscopy    Blood thinned due to long-term anticoagulant use 04/29/2020    coumadin    Cancer (CMS HCC)     skin cancer L lip.  Needs MOHS to be scheduled    Chronic pain 04/29/2020    back and right hip    Colon polyp     Dysrhythmias     1 episode of A-fib per patient    Heart murmur 01/04/2019    AVR    High blood pressure     Motion sickness     Obesity      Rash 04/29/2020    scalp psoriasis and involves R ear    Valvular disease     aortic valve stenosis, following with CT surgery, echo 02/2016 and cath 03/2016    Wears glasses      Physical Exam  Vitals: Height 1.651 m (5\' 5" ), weight 102 kg (225 lb 1.4 oz), not currently breastfeeding.  Physical Exam  Constitutional:       General: She is not in acute distress.     Appearance: Normal appearance.   HENT:      Head:     Skin:     General: Skin is warm and dry.      Coloration: Skin is not pale.          Neurological:      Mental Status: She is alert and oriented to person, place, and time. Mental status is at baseline.      Coordination: Coordination normal.   Psychiatric:         Mood and Affect: Mood normal.         Assessment and Plan    #History of BCC s/p MOHs on 03/08/2019 (see #1 on face diagram) - stable   - No reoccurrence noted  - Will follow     #Neoplasm of uncertain behavior  (# 1 on body diagram) - new  [address] Tangential Shave BX OF SKIN code in procedures and charges   -Clinical photography taken  -Size: see diagram above  - Location of biopsy: right forearm  - Differential diagnosis: Keratoacanthoma vs invasive SCC   - TIME OUT: A time out was performed to confirm the correct patient, procedure, and site. Consent obtained, area cleaned, and anesthetized. A shave biopsy was performed.Aluminum chloride was used for hemostasis. Vaseline and bandage were placed over the wound and wound care instructions were given. Patient demonstrated understanding of the instructions. Patient was advised that it would take approximately 2-3 weeks for the pathology results to be available and that I will personally contact the patient at the number provided to further discuss the pathology results.    RTC in 6 months or sooner if needed    This visit was rendered independently without a supervising physician on  site.     Nicholaus Bloom, APRN,FNP-BC

## 2023-11-01 DIAGNOSIS — C44622 Squamous cell carcinoma of skin of right upper limb, including shoulder: Secondary | ICD-10-CM

## 2023-11-01 LAB — SURGICAL PATHOLOGY SPECIMEN

## 2023-11-02 ENCOUNTER — Other Ambulatory Visit (HOSPITAL_BASED_OUTPATIENT_CLINIC_OR_DEPARTMENT_OTHER): Payer: Self-pay | Admitting: Family

## 2023-11-02 DIAGNOSIS — C44622 Squamous cell carcinoma of skin of right upper limb, including shoulder: Secondary | ICD-10-CM

## 2023-11-08 ENCOUNTER — Encounter (HOSPITAL_COMMUNITY): Payer: Self-pay

## 2023-11-08 ENCOUNTER — Ambulatory Visit (HOSPITAL_COMMUNITY): Payer: Self-pay

## 2023-11-08 LAB — PT/INR: INR: 2.4

## 2023-11-08 NOTE — Progress Notes (Signed)
Anticoagulation Encounter Note:    [name]    Surgical AVR plan   Warfarin start date: 01-06-2019   [facility] Staff: [name]  DX : On-X AVR   INR Range: 1.5 - 2.0  Tab strength: warfarin 2mg    Contact 1: [phone] cell   Contact 2:   Lab: [facility] - [facility]   Comments: Tylenol regularly    [facility] anticoag SCN: [name] RN ext [phone]    INR: 2.4  Current Dose: 6 mg Th/M, 4 mg all other days  Clinical Outcomes       Positives:  INR Above Goal            Plan: 6 mg Thursdays, 4 mg all other days; recheck in two weeks. Confirmed dosing and testing with patient via MyChart message.    Next INR Date: 2.3.25    [name], RN SCN  Anticoag clinic  Ext. [phone]         For additional information, see dosing calendar below.      Warfarin Therapy Instructions   January 2025 Details    Sun Mon Tue Wed Thu Fri Sat        1               2               3               4                 5               6    2.1   6 mg   See details      7      4 mg         8      4 mg         9      6 mg         10      4 mg         11      4 mg           12      4 mg         13      6 mg         14      4 mg         15      4 mg         16      6 mg         17      4 mg         18      4 mg           19      4 mg         20   2.4   4 mg   See details      21      4 mg         22      4 mg         23      6 mg         24      4 mg         25      4 mg  26      4 mg         27      4 mg         28      4 mg         29      4 mg         30      6 mg         31      4 mg           Date Details   01/06 Last INR check     INR: 2.1      01/20 This INR check     INR: 2.4                Warfarin Therapy Instructions   February 2025 Details    Sun Mon Tue Wed Thu Fri Sat           1      4 mg           2      4 mg         3      6 mg         4               5               6               7               8                 9               10               11               12               13               14               15                 16                17               18               19               20               21               22                 23               24               25               26               27                28  Date Details   No additional details    Date of next INR:  11/22/2023

## 2023-11-22 ENCOUNTER — Encounter (HOSPITAL_COMMUNITY): Payer: Self-pay

## 2023-11-22 ENCOUNTER — Ambulatory Visit (HOSPITAL_COMMUNITY): Payer: Self-pay

## 2023-11-22 LAB — PT/INR: INR: 1.8

## 2023-11-22 NOTE — Progress Notes (Signed)
Anticoagulation Encounter Note:    [facility]    Surgical AVR plan   Warfarin start date: 01-06-2019   [facility] Staff: [facility]  DX : On-X AVR   INR Range: 1.5 - 2.0  Tab strength: warfarin 2mg    Contact 1: [phone] cell   Contact 2:   Lab: [facility]/[place] - [facility]   Comments: Tylenol regularly    [facility] anticoag SCN: [name] RN ext [phone]    INR: 1.8  Current Dose: 6 mg Thursday, 4 mg all other days  Clinical Outcomes       Positives:  INR In Range            Plan: 6 mg Thursdays, 4 mg all other days; recheck in two weeks. Confirmed dosing and testing with patient via MyChart message.    Next INR Date: 2.17.25    [name], RN SCN  Anticoag clinic  Ext. [phone]         For additional information, see dosing calendar below.      Warfarin Therapy Instructions   January 2025 Details    Sun Mon Tue Wed Thu Fri Sat        1               2               3               4                 5               6               7               8               9               10               11                 12               13               14               15               16               17               18                 19               20    2.4   4 mg   See details      21      4 mg         22      4 mg         23      6 mg         24      4 mg         25      4 mg           26      4 mg  27      4 mg         28      4 mg         29      4 mg         30      6 mg         31      4 mg           Date Details   01/20 Last INR check     INR: 2.4                Warfarin Therapy Instructions   February 2025 Details    Sun Mon Tue Wed Thu Fri Sat           1      4 mg           2      4 mg         3   1.8   4 mg   See details      4      4 mg         5      4 mg         6      6 mg         7      4 mg         8      4 mg           9      4 mg         10      4 mg         11      4 mg         12      4 mg         13      6 mg         14      4 mg         15      4 mg           16      4 mg         17      6 mg         18                19               20               21               22                 23               24               25               26               27               28                  Date Details   02/03 This INR check  INR: 1.8       Date of next INR:  12/06/2023

## 2023-11-29 ENCOUNTER — Encounter (HOSPITAL_BASED_OUTPATIENT_CLINIC_OR_DEPARTMENT_OTHER): Payer: Self-pay

## 2023-11-29 ENCOUNTER — Ambulatory Visit
Admission: RE | Admit: 2023-11-29 | Discharge: 2023-11-29 | Disposition: A | Discharge status: Home / Self Care | Payer: Medicare Other | Source: Ambulatory Visit | Attending: Family

## 2023-11-29 ENCOUNTER — Other Ambulatory Visit: Payer: Self-pay

## 2023-11-29 ENCOUNTER — Ambulatory Visit (HOSPITAL_BASED_OUTPATIENT_CLINIC_OR_DEPARTMENT_OTHER): Payer: Self-pay | Admitting: Family

## 2023-11-29 DIAGNOSIS — Z1231 Encounter for screening mammogram for malignant neoplasm of breast: Secondary | ICD-10-CM | POA: Insufficient documentation

## 2023-12-01 DIAGNOSIS — H35363 Drusen (degenerative) of macula, bilateral: Secondary | ICD-10-CM

## 2023-12-06 ENCOUNTER — Ambulatory Visit (HOSPITAL_COMMUNITY): Payer: Self-pay

## 2023-12-06 ENCOUNTER — Encounter (HOSPITAL_COMMUNITY): Payer: Self-pay

## 2023-12-06 LAB — PT/INR: INR: 2.1

## 2023-12-06 NOTE — Progress Notes (Signed)
Anticoagulation Encounter Note:    [name]    Surgical AVR plan   Warfarin start date: 01-06-2019   [facility] Staff: [name]  DX : On-X AVR   INR Range: 1.5 - 2.0  Tab strength: warfarin 2mg    Contact 1: [phone] cell   Contact 2:   Lab: [facility] - [facility]   Comments: Tylenol regularly    [facility] anticoag SCN: [name] RN ext [phone]    INR: 2.1  Current Dose: 6 mg Thursdays, 4 mg all other days  Clinical Outcomes       Positives:  INR Above Goal            Plan: 6 mg Thursdays, 4 mg all other days; slightly increase vitamin K intake, recheck in 2 weeks. Confirmed dosing and testing with patient via MyChart message.    Next INR Date: 3.3.25    [name] Alm, RN SCN  Anticoag clinic  Ext. [phone]         For additional information, see dosing calendar below.      Warfarin Therapy Instructions   February 2025 Details    Sun Mon Tue Wed Thu Fri Sat           1                 2               3    1.8   4 mg   See details      4      4 mg         5      4 mg         6      6 mg         7      4 mg         8      4 mg           9      4 mg         10      4 mg         11      4 mg         12      4 mg         13      6 mg         14      4 mg         15      4 mg           16      4 mg         17   2.1   4 mg   See details      18      4 mg         19      4 mg         20      6 mg         21      4 mg         22      4 mg           23      4 mg         24      4 mg  25      4 mg         26      4 mg         27      6 mg         28      4 mg           Date Details   02/03 Last INR check     INR: 1.8      02/17 This INR check     INR: 2.1                Warfarin Therapy Instructions   March 2025 Details    Sun Mon Tue Wed Thu Fri Sat           1      4 mg           2      4 mg         3      6 mg         4               5               6               7               8                 9               10               11               12               13               14               15                 16               17                18               19               20               21               22                 23               24               25               26               27               28               29                  30  31                     Date Details   No additional details    Date of next INR:  12/20/2023

## 2023-12-08 ENCOUNTER — Encounter (HOSPITAL_BASED_OUTPATIENT_CLINIC_OR_DEPARTMENT_OTHER): Payer: Self-pay | Admitting: Family

## 2023-12-09 ENCOUNTER — Encounter (HOSPITAL_BASED_OUTPATIENT_CLINIC_OR_DEPARTMENT_OTHER): Payer: Self-pay | Admitting: Family

## 2023-12-10 ENCOUNTER — Ambulatory Visit (INDEPENDENT_AMBULATORY_CARE_PROVIDER_SITE_OTHER): Payer: Self-pay | Admitting: Nurse Practitioner

## 2023-12-10 NOTE — Telephone Encounter (Addendum)
 Attempted to call the patient, left a voicemail for her to return my phone call.    [name] Balo. [name] Houlton, [name], [name], [name]  Cardiology Nurse Practitioner  [facility]    ----- Message from [name] Stain, [name] sent at 12/08/2023  2:40 PM EST -----  Regarding: FW: New Appointment  ----- Message from [name] sent at 12/08/2023  2:33 PM EST -----  Copied From CRM #[id number].[name] () called to schedule an appointment.     [name]     Patient is asking for a call back regarding her upcoming Move and what Her step should be for establishing at a another facility     Can be reached at the number  provider

## 2023-12-12 ENCOUNTER — Other Ambulatory Visit (HOSPITAL_BASED_OUTPATIENT_CLINIC_OR_DEPARTMENT_OTHER): Payer: Self-pay | Admitting: GENERAL PRACTICE

## 2023-12-15 ENCOUNTER — Encounter (INDEPENDENT_AMBULATORY_CARE_PROVIDER_SITE_OTHER): Payer: Self-pay | Admitting: Nurse Practitioner

## 2023-12-15 ENCOUNTER — Other Ambulatory Visit: Payer: Self-pay

## 2023-12-15 ENCOUNTER — Ambulatory Visit: Payer: Self-pay | Attending: Dermatology | Admitting: Dermatology

## 2023-12-15 VITALS — BP 136/76 | Temp 97.4°F | Ht 65.0 in | Wt 220.2 lb

## 2023-12-15 DIAGNOSIS — D485 Neoplasm of uncertain behavior of skin: Secondary | ICD-10-CM | POA: Insufficient documentation

## 2023-12-15 DIAGNOSIS — C44622 Squamous cell carcinoma of skin of right upper limb, including shoulder: Secondary | ICD-10-CM | POA: Insufficient documentation

## 2023-12-15 HISTORY — PX: SKIN CANCER EXCISION: SHX779

## 2023-12-15 MED ORDER — LIDOCAINE 1 %-EPINEPHRINE 1:100,000 INJECTION SOLUTION
12.0000 mL | INTRAMUSCULAR | Status: AC
Start: 2023-12-15 — End: 2023-12-15
  Administered 2023-12-15: 120 mg via INTRADERMAL

## 2023-12-15 NOTE — Addendum Note (Signed)
 Addended by: Sharlyn Bologna on: 12/15/2023 02:10 PM     Modules accepted: Orders

## 2023-12-15 NOTE — Progress Notes (Signed)
 S: [name] is a 68 y.o. female presenting for excision of an SCC on the right forearm. Patient is feeling well today and would like to proceed with excision.     O: not currently breastfeeding.     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  - Deep and lateral margins are involved.    - EXCISION PROCEDURE: The procedure, risks, benefits, alternatives and expected outcomes were discussed with the patient and consent was obtained. Patient identified, procedure verified, site identified, and verified. A time out was performed at 1115 to confirm the correct patient, procedure,and site. The site was outlined and measured. The original size of the site was noted to be 1.2 x 1.0.  The lesion plus necessary margin was noted to be 2.2 x 2.0.  An S-plasty was designed around the lesion to conform to relax skin tension lines in an effort to minimize scarring and deformity of surrounding structures.  The S-plasty size was 2.2 x 6.9 cm.  The patient was then placed in a reclined position. The lesion and surrounding skin were prepped with cleansing solution, draped, and anesthetized with lidocaine with epinephrine. Using a #15 blade the skin was excised along the marked lines. The resulting defect extended through the deep subcutaneous tissue. Wound margins were extensively undermined 1 cm to the left and right of the full length of the wound perpendicular to the long axis of the wound at the appropriate level in an effort to limit functional deformity of the adjacent tissue and to reduce tension on the wound margins. Bleeding vessels were controlled with cautery. The resulting dead space was closed and wound edges opposed with multiple buried 3-0 Monocryl sutures, resulting in an intermediate closure.  Wound margins were meticulously closed with running 4-0 prolene suture, resulting in a linear  closure with little to no wound tension. Total length of final closure measured  7.7 cm. A surgical pressure dressing was placed over the wound. Postoperative care instructions were reviewed with the patient. Expectations for scarring, infection, and bleeding were discussed with patient. The patient was advised to return to the clinic in approximately 10-14 days for suture removal. The patient left the dermatology suite alert and oriented. All questions were answered prior to discharge.    Corbin Ade MD   PGY-4 Dermatology  [facility]     See resident's note for details.  I saw and examined the patient and agree with the resident's findings and plan as written except as noted, and I was present and supervised/observed the entirety of all minor procedures lasting less than 5 minutes, and the critical portion of all major procedures.     Rudene Christians, MD

## 2023-12-17 ENCOUNTER — Ambulatory Visit (HOSPITAL_BASED_OUTPATIENT_CLINIC_OR_DEPARTMENT_OTHER): Payer: Self-pay | Admitting: DERMATOLOGY

## 2023-12-17 DIAGNOSIS — D485 Neoplasm of uncertain behavior of skin: Secondary | ICD-10-CM

## 2023-12-17 LAB — SURGICAL PATHOLOGY SPECIMEN

## 2023-12-20 ENCOUNTER — Ambulatory Visit (HOSPITAL_COMMUNITY): Payer: Self-pay

## 2023-12-20 ENCOUNTER — Encounter (HOSPITAL_COMMUNITY): Payer: Self-pay

## 2023-12-20 LAB — PT/INR: INR: 1.8

## 2023-12-20 NOTE — Progress Notes (Signed)
 Anticoagulation Encounter Note:    [facility]    Surgical AVR plan   Warfarin start date: 01-06-2019   [facility] Staff: [facility]  DX : On-X AVR   INR Range: 1.5 - 2.0  Tab strength: warfarin 2mg    Contact 1: [phone] cell   Contact 2:   Lab: POC/[place] - [facility]   Comments: Tylenol regularly    [facility] anticoag SCN: [name] RN ext [phone]    INR: 1.8  Current Dose: 6 mg Thursdays, 4 mg all other days  Clinical Outcomes       Positives:  INR In Range            Plan: 6 mg Thursdays, 4 mg all other days; recheck in 2 weeks. Confirmed dosing and testing with patient via MyChart message.    Next INR Date: 3.17.25    [name], RN SCN  Anticoag clinic  Ext. [phone]         For additional information, see dosing calendar below.      Warfarin Therapy Instructions   February 2025 Details    Sun Mon Tue Wed Thu Fri Sat           1                 2               3               4               5               6               7               8                 9               10               11               12               13               14               15                 16               17    2.1   4 mg   See details      18      4 mg         19      4 mg         20      6 mg         21      4 mg         22      4 mg           23      4 mg         24      4 mg         25      4 mg         26  4 mg         27      6 mg         28      4 mg           Date Details   02/17 Last INR check     INR: 2.1                Warfarin Therapy Instructions   March 2025 Details    Sun Mon Tue Wed Thu Fri Sat           1      4 mg           2      4 mg         3   1.8   4 mg   See details      4      4 mg         5      4 mg         6      6 mg         7      4 mg         8      4 mg           9      4 mg         10      4 mg         11      4 mg         12      4 mg         13      6 mg         14      4 mg         15      4 mg           16      4 mg         17      4 mg         18               19               20               21                22                 23               24               25               26               27               28               29                 30                31  Date Details   03/03 This INR check     INR: 1.8       Date of next INR:  01/03/2024

## 2023-12-22 ENCOUNTER — Encounter (HOSPITAL_BASED_OUTPATIENT_CLINIC_OR_DEPARTMENT_OTHER): Payer: Self-pay | Admitting: Family

## 2023-12-22 ENCOUNTER — Ambulatory Visit: Payer: Medicare Other | Attending: Family | Admitting: Family

## 2023-12-22 ENCOUNTER — Other Ambulatory Visit: Payer: Self-pay

## 2023-12-22 VITALS — BP 130/78 | HR 101 | Temp 97.6°F | Ht 65.5 in | Wt 219.8 lb

## 2023-12-22 DIAGNOSIS — Z87891 Personal history of nicotine dependence: Secondary | ICD-10-CM

## 2023-12-22 DIAGNOSIS — R110 Nausea: Secondary | ICD-10-CM | POA: Insufficient documentation

## 2023-12-22 DIAGNOSIS — I10 Essential (primary) hypertension: Secondary | ICD-10-CM | POA: Insufficient documentation

## 2023-12-22 DIAGNOSIS — M65352 Trigger finger, left little finger: Secondary | ICD-10-CM | POA: Insufficient documentation

## 2023-12-22 DIAGNOSIS — R197 Diarrhea, unspecified: Secondary | ICD-10-CM | POA: Insufficient documentation

## 2023-12-22 MED ORDER — ONDANSETRON 4 MG DISINTEGRATING TABLET
4.0000 mg | ORAL_TABLET | ORAL | Status: AC
Start: 2023-12-22 — End: 2023-12-22
  Administered 2023-12-22: 4 mg via ORAL

## 2023-12-22 MED ORDER — ONDANSETRON 4 MG DISINTEGRATING TABLET
2.0000 mg | ORAL_TABLET | ORAL | Status: DC
Start: 2023-12-22 — End: 2023-12-22

## 2023-12-22 MED ORDER — ONDANSETRON 4 MG DISINTEGRATING TABLET
4.0000 mg | ORAL_TABLET | Freq: Three times a day (TID) | ORAL | 0 refills | Status: AC | PRN
Start: 2023-12-22 — End: ?

## 2023-12-22 MED ORDER — HYDROCHLOROTHIAZIDE 25 MG TABLET
25.0000 mg | ORAL_TABLET | Freq: Every day | ORAL | 4 refills | Status: AC
Start: 2023-12-22 — End: ?

## 2023-12-22 NOTE — Progress Notes (Signed)
 [facility]: Family Medicine Office Visit  Progress Note    Subjective:   [name] is a 68 y.o. female with hypertension.  Hypertension treatment hydrochlorothiazide 25 mg.  Pertinent history:taking medications as instructed, no medication side effects noted, no TIAs, no chest pain on exertion, no dyspnea on exertion, no swelling of ankles    She also scheduled today's appointment as a check up before she moves to [place] at the beginning of April. She plans to keep her Medicare visit scheduled in May with me.    Other acute concerns:   She is also having diarrhea, abdominal cramping, and nausea that started today. Her daughter was having diarrhea and nausea a few days ago that resolved after one day. Denies fever, chills, body aches, vomiting, watery diarrhea, blood in stool, or recent antibiotic use. She has tried Tums and pepto bismol with some relief.    She is also having trigger finger of left little finger. She currently has a splint on it.    Lab Results   Component Value Date    SODIUM 137 02/04/2023    POTASSIUM 4.0 02/04/2023    CHLORIDE 100 02/04/2023    CO2 30 02/04/2023    ANIONGAP 7 02/04/2023    BUN 18 02/04/2023    CREATININE 0.84 02/04/2023    BUNCRRATIO 21 02/04/2023    GFR 76 02/04/2023    GLUCOSENF 87 01/25/2019    CHOLESTEROL 175 02/04/2023    HDLCHOL 70 02/04/2023    LDLCHOL 92 02/04/2023    TRIG 68 02/04/2023       Medication list:  ascorbic acid, vitamin C, (VITAMIN C) 500 mg Oral Tablet, Take 0.5 Tablets (250 mg total) by mouth Once a day  aspirin 81 mg Oral Tablet, Chewable, Take 1 Tablet (81 mg total) by mouth Once a day  Bifidobacterium infantis (ALIGN ORAL), Take by mouth (Patient not taking: Reported on 10/28/2023)  Cholecalciferol, Vitamin D3, 50 mcg (2,000 unit) Oral Capsule, Take 2,000 Units by mouth Once a day   clobetasoL (CORMAX) 0.05 % Solution, Apply topically Twice daily  clobetasoL (TEMOVATE) 0.05 % Cream, Apply topically Twice daily  cyanocobalamin  (VITAMIN B-12) 500 mcg Oral Tablet, Take 1 Tablet (500 mcg total) by mouth Once a day  loratadine (CLARITIN) 10 mg Oral Tablet, Take 1 Tablet (10 mg total) by mouth Once a day  olopatadine (PATADAY ONCE DAILY RELIEF) 0.2 % Ophthalmic Drops, Instill 1 Drop into both eyes Once a day  triamcinolone acetonide 0.1 % Ointment, APPLY TOPICALLY TWICE PER DAY AS NEEDED TO RASH ON BODY. USE 2-4 WEEKS, THEN BREAK.  warfarin (COUMADIN) 2 mg Oral Tablet, Take 3 Tablets (6 mg total) by mouth Every evening Or as directed by Anticoag clinic  clindamycin (CLEOCIN) 300 mg Oral Capsule, TAKE 2 CAPSULES (600 MG) BY MOUTH ONE HALF HOUR TO AN HOUR BEFORE DENTAL TREATMENT (Patient not taking: Reported on 10/28/2023)  clindamycin (CLEOCIN) 300 mg Oral Capsule, 2 capsules (600 mg) by mouth one half hour to an hour before dental treatment (Patient not taking: Reported on 12/15/2023)  Fluocinolone-Shower Cap 0.01 % Oil, Apply to scalp 1-2 times weekly at night.  hydroCHLOROthiazide (HYDRODIURIL) 25 mg Oral Tablet, Take 1 Tablet (25 mg total) by mouth Once a day  mupirocin (BACTROBAN) 2 % Ointment, Apply topically Three times a day to wound of left lower leg (Patient not taking: Reported on 12/15/2023)  ofloxacin (OCUFLOX) 0.3 % Ophthalmic Drops Ophthalmic Solution, Administer 1 Drop into the left eye Four  times a day (Patient not taking: Reported on 12/15/2023)  ofloxacin (OCUFLOX) 0.3 % Ophthalmic Drops Ophthalmic Solution, Administer 1 Drop into the right eye Four times a day (Patient not taking: Reported on 12/15/2023)  prednisoLONE acetate (PRED FORTE) 1 % Ophthalmic Drops, Suspension, Administer 1 Drop into the left eye Four times a day (Patient not taking: Reported on 12/15/2023)  prednisoLONE acetate (PRED FORTE) 1 % Ophthalmic Drops, Suspension, For use in surgical suite and 4 times daily into right eye as directed (Patient not taking: Reported on 12/15/2023)  prednisoLONE acetate (PRED FORTE) 1 % Ophthalmic Drops, Suspension, Administer 1  Drop into the right eye Four times a day (Patient not taking: Reported on 12/15/2023)  triamcinolone acetonide (ARISTOCORT A) 0.1 % Cream, Apply topically Twice daily    No facility-administered medications prior to visit.     Review of Systems - as per HPI.   Review of Systems   Constitutional:  Negative for chills, fatigue and fever.   Eyes:  Negative for photophobia and visual disturbance.   Respiratory:  Negative for cough, chest tightness, shortness of breath and wheezing.    Cardiovascular:  Negative for chest pain, palpitations and leg swelling.   Gastrointestinal:  Positive for abdominal pain, diarrhea and nausea. Negative for abdominal distention, blood in stool, constipation and vomiting.   Neurological:  Negative for dizziness, weakness, light-headedness, numbness and headaches.        OBJECTIVE:  BP 130/78   Pulse (!) 101   Temp 36.4 C (97.6 F)   Ht 1.664 m (5' 5.5")   Wt 99.7 kg (219 lb 12.8 oz)   SpO2 97%   BMI 36.02 kg/m      Body mass index is 36.02 kg/m.  BP Readings from Last 3 Encounters:   12/22/23 130/78   12/15/23 136/76   08/05/23 (!) 151/96     Wt Readings from Last 3 Encounters:   12/22/23 99.7 kg (219 lb 12.8 oz)   12/15/23 99.9 kg (220 lb 3.8 oz)   10/28/23 102 kg (225 lb 1.4 oz)     General appearance: in no apparent distress  General exam BP noted to be well controlled today in office, S1, S2 normal, no gallop, no murmur, chest clear, no JVD, no HSM, no edema.     ASSESSMENT:  PLAN:    ICD-10-CM    1. Essential hypertension  I10 hydroCHLOROthiazide (HYDRODIURIL) 25 mg Oral Tablet      2. Nausea  R11.0 ondansetron (ZOFRAN ODT) 4 mg Oral Tablet, Rapid Dissolve     ondansetron (ZOFRAN ODT) rapid dissolve tablet     DISCONTINUED: ondansetron (ZOFRAN ODT) rapid dissolve tablet      3. Diarrhea, unspecified type  R19.7       4. Trigger little finger of left hand  M65.352 Referral to ORTHOPAEDIC - [place] locations        Hypertension  BP well controlled. Continue hydrochlorothiazide 25  mg daily without changes.    Nausea, diarrhea  Zofran given in clinic for nausea, also given for at home. Advised to try a probiotic such as Align, Florastor, or Culturelle for diarrhea and abdominal cramping. She likely contracted Norovirus and should resolve on its own within a few days.    Trigger finger of left little finger  Refer to orthopedics    Return if symptoms worsen or fail to improve.    Orion Crook, STUDENT NURSE PRACTITIONER    I saw and examined the patient.  I reviewed the APP Student's  note.  I agree with the findings and plan of care as documented in the APP Student's note.  Any exceptions/additions are edited/noted.    Hollice Gong, APRN,FNP-BC    FAMILY MEDICINE, [facility]  Operated by [facility]  [address]  [place] [address]  Dept: [phone]  Dept Fax: [phone]

## 2023-12-23 ENCOUNTER — Encounter (HOSPITAL_BASED_OUTPATIENT_CLINIC_OR_DEPARTMENT_OTHER): Payer: Self-pay | Admitting: Orthopaedic Surgery

## 2023-12-27 ENCOUNTER — Encounter (HOSPITAL_BASED_OUTPATIENT_CLINIC_OR_DEPARTMENT_OTHER): Payer: Self-pay

## 2023-12-28 ENCOUNTER — Encounter (INDEPENDENT_AMBULATORY_CARE_PROVIDER_SITE_OTHER): Payer: Self-pay

## 2023-12-28 ENCOUNTER — Encounter (HOSPITAL_BASED_OUTPATIENT_CLINIC_OR_DEPARTMENT_OTHER): Payer: Self-pay

## 2023-12-29 ENCOUNTER — Other Ambulatory Visit: Payer: Self-pay

## 2023-12-29 ENCOUNTER — Ambulatory Visit (HOSPITAL_BASED_OUTPATIENT_CLINIC_OR_DEPARTMENT_OTHER): Payer: Self-pay | Admitting: Physician Assistant

## 2023-12-29 ENCOUNTER — Ambulatory Visit: Payer: Self-pay | Attending: Physician Assistant

## 2023-12-29 ENCOUNTER — Ambulatory Visit (HOSPITAL_BASED_OUTPATIENT_CLINIC_OR_DEPARTMENT_OTHER): Payer: Self-pay

## 2023-12-29 ENCOUNTER — Ambulatory Visit (HOSPITAL_BASED_OUTPATIENT_CLINIC_OR_DEPARTMENT_OTHER)
Admission: RE | Admit: 2023-12-29 | Discharge: 2023-12-29 | Disposition: A | Discharge status: Home / Self Care | Source: Ambulatory Visit | Admitting: Radiology

## 2023-12-29 VITALS — Temp 96.8°F | Ht 64.57 in | Wt 218.3 lb

## 2023-12-29 DIAGNOSIS — Z471 Aftercare following joint replacement surgery: Secondary | ICD-10-CM

## 2023-12-29 DIAGNOSIS — Z96643 Presence of artificial hip joint, bilateral: Secondary | ICD-10-CM

## 2023-12-29 DIAGNOSIS — Z96641 Presence of right artificial hip joint: Secondary | ICD-10-CM

## 2023-12-29 DIAGNOSIS — Z5189 Encounter for other specified aftercare: Secondary | ICD-10-CM | POA: Insufficient documentation

## 2023-12-29 NOTE — Nursing Note (Signed)
 Patient presented to clinic for suture removal on the right forearm. Surgical site has redness and tenderness that concerned the patient. Dr. Gibson Ramp came in to assess the site (see Dr. Gibson Ramp note for assessment). Okay to remove sutures at this time. Area was cleaned with alcohol and sutures were removed with no complaint or complication. Vaseline, Telfa and coban applied to the area. Patient is to send in a picture via MyChart on Friday for further evaluation. Patient is to call clinic with any further questions or concerns. Patient voiced understanding.  Ernst Bowler, Ambulatory Care Assistant, 12/29/2023 11:07

## 2023-12-29 NOTE — Progress Notes (Signed)
 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  Lesion is slightly red and tender at the proximal aspect. Sutures still in place.  Also wants to discuss rash which is present on exam.    O: psoriasiform papules and plaques diffusely on body  Suture site reaction without purulence or induration on right forearm    A/P: 1) wound check - suture site reaction  - remove sutures  - patient to send updated picture in 48 hours  - suspect erythema to improve with removal of sutures  - start ABX in 48 hours if no improvement    2) Psoriasis - 30% BSA  - Patient is moving and prefers to establish care in [place] after moving  - Declines treatment at this time    Rudene Christians, MD

## 2023-12-29 NOTE — Progress Notes (Unsigned)
 12/30/2023     CHIEF COMPLAINT: Follow-up    SUBJECTIVE:   This is a very pleasant 68 y.o. year old female with a past medical history significant for severe AS s/p mechanical AVR On-X 21mm, mild MR, aortic dilation, former tobacco dependence.  She presents for follow-up today.  She tells me today she has been doing very well since her last evaluation.  She states that she saw dermatology recently after recurrence of her rash.  It turns out this was not associated with her beta-blocker therapy.  She has not experienced any significant palpitations or rapid heartbeats to speak of.  She additionally denies any congestive symptoms today.  She has had no change in her functional abilities as result of cardiac symptoms.  She will be moving to the [place] region in about 4 weeks or so and will reestablish with Cardiology down there.  We will continue to monitor her INR until she reestablishes care.  She otherwise does not require medication refills at the present time.  On presentation she denies chest pain, shortness of breath, dizziness, lightheadedness, syncope or near syncope, PND, orthopnea, or lower extremity edema.  She expresses no further cardiovascular concerns or complaints at present time.      Last Transthoracic Echocardiogram (08/20/23) revealed:   Findings  Left Ventricle:   Normal left ventricular size. Moderate left ventricular hypertrophy. Concentric remodeling. The left ventricular ejection fraction by visual assessment is estimated to be 60-65%.  Left ventricular systolic function is normal. No segmental/regional wall motion abnormalities identified. Grade II Diastolic dysfunction present with increased left atrial pressure. The global  longitudinal strain was calculated at -17.2%.  Right Ventricle:   Normal right ventricular size. Normal right ventricular systolic function. Right ventricular systolic pressure is indeterminate.  Left Atrium:   Mildly dilated left atrium.  Right Atrium:    The right atrium is of normal size.  Mitral Valve:   Moderate mitral annular calcification. No evidence of mitral stenosis. No significant mitral regurgitation present.  Tricuspid Valve:   The tricuspid valve is normal. Trace tricuspid regurgitation present.  Aortic Valve:   AVR, 21mm On-X, mechanical valve in the aortic position. The aortic valve prosthesis appears well seated. The aortic prosthesis demonstrates a normal transvalvular gradient for valve type and size. The mean gradient across the aortic valve prosthesis is 10.9 mmHg. Physiological prosthetic regurgitation (washing jets) are noted.  Pulmonic Valve:   The pulmonic valve is not well visualized. No significant pulmonic valve regurgitation present.  IVC/Hepatic Veins:   Normal IVC size with <50% inspiratory collapse (estimated RA pressure: 8 mmHg).  Aorta:   The aortic root is of normal size. The ascending aorta is normal in size.  Pericardium/Pleural space:   Normal pericardium with no pericardial effusion.    Last Cardiac Catheterization (12/06/18) revealed:   IMPRESSION:  1. Normal epicardial coronary arteries.  2. Codominant coronary circulation.      CURRENT MEDICATIONS:  Current Outpatient Medications   Medication Sig    acetaminophen (TYLENOL) 325 mg Oral Tablet Take 1 Tablet (325 mg total) by mouth Every 6 hours as needed for Pain    ascorbic acid, vitamin C, (VITAMIN C) 500 mg Oral Tablet Take 0.5 Tablets (250 mg total) by mouth Once a day    aspirin 81 mg Oral Tablet, Chewable Take 1 Tablet (81 mg total) by mouth Once a day    Bifidobacterium infantis (ALIGN ORAL) Take by mouth    Cholecalciferol, Vitamin D3, 50 mcg (2,000 unit) Oral  Capsule Take 2,000 Units by mouth Once a day     clobetasoL (CORMAX) 0.05 % Solution Apply topically Twice daily    clobetasoL (TEMOVATE) 0.05 % Cream Apply topically Twice daily    cyanocobalamin (VITAMIN B-12) 500 mcg Oral Tablet Take 1 Tablet (500 mcg total) by mouth Once a day    hydroCHLOROthiazide  (HYDRODIURIL) 25 mg Oral Tablet Take 1 Tablet (25 mg total) by mouth Once a day    loratadine (CLARITIN) 10 mg Oral Tablet Take 1 Tablet (10 mg total) by mouth Once a day    olopatadine (PATADAY ONCE DAILY RELIEF) 0.2 % Ophthalmic Drops Instill 1 Drop into both eyes Once a day    ondansetron (ZOFRAN ODT) 4 mg Oral Tablet, Rapid Dissolve Take 1 Tablet (4 mg total) by mouth Every 8 hours as needed for Nausea/Vomiting    triamcinolone acetonide 0.1 % Ointment APPLY TOPICALLY TWICE PER DAY AS NEEDED TO RASH ON BODY. USE 2-4 WEEKS, THEN BREAK.    warfarin (COUMADIN) 2 mg Oral Tablet Take 3 Tablets (6 mg total) by mouth Every evening Or as directed by Anticoag clinic       REVIEW OF SYSTEMS: Other than ROS in the HPI, all other systems were negative.  ROS   VITAL SIGNS:  BP 131/89 (Site: Left Arm, Patient Position: Sitting)   Pulse 78   Temp 36.2 C (97.1 F) (Temporal)   Ht 1.664 m (5' 5.5")   Wt 99.3 kg (218 lb 14.7 oz)   SpO2 98%   BMI 35.88 kg/m       PHYSICAL EXAM:  General: No acute distress and appears stated age.    HENT:ENT without erythema or injection, mucouse membranes moist.    Neck: No JVD, no carotid bruit. and supple, symmetrical, trachea midline.   Lungs: Clear to auscultation bilaterally.    Cardiovascular: Regular rate and rhythm with 1/6 systolic ejection murmur aortic position with mechanical click and vascular pulses are 2+ throughout.    Abdomen: Soft, non-tender and bowel sounds normal.    Extremities: Extremities normal, atraumatic, no cyanosis or edema.    Skin: Skin warm and dry.    Neurologic: Alert and oriented x3.    Lab Results   Component Value Date    CHOLESTEROL 175 02/04/2023    HDLCHOL 70 02/04/2023    LDLCHOL 92 02/04/2023    TRIG 68 02/04/2023        Lab Results   Component Value Date    HA1C 5.2 11/15/2020       ASSESSMENT & PLAN:    1.  AS s/p mechanical AVR 21mm On-X   2.  HTN  3.  Chronic Anticoagulation  4.  Psoriatic rash  5.  Diastolic Dysfunction    This is a very  pleasant 68 year old female presents to cardiology clinic today for annual follow-up.  Her history is as outlined above. She is currently clinically stable and asymptomatic.  She denies any angina or anginal equivalent symptoms nor any congestive symptoms today.  Her blood pressure at home is fairly consistent with in office readings today, mostly in the 130's over 80's.  We will continue her current dose of hydrochlorothiazide with respect to this.  Her most recent echo revealed grade 2 diastolic dysfunction, as noted above.  She is not currently experiencing symptoms associated with this.  She will continue on warfarin therapy with INR monitoring.  We will continue to provide guidance with respect to this until she reestablishes care in [facility]  in the near future.  She otherwise does not require further cardiovascular testing at the present time.  I have encouraged her to reach out with new or concerning symptoms.    Follow-up:  The patient will follow-up in the Cardiology Clinic on an as-needed basis moving forward, as the [place] is [place] of [place].    The patient was seen independently with supervising physician not present but available for consultation.    Kermit Balo. Jah Alarid MSN, APRN, FNP-C  Cardiology Nurse Practitioner  [facility]

## 2023-12-29 NOTE — Progress Notes (Cosign Needed)
 [facility]  [address]  [place] [address]      SUBJECTIVE  [name] is a 68 y.o. female who returns to clinic today post operatively from a right total hip arthroplasty which was performed on 03/13/21 and L THA done in 2017.  The patient is doing well post operatively. The patient has no pain. She is trying to walk more. She is moving to [place].     REVIEW OF SYSTEMS  General: negative for fevers or night sweats  Cardiovascular: negative for chest pain  Respiratory: negative for shortness of breath  Gastrointestinal: negative for digestive problems  Genitourinary: negative for urinary problems  Musculoskeletal: negative for hip pain, negative knee pain     All other systems reviewed and negative    PHYSICAL EXAMINATION   Temp 36 C (96.8 F)   Ht 1.64 m (5' 4.57")   Wt 99 kg (218 lb 4.1 oz)   BMI 36.81 kg/m     GEN:   NAD  EYES: Sclera white.  PULM:   Normal respiratory effort.    MS:  bilateral hips have smooth hip rotation.   NEURO:   Alert and oriented to person, place and time.    PSYCHOSOCIAL:   Pleasant.  Normal affect.    WOUND/INCISION:   No open wounds present.     RADIOGRAPHS  Radiographs were performed today and reviewed at this visit.    X-Ray of bilateral hips was interpreted in clinic to show well positioned and aligned implants. No evidence of loosening, stem subsidence, fracture or dislocation.     ASSESSMENT     ICD-10-CM    1. Status post total replacement of right hip  Z96.641 XR HIPS BILATERAL-JOINT CENTER      2. History of bilateral hip replacements  Z96.643           PLAN  [name] is approximately 3 and 7 years s/p right total hip arthroplasty. she is doing well post operatively and does not have any complaints at the present time. The patient is satisfied with the operation. She is moving to [facility]. We recommend she follow up with Dr. Molly Maduro every 3-5 years for follow up and xrays.     The patient was  told that she will have a follow up visit scheduled in the Joint Clinic prn. The patient was instructed to call with any new or concerning symptoms. All of the patient's questions were answered and she agreed to this plan.    A shared visit was performed with the co-signing physician.  Candyce Churn Journee Bobrowski, PA-C 12/29/2023, 10:15     Cc:   Hollice Gong, APRN,FNP-BC  [address]  [place] [address]

## 2023-12-29 NOTE — Addendum Note (Signed)
 Addended by: Ernst Bowler on: 12/29/2023 11:08 AM     Modules accepted: Level of Service

## 2023-12-30 ENCOUNTER — Encounter (HOSPITAL_BASED_OUTPATIENT_CLINIC_OR_DEPARTMENT_OTHER): Payer: Self-pay | Admitting: Family

## 2023-12-30 ENCOUNTER — Encounter (HOSPITAL_COMMUNITY): Payer: Self-pay | Admitting: Nurse Practitioner

## 2023-12-30 ENCOUNTER — Ambulatory Visit: Payer: Self-pay | Attending: Nurse Practitioner | Admitting: Nurse Practitioner

## 2023-12-30 VITALS — BP 131/89 | HR 78 | Temp 97.1°F | Ht 65.5 in | Wt 218.9 lb

## 2023-12-30 DIAGNOSIS — I9789 Other postprocedural complications and disorders of the circulatory system, not elsewhere classified: Secondary | ICD-10-CM | POA: Insufficient documentation

## 2023-12-30 DIAGNOSIS — I10 Essential (primary) hypertension: Secondary | ICD-10-CM | POA: Insufficient documentation

## 2023-12-30 DIAGNOSIS — Z13220 Encounter for screening for lipoid disorders: Secondary | ICD-10-CM

## 2023-12-30 DIAGNOSIS — Z952 Presence of prosthetic heart valve: Secondary | ICD-10-CM | POA: Insufficient documentation

## 2023-12-30 DIAGNOSIS — I350 Nonrheumatic aortic (valve) stenosis: Secondary | ICD-10-CM | POA: Insufficient documentation

## 2023-12-30 DIAGNOSIS — I5189 Other ill-defined heart diseases: Secondary | ICD-10-CM | POA: Insufficient documentation

## 2023-12-30 DIAGNOSIS — I4891 Unspecified atrial fibrillation: Secondary | ICD-10-CM | POA: Insufficient documentation

## 2023-12-30 NOTE — Telephone Encounter (Signed)
 Labs pended per pt request    Eddie North, RN

## 2023-12-31 ENCOUNTER — Other Ambulatory Visit: Payer: Self-pay

## 2023-12-31 ENCOUNTER — Ambulatory Visit (HOSPITAL_BASED_OUTPATIENT_CLINIC_OR_DEPARTMENT_OTHER): Admitting: Gynecology

## 2024-01-01 ENCOUNTER — Encounter (HOSPITAL_BASED_OUTPATIENT_CLINIC_OR_DEPARTMENT_OTHER): Payer: Self-pay | Admitting: Dermatology

## 2024-01-03 ENCOUNTER — Encounter (HOSPITAL_COMMUNITY): Payer: Self-pay

## 2024-01-03 ENCOUNTER — Ambulatory Visit (HOSPITAL_COMMUNITY): Payer: Self-pay

## 2024-01-03 LAB — PT/INR: INR: 2.2

## 2024-01-03 NOTE — Progress Notes (Signed)
 Anticoagulation Encounter Note:    [facility]    Surgical AVR plan   Warfarin start date: 01-06-2019   [facility] Staff: [facility]  DX : On-X AVR   INR Range: 1.5 - 2.0  Tab strength: warfarin 2mg    Contact 1: [phone] cell   Contact 2:   Lab: POC/[facility] - [facility]   Comments: Tylenol regularly    [facility] anticoag SCN: [name] RN ext [phone]    INR: 2.2  Current Dose: 6 mg Thursdays, 4 mg all other days  Clinical Outcomes       Positives:  INR Above Goal          Patient Findings       Negatives:  Change in health, Change in medications, Change in diet/appetite          Plan: 4 mg daily; recheck in two weeks. Confirmed dosing and testing with patient via MyChart message.    Next INR Date: 3.31.25    [name], RN SCN  Anticoag clinic  Ext. [phone]         For additional information, see dosing calendar below.      Warfarin Therapy Instructions   March 2025 Details    Sun Mon Tue Wed Thu Fri Sat           1                 2               3    1.8   4 mg   See details      4      4 mg         5      4 mg         6      6 mg         7      4 mg         8      4 mg           9      4 mg         10      4 mg         11      4 mg         12      4 mg         13      6 mg         14      4 mg         15      4 mg           16      4 mg         17   2.2   4 mg   See details      18      4 mg         19      4 mg         20      4 mg         21      4 mg         22      4 mg           23      4 mg  24      4 mg         25      4 mg         26      4 mg         27      4 mg         28      4 mg         29      4 mg           30      4 mg         31      4 mg               Date Details   03/03 Last INR check     INR: 1.8      03/17 This INR check     INR: 2.2       Date of next INR:  01/17/2024

## 2024-01-04 ENCOUNTER — Ambulatory Visit (HOSPITAL_BASED_OUTPATIENT_CLINIC_OR_DEPARTMENT_OTHER): Admitting: Rehabilitative and Restorative Service Providers"

## 2024-01-04 ENCOUNTER — Other Ambulatory Visit: Payer: Self-pay

## 2024-01-04 ENCOUNTER — Ambulatory Visit: Admitting: Gynecology

## 2024-01-04 ENCOUNTER — Encounter (HOSPITAL_BASED_OUTPATIENT_CLINIC_OR_DEPARTMENT_OTHER): Payer: Self-pay | Admitting: Physician Assistant

## 2024-01-04 ENCOUNTER — Ambulatory Visit: Payer: Self-pay | Attending: Physician Assistant | Admitting: Physician Assistant

## 2024-01-04 VITALS — Temp 96.8°F | Ht 65.32 in | Wt 223.5 lb

## 2024-01-04 DIAGNOSIS — Z13220 Encounter for screening for lipoid disorders: Secondary | ICD-10-CM | POA: Insufficient documentation

## 2024-01-04 DIAGNOSIS — M20012 Mallet finger of left finger(s): Secondary | ICD-10-CM | POA: Insufficient documentation

## 2024-01-04 DIAGNOSIS — I10 Essential (primary) hypertension: Secondary | ICD-10-CM | POA: Insufficient documentation

## 2024-01-04 DIAGNOSIS — M65352 Trigger finger, left little finger: Secondary | ICD-10-CM | POA: Insufficient documentation

## 2024-01-04 LAB — BASIC METABOLIC PANEL, FASTING
ANION GAP: 7 mmol/L (ref 4–13)
BUN/CREA RATIO: 18 (ref 6–22)
BUN: 13 mg/dL (ref 8–25)
CALCIUM: 8.7 mg/dL (ref 8.6–10.3)
CHLORIDE: 104 mmol/L (ref 96–111)
CO2 TOTAL: 24 mmol/L (ref 23–31)
CREATININE: 0.74 mg/dL (ref 0.60–1.05)
ESTIMATED GFR - FEMALE: 88 mL/min/BSA (ref >=60)
GLUCOSE: 87 mg/dL (ref 70–99)
POTASSIUM: 3.9 mmol/L (ref 3.5–5.1)
SODIUM: 135 mmol/L — ABNORMAL LOW (ref 136–145)

## 2024-01-04 LAB — LIPID PANEL
CHOL/HDL RATIO: 2.6
CHOLESTEROL: 167 mg/dL (ref 100–200)
HDL CHOL: 65 mg/dL (ref >=50)
LDL CALC: 91 mg/dL (ref <100)
NON-HDL: 102 mg/dL (ref <=190)
TRIGLYCERIDES: 53 mg/dL (ref <150)
VLDL CALC: 8 mg/dL (ref <30)

## 2024-01-04 NOTE — H&P (Unsigned)
 PATIENT NAME: [name], [name]   HOSPITAL NUMBER:  [id number]  DATE OF SERVICE: 01/04/2024  DATE OF BIRTH:  1955-12-11    HISTORY AND PHYSICAL    CHIEF COMPLAINT:  Left small finger injury.    DATE OF INJURY:  October 22, 2023    HISTORY OF PRESENT ILLNESS:  Haiden Clucas is a 68 year old female, new patient, presenting to the clinic today with a chief complaint of left small finger injury.  She says that on October 22, 2023, she was getting her bag out from under an airplane seat when she jammed the tip of the left small finger.  She noted that she had drooping of the finger so she presented to an urgent care where they took x-rays which were normal.  The patient assumed that she had a trigger finger and was not seen for it.  She then saw a friend who is a physical therapist a couple weeks ago who told her that it was likely a mallet finger.  The patient read online that she should be splinting the fingers, so she bought an Alumafoam splint online.  Today, the patient says that she thinks the finger is improving since she has been splinting it.  She has been splinting the finger all the time other than hygiene and when she puts in hair products she has been bending the finger some.    PAST MEDICAL HISTORY:  Abdominal hernia, anemia, aortic stenosis, arthritis, atrial fibrillation, lower back pain, blood thinner use, skin cancer, chronic pain, colon polyps, dysrhythmias, heart murmur, high blood pressure, motion sickness, obesity, psoriasis, valvular disease.    PAST SURGICAL HISTORY:  Left cataract extraction, colonoscopy, aortic valve replacement, right cataract removal, C-section, heart catheterization x2, abdominal hernia repair x3, bilateral hip replacement, hysterectomy, oophorectomy, skin cancer excision.    MEDICATIONS:  1. Acetaminophen.  2. Ascorbic aspirate.  3. Aspirin.  4. ***.    5. Cholecalciferol.  6. Clobetasol.  7. Cyanocobalamin.  8. Hydrochlorothiazide.  9. Loratadine.  10. Olopatadine.  11. Ondansetron.    12. Triamcinolone.    13. Warfarin.    ALLERGIES:  1. IV CONTRAST with reaction of anaphylaxis.    2. OREGANO with reaction of hives.  3. STADOL with reaction of mental status effect.  4. TOMATOES with reaction of hives.    SOCIAL HISTORY:  The patient is retired.  Denies any tobacco or illicit drug use.  Drinks 1-2 alcoholic beverages per week.    FAMILY HISTORY:  Denies any history of blood clots, bleeding problems or problems with anesthesia.    REVIEW OF SYSTEMS:  Per patient assessment positive for numbness and tingling in the feet.    PHYSICAL EXAMINATION:  Constitutional:  Awake, alert, in no acute distress.  Psych:  Normal affect.  The patient is pleasant and cooperative.  Vitals:  Temperature 36 degrees Celsius, height 1.659 m, weight 101 kg.    Head:  Normocephalic, atraumatic.  Cardiac:  2+ regular peripheral pulses.  Brisk capillary refill.  Respiratory:  Symmetric nonlabored breathing.  No acute respiratory distress.  Skin:  Exposed skin is dry, and intact.  No rashes or lesions.  Musculoskeletal:  There is extensor lag at the DIP joint of the left small finger.  The joint is supple.  Range of motion is intact at the wrists and the other digits.  Sensation of the hand is intact to light touch.    DATA:  Previous notes and images were reviewed.  Reviewed her previous x-rays of the hand  from an outside facility.    IMAGING:  No new imaging today.    ASSESSMENT:  A 68 year old female with a left small finger mallet finger.    PLAN:  1. We talked to the patient about operative versus nonoperative management for this.  I explained that it would be reasonable to try a course of splinting the finger if this is bothersome to her.  I explained that she will need to wear the splint 24/7 for about 8 weeks and not take it off or bend the finger at all.  Our hand therapist made  her a custom splint.  2. I normally would have the patient follow up in 4 weeks.  However, the patient is moving to [place] permanently in about 3 weeks, so she will follow up on a p.r.n. basis.  She plans to follow up with an orthopedist in [place] at some point.  The patient voiced understanding of this plan.  All questions were answered care.    The patient was seen and evaluated in clinic today with the cosigning physician.        Brock Ra, PA-C                DD:  01/04/2024 14:48:09  DT:  01/04/2024 17:23:18 FP  D#:  [id number]

## 2024-01-05 NOTE — Progress Notes (Signed)
 Medical-Surgical Diagnosis: left small finger mallet finger.   Chief Complaint left hand and digit stiffness, decreased small finger extension    Date of onset/injury: 10/22/2023   Date of surgery:  NA   Start of Care Date: 01/04/2024  Medical Record #: [id number]  Referring Physician: Brock Ra, PA-C  Treating Therapist: Wyn [place]ster Sheriff Rodenberg OTR/L, CHT    Current Pain this visit: 0/10    HISTORY:  [name] is a 69 year old female who reports injuring her finger on 10/22/2023 while jamming her finger on a airplane seat.    Orthosis  The mallet finger protocol requires that the patient is placed in an orthosis to protect the finger while healing. A plaster or fiberglass splint, once removed after the initial application, can not be re-applied. An over-the-counter orthosis did not provide 1) the appropriate alignment for freedom of movement for the uninvolved digits/joints, 2) protection while structures are healing, 3) comfort of fit. Therefore, it was medically necessary to provide the patient with a custom fabricated thermoplastic mallet orthosis which can be reapplied and repeatedly modified as needed throughout the course of care.     Patient was fabricated a custom left small finger orthosis and a custom left small finger gutter orthosis both with DIP extended and PIP free as per the physician's request.     Wearing instructions: The orthosis is to be worn full time.    Orthosis precautions: He was provided with standard orthosis instructions which included the following: 1) purpose of the orthosis; 2) wearing schedule; 3) orthosis care including specific cleaning instructions; 4) avoiding exposing the orthosis to temperatures higher than 140 degrees as this could misshapen the splint.  He was encouraged to contact the therapist in the event of change in the pain, numbness, tingling, redness, blistered or broken skin, if the orthosis is uncomfortable, or no longer fitting  appropriately.        Goals  ______    1.)   Met   Patient will report comfort in orthosis today.     2.)   Met   Patient will verbalize understanding of wearing schedule and care of orthosis and its intended benefit today.     3.)   Met   Pt will be able to don/doff splint independently today.      Assessment: She is currently limited with ADL function due to left small finger stiffness, edema and decreased extension. She is expected to make progress toward increased function with use and compliance with her custom orthosis.       PLAN: One time visit for orthosis. Follow up for orthosis adjustment.

## 2024-01-06 ENCOUNTER — Ambulatory Visit (HOSPITAL_BASED_OUTPATIENT_CLINIC_OR_DEPARTMENT_OTHER): Payer: Self-pay | Admitting: Family

## 2024-01-07 ENCOUNTER — Ambulatory Visit (HOSPITAL_BASED_OUTPATIENT_CLINIC_OR_DEPARTMENT_OTHER): Payer: Self-pay | Admitting: Physician Assistant

## 2024-01-10 ENCOUNTER — Encounter (HOSPITAL_BASED_OUTPATIENT_CLINIC_OR_DEPARTMENT_OTHER): Payer: Self-pay | Admitting: Dermatology

## 2024-01-11 ENCOUNTER — Other Ambulatory Visit: Payer: Self-pay

## 2024-01-11 ENCOUNTER — Ambulatory Visit: Attending: Dermatology

## 2024-01-11 DIAGNOSIS — Z5189 Encounter for other specified aftercare: Secondary | ICD-10-CM | POA: Insufficient documentation

## 2024-01-11 MED ORDER — MUPIROCIN 2 % TOPICAL OINTMENT
TOPICAL_OINTMENT | Freq: Two times a day (BID) | CUTANEOUS | 1 refills | Status: AC
Start: 2024-01-11 — End: 2024-01-25

## 2024-01-11 NOTE — Progress Notes (Signed)
 Pt presents with a wound on the R Forearm excised on 12/15/23.  Dr. Lorrine Rotunda Dr. Merlyn Starring examined wound.  Wound was clean with little bleeding, and a small opening.  Mupirocin  was prescribed.  Vaseline and bandage was applied.  Pt tolerated well.  Pt advised to contact office with any questions or concerns.   Pt verbalized understanding.    Milan Alfred, LPN

## 2024-01-14 ENCOUNTER — Encounter (HOSPITAL_BASED_OUTPATIENT_CLINIC_OR_DEPARTMENT_OTHER): Payer: Self-pay | Admitting: Dermatology

## 2024-01-14 ENCOUNTER — Encounter (HOSPITAL_COMMUNITY): Payer: Self-pay

## 2024-01-17 ENCOUNTER — Encounter (HOSPITAL_COMMUNITY): Payer: Self-pay

## 2024-01-17 ENCOUNTER — Ambulatory Visit (HOSPITAL_COMMUNITY): Payer: Self-pay

## 2024-01-17 LAB — PT/INR: INR: 1.5

## 2024-01-17 NOTE — Progress Notes (Signed)
 Anticoagulation Encounter Note:    [name]     Surgical AVR plan   Warfarin start date: 01-06-2019   [facility] Staff: [name]  DX : On-X AVR   INR Range: 1.5 - 2.0  Tab strength: warfarin 2mg    Contact 1: [phone] cell   Contact 2:   Lab: POC/[place] - [facility]   Comments: Tylenol  regularly     [facility] anticoag SCN: Genette Kent RN ext [phone]     INR: 1.5  Current Dose: 4 mg daily      Clinical Outcomes       Positives:  INR In Range            Plan: 5 mg Thursdays, 4 mg all other days; recheck in two weeks. Confirmed dosing and testing with patient via MyChart message.    Next INR Date: 01/31/2024     [name] Kitchen, RN SCN  Anticoag clinic  Ext. [phone]      For additional information, see dosing calendar below.      Warfarin Therapy Instructions   March 2025 Details    Sun Mon Tue Wed Thu Fri Sat           1                 2               3               4               5               6               7               8                 9               10               11               12               13               14               15                 16               17    2.2   4 mg   See details      18      4 mg         19      4 mg         20      4 mg         21      4 mg         22      4 mg           23      4 mg         24      4 mg         25      4 mg         26  4 mg         27      4 mg         28      4 mg         29      4 mg           30      4 mg         31   1.5   4 mg   See details            Date Details   03/17 Last INR check     INR: 2.2      03/31 This INR check     INR: 1.5                Warfarin Therapy Instructions   April 2025 Details    Sun Mon Tue Wed Thu Fri Sat       1      4 mg         2      4 mg         3      5 mg         4      4 mg         5      4 mg           6      4 mg         7      4 mg         8      4 mg         9      4 mg         10      5 mg         11      4 mg         12      4 mg           13      4 mg         14      4 mg         15               16               17                18               19                 20               21               22               23               24               25               26                 27                28  29               30                   Date Details   No additional details    Date of next INR:  01/31/2024

## 2024-01-18 ENCOUNTER — Ambulatory Visit
Payer: Self-pay | Attending: Rehabilitative and Restorative Service Providers" | Admitting: Rehabilitative and Restorative Service Providers"

## 2024-01-18 ENCOUNTER — Other Ambulatory Visit: Payer: Self-pay

## 2024-01-18 NOTE — Progress Notes (Signed)
 Medical-Surgical Diagnosis: left small finger mallet finger.   Chief Complaint left hand and digit stiffness, decreased small finger extension    Date of onset/injury: 10/22/2023   Date of surgery:  NA   Start of Care Date: 01/04/2024  Medical Record #: [id number]  Referring Physician: Nancey Awkward, PA-C  Treating Therapist: Solmon Dupont [place]an Samford OTR/L, CHT    Current Pain this visit: 0/10    HISTORY:  [name] is a 68 year old female who reports injuring her finger on 10/22/2023 while jamming her finger on a airplane seat.    She reports some discomfort with hearing her current orthosis.     Orthosis:  Patient was re-fabricated a custom left small finger orthosis and a custom left small finger gutter orthosis both with DIP extended and PIP free for increased comfort and positioning as per the physician's request. She was fabricated three different styles.     Wearing instructions: The orthosis is to be worn full time.    Orthosis precautions: He was provided with standard orthosis instructions which included the following: 1) purpose of the orthosis; 2) wearing schedule; 3) orthosis care including specific cleaning instructions; 4) avoiding exposing the orthosis to temperatures higher than 140 degrees as this could misshapen the splint.  He was encouraged to contact the therapist in the event of change in the pain, numbness, tingling, redness, blistered or broken skin, if the orthosis is uncomfortable, or no longer fitting appropriately.        Goals  ______    1.)   Met   Patient will report comfort in orthosis today.     2.)   Met   Patient will verbalize understanding of wearing schedule and care of orthosis and its intended benefit today.     3.)   Met   Pt will be able to don/doff splint independently today.      Assessment: She is currently limited with ADL function due to left small finger stiffness, edema and decreased extension. She is expected to make progress toward increased function with use and  compliance with her custom orthosis.       PLAN: One time visit for orthosis. Follow up for orthosis adjustment.

## 2024-02-01 ENCOUNTER — Encounter (HOSPITAL_COMMUNITY): Payer: Self-pay

## 2024-02-01 ENCOUNTER — Ambulatory Visit (HOSPITAL_COMMUNITY): Payer: Self-pay

## 2024-02-01 LAB — PT/INR: INR: 1.7

## 2024-02-01 NOTE — Progress Notes (Signed)
 [facility] Anticoagulation Encounter Note:     Rankin    Surgical AVR plan   Warfarin start date: 01-06-2019   [facility] Staff: Rankin  DX : On-X AVR   INR Range: 1.5 - 2.0  Tab strength: warfarin 2mg    Contact 1: [phone] cell   Contact 2:   Lab: POC/[facility] - [facility]   Comments: Tylenol regularly    [facility] anticoag SCN: Genette Kent RN ext [phone]     INR: 1.7  Current dosage: 5 mg Thursdays, 4 mg all other days         Clinical Outcomes       Positives:  INR In Range             Plan: 5 mg Thursdays, 4 mg all other days; recheck in 2 weeks. Confirmed dosing and testing with patient via MyChart message.     Next INR Date:  4.28.25    Georgina Kitchen, RN SCN  Anticoag clinic  Ext. [phone]           For additional information, see dosing calendar below.       Warfarin Therapy Instructions   March 2025 Details    Sun [place] Tue Wed Thu Fri Sat           1                 2               3               4               5               6               7               8                 9               10               11               12               13               14               15                 16               17               18               19               20               21               22                 23               24                25  26               27               28               29                 30               31    1.5   4 mg   See details            Date Details   03/31 Last INR check     INR: 1.5                Warfarin Therapy Instructions   April 2025 Details    Sun Mon Tue Wed Thu Fri Sat       1      4 mg         2      4 mg         3      5 mg         4      4 mg         5      4 mg           6      4 mg         7      4 mg         8      4 mg         9      4 mg         10      5 mg         11      4 mg         12      4 mg           13      4 mg         14      4 mg         15   1.7   4 mg   See details      16      4 mg         17      5 mg         18      4 mg         19      4 mg            20      4 mg         21      4 mg         22      4 mg         23      4 mg         24      5 mg         25      4 mg         26      4 mg           27      4 mg         28  4 mg         29               30                   Date Details   04/15 This INR check     INR: 1.7       Date of next INR:  02/14/2024

## 2024-02-03 ENCOUNTER — Ambulatory Visit (HOSPITAL_COMMUNITY): Payer: Self-pay | Admitting: Nurse Practitioner

## 2024-02-15 ENCOUNTER — Encounter (HOSPITAL_COMMUNITY): Payer: Self-pay

## 2024-02-15 ENCOUNTER — Ambulatory Visit (HOSPITAL_COMMUNITY): Payer: Self-pay

## 2024-02-15 LAB — PT/INR: INR: 1.2

## 2024-02-15 NOTE — Progress Notes (Signed)
 [facility] Anticoagulation Encounter Note:     [facility]in    Surgical AVR plan   Warfarin start date: 01-06-2019   [facility] Staff: Rankin  DX : On-X AVR   INR Range: 1.5 - 2.0  Tab strength: warfarin 2mg    Contact 1: [phone] cell   Contact 2:   Lab: POC/[place] - [facility]   Comments: Tylenol  regularly    [facility] anticoag SCN: Genette Kent RN ext [phone]     INR: 1.2  Current dosage: 5 mg Thursday, 4 mg all other days         Clinical Outcomes       Positives:  INR Below Goal (discussed with provider)             Plan: Reviewed possible causes of subtherapeutic INR. Patient stated that she had a bloody Mary over the weekend but no other changes. 6 mg now; recheck tomorrow. Confirmed dosing and testing with patient.    Next INR Date:  4.30.25  Georgina Kitchen, RN SCN  Anticoag clinic  Ext. [phone]          For additional information, see dosing calendar below.       Warfarin Therapy Instructions   April 2025 Details    Sun [place] Tue Wed Thu Fri Sat       1               2               3               4               5                 6               7               8               9               10               11               12                 13               14               15    1.7   4 mg   See details      16      4 mg         17      5 mg         18      4 mg         19      4 mg           20      4 mg         21      4 mg         22      4 mg         23      4 mg         24      5 mg  25      4 mg         26      4 mg           27      4 mg         28      4 mg         29   1.2   6 mg   See details      30      4 mg             Date Details   04/15 Last INR check     INR: 1.7      04/29 This INR check     INR: 1.2       Date of next INR:  02/16/2024

## 2024-02-16 ENCOUNTER — Encounter (HOSPITAL_COMMUNITY): Payer: Self-pay

## 2024-02-16 ENCOUNTER — Ambulatory Visit (HOSPITAL_COMMUNITY): Payer: Self-pay

## 2024-02-16 LAB — PT/INR: INR: 1.4

## 2024-02-16 NOTE — Progress Notes (Signed)
 [facility] Anticoagulation Encounter Note:     [name]    Surgical AVR plan   Warfarin start date: 01-06-2019   [facility] Staff: [name]  DX : On-X AVR   INR Range: 1.5 - 2.0  Tab strength: warfarin 2mg    Contact 1: [phone] cell   Contact 2:   Lab: [facility]/[place] [facility] - [facility]   Comments: Tylenol  regularly    [facility] anticoag SCN: Genette Kent RN ext [phone]     INR: 1.4  Current dosage: 6 mg yesterday         Clinical Outcomes       Positives:  INR Below Goal (improved)             Plan: 5 mg Thursday, 4 mg all other days; recheck in one week. Confirmed dosing and testing with patient via MyChart message.     Next INR Date:  5.7.25  [name] Kitchen, RN SCN  Anticoag clinic  Ext. [phone]          For additional information, see dosing calendar below.       Warfarin Therapy Instructions   April 2025 Details    Sun Mon Tue Wed Thu Fri Sat       1               2               3               4               5                 6               7               8               9               10               11               12                 13               14               15               16               17               18               19                 20               21               22               23               24               25                26  27               28               29    1.2   6 mg   See details      30   1.4   4 mg   See details          Date Details   04/29 Last INR check     INR: 1.2      04/30 This INR check     INR: 1.4                Warfarin Therapy Instructions   May 2025 Details    Sun Mon Tue Wed Thu Fri Sat         1      5 mg         2      4 mg         3      4 mg           4      4 mg         5      4 mg         6      4 mg         7      4 mg         8      5 mg         9      4 mg         10      4 mg           11      4 mg         12      4 mg         13      4 mg         14      4 mg         15               16               17                 18               19               20                21               22               23               24                 25               26               27               28               29                30  31                Date Details   No additional details    Date of next INR:  03/01/2024

## 2024-02-22 ENCOUNTER — Ambulatory Visit (HOSPITAL_COMMUNITY): Payer: Self-pay

## 2024-02-22 ENCOUNTER — Encounter (HOSPITAL_COMMUNITY): Payer: Self-pay

## 2024-02-22 LAB — PT/INR: INR: 1.4

## 2024-02-22 NOTE — Progress Notes (Signed)
 [facility] Anticoagulation Encounter Note:     Rankin    Surgical AVR plan   Warfarin start date: 01-06-2019   [facility] Staff: Rankin  DX : On-X AVR   INR Range: 1.5 - 2.0  Tab strength: warfarin 2mg    Contact 1: [phone] cell   Contact 2:   Lab: POC/[place] - [facility]   Comments: Tylenol  regularly    [facility] anticoag SCN: Genette Kent RN ext [phone]     INR: 1.4  Current dosage: 5 mg Thursday, 4 mg all other days         Clinical Outcomes       Positives:  INR Below Goal             Plan: 5 mg Tuesday/Thursday/Saturday, 4 mg all other days; recheck in one week. Confirmed dosing and testing with patient via MyChart message.   Next INR Date:  5.13.25    Georgina Kitchen, RN SCN  Anticoag clinic  Ext. [phone]          For additional information, see dosing calendar below.       Warfarin Therapy Instructions   April 2025 Details    Sun [place] Tue Wed Thu Fri Sat       1               2               3               4               5                 6               7               8               9               10               11               12                 13               14               15               16               17               18               19                 20               21               22               23               24               25                26  27               28               29               30    1.4   4 mg   See details          Date Details   04/30 Last INR check     INR: 1.4                Warfarin Therapy Instructions   May 2025 Details    Sun Mon Tue Wed Thu Fri Sat         1      5 mg         2      4 mg         3      4 mg           4      4 mg         5      4 mg         6   1.4   5 mg   See details      7      4 mg         8      5 mg         9      4 mg         10      5 mg           11      4 mg         12      4 mg         13      4 mg         14               15               16               17                 18               19               20               21                22               23               24                 25               26               27               28               29               30                31  Date Details   05/06 This INR check     INR: 1.4       Date of next INR:  02/29/2024

## 2024-02-24 ENCOUNTER — Encounter (HOSPITAL_COMMUNITY): Payer: Self-pay

## 2024-02-29 ENCOUNTER — Ambulatory Visit (HOSPITAL_COMMUNITY): Payer: Self-pay

## 2024-02-29 ENCOUNTER — Encounter (HOSPITAL_COMMUNITY): Payer: Self-pay

## 2024-02-29 LAB — PT/INR: INR: 1.8

## 2024-02-29 NOTE — Progress Notes (Signed)
 [facility] Anticoagulation Encounter Note:     [name]    Surgical AVR plan   Warfarin start date: 01-06-2019   [facility] Staff: [name]  DX : On-X AVR   INR Range: 1.5 - 2.0  Tab strength: warfarin 2mg    Contact 1: [phone] cell   Contact 2:   Lab: P[facility] - [facility]   Comments: Tylenol  regularly    [facility] anticoag SCN: Genette Kent RN ext [phone]     INR: 1.8  Current dosage: 5 mg Tu/Th/Sa, 4 mg all other days         Clinical Outcomes       Positives:  INR In Range             Plan: 5 mg Tu/F, 4 mg all other days; recheck in one week. Confirmed dosing and testing with patient via MyChart message.     Next INR Date:  5.27.25    [name] Kitchen, RN SCN  Anticoag clinic  Ext. [phone]           For additional information, see dosing calendar below.       Warfarin Therapy Instructions   May 2025 Details    Sun Mon Tue Wed Thu Fri Sat         1               2               3                 4               5               6    1.4   5 mg   See details      7      4 mg         8      5 mg         9      4 mg         10      5 mg           11      4 mg         12      4 mg         13   1.8   5 mg   See details      14      4 mg         15      4 mg         16      5 mg         17      4 mg           18      4 mg         19      4 mg         20      5 mg         21      4 mg         22      4 mg         23      5 mg         24      4 mg  25      4 mg         26      4 mg         27      4 mg         28               29               30               31                 Date Details   05/06 Last INR check     INR: 1.4      05/13 This INR check     INR: 1.8       Date of next INR:  03/14/2024

## 2024-03-01 ENCOUNTER — Ambulatory Visit (INDEPENDENT_AMBULATORY_CARE_PROVIDER_SITE_OTHER): Payer: Medicare Other | Admitting: Ophthalmology

## 2024-03-02 ENCOUNTER — Encounter: Payer: Self-pay | Admitting: Orthopedic Surgery

## 2024-03-02 ENCOUNTER — Ambulatory Visit (INDEPENDENT_AMBULATORY_CARE_PROVIDER_SITE_OTHER): Payer: Self-pay | Admitting: Orthopedic Surgery

## 2024-03-02 VITALS — BP 150/80 | HR 86 | Temp 96.9°F | Ht 66.0 in | Wt 217.8 lb

## 2024-03-02 DIAGNOSIS — I10 Essential (primary) hypertension: Secondary | ICD-10-CM | POA: Diagnosis not present

## 2024-03-02 DIAGNOSIS — M20012 Mallet finger of left finger(s): Secondary | ICD-10-CM | POA: Diagnosis not present

## 2024-03-02 DIAGNOSIS — I38 Endocarditis, valve unspecified: Secondary | ICD-10-CM

## 2024-03-02 DIAGNOSIS — I5189 Other ill-defined heart diseases: Secondary | ICD-10-CM

## 2024-03-02 DIAGNOSIS — I480 Paroxysmal atrial fibrillation: Secondary | ICD-10-CM

## 2024-03-02 DIAGNOSIS — I350 Nonrheumatic aortic (valve) stenosis: Secondary | ICD-10-CM

## 2024-03-02 DIAGNOSIS — Z720 Tobacco use: Secondary | ICD-10-CM

## 2024-03-02 DIAGNOSIS — Z7901 Long term (current) use of anticoagulants: Secondary | ICD-10-CM

## 2024-03-02 DIAGNOSIS — E669 Obesity, unspecified: Secondary | ICD-10-CM

## 2024-03-02 DIAGNOSIS — M8580 Other specified disorders of bone density and structure, unspecified site: Secondary | ICD-10-CM

## 2024-03-02 NOTE — Patient Instructions (Addendum)
 [facility]> Dr. Annamae Barrett > [phone]  Please take Caltrate for bone health  Plan to have yearly mammogram and bone density 11/2024  Schedule medicare annual wellness visit > video visit ok

## 2024-03-02 NOTE — Progress Notes (Unsigned)
 Careteam: Patient Care Team: Arnetha Bhat, NP as PCP - General (Adult Health Nurse Practitioner)  Seen by: Ulyses Gandy, AGNP-C  PLACE OF SERVICE:  [facility]  Advanced Directive information Does Patient Have a Medical Advance Directive?: Yes, Type of Advance Directive: Healthcare Power of [place];Living will, Does patient want to make changes to medical advance directive?: No - Patient declined  Allergies  Allergen Reactions   Iodinated Contrast Media Anaphylaxis   Iodine Anaphylaxis   Metoprolol Dermatitis   Butorphanol Tartrate Other (See Comments)   Iohexol      Desc: hives- 08/04/05- asm    Latex     Added based on information entered during case entry, please review and add reactions, type, and severity as needed   Origanum Oil     Hives   Tomato Hives    Chief Complaint  Patient presents with   Establish Care    New patient.      HPI: Patient is a 68 y.o. female seen today to establish at [facility].   Discussed the use of AI scribe software for clinical note transcription with the patient, who gave verbal consent to proceed.  History of Present Illness    Previous provider Gonzalo Lather in [place] . Moved back to Socially, she is divorced, has two children, and recently moved back to [place] from [place] . She is retired but looking for part-time work. She quit smoking in 2014 after smoking a pack a day since age 27. She consumes one to two glasses of wine five days a week. She reports an allergy to tomatoes, which causes hives, and has a known allergy to contrast dye and oregano.  She has a history of aortic stenosis, initially discovered during a pre-operative evaluation for hip replacement surgery in 2017. The stenosis progressed from moderate to severe by early 2020, leading to aortic valve replacement surgery on January 04, 2019. Post-operatively, she developed atrial fibrillation and was treated with amiodarone, which caused severe  nausea and vomiting. Her medication was switched to metoprolol, initially at 25 mg twice daily, but due to excessive fatigue, it was changed to metoprolol succinate 50 mg at night. She has since discontinued metoprolol as her cardiologist deemed it unnecessary. She is currently on warfarin and aspirin for anticoagulation. She went ahead and scheduled with cardiology in August 2025.   No history of diabetes, myocardial infarction, or high cholesterol, and her recent cholesterol levels were within normal limits.  She has a history of skin cancer, having undergone Mohs surgery in February 2025 and May 2020. She also has a history of cataract surgery last year and a C-section in 1984. She has had two successful hip replacements.  She sustained a mallet finger injury on October 21, 2023, which has not healed despite treatment with a cast. She is seeking a hand specialist for further evaluation. Denies pain today.   Her family history is significant for atrial fibrillation in her mother, who passed away at 61 from a cardiac event, and lung cancer in her father, who died at 74. She has a sister with mitral valve regurgitation and a brother who died from lung cancer.   In terms of her general health, she reports no recent changes in bowel habits, no blood in stool, and no high cholesterol. She had a colonoscopy in July 2021 with no polyps and is due for another in ten years. Her last mammogram was in February 2025, and she plans to have a bone  density test next year. She takes vitamin D3 and is considering starting Caltrate for bone health.        , moved to [facility] 04/15.   Divorced  2 children> son lives close   Retired. Photography, film/ video busniess.   No MI, T2DM.   + skin cancer  Aortic stenosis- heart murmur noted after hipr replacement surgery 2017> 01/04/2019>   HTN  PAF- developed 3 days after valve replacement      Both hips replaced > anteriot approach      Hip  replacement 2017 C section 1984 Cataracts surgery 2024 Mohs surgery 11/2023> right forearm> face 02/2019  Colonscopy 05/14/2020 > no polyps, no family members with colon cancer Mammogram 11/2023> no malignancy Bone density> 02/28/2020> borderline osteopenia  Smoking- 1 PPD from age 89- until 2014 Alcohol- no past use or abuse, average 1-2 glasses wine per night Illicit drug use- none  Diet: 3 meals daily, allergy to tomatoes> hives, drinks diet pepsi> 5 per week Exercise: no regimen  Eyes: cataracts surgery 02/2023, f/u 08/2023 Dental: teeth cleaning 11/2023  Mother passed at age 69> heart disease, PAF, HTN Father passed at age 70> lung cancer with mets to liver, HTN Sister younger- mitral valve regurgitation Brother passed at 15 from lung cancer, drug abuse  Paternal grandmother passed after heart surgery Paternal grandfather had CVA and passed a few years later    Review of Systems:  ROS***  Past Medical History:  Diagnosis Date   Hypertension    Past Surgical History:  Procedure Laterality Date   TOTAL HIP ARTHROPLASTY Bilateral    Social History:   reports that she has quit smoking. Her smoking use included cigarettes. She does not have any smokeless tobacco history on file. She reports current alcohol use of about 5.0 - 6.0 standard drinks of alcohol per week. She reports that she does not use drugs.  Family History  Problem Relation Age of Onset   Atrial fibrillation Mother    Sudden Cardiac Death Mother    Lung cancer Father     Medications: Patient's Medications  New Prescriptions   No medications on file  Previous Medications   ACETAMINOPHEN (TYLENOL) 325 MG TABLET    Take 325 mg by mouth as needed.   ASCORBIC ACID (VITAMIN C) 500 MG TABLET    Take 250 mg by mouth daily.   ASPIRIN 81 MG CHEWABLE TABLET    Chew 81 mg by mouth daily.   CHOLECALCIFEROL 50 MCG (2000 UT) CAPS    Take 2,000 Units by mouth daily.   CLOBETASOL CREAM (TEMOVATE) 0.05 %     Apply 1 Application topically as needed.   CYANOCOBALAMIN (VITAMIN B12) 500 MCG TABLET    Take 500 mcg by mouth daily.   HYDROCHLOROTHIAZIDE (HYDRODIURIL) 25 MG TABLET    Take 25 mg by mouth daily.   LORATADINE (CLARITIN) 10 MG TABLET    Take 1 tablet by mouth daily.   OLOPATADINE HCL 0.2 % SOLN    Apply 1 drop to eye daily.   ONDANSETRON (ZOFRAN-ODT) 4 MG DISINTEGRATING TABLET    Take 4 mg by mouth as needed.   TRIAMCINOLONE OINTMENT (KENALOG) 0.1 %    Apply 1 Application topically as needed.   WARFARIN (COUMADIN) 2 MG TABLET    Take 4-5 mg by mouth daily.  Modified Medications   No medications on file  Discontinued Medications   No medications on file    Physical Exam:  Vitals:   03/02/24 1003  BP: (!) 150/80  Pulse: 86  Temp: (!) 96.9 F (36.1 C)  SpO2: 95%  Weight: 217 lb 12.8 oz (98.8 kg)  Height: 5\' 6"  (1.676 m)   Body mass index is 35.15 kg/m. Wt Readings from Last 3 Encounters:  03/02/24 217 lb 12.8 oz (98.8 kg)    Physical Exam***  Labs reviewed: Basic Metabolic Panel: No results for input(s): "NA", "K", "CL", "CO2", "GLUCOSE", "BUN", "CREATININE", "CALCIUM", "MG", "PHOS", "TSH" in the last 8760 hours. Liver Function Tests: No results for input(s): "AST", "ALT", "ALKPHOS", "BILITOT", "PROT", "ALBUMIN" in the last 8760 hours. No results for input(s): "LIPASE", "AMYLASE" in the last 8760 hours. No results for input(s): "AMMONIA" in the last 8760 hours. CBC: No results for input(s): "WBC", "NEUTROABS", "HGB", "HCT", "MCV", "PLT" in the last 8760 hours. Lipid Panel: No results for input(s): "CHOL", "HDL", "LDLCALC", "TRIG", "CHOLHDL", "LDLDIRECT" in the last 8760 hours. TSH: No results for input(s): "TSH" in the last 8760 hours. A1C: No results found for: "HGBA1C"   Assessment/Plan There are no diagnoses linked to this encounter.  Next appt: *** Janari Gagner Darral Ellis  [facility] [phone]

## 2024-03-03 ENCOUNTER — Encounter: Payer: Self-pay | Admitting: Orthopedic Surgery

## 2024-03-03 DIAGNOSIS — I5189 Other ill-defined heart diseases: Secondary | ICD-10-CM | POA: Insufficient documentation

## 2024-03-03 DIAGNOSIS — I350 Nonrheumatic aortic (valve) stenosis: Secondary | ICD-10-CM

## 2024-03-03 DIAGNOSIS — I480 Paroxysmal atrial fibrillation: Secondary | ICD-10-CM | POA: Insufficient documentation

## 2024-03-03 DIAGNOSIS — I10 Essential (primary) hypertension: Secondary | ICD-10-CM | POA: Insufficient documentation

## 2024-03-03 DIAGNOSIS — I38 Endocarditis, valve unspecified: Secondary | ICD-10-CM | POA: Insufficient documentation

## 2024-03-03 DIAGNOSIS — M8580 Other specified disorders of bone density and structure, unspecified site: Secondary | ICD-10-CM | POA: Insufficient documentation

## 2024-03-03 DIAGNOSIS — Z7901 Long term (current) use of anticoagulants: Secondary | ICD-10-CM | POA: Insufficient documentation

## 2024-03-03 HISTORY — DX: Long term (current) use of anticoagulants: Z79.01

## 2024-03-03 HISTORY — DX: Other ill-defined heart diseases: I51.89

## 2024-03-03 HISTORY — DX: Nonrheumatic aortic (valve) stenosis: I35.0

## 2024-03-03 HISTORY — DX: Other specified disorders of bone density and structure, unspecified site: M85.80

## 2024-03-03 HISTORY — DX: Endocarditis, valve unspecified: I38

## 2024-03-03 NOTE — Addendum Note (Signed)
 Addended byUlyses Gandy E on: 03/03/2024 02:26 PM   Modules accepted: Level of Service

## 2024-03-06 ENCOUNTER — Ambulatory Visit (HOSPITAL_BASED_OUTPATIENT_CLINIC_OR_DEPARTMENT_OTHER): Payer: Self-pay | Admitting: Family

## 2024-03-07 ENCOUNTER — Encounter (INDEPENDENT_AMBULATORY_CARE_PROVIDER_SITE_OTHER): Payer: Self-pay

## 2024-03-14 ENCOUNTER — Other Ambulatory Visit: Payer: Self-pay

## 2024-03-14 ENCOUNTER — Encounter (HOSPITAL_COMMUNITY): Payer: Self-pay

## 2024-03-14 ENCOUNTER — Ambulatory Visit (HOSPITAL_COMMUNITY): Payer: Self-pay

## 2024-03-14 LAB — PT/INR: INR: 1.6

## 2024-03-14 NOTE — Progress Notes (Signed)
 [facility] Anticoagulation Encounter Note:     Rankin    Surgical AVR plan   Warfarin start date: 01-06-2019   HVI Staff: Rankin  DX : On-X AVR   INR Range: 1.5 - 2.0  Tab strength: warfarin 2mg    Contact 1: [phone] cell   Contact 2:   Lab: POC/ - [facility]   Comments: Tylenol  regularly    [facility] anticoag SCN: Genette Kent RN ext [phone]     INR: 1.6  Current dosage: 5 mg Tu/F, 4 mg all other days         Clinical Outcomes       Positives:  INR In Range             Plan: 5 mg Tu/F, 4 mg all other days; recheck in 2 weeks. Confirmed dosing and testing with patient via MyChart message.     Next INR Date:  6.10.25    Georgina Kitchen, RN SCN  Anticoag clinic  Ext. [phone]           For additional information, see dosing calendar below.       Warfarin Therapy Instructions   May 2025 Details    Sun Mon Tue Wed Thu Fri Sat         1               2               3                 4               5               6               7               8               9               10                 11               12               13    1.8   5 mg   See details      14      4 mg         15      4 mg         16      5 mg         17      4 mg           18      4 mg         19      4 mg         20      5 mg         21      4 mg         22      4 mg         23      5 mg         24      4 mg           25  4 mg         26      4 mg         27   1.6   5 mg   See details      28      4 mg         29      4 mg         30      5 mg         31      4 mg          Date Details   05/13 Last INR check     INR: 1.8      05/27 This INR check     INR: 1.6                Warfarin Therapy Instructions   June 2025 Details    Sun Mon Tue Wed Thu Fri Sat     1      4 mg         2      4 mg         3      5 mg         4      4 mg         5      4 mg         6      5 mg         7      4 mg           8      4 mg         9      4 mg         10      5 mg         11               12               13               14                 15               16                17               18               19               20               21                 22               23               24               25               26               27                28  29               30                     Date Details   No additional details    Date of next INR:  03/28/2024

## 2024-03-16 ENCOUNTER — Other Ambulatory Visit (HOSPITAL_COMMUNITY): Payer: Self-pay | Admitting: Nurse Practitioner

## 2024-03-16 DIAGNOSIS — Z952 Presence of prosthetic heart valve: Secondary | ICD-10-CM

## 2024-03-16 MED ORDER — WARFARIN 2 MG TABLET
6.0000 mg | ORAL_TABLET | Freq: Every evening | ORAL | 0 refills | Status: DC
Start: 2024-03-16 — End: 2024-08-07

## 2024-03-17 ENCOUNTER — Ambulatory Visit: Payer: Self-pay

## 2024-03-28 ENCOUNTER — Ambulatory Visit (HOSPITAL_COMMUNITY): Payer: Self-pay

## 2024-03-28 ENCOUNTER — Encounter (HOSPITAL_COMMUNITY): Payer: Self-pay

## 2024-03-28 LAB — PT/INR: INR: 1.5

## 2024-03-28 NOTE — Progress Notes (Signed)
 [facility]VI Anticoagulation Encounter Note:     Rankin    Surgical AVR plan   Warfarin start date: 01-06-2019   H[facility] Staff: Rankin  DX : On-X AVR   INR Range: 1.5 - 2.0  Tab strength: warfarin 2mg    Contact 1: [phone] cell   Contact 2:   Lab: POC/[place] - [facility]   Comments: Tylenol  regularly    [facility] anticoag SCN: Genette Kent RN ext [phone]     INR: 1.5    Current dosage: 5mg  Tu/F and 4mg  all other days    Clinical Outcomes       Positives:  INR In Range           Plan: 6mg  tonight then resume 5mg  Tu/F and 4mg  all other days, recheck in 2 weeks. Confirmed dosing and testing with patient via MyChart message.     Next INR Date:  6.24.25    Florian Hurt, RN  Anticoag Specialty Care Nurse  Ext [phone]      For additional information, see dosing calendar below.       Warfarin Therapy Instructions   May 2025 Details    Sun [place] Bigger Tue Wed Thu Fri Sat         1               2               3                 4               5               6               7               8               9               10                 11               12               13               14               15               16               17                 18               19               20               21               22               23               24                 25                26  27   1.6   5 mg   See details      28      4 mg         29      4 mg         30      5 mg         31      4 mg          Date Details   05/27 Last INR check     INR: 1.6                Warfarin Therapy Instructions   June 2025 Details    Sun Mon Tue Wed Thu Fri Sat     1      4 mg         2      4 mg         3      5 mg         4      4 mg         5      4 mg         6      5 mg         7      4 mg           8      4 mg         9      4 mg         10   1.5   6 mg   See details      11      4 mg         12      4 mg         13      5 mg         14      4 mg           15      4 mg         16      4 mg         17      5 mg         18      4 mg          19      4 mg         20      5 mg         21      4 mg           22      4 mg         23      4 mg         24      5 mg         25               26               27               28                  29  30                     Date Details   06/10 This INR check     INR: 1.5       Date of next INR:  04/11/2024

## 2024-04-03 ENCOUNTER — Encounter: Payer: Self-pay | Admitting: Orthopedic Surgery

## 2024-04-06 ENCOUNTER — Ambulatory Visit: Admitting: Orthopedic Surgery

## 2024-04-06 ENCOUNTER — Encounter: Payer: Self-pay | Admitting: Orthopedic Surgery

## 2024-04-06 VITALS — BP 132/80 | HR 82 | Temp 97.7°F | Resp 13 | Ht 66.0 in | Wt 221.2 lb

## 2024-04-06 DIAGNOSIS — I38 Endocarditis, valve unspecified: Secondary | ICD-10-CM

## 2024-04-06 DIAGNOSIS — M85851 Other specified disorders of bone density and structure, right thigh: Secondary | ICD-10-CM

## 2024-04-06 DIAGNOSIS — I350 Nonrheumatic aortic (valve) stenosis: Secondary | ICD-10-CM

## 2024-04-06 DIAGNOSIS — I10 Essential (primary) hypertension: Secondary | ICD-10-CM

## 2024-04-06 DIAGNOSIS — R918 Other nonspecific abnormal finding of lung field: Secondary | ICD-10-CM | POA: Diagnosis not present

## 2024-04-06 DIAGNOSIS — Z85828 Personal history of other malignant neoplasm of skin: Secondary | ICD-10-CM | POA: Diagnosis not present

## 2024-04-06 DIAGNOSIS — Z1159 Encounter for screening for other viral diseases: Secondary | ICD-10-CM

## 2024-04-06 DIAGNOSIS — I480 Paroxysmal atrial fibrillation: Secondary | ICD-10-CM

## 2024-04-06 DIAGNOSIS — J432 Centrilobular emphysema: Secondary | ICD-10-CM

## 2024-04-06 MED ORDER — MUPIROCIN 2 % EX OINT: 1.0000 | TOPICAL_OINTMENT | Freq: Two times a day (BID) | CUTANEOUS | 0 refills | Status: DC

## 2024-04-06 NOTE — Progress Notes (Signed)
 Careteam: Patient Care Team: Gil Greig BRAVO, NP as PCP - General (Adult Health Nurse Practitioner)  Seen by: Greig Gil, AGNP-C  PLACE OF SERVICE:  [facility]  Advanced Directive information    Allergies  Allergen Reactions   Iodinated Contrast Media Anaphylaxis   Iodine Anaphylaxis   Metoprolol Dermatitis   Butorphanol Tartrate Other (See Comments)   Iohexol      Desc: hives- 08/04/05- asm    Latex     Added based on information entered during case entry, please review and add reactions, type, and severity as needed   Origanum Oil     Allergy to Italian/Greek seasoning Hives   Tomato Hives    Chief Complaint  Patient presents with   Medical Management of Chronic Issues    4-6 week follow up  Discuss Shingles , COVID  Physical      HPI: Patient is a 68 y.o. female seen today for medical managemrnt of chronic condition.   Discussed the use of AI scribe software for clinical note transcription with the patient, who gave verbal consent to proceed.  History of Present Illness    She missed her annual lung cancer screening this year, which is usually done in May. Her last screening showed no suspicious pulmonary nodules or masses, but there were scattered pulmonary nodules less than 3 mm and a calcified granuloma. She has a history of smoking but quit some time ago and has been receiving annual lung cancer screenings due to her past smoking history.  She is currently on warfarin and monitors her INR every other week, reporting to her cardiologist in [place] . Her last INR was 1.5. She is in the process of transitioning her cardiology care to a local provider and has an appointment scheduled for August 19th. She has been managing her medication refills through her previous cardiologist and has enough warfarin until her upcoming appointment.  She has a history of skin cancer, having had basal cell carcinoma removed in May 2020 and squamous cell carcinoma removed more  recently. She uses mupirocin  for a skin issue above her buttock, which has shown some improvement but is not completely resolved. Her last full skin check was in June or July of the previous year.  Socially, she recently moved from [place]  to [place]  after her daughter asked her to leave. She is dealing with the emotional impact of this situation and has not spoken to her daughter since moving. She maintains contact with her grandchildren via text. She has two sisters, one in [place]  and one in the [place], but does not have a strong support system locally. She experiences difficulty sleeping, often waking up at 3 AM and being unable to stop thinking about her situation with her daughter. Not interested in antidepressant or therapy at this time.     Past procedures:  CT lung cancer screening done 03/16/2023> moderate centrilobular emphysema, no suspicious pulmonary nodule or mass, few tiny scattered pulmonary nodules measuring < 3 mm  DEXA 02/28/2020> right hip femoral neck -1.6> osteopenia  Mammogram 11/29/2023> no evidence of suspicious mass, calcification      Review of Systems:  Review of Systems  Constitutional: Negative.   HENT: Negative.    Respiratory: Negative.    Cardiovascular: Negative.   Gastrointestinal: Negative.   Genitourinary: Negative.   Musculoskeletal: Negative.   Skin: Negative.   Neurological: Negative.   Psychiatric/Behavioral:  Positive for depression.     Past Medical History:  Diagnosis Date   Hypertension    Past Surgical History:  Procedure Laterality Date   TOTAL HIP ARTHROPLASTY Bilateral    Social History:   reports that she quit smoking about 11 years ago. Her smoking use included cigarettes. She started smoking about 51 years ago. She has a 40 pack-year smoking history. She has never used smokeless tobacco. She reports current alcohol use of about 5.0 - 6.0 standard drinks of alcohol per week. She reports that she  does not use drugs.  Family History  Problem Relation Age of Onset   Atrial fibrillation Mother    Sudden Cardiac Death Mother    Lung cancer Father     Medications: Patient's Medications  New Prescriptions   No medications on file  Previous Medications   ACETAMINOPHEN (TYLENOL) 325 MG TABLET    Take 325 mg by mouth as needed.   ASCORBIC ACID (VITAMIN C) 500 MG TABLET    Take 250 mg by mouth daily.   ASPIRIN 81 MG CHEWABLE TABLET    Chew 81 mg by mouth daily.   CALCIUM-VITAMIN D (CALTRATE 600 PLUS-VIT D PO)    Take 600 mg by mouth every other day.   CHOLECALCIFEROL 50 MCG (2000 UT) CAPS    Take 2,000 Units by mouth daily.   CLOBETASOL CREAM (TEMOVATE) 0.05 %    Apply 1 Application topically as needed.   CYANOCOBALAMIN (VITAMIN B12) 500 MCG TABLET    Take 500 mcg by mouth daily.   HYDROCHLOROTHIAZIDE (HYDRODIURIL) 25 MG TABLET    Take 25 mg by mouth daily.   LORATADINE (CLARITIN) 10 MG TABLET    Take 1 tablet by mouth daily.   OLOPATADINE HCL 0.2 % SOLN    Apply 1 drop to eye daily.   ONDANSETRON (ZOFRAN-ODT) 4 MG DISINTEGRATING TABLET    Take 4 mg by mouth as needed.   TRIAMCINOLONE OINTMENT (KENALOG) 0.1 %    Apply 1 Application topically as needed.   WARFARIN (COUMADIN) 2 MG TABLET    Take 4-5 mg by mouth daily.  Modified Medications   No medications on file  Discontinued Medications   No medications on file    Physical Exam:  Vitals:   04/06/24 1025  BP: 132/80  Pulse: 82  Resp: 13  Temp: 97.7 F (36.5 C)  TempSrc: Temporal  SpO2: 95%  Weight: 221 lb 3.4 oz (100.3 kg)  Height: 5' 6 (1.676 m)   Body mass index is 35.7 kg/m. Wt Readings from Last 3 Encounters:  04/06/24 221 lb 3.4 oz (100.3 kg)  03/02/24 217 lb 12.8 oz (98.8 kg)    Physical Exam Vitals reviewed.  Constitutional:      General: She is not in acute distress. HENT:     Head: Normocephalic.     Right Ear: Tympanic membrane normal.     Left Ear: Tympanic membrane normal.     Nose: Nose  normal.     Mouth/Throat:     Mouth: Mucous membranes are moist.   Eyes:     General:        Right eye: No discharge.        Left eye: No discharge.   Neck:     Thyroid: No thyroid mass or thyromegaly.   Cardiovascular:     Rate and Rhythm: Normal rate and regular rhythm.     Pulses: Normal pulses.     Heart sounds: Normal heart sounds.  Pulmonary:     Effort: Pulmonary effort is normal. No  respiratory distress.     Breath sounds: Normal breath sounds. No wheezing or rales.  Abdominal:     General: There is no distension.     Palpations: Abdomen is soft.     Tenderness: There is no abdominal tenderness.   Musculoskeletal:     Cervical back: Neck supple.     Right lower leg: Edema present.     Left lower leg: Edema present.     Comments: Non pitting   Skin:    General: Skin is warm.     Capillary Refill: Capillary refill takes less than 2 seconds.   Neurological:     General: No focal deficit present.     Mental Status: She is alert and oriented to person, place, and time.   Psychiatric:        Mood and Affect: Mood normal.     Labs reviewed: Basic Metabolic Panel: No results for input(s): NA, K, CL, CO2, GLUCOSE, BUN, CREATININE, CALCIUM, MG, PHOS, TSH in the last 8760 hours. Liver Function Tests: No results for input(s): AST, ALT, ALKPHOS, BILITOT, PROT, ALBUMIN in the last 8760 hours. No results for input(s): LIPASE, AMYLASE in the last 8760 hours. No results for input(s): AMMONIA in the last 8760 hours. CBC: No results for input(s): WBC, NEUTROABS, HGB, HCT, MCV, PLT in the last 8760 hours. Lipid Panel: No results for input(s): CHOL, HDL, LDLCALC, TRIG, CHOLHDL, LDLDIRECT in the last 8760 hours. TSH: No results for input(s): TSH in the last 8760 hours. A1C: No results found for: HGBA1C   Assessment/Plan 1. Pulmonary nodules (Primary) - quit smoking 11 years ago - 40 pack per year  history - asymptomatic - 03/16/2023 CT chest no suspicious nodule or mass, tiny scattered pulmonary nodules noted - CT CHEST LUNG CA SCREEN LOW DOSE W/O CM; Future  2. Severe aortic stenosis - s/p mechanical AVR On-X21 mm 01/04/2019 - cont warfarin  - Protime-INR> 1.4 04/06/2024 - warfarin management by cardiology in [facility] until established with cardiology in [place]  3. Primary hypertension - controlled with HCTZ - CBC with Differential/Platelet  4. History of basal cell carcinoma - mupirocin  ointment (BACTROBAN ) 2 %; Apply 1 Application topically 2 (two) times daily.  Dispense: 22 g; Refill: 0  5. Need for hepatitis C screening test - Hepatitis C Antibody  6. Paroxysmal atrial fibrillation due to heart valve disorder (HCC) - HR< 100 without medication - see above - TSH> 1.66 04/06/2024  7. Centrilobular emphysema (HCC) - noted CT chest 03/16/2023 - asymptomatic - quit smoking 11 years ago  8. Osteopenia of neck of right femur - DEXA t score -1.6 02/28/2020 - now on Caltrate - repeat DEXA with mammogram 2026  Total time: 35 minutes. Greater than 50% of total time spent doing patient education regarding health maintenance, aortic stenosis, warfarin management, HTN, Afib, skin health and osteopenia including symptom/medication management.      Next appt: 05/04/2024  Greig Cluster, ELNITA  [facility] [phone]

## 2024-04-06 NOTE — Patient Instructions (Addendum)
 [facility]- [phone]  Schedule in person Medicare Annual Wellness   Fasting lab work next encounter> water or black coffee only 12 hours prior to exam

## 2024-04-07 ENCOUNTER — Ambulatory Visit: Payer: Self-pay | Admitting: Orthopedic Surgery

## 2024-04-07 LAB — CBC WITH DIFFERENTIAL/PLATELET
Absolute Lymphocytes: 1533 {cells}/uL (ref 850–3900)
Absolute Monocytes: 559 {cells}/uL (ref 200–950)
Basophils Absolute: 40 {cells}/uL (ref 0–200)
Basophils Relative: 0.7 %
Eosinophils Absolute: 97 {cells}/uL (ref 15–500)
Eosinophils Relative: 1.7 %
HCT: 44.2 % (ref 35.0–45.0)
Hemoglobin: 14.5 g/dL (ref 11.7–15.5)
MCH: 30.3 pg (ref 27.0–33.0)
MCHC: 32.8 g/dL (ref 32.0–36.0)
MCV: 92.5 fL (ref 80.0–100.0)
MPV: 8.8 fL (ref 7.5–12.5)
Monocytes Relative: 9.8 %
Neutro Abs: 3471 {cells}/uL (ref 1500–7800)
Neutrophils Relative %: 60.9 %
Platelets: 292 10*3/uL (ref 140–400)
RBC: 4.78 10*6/uL (ref 3.80–5.10)
RDW: 11.9 % (ref 11.0–15.0)
Total Lymphocyte: 26.9 %
WBC: 5.7 10*3/uL (ref 3.8–10.8)

## 2024-04-07 LAB — TSH: TSH: 1.66 m[IU]/L (ref 0.40–4.50)

## 2024-04-07 LAB — PROTIME-INR
INR: 1.4 — ABNORMAL HIGH
Prothrombin Time: 14.3 s — ABNORMAL HIGH (ref 9.0–11.5)

## 2024-04-07 LAB — HEPATITIS C ANTIBODY: Hepatitis C Ab: NONREACTIVE

## 2024-04-11 ENCOUNTER — Encounter (HOSPITAL_COMMUNITY): Payer: Self-pay

## 2024-04-11 ENCOUNTER — Ambulatory Visit (HOSPITAL_COMMUNITY): Payer: Self-pay

## 2024-04-11 LAB — PT/INR: INR: 1.6

## 2024-04-11 NOTE — Progress Notes (Signed)
 [facility]VI Anticoagulation Encounter Note:     [name]    Surgical AVR plan   Warfarin start date: 01-06-2019   [facility]VI Staff: [name]  DX : On-X AVR   INR Range: 1.5 - 2.0  Tab strength: warfarin 2mg    Contact 1: [phone] cell   Contact 2:   Lab: POC/[place] - [facility]   Comments: Tylenol  regularly    HVI anticoag SCN: Rico Pane RN ext [phone]     INR: 1.6  Current dosage: 6 mg Tuesday 6.10.25 then 5 mg F/Tu, 4 mg all other days         Clinical Outcomes       Positives:  INR In Range             Plan: 5 mg Tu/F, 4 mg all other days; recheck in one week. Confirmed dosing and testing with patient via MyChart message.     Next INR Date:  7.1.25    [name] Pane, RN SCN  Anticoag clinic  Ext. [phone]          For additional information, see dosing calendar below.       Warfarin Therapy Instructions   June 2025 Details    Sun Mon Tue Wed Thu Fri Sat     1               2               3               4               5               6               7                 8               9               10    1.5   6 mg   See details      11      4 mg         12      4 mg         13      5 mg         14      4 mg           15      4 mg         16      4 mg         17      5 mg         18      4 mg         19      4 mg         20      5 mg         21      4 mg           22      4 mg         23      4 mg         24   1.6   5 mg   See details  25      4 mg         26      4 mg         27      5 mg         28      4 mg           29      4 mg         30      4 mg               Date Details   06/10 Last INR check     INR: 1.5      06/24 This INR check     INR: 1.6                Warfarin Therapy Instructions   July 2025 Details    Sun Mon Tue Wed Thu Fri Sat       1      5 mg         2               3               4               5                 6               7               8               9               10               11               12                 13               14               15               16               17                18               19                 20               21               22               23               24               25               26                 27                28  29               30               31                   Date Details   No additional details    Date of next INR:  04/18/2024

## 2024-04-12 ENCOUNTER — Telehealth: Payer: Self-pay

## 2024-04-12 ENCOUNTER — Other Ambulatory Visit: Payer: Self-pay | Admitting: Orthopedic Surgery

## 2024-04-12 DIAGNOSIS — R918 Other nonspecific abnormal finding of lung field: Secondary | ICD-10-CM

## 2024-04-12 DIAGNOSIS — J432 Centrilobular emphysema: Secondary | ICD-10-CM

## 2024-04-12 NOTE — Telephone Encounter (Signed)
 [facility] called and states that patient has an order for CT CHEST LUNG CANCER SCREENING but it needs to be changed to CT CHEST WITHOUT for lung nodules. Message routed to PCP Gil Greig BRAVO, NP to reorder imaging.

## 2024-04-12 NOTE — Telephone Encounter (Signed)
 Noted

## 2024-04-12 NOTE — Telephone Encounter (Signed)
 New orders placed

## 2024-04-19 ENCOUNTER — Ambulatory Visit (HOSPITAL_COMMUNITY): Payer: Self-pay

## 2024-04-19 ENCOUNTER — Encounter (HOSPITAL_COMMUNITY): Payer: Self-pay

## 2024-04-19 LAB — PT/INR: INR: 1.6

## 2024-04-19 NOTE — Progress Notes (Signed)
 [facility] Anticoagulation Encounter Note:     [name]    Surgical AVR plan   Warfarin start date: 01-06-2019   [facility] Staff: [name]  DX : On-X AVR   INR Range: 1.5 - 2.0  Tab strength: warfarin 2mg    Contact 1: [phone] cell   Contact 2:   Lab: POC/[place] - [facility]   Comments: Tylenol  regularly    HVI anticoag SCN: Rico Pane RN ext [phone]     INR: 1.6  Current dosage: 5 mg Tu/F, 4 mg all other days         Clinical Outcomes       Positives:  INR In Range             Plan: 5 mg Tu/F, 4 mg all other days; recheck in 2 weeks. Confirmed dosing and testing with patient via MyChart message.     Next INR Date:  7.15.25    Rosaline Pane, RN SCN  Anticoag clinic  Ext. [phone]           For additional information, see dosing calendar below.       Warfarin Therapy Instructions   June 2025 Details    Sun Mon Tue Wed Thu Fri Sat     1               2               3               4               5               6               7                 8               9               10               11               12               13               14                 15               16               17               18               19               20               21                 22               23               24    1.6   5 mg   See details      25      4 mg         26  4 mg         27      5 mg         28      4 mg           29      4 mg         30      4 mg               Date Details   06/24 Last INR check     INR: 1.6                Warfarin Therapy Instructions   July 2025 Details    Sun Mon Tue Wed Thu Fri Sat       1      5 mg         2   1.6   4 mg   See details      3      4 mg         4      5 mg         5      4 mg           6      4 mg         7      4 mg         8      5 mg         9      4 mg         10      4 mg         11      5 mg         12      4 mg           13      4 mg         14      4 mg         15      5 mg         16               17               18               19                 20               21                22               23               24               25               26                 27               28               29                30  31                  Date Details   07/02 This INR check     INR: 1.6       Date of next INR:  05/02/2024

## 2024-04-26 ENCOUNTER — Ambulatory Visit (HOSPITAL_BASED_OUTPATIENT_CLINIC_OR_DEPARTMENT_OTHER): Payer: Self-pay | Admitting: Family

## 2024-05-02 ENCOUNTER — Ambulatory Visit (HOSPITAL_COMMUNITY): Payer: Self-pay

## 2024-05-02 ENCOUNTER — Encounter (HOSPITAL_COMMUNITY): Payer: Self-pay

## 2024-05-02 LAB — PT/INR: INR: 1.7

## 2024-05-02 NOTE — Progress Notes (Signed)
 HVI Anticoagulation Encounter Note:     Rankin    Surgical AVR plan   Warfarin start date: 01-06-2019   [facility]VI Staff: Rankin  DX : On-X AVR   INR Range: 1.5 - 2.0  Tab strength: warfarin 2mg    Contact 1: [phone] cell   Contact 2:   Lab: POC/[place] - [facility]   Comments: Tylenol  regularly    HVI anticoag SCN: Rico Pane RN ext [phone]     INR: 1.7  Current dosage: 5 mg Tu/F, 4 mg all other days         Clinical Outcomes       Positives:  INR In Range             Plan: 5 mg Tu/F, 4 mg all other days; recheck in two weeks. Confirmed dosing and testing with patient via MyChart message.     Next INR Date:  7.29.25    Rosaline Pane, RN SCN  Anticoag clinic  Ext. [phone]           For additional information, see dosing calendar below.       Warfarin Therapy Instructions   July 2025 Details    Sun Mon Tue Wed Thu Fri Sat       1               2   1.6   4 mg   See details      3      4 mg         4      5 mg         5      4 mg           6      4 mg         7      4 mg         8      5 mg         9      4 mg         10      4 mg         11      5 mg         12      4 mg           13      4 mg         14      4 mg         15   1.7   5 mg   See details      16      4 mg         17      4 mg         18      5 mg         19      4 mg           20      4 mg         21      4 mg         22      5 mg         23      4 mg         24      4 mg  25      5 mg         26      4 mg           27      4 mg         28      4 mg         29      5 mg         30               31                  Date Details   07/02 Last INR check     INR: 1.6      07/15 This INR check     INR: 1.7       Date of next INR:  05/16/2024

## 2024-05-04 ENCOUNTER — Ambulatory Visit: Admitting: Orthopedic Surgery

## 2024-05-04 ENCOUNTER — Encounter: Payer: Self-pay | Admitting: Orthopedic Surgery

## 2024-05-04 VITALS — BP 138/84 | HR 93 | Temp 97.8°F | Resp 16 | Ht 66.0 in | Wt 224.0 lb

## 2024-05-04 DIAGNOSIS — Z0000 Encounter for general adult medical examination without abnormal findings: Secondary | ICD-10-CM | POA: Diagnosis not present

## 2024-05-04 DIAGNOSIS — Z792 Long term (current) use of antibiotics: Secondary | ICD-10-CM

## 2024-05-04 MED ORDER — AMOXICILLIN 500 MG PO CAPS: 1000.0000 mg | ORAL_CAPSULE | Freq: Once | ORAL | 0 refills | Status: AC | PRN

## 2024-05-04 NOTE — Patient Instructions (Signed)
  Miranda Collins , Thank you for taking time to come for your Medicare Wellness Visit. I appreciate your ongoing commitment to your health goals. Please review the following plan we discussed and let me know if I can assist you in the future.   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   An eating disorder is an unhealthy relationship with food and eating.  Starting a more natural way of eating is very important.  Letting go of rituals and rules of eating is a good way to start.    Notes:         This is a list of the screening recommended for you and due dates:  Health Maintenance  Topic Date Due   Screening for Lung Cancer  Never done   Zoster (Shingles) Vaccine (1 of 2) 07/07/2024*   Flu Shot  05/19/2024   Medicare Annual Wellness Visit  05/04/2025   Mammogram  11/28/2025   DTaP/Tdap/Td vaccine (3 - Td or Tdap) 11/08/2028   Colon Cancer Screening  05/14/2030   Pneumococcal Vaccine for age over 6  Completed   DEXA scan (bone density measurement)  Completed   Hepatitis C Screening  Completed   Hepatitis B Vaccine  Aged Out   HPV Vaccine  Aged Out   Meningitis B Vaccine  Aged Out   COVID-19 Vaccine  Discontinued  *Topic was postponed. The date shown is not the original due date.

## 2024-05-04 NOTE — Progress Notes (Signed)
 Subjective:   Miranda Collins is a 68 y.o. female who presents for Medicare Annual (Subsequent) preventive examination.  Visit Complete: In person  Patient Medicare AWV questionnaire was completed by the patient on 05/04/2024; I have confirmed that all information answered by patient is correct and no changes since this date.  Cardiac Risk Factors include: obesity (BMI >30kg/m2);advanced age (>26men, >36 women);hypertension     Objective:    Today's Vitals   05/04/24 1021  BP: 138/84  Pulse: 93  Resp: 16  Temp: 97.8 F (36.6 C)  SpO2: 97%  Weight: 224 lb (101.6 kg)  Height: 5' 6 (1.676 m)   Body mass index is 36.15 kg/m.     05/04/2024   10:31 AM 03/02/2024   10:09 AM  Advanced Directives  Does Patient Have a Medical Advance Directive? Yes Yes  Type of Estate agent of [place];Living will Healthcare Power of [place];Living will  Does patient want to make changes to medical advance directive? No - Patient declined No - Patient declined  Copy of Healthcare Power of Attorney in Chart? No - copy requested No - copy requested    Current Medications (verified) Outpatient Encounter Medications as of 05/04/2024  Medication Sig   acetaminophen (TYLENOL) 325 MG tablet Take 325 mg by mouth as needed.   ascorbic acid (VITAMIN C) 500 MG tablet Take 250 mg by mouth daily.   aspirin 81 MG chewable tablet Chew 81 mg by mouth daily.   Calcium-Vitamin D (CALTRATE 600 PLUS-VIT D PO) Take 600 mg by mouth every other day.   Cholecalciferol 50 MCG (2000 UT) CAPS Take 2,000 Units by mouth daily.   clobetasol cream (TEMOVATE) 0.05 % Apply 1 Application topically as needed.   cyanocobalamin (VITAMIN B12) 500 MCG tablet Take 500 mcg by mouth as needed.   hydrochlorothiazide (HYDRODIURIL) 25 MG tablet Take 25 mg by mouth daily.   loratadine (CLARITIN) 10 MG tablet Take 1 tablet by mouth daily.   mupirocin  ointment (BACTROBAN ) 2 % Apply 1 Application topically as  needed.   Olopatadine HCl 0.2 % SOLN Apply 1 drop to eye daily.   ondansetron (ZOFRAN-ODT) 4 MG disintegrating tablet Take 4 mg by mouth as needed.   triamcinolone ointment (KENALOG) 0.1 % Apply 1 Application topically as needed.   warfarin (COUMADIN) 2 MG tablet Take 4-5 mg by mouth daily.   [DISCONTINUED] mupirocin  ointment (BACTROBAN ) 2 % Apply 1 Application topically 2 (two) times daily. (Patient not taking: Reported on 05/04/2024)   No facility-administered encounter medications on file as of 05/04/2024.    Allergies (verified) Iodinated contrast media, Iodine, Metoprolol, Butorphanol tartrate, Iohexol, Latex, Origanum oil, and Tomato   History: Past Medical History:  Diagnosis Date   Hypertension    Past Surgical History:  Procedure Laterality Date   TOTAL HIP ARTHROPLASTY Bilateral    Family History  Problem Relation Age of Onset   Atrial fibrillation Mother    Sudden Cardiac Death Mother    Lung cancer Father    Social History   Socioeconomic History   Marital status: Married    Spouse name: Not on file   Number of children: Not on file   Years of education: Not on file   Highest education level: Not on file  Occupational History   Not on file  Tobacco Use   Smoking status: Former    Current packs/day: 0.00    Average packs/day: 1 pack/day for 40.0 years (40.0 ttl pk-yrs)    Types: Cigarettes  Start date: 53    Quit date: 2014    Years since quitting: 11.5   Smokeless tobacco: Never  Substance and Sexual Activity   Alcohol use: Yes    Alcohol/week: 5.0 - 6.0 standard drinks of alcohol    Types: 5 - 6 Glasses of wine per week   Drug use: Never   Sexual activity: Not on file  Other Topics Concern   Not on file  Social History Narrative   Not on file   Social Drivers of Health   Financial Resource Strain: Low Risk  (05/04/2024)   Overall Financial Resource Strain (CARDIA)    Difficulty of Paying Living Expenses: Not hard at all  Food Insecurity: No  Food Insecurity (05/04/2024)   Hunger Vital Sign    Worried About Running Out of Food in the Last Year: Never true    Ran Out of Food in the Last Year: Never true  Transportation Needs: No Transportation Needs (05/04/2024)   PRAPARE - Administrator, Civil Service (Medical): No    Lack of Transportation (Non-Medical): No  Physical Activity: Insufficiently Active (05/04/2024)   Exercise Vital Sign    Days of Exercise per Week: 2 days    Minutes of Exercise per Session: 50 min  Stress: Stress Concern Present (05/04/2024)   [employer] of Occupational Health - Occupational Stress Questionnaire    Feeling of Stress: To some extent  Social Connections: Socially Isolated (05/04/2024)   Social Connection and Isolation Panel    Frequency of Communication with Friends and Family: More than three times a week    Frequency of Social Gatherings with Friends and Family: More than three times a week    Attends Religious Services: Never    Database administrator or Organizations: No    Attends Engineer, structural: Never    Marital Status: Divorced    Tobacco Counseling Counseling given: Not Answered   Clinical Intake:  Pre-visit preparation completed: Yes  Pain : No/denies pain     BMI - recorded: 36.15 Nutritional Status: BMI > 30  Obese Nutritional Risks: None Diabetes: No  How often do you need to have someone help you when you read instructions, pamphlets, or other written materials from your doctor or pharmacy?: 1 - Never What is the last grade level you completed in school?: [employer]'s degree  Interpreter Needed?: No      Activities of Daily Living    05/04/2024   10:44 AM  In your present state of health, do you have any difficulty performing the following activities:  Hearing? 0  Vision? 0  Difficulty concentrating or making decisions? 0  Walking or climbing stairs? 0  Dressing or bathing? 0  Doing errands, shopping? 0  Preparing Food and  eating ? N  Using the Toilet? N  In the past six months, have you accidently leaked urine? Y  Do you have problems with loss of bowel control? N  Managing your Medications? N  Managing your Finances? N  Housekeeping or managing your Housekeeping? N    Patient Care Team: Gil Greig BRAVO, NP as PCP - General (Adult Health Nurse Practitioner)  Indicate any recent Medical Services you may have received from other than [facility] providers in the past year (date may be approximate).     Assessment:   This is a routine wellness examination for [place].  Hearing/Vision screen Hearing Screening - Comments:: No hearing concerns.  Vision Screening - Comments:: Some vision concerns. Patient last  eye exam 2024. Patient wears prescription glasses.    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   An eating disorder is an unhealthy relationship with food and eating.  Starting a more natural way of eating is very important.  Letting go of rituals and rules of eating is a good way to start.    Notes:        Depression Screen    05/04/2024   10:27 AM 03/02/2024   10:10 AM  PHQ 2/9 Scores  PHQ - 2 Score 2 0  PHQ- 9 Score 3     Fall Risk    05/04/2024   10:27 AM 03/02/2024   10:09 AM  Fall Risk   Falls in the past year? 0 0  Number falls in past yr: 0 0  Injury with Fall? 0 0  Risk for fall due to : History of fall(s) No Fall Risks  Follow up Falls evaluation completed Falls evaluation completed    MEDICARE RISK AT HOME: Medicare Risk at Home Any stairs in or around the home?: Yes If so, are there any without handrails?: Yes Home free of loose throw rugs in walkways, pet beds, electrical  cords, etc?: Yes Adequate lighting in your home to reduce risk of falls?: Yes Life alert?: No Use of a cane, walker or w/c?: Yes Grab bars in the bathroom?: Yes Shower chair or bench in shower?: Yes Elevated toilet seat or a handicapped toilet?: Yes  TIMED UP AND GO:  Was the test performed?  Yes  Length of time to ambulate 10 feet: < 5  sec Gait steady and fast without use of assistive device    Cognitive Function:        05/04/2024   10:34 AM  6CIT Screen  What Year? 0 points  What month? 0 points  What time? 0 points  Count back from 20 0 points  Months in reverse 0 points  Repeat phrase 0 points  Total Score 0 points    Immunizations Immunization History  Administered Date(s) Administered   Fluad Trivalent(High Dose 65+) 08/26/2020   Influenza, Seasonal, Injecte, Preservative Fre 07/11/2019, 07/15/2021   PFIZER(Purple Top)SARS-COV-2 Vaccination 12/13/2019, 01/03/2020, 08/26/2020   Pfizer Covid-19 Vaccine Bivalent Booster 27yrs & up 07/15/2021   Pneumococcal Conjugate (Pcv15) 02/12/2022   Pneumococcal Polysaccharide-23 01/13/2021   Td 10/19/1998   Tdap 11/08/2018    TDAP status: Up to date  Flu Vaccine status: Up to date  Pneumococcal vaccine status: Up to date  Covid-19 vaccine status: Completed vaccines  Qualifies for Shingles Vaccine? No   Zostavax completed No   Shingrix Completed?: No.    Education has been provided regarding the importance of this vaccine. Patient has been advised to call insurance company to determine out of pocket expense if they have not yet received this vaccine. Advised may also receive vaccine at local  pharmacy or Health Dept. Verbalized acceptance and understanding.  Screening Tests Health Maintenance  Topic Date Due   Lung Cancer Screening  Never done   COVID-19 Vaccine (5 - 2024-25 season) 06/20/2023   Zoster Vaccines- Shingrix (1 of 2) 07/07/2024 (Originally 10/28/1974)   INFLUENZA VACCINE  05/19/2024   Medicare Annual  Wellness (AWV)  05/04/2025   MAMMOGRAM  11/28/2025   DTaP/Tdap/Td (3 - Td or Tdap) 11/08/2028   Colonoscopy  05/14/2030   Pneumococcal Vaccine: 50+ Years  Completed   DEXA SCAN  Completed   Hepatitis C Screening  Completed   Hepatitis B Vaccines  Aged Out   HPV VACCINES  Aged Out   Meningococcal B Vaccine  Aged Out    Health Maintenance  Health Maintenance Due  Topic Date Due   Lung Cancer Screening  Never done   COVID-19 Vaccine (5 - 2024-25 season) 06/20/2023    Colorectal cancer screening: Type of screening: Colonoscopy. Completed 05/14/2020. Repeat every 10 years  Mammogram status: Completed 11/29/2023. Repeat every year  Bone Density status: Completed 02/2020. Results reflect: Bone density results: OSTEOPENIA. Repeat every 5 years.  Lung Cancer Screening: (Low Dose CT Chest recommended if Age 3-80 years, 20 pack-year currently smoking OR have quit w/in 15years.) does qualify.   Lung Cancer Screening Referral: Yes> done 04/12/2024  Additional Screening:  Hepatitis C Screening: does not qualify; Completed No  Vision Screening: Recommended annual ophthalmology exams for early detection of glaucoma and other disorders of the eye. Is the patient up to date with their annual eye exam?  Yes  Who is the provider or what is the name of the office in which the patient attends annual eye exams? [facility] If pt is not established with a provider, would they like to be referred to a provider to establish care? No .   Dental Screening: Recommended annual dental exams for proper oral hygiene  Diabetic Foot Exam: Diabetic Foot Exam: Completed 05/04/2024  Community Resource Referral / Chronic Care Management: CRR required this visit?  No   CCM required this visit?  No     Plan:     I have personally reviewed and noted the following in the patient's chart:   Medical and social history Use of alcohol, tobacco or illicit drugs  Current medications and supplements  including opioid prescriptions. Patient is not currently taking opioid prescriptions. Functional ability and status Nutritional status Physical activity Advanced directives List of other physicians Hospitalizations, surgeries, and ER visits in previous 12 months Vitals Screenings to include cognitive, depression, and falls Referrals and appointments  In addition, I have reviewed and discussed with patient certain preventive protocols, quality metrics, and best practice recommendations. A written personalized care plan for preventive services as well as general preventive health recommendations were provided to patient.     Greig FORBES Cluster, NP   05/04/2024   After Visit Summary: (MyChart) Due to this being a telephonic visit, the after visit summary with patients personalized plan was offered to patient via MyChart   Nurse Notes: 6 CIT score 0. Mammogram and bone density due 11/2024. CT r/o lung cancer ordered> patient needs to schedule. Does not want future covid vaccines. Discussed and shingles and flu vaccine.

## 2024-05-16 ENCOUNTER — Encounter (HOSPITAL_COMMUNITY): Payer: Self-pay

## 2024-05-16 ENCOUNTER — Ambulatory Visit (HOSPITAL_COMMUNITY): Payer: Self-pay

## 2024-05-16 LAB — PT/INR: INR: 1.6

## 2024-05-16 NOTE — Progress Notes (Signed)
 HVI Anticoagulation Encounter Note:     Rankin    Surgical AVR plan   Warfarin start date: 01-06-2019   [facility]VI Staff: Rankin  DX : On-X AVR   INR Range: 1.5 - 2.0  Tab strength: warfarin 2mg    Contact 1: [phone] cell   Contact 2:   Lab: POC/ - [facility]   Comments: Tylenol  regularly    HVI anticoag SCN: Rico Pane RN ext [phone]     INR: 1.6  Current dosage: 5 mg Tu/F, 4 mg all other days         Clinical Outcomes       Positives:  INR In Range             Plan: 5 mg Tu/F, 4 mg all other days; recheck INR in two weeks. Confirmed dosing and testing with patient via MyChart message.     Next INR Date:  8.12.25    Rosaline Pane, RN SCN  Anticoag clinic  Ext. [phone]           For additional information, see dosing calendar below.       Warfarin Therapy Instructions   July 2025 Details    Sun Mon Tue Wed Thu Fri Sat       1               2               3               4               5                 6               7               8               9               10               11               12                 13               14               15    1.7   5 mg   See details      16      4 mg         17      4 mg         18      5 mg         19      4 mg           20      4 mg         21      4 mg         22      5 mg         23      4 mg         24      4 mg         25  5 mg         26      4 mg           27      4 mg         28      4 mg         29   1.6   5 mg   See details      30      4 mg         31      4 mg            Date Details   07/15 Last INR check     INR: 1.7      07/29 This INR check     INR: 1.6                Warfarin Therapy Instructions   August 2025 Details    Sun Mon Tue Wed Thu Fri Sat          1      5 mg         2      4 mg           3      4 mg         4      4 mg         5      5 mg         6      4 mg         7      4 mg         8      5 mg         9      4 mg           10      4 mg         11      4 mg         12      5 mg         13               14               15                16                 17               18               19               20               21               22               23                 24               25               26                27  28               29               30                 31                       Date Details   No additional details    Date of next INR:  05/30/2024

## 2024-05-30 ENCOUNTER — Encounter: Payer: Self-pay | Admitting: Cardiology

## 2024-05-30 ENCOUNTER — Encounter (HOSPITAL_COMMUNITY): Payer: Self-pay

## 2024-05-30 ENCOUNTER — Ambulatory Visit (HOSPITAL_COMMUNITY): Payer: Self-pay

## 2024-05-30 LAB — PT/INR: INR: 1.5

## 2024-05-30 NOTE — Progress Notes (Signed)
 HVI Anticoagulation Encounter Note:     Rankin    Surgical AVR plan   Warfarin start date: 01-06-2019   HVI Staff: Rankin  DX : On-X AVR   INR Range: 1.5 - 2.0  Tab strength: warfarin 2mg    Contact 1: [phone] cell   Contact 2:   Lab: [facility]   Comments: Tylenol  regularly    [facility]VI anticoag SCN: Rico Pane RN ext [phone]     INR: 1.5  Current dosage: 5 mg Tu/F, 4 mg all other days         Clinical Outcomes       Positives:  INR In Range             Plan: 5 mg Tu/F, 4 mg all other days; recheck in 2 weeks. Confirmed dosing and testing with patient via mychart.    Next INR Date:  8.26.25    Rosaline Pane, RN SCN  Anticoag clinic  Ext. [phone]           For additional information, see dosing calendar below.       Warfarin Therapy Instructions   July 2025 Details    Sun Pablo Tue Wed Thu Fri Sat       1               2               3               4               5                 6               7               8               9               10               11               12                 13               14               15               16               17               18               19                 20               21               22               23               24               25                26  27               28               29    1.6   5 mg   See details      30      4 mg         31      4 mg            Date Details   07/29 Last INR check     INR: 1.6                Warfarin Therapy Instructions   August 2025 Details    Sun Mon Tue Wed Thu Fri Sat          1      5 mg         2      4 mg           3      4 mg         4      4 mg         5      5 mg         6      4 mg         7      4 mg         8      5 mg         9      4 mg           10      4 mg         11      4 mg         12   1.5   5 mg   See details      13      4 mg         14      4 mg         15      5 mg         16      4 mg           17      4 mg         18      4 mg         19      5 mg         20      4 mg          21      4 mg         22      5 mg         23      4 mg           24      4 mg         25      4 mg         26      5 mg         27               28               29  30                 31                      Date Details   08/12 This INR check     INR: 1.5       Date of next INR:  06/13/2024

## 2024-06-06 ENCOUNTER — Telehealth: Payer: Self-pay

## 2024-06-06 ENCOUNTER — Encounter: Payer: Self-pay | Admitting: Internal Medicine

## 2024-06-06 ENCOUNTER — Ambulatory Visit: Payer: Self-pay | Admitting: Cardiology

## 2024-06-06 ENCOUNTER — Ambulatory Visit: Attending: Internal Medicine | Admitting: Internal Medicine

## 2024-06-06 VITALS — BP 128/74 | HR 98 | Ht 65.5 in | Wt 224.6 lb

## 2024-06-06 DIAGNOSIS — I10 Essential (primary) hypertension: Secondary | ICD-10-CM | POA: Diagnosis not present

## 2024-06-06 DIAGNOSIS — Z952 Presence of prosthetic heart valve: Secondary | ICD-10-CM | POA: Insufficient documentation

## 2024-06-06 NOTE — Telephone Encounter (Signed)
-----   Message from Nurse Roxie PARAS sent at 06/06/2024  3:03 PM EDT ----- Regarding: Establish for INR Monitoring Hello,  Patient needs an appointment to establish in Anticoag clinic for INR monitoring. Hx of aortic valve replacement. She currently has remote monitoring with a provider in [place]  until she establishes here.  Can we get her scheduled for an appointment to continue INR monitoring, please? She saw Dr. Okey for new patient appt today.  Thank you, Kristie

## 2024-06-06 NOTE — Patient Instructions (Signed)
 Medication Instructions:  Your physician recommends that you continue on your current medications as directed. Please refer to the Current Medication list given to you today.  *If you need a refill on your cardiac medications before your next appointment, please call your pharmacy*  Testing/Procedures: Your physician has requested that you have an echocardiogram. Echocardiography is a painless test that uses sound waves to create images of your heart. It provides your doctor with information about the size and shape of your heart and how well your heart's chambers and valves are working. This procedure takes approximately one hour. There are no restrictions for this procedure. Please do NOT wear cologne, perfume, aftershave, or lotions (deodorant is allowed). Please arrive 15 minutes prior to your appointment time.  Please note: We ask at that you not bring children with you during ultrasound (echo/ vascular) testing. Due to room size and safety concerns, children are not allowed in the ultrasound rooms during exams. Our front office staff cannot provide observation of children in our lobby area while testing is being conducted. An adult accompanying a patient to their appointment will only be allowed in the ultrasound room at the discretion of the ultrasound technician under special circumstances. We apologize for any inconvenience.  Follow-Up: At [facility], you and your health needs are our priority.  As part of our continuing mission to provide you with exceptional heart care, our providers are all part of one team.  This team includes your primary Cardiologist (physician) and Advanced Practice Providers or APPs (Physician Assistants and Nurse Practitioners) who all work together to provide you with the care you need, when you need it.  Your next appointment:   To be determined after echocardiogram results.  Provider:   Vina Gull, MD  We recommend signing up for the patient  portal called MyChart.  Sign up information is provided on this After Visit Summary.  MyChart is used to connect with patients for Virtual Visits (Telemedicine).  Patients are able to view lab/test results, encounter notes, upcoming appointments, etc.  Non-urgent messages can be sent to your provider as well.    To learn more about what you can do with MyChart, go to [email or web address].

## 2024-06-06 NOTE — Telephone Encounter (Signed)
 Lpmtcb to set up New Coumadin (with us ) appt per Dr Okey.

## 2024-06-06 NOTE — Progress Notes (Signed)
 Cardiology Office Note   Date:  06/06/2024   ID:  Miranda, Collins 28-Jan-1956, MRN [id number]  PCP:  Gil Greig BRAVO, NP  Cardiologist:   Vina Gull, MD   Pt presents to establish care  S/p AVR      History of Present Illness: Miranda Collins is a 68 y.o. female with a history of aortic stenosis, HTN, COPD She recently moved back to [facility] from [place].  Here to establish care after AVR  2017   Pt found to have mod AS during a preop evaluation for hip surgery  2020  TTE showed AS severe     LHC in Feb 2020  Normal coronary arteries  March 2020 pt underwent AVR (On-X 31 prosthesis)   Post op developed atrial fibrillation.  Rx amiodarone   This was complicated by N/V.  Switched to metoprolol then Toprol XL.  THis was d/c'd  The pt checks her INR at home, communicating with coumadin clinic in [facility] Goal for INR 1.5-2.      The pt denies CP  Breathing is good  No dizziness  NO palpitations  Walks some    Current Meds  Medication Sig   acetaminophen (TYLENOL) 325 MG tablet Take 325 mg by mouth as needed.   amoxicillin  (AMOXIL ) 500 MG capsule Take 2 capsules (1,000 mg total) by mouth once as needed for up to 1 dose.   ascorbic acid (VITAMIN C) 500 MG tablet Take 250 mg by mouth daily.   aspirin 81 MG chewable tablet Chew 81 mg by mouth daily.   Calcium-Vitamin D (CALTRATE 600 PLUS-VIT D PO) Take 600 mg by mouth every other day.   Cholecalciferol 50 MCG (2000 UT) CAPS Take 2,000 Units by mouth daily.   clobetasol cream (TEMOVATE) 0.05 % Apply 1 Application topically as needed.   cyanocobalamin (VITAMIN B12) 500 MCG tablet Take 500 mcg by mouth as needed.   hydrochlorothiazide (HYDRODIURIL) 25 MG tablet Take 25 mg by mouth daily.   loratadine (CLARITIN) 10 MG tablet Take 1 tablet by mouth daily.   mupirocin  ointment (BACTROBAN ) 2 % Apply 1 Application topically as needed.   Olopatadine HCl 0.2 % SOLN Apply 1 drop to eye daily.   ondansetron (ZOFRAN-ODT) 4 MG disintegrating tablet  Take 4 mg by mouth as needed.   triamcinolone ointment (KENALOG) 0.1 % Apply 1 Application topically as needed.   warfarin (COUMADIN) 2 MG tablet Take 4-5 mg by mouth daily.     Allergies:   Iodinated contrast media, Iodine, Metoprolol, Butorphanol tartrate, Iohexol, Latex, Origanum oil, and Tomato   Past Medical History:  Diagnosis Date   Allergic rhinitis 08/16/2007   Qualifier: Diagnosis of   By: Lilton CMA, Jacqualynn      IMO SNOMED Dx Update Oct 2024     Chronic anticoagulation 03/03/2024   Diastolic dysfunction 03/03/2024   Hypertension    Osteopenia 03/03/2024   Paroxysmal atrial fibrillation due to heart valve disorder (HCC) 03/03/2024   Severe aortic stenosis 03/03/2024    Past Surgical History:  Procedure Laterality Date   TOTAL HIP ARTHROPLASTY Bilateral      Social History:  The patient  reports that she quit smoking about 11 years ago. Her smoking use included cigarettes. She started smoking about 51 years ago. She has a 40 pack-year smoking history. She has never used smokeless tobacco. She reports current alcohol use of about 5.0 - 6.0 standard drinks of alcohol per week. She reports that she does not use  drugs.   Family History:  The patient's family history includes Atrial fibrillation in her mother; Lung cancer in her father; Sudden Cardiac Death in her mother.    ROS:  Please see the history of present illness. All other systems are reviewed and  Negative to the above problem except as noted.    PHYSICAL EXAM: VS:  BP 128/74   Pulse 98   Ht 5' 5.5 (1.664 m)   Wt 224 lb 9.6 oz (101.9 kg)   SpO2 97%   BMI 36.81 kg/m   GEN: Obese 68 yo in  no acute distress  HEENT: normal  Neck: no JVD, carotid bruits Cardiac: RRR; Crisp valve sounds  Respiratory:  clear to auscultation  GI: soft, nontender,  No hepatomegaly  Ext  No LE edema   2+ PT pulses   EKG:  EKG is ordered today.  NSR 98 bpm  Septal MI      Lipid Panel No results found for: CHOL,  TRIG, HDL, CHOLHDL, VLDL, LDLCALC, LDLDIRECT    Wt Readings from Last 3 Encounters:  06/06/24 224 lb 9.6 oz (101.9 kg)  05/04/24 224 lb (101.6 kg)  04/06/24 221 lb 3.4 oz (100.3 kg)      ASSESSMENT AND PLAN:  1  AS  Pt is s/p SAVR ( ON-X 21 prosthesis in 2020)   Doing well  Echo in 2024 showed prosthesis was working well      WOuld get an echo here to have as baseline now that she is living in [place] Will set up to be seen/followed by coumadin clinic   Keep  on 81 mg EcASA  2  HTN  BP is controlled   3  LIpids   LDL 91, HDL 61  Trig 53 in 2024     4  COPD  Pt with emphysema on CT scan    Moving air OK  Follow up next year   Current medicines are reviewed at length with the patient today.  The patient does not have concerns regarding medicines.  Signed, Vina Gull, MD  06/06/2024 2:35 PM    [facility] [address] [place], [place], [place]  [address] Phone: [phone]; Fax: [phone]

## 2024-06-06 NOTE — Telephone Encounter (Signed)
 I spoke to the patient and scheduled her for INR (New Coumadin) appt for 8/26 @ 10:30.  She would really like to continue home testing, but I told her that we do not offer that option and she needs to come to the office.  She will reach out to PCP to see if they would follow and let us  know.

## 2024-06-13 ENCOUNTER — Ambulatory Visit: Attending: Cardiology | Admitting: Pharmacist

## 2024-06-13 ENCOUNTER — Encounter (HOSPITAL_COMMUNITY): Payer: Self-pay

## 2024-06-13 DIAGNOSIS — Z952 Presence of prosthetic heart valve: Secondary | ICD-10-CM | POA: Diagnosis present

## 2024-06-13 DIAGNOSIS — Z7901 Long term (current) use of anticoagulants: Secondary | ICD-10-CM | POA: Insufficient documentation

## 2024-06-13 LAB — POCT INR: INR: 1.5 — AB (ref 2.0–3.0)

## 2024-06-13 NOTE — Patient Instructions (Addendum)
 Description   Continue warfarin 2 tablets (4mg ) daily except 2 and 1/2 tablet (5mg ) on Tuesdays and Fridays.  Please contact us  with any questions [phone]  Recheck in 5 weeks

## 2024-06-13 NOTE — Progress Notes (Signed)
 INR 1.5; Please see anticoagulation encounter   Description   Continue warfarin 2 tablets (4mg ) daily except 2 and 1/2 tablet (5mg ) on Tuesdays and Fridays.  Please contact us  with any questions [phone]  Recheck in 5 weeks

## 2024-06-14 ENCOUNTER — Ambulatory Visit (HOSPITAL_COMMUNITY): Payer: Self-pay | Admitting: Nurse Practitioner

## 2024-06-14 NOTE — Progress Notes (Signed)
 HVI Anticoagulation Encounter Note:     Rankin    Surgical AVR plan   Warfarin start date: 01-06-2019   HVI Staff: Rankin  DX : On-X AVR   INR Range: 1.5 - 2.0  Tab strength: warfarin 2mg    Contact 1: [phone] cell   Contact 2:   Lab: [facility] - [facility]   Comments: Tylenol  regularly    HVI anticoag SCN: Rico Pane RN ext [phone]       Spoke with patient. Stated that she has had her NPV with her local cardiologist and they are now managing her warfarin. Anticoagulation episode completed.    Rosaline Pane, RN SCN  Anticoag clinic  Ext. [phone]      For additional information, see dosing calendar below.

## 2024-07-11 ENCOUNTER — Ambulatory Visit (HOSPITAL_COMMUNITY)

## 2024-07-18 ENCOUNTER — Ambulatory Visit: Attending: Cardiology | Admitting: Pharmacist

## 2024-07-18 DIAGNOSIS — Z952 Presence of prosthetic heart valve: Secondary | ICD-10-CM | POA: Diagnosis present

## 2024-07-18 DIAGNOSIS — I38 Endocarditis, valve unspecified: Secondary | ICD-10-CM | POA: Diagnosis present

## 2024-07-18 DIAGNOSIS — I480 Paroxysmal atrial fibrillation: Secondary | ICD-10-CM | POA: Insufficient documentation

## 2024-07-18 DIAGNOSIS — Z7901 Long term (current) use of anticoagulants: Secondary | ICD-10-CM | POA: Insufficient documentation

## 2024-07-18 LAB — POCT INR: INR: 1.5 — AB (ref 2.0–3.0)

## 2024-07-18 NOTE — Patient Instructions (Signed)
 Description   Continue warfarin 2 tablets (4mg ) daily except 2 and 1/2 tablet (5mg ) on Tuesdays and Fridays.  Please contact us  with any questions [phone]  Recheck in 5 weeks

## 2024-07-18 NOTE — Progress Notes (Signed)
 Description   Continue warfarin 2 tablets (4mg ) daily except 2 and 1/2 tablet (5mg ) on Tuesdays and Fridays.  Please contact us  with any questions [phone]  Recheck in 5 weeks

## 2024-08-07 ENCOUNTER — Other Ambulatory Visit (HOSPITAL_COMMUNITY): Payer: Self-pay | Admitting: Nurse Practitioner

## 2024-08-07 DIAGNOSIS — Z952 Presence of prosthetic heart valve: Secondary | ICD-10-CM

## 2024-08-10 ENCOUNTER — Other Ambulatory Visit (HOSPITAL_COMMUNITY)

## 2024-08-11 ENCOUNTER — Ambulatory Visit: Admitting: Cardiology

## 2024-08-28 ENCOUNTER — Ambulatory Visit: Payer: Self-pay

## 2024-08-28 ENCOUNTER — Ambulatory Visit: Admitting: Pharmacist

## 2024-08-28 ENCOUNTER — Ambulatory Visit (HOSPITAL_COMMUNITY)
Admission: RE | Admit: 2024-08-28 | Discharge: 2024-08-28 | Disposition: A | Discharge status: Home / Self Care | Source: Ambulatory Visit | Attending: Cardiovascular Disease | Admitting: Cardiovascular Disease

## 2024-08-28 DIAGNOSIS — Z7901 Long term (current) use of anticoagulants: Secondary | ICD-10-CM | POA: Diagnosis present

## 2024-08-28 DIAGNOSIS — Z952 Presence of prosthetic heart valve: Secondary | ICD-10-CM | POA: Insufficient documentation

## 2024-08-28 LAB — ECHOCARDIOGRAM COMPLETE
AR max vel: 1.37 cm2
AV Area VTI: 1.68 cm2
AV Area mean vel: 1.43 cm2
AV Mean grad: 12 mmHg
AV Peak grad: 21 mmHg
Ao pk vel: 2.29 m/s
Area-P 1/2: 3.21 cm2
S' Lateral: 3.3 cm

## 2024-08-28 LAB — POCT INR: INR: 1.7 — AB (ref 2.0–3.0)

## 2024-08-28 MED ORDER — WARFARIN SODIUM 2 MG PO TABS: ORAL_TABLET | ORAL | 0 refills | Status: DC

## 2024-08-28 NOTE — Telephone Encounter (Signed)
 FYI Only or Action Required?: FYI only for provider: appointment scheduled on 11/11.  Patient was last seen in primary care on 05/04/2024 by Gil Greig BRAVO, NP.  Called Nurse Triage reporting Sore Throat.  Symptoms began several days ago.  Interventions attempted: Rest, hydration, or home remedies.  Symptoms are: unchanged.  Triage Disposition: See PCP When Office is Open (Within 3 Days)  Patient/caregiver understands and will follow disposition?: Yes  Reason for Disposition  [1] Sore throat is the only symptom AND [2] present > 48 hours  Answer Assessment - Initial Assessment Questions 1. ONSET: When did the throat start hurting? (Hours or days ago)      Saturday night 2. SEVERITY: How bad is the sore throat? (Scale 1-10; mild, moderate or severe)     5-6/10 3. STREP EXPOSURE: Has there been any exposure to strep within the past week? If Yes, ask: What type of contact occurred?      none 4.  VIRAL SYMPTOMS: Are there any symptoms of a cold, such as a runny nose, cough, hoarse voice or red eyes?      cough 5. FEVER: Do you have a fever? If Yes, ask: What is your temperature, how was it measured, and when did it start?     no 6. PUS ON THE TONSILS: Is there pus on the tonsils in the back of your throat?     Unsure- unable to see 7. OTHER SYMPTOMS: Do you have any other symptoms? (e.g., difficulty breathing, headache, rash)     headache  Protocols used: Sore Throat-A-AH

## 2024-08-28 NOTE — Telephone Encounter (Signed)
 Copied from CRM [id number]. Topic: Clinical - Red Word Triage >> Aug 28, 2024  8:57 AM Farrel B wrote: [facility] that prompted transfer to Nurse Triage: possible strep, headache, sore throat >> Aug 28, 2024  9:10 AM Suzette B wrote: Please call pt to advise. She stated she has to go over to the coumadin clinic for a non evasive procedure and is feeling really bad to the point she doesn't feel like getting out of bed. Please call to advise [phone]

## 2024-08-28 NOTE — Patient Instructions (Addendum)
 Description   INR 1.7: Continue warfarin 2 tablets (4mg ) daily except 2 and 1/2 tablet (5mg ) on Tuesdays and Fridays.  Please contact us  with any questions [phone]  Recheck in 5 weeks. Patient does not need print out.

## 2024-08-28 NOTE — Progress Notes (Signed)
 Description   INR 1.7: Continue warfarin 2 tablets (4mg ) daily except 2 and 1/2 tablet (5mg ) on Tuesdays and Fridays.  Please contact us  with any questions [phone]  Recheck in 5 weeks. Patient does not need print out.

## 2024-08-29 ENCOUNTER — Ambulatory Visit: Payer: Self-pay | Admitting: Internal Medicine

## 2024-08-29 ENCOUNTER — Ambulatory Visit: Admitting: Adult Health

## 2024-09-21 ENCOUNTER — Encounter: Payer: Self-pay | Admitting: Adult Health

## 2024-09-21 ENCOUNTER — Ambulatory Visit: Admitting: Adult Health

## 2024-09-21 VITALS — BP 128/66 | HR 77 | Temp 97.4°F | Ht 65.5 in | Wt 229.6 lb

## 2024-09-21 DIAGNOSIS — J019 Acute sinusitis, unspecified: Secondary | ICD-10-CM | POA: Diagnosis not present

## 2024-09-21 DIAGNOSIS — Z952 Presence of prosthetic heart valve: Secondary | ICD-10-CM

## 2024-09-21 DIAGNOSIS — B9689 Other specified bacterial agents as the cause of diseases classified elsewhere: Secondary | ICD-10-CM | POA: Diagnosis not present

## 2024-09-21 DIAGNOSIS — H6123 Impacted cerumen, bilateral: Secondary | ICD-10-CM | POA: Diagnosis not present

## 2024-09-21 DIAGNOSIS — I10 Essential (primary) hypertension: Secondary | ICD-10-CM | POA: Diagnosis not present

## 2024-09-21 MED ORDER — AMOXICILLIN 500 MG PO CAPS: 500.0000 mg | ORAL_CAPSULE | Freq: Three times a day (TID) | ORAL | 0 refills | Status: AC

## 2024-09-21 NOTE — Progress Notes (Signed)
 [facility] clinic  Provider:  Jereld Serum DNP  Code Status:  Full Code  Goals of Care:     05/04/2024   10:31 AM  Advanced Directives  Does Patient Have a Medical Advance Directive? Yes  Type of Estate Agent of [place];Living will  Does patient want to make changes to medical advance directive? No - Patient declined  Copy of Healthcare Power of Attorney in Chart? No - copy requested     Chief Complaint  Patient presents with   Common Cold    Mucus (some color with a streak of blood), cough, ears feel full. Ongoing for about a month    Discussed the use of AI scribe software for clinical note transcription with the patient, who gave verbal consent to proceed.  HPI: Patient is a 68 y.o. female seen today for an acute visit for    Past Medical History:  Diagnosis Date   Allergic rhinitis 08/16/2007   Qualifier: Diagnosis of   By: Lilton CMA, Jacqualynn      IMO SNOMED Dx Update Oct 2024     Chronic anticoagulation 03/03/2024   Diastolic dysfunction 03/03/2024   Hypertension    Osteopenia 03/03/2024   Paroxysmal atrial fibrillation due to heart valve disorder (HCC) 03/03/2024   Severe aortic stenosis 03/03/2024    Past Surgical History:  Procedure Laterality Date   TOTAL HIP ARTHROPLASTY Bilateral     Allergies  Allergen Reactions   Iodinated Contrast Media Anaphylaxis   Iodine Anaphylaxis   Metoprolol Dermatitis   Butorphanol Tartrate Other (See Comments)   Iohexol      Desc: hives- 08/04/05- asm    Latex     Added based on information entered during case entry, please review and add reactions, type, and severity as needed   Origanum Oil     Allergy to Italian/Greek seasoning Hives   Tomato Hives    Outpatient Encounter Medications as of 09/21/2024  Medication Sig   acetaminophen (TYLENOL) 325 MG tablet Take 325 mg by mouth as needed.   amoxicillin  (AMOXIL ) 500 MG capsule Take 1 capsule (500 mg total) by mouth 3 (three) times  daily for 7 days.   ascorbic acid (VITAMIN C) 500 MG tablet Take 250 mg by mouth daily.   aspirin 81 MG chewable tablet Chew 81 mg by mouth daily.   Cholecalciferol 50 MCG (2000 UT) CAPS Take 2,000 Units by mouth daily.   clobetasol cream (TEMOVATE) 0.05 % Apply 1 Application topically as needed.   cyanocobalamin (VITAMIN B12) 500 MCG tablet Take 500 mcg by mouth as needed.   hydrochlorothiazide (HYDRODIURIL) 25 MG tablet Take 25 mg by mouth daily.   loratadine (CLARITIN) 10 MG tablet Take 1 tablet by mouth daily.   mupirocin  ointment (BACTROBAN ) 2 % Apply 1 Application topically as needed.   Olopatadine HCl 0.2 % SOLN Apply 1 drop to eye daily.   triamcinolone ointment (KENALOG) 0.1 % Apply 1 Application topically as needed.   warfarin (COUMADIN ) 2 MG tablet Take 2-3 tablets by mouth once a day or as directed by Coumadin  Clinic   amoxicillin  (AMOXIL ) 500 MG capsule Take 2 capsules (1,000 mg total) by mouth once as needed for up to 1 dose. (Patient not taking: Reported on 09/21/2024)   Calcium-Vitamin D (CALTRATE 600 PLUS-VIT D PO) Take 600 mg by mouth every other day. (Patient not taking: Reported on 09/21/2024)   ondansetron (ZOFRAN-ODT) 4 MG disintegrating tablet Take 4 mg by mouth as needed. (Patient not taking: Reported  on 09/21/2024)   No facility-administered encounter medications on file as of 09/21/2024.    Review of Systems:  Review of Systems  Constitutional:  Negative for appetite change, chills, fatigue and fever.  HENT:  Positive for sinus pressure and sinus pain. Negative for congestion, hearing loss, rhinorrhea and sore throat.   Eyes: Negative.   Respiratory:  Positive for cough. Negative for shortness of breath and wheezing.   Cardiovascular:  Negative for chest pain, palpitations and leg swelling.  Gastrointestinal:  Negative for abdominal pain, constipation, diarrhea, nausea and vomiting.  Genitourinary:  Negative for dysuria.  Musculoskeletal:  Negative for arthralgias,  back pain and myalgias.  Skin:  Negative for color change, rash and wound.  Neurological:  Negative for dizziness, weakness and headaches.  Psychiatric/Behavioral:  Negative for behavioral problems. The patient is not nervous/anxious.     Health Maintenance  Topic Date Due   Lung Cancer Screening  Never done   Zoster Vaccines- Shingrix (1 of 2) 12/20/2024 (Originally 10/28/1974)   Medicare Annual Wellness (AWV)  05/04/2025   Mammogram  11/28/2025   DTaP/Tdap/Td (3 - Td or Tdap) 11/08/2028   Colonoscopy  05/14/2030   Pneumococcal Vaccine: 50+ Years  Completed   Influenza Vaccine  Completed   Bone Density Scan  Completed   Hepatitis C Screening  Completed   Meningococcal B Vaccine  Aged Out   COVID-19 Vaccine  Discontinued    Physical Exam: Vitals:   09/21/24 1302  BP: 128/66  Pulse: 77  Temp: (!) 97.4 F (36.3 C)  TempSrc: Temporal  SpO2: 98%  Weight: 229 lb 9.6 oz (104.1 kg)  Height: 5' 5.5 (1.664 m)   Body mass index is 37.63 kg/m. Physical Exam Constitutional:      Appearance: She is obese.  HENT:     Head: Normocephalic and atraumatic.     Right Ear: There is impacted cerumen.     Left Ear: There is impacted cerumen.     Nose: Nose normal.     Mouth/Throat:     Mouth: Mucous membranes are moist.  Eyes:     Conjunctiva/sclera: Conjunctivae normal.  Cardiovascular:     Rate and Rhythm: Normal rate and regular rhythm.  Pulmonary:     Effort: Pulmonary effort is normal.     Breath sounds: Normal breath sounds.  Abdominal:     General: Bowel sounds are normal.     Palpations: Abdomen is soft.  Musculoskeletal:        General: Normal range of motion.     Cervical back: Normal range of motion.  Skin:    General: Skin is warm and dry.  Neurological:     General: No focal deficit present.     Mental Status: She is alert and oriented to person, place, and time.  Psychiatric:        Mood and Affect: Mood normal.        Behavior: Behavior normal.         Thought Content: Thought content normal.        Judgment: Judgment normal.     Labs reviewed: Basic Metabolic Panel: Recent Labs    04/06/24 1134  TSH 1.66   Liver Function Tests: No results for input(s): AST, ALT, ALKPHOS, BILITOT, PROT, ALBUMIN in the last 8760 hours. No results for input(s): LIPASE, AMYLASE in the last 8760 hours. No results for input(s): AMMONIA in the last 8760 hours. CBC: Recent Labs    04/06/24 1134  WBC 5.7  NEUTROABS 3,471  HGB 14.5  HCT 44.2  MCV 92.5  PLT 292   Lipid Panel: No results for input(s): CHOL, HDL, LDLCALC, TRIG, CHOLHDL, LDLDIRECT in the last 8760 hours. No results found for: HGBA1C  Procedures since last visit: ECHOCARDIOGRAM COMPLETE Result Date: 08/28/2024    ECHOCARDIOGRAM REPORT   Patient Name:   OSMARA DRUMMONDS Date of Exam: 08/28/2024 Medical Rec #:  [id number]           Height:       65.5 in Accession #:    [id number]          Weight:       224.6 lb Date of Birth:  [birth year]           BSA:          2.090 m Patient Age:    68 years            BP:           173/94 mmHg Patient Gender: F                   HR:           78 bpm. Exam Location:  [address] Procedure: 2D Echo, 3D Echo, Cardiac Doppler and Color Doppler (Both Spectral            and Color Flow Doppler were utilized during procedure). Indications:    Z95.2 S/p AVR  History:        Patient has no prior history of Echocardiogram examinations.                 COPD, Arrythmias:Atrial Fibrillation; Risk Factors:Hypertension.                 Aortic Valve: ON-X 31 Prosthesis valve is present in the aortic                 position. Procedure Date: 12/2018.  Sonographer:    Waldo Guadalajara RCS Referring Phys: [id number] PAULA V ROSS IMPRESSIONS  1. Left ventricular ejection fraction, by estimation, is 60 to 65%. Left ventricular ejection fraction by 3D volume is 62 %. The left ventricle has normal function. The left ventricle has no regional wall motion  abnormalities. Left ventricular diastolic  parameters are consistent with Grade II diastolic dysfunction (pseudonormalization). Elevated left atrial pressure.  2. Right ventricular systolic function is normal. The right ventricular size is normal. Tricuspid regurgitation signal is inadequate for assessing PA pressure.  3. Left atrial size was mildly dilated.  4. The mitral valve is normal in structure. No evidence of mitral valve regurgitation. No evidence of mitral stenosis. Moderate to severe mitral annular calcification.  5. The aortic valve has been repaired/replaced. Aortic valve regurgitation is not visualized. There is a ON-X 31 Prosthesis valve present in the aortic position. Procedure Date: 12/2018. Aortic valve mean gradient measures 12.0 mmHg. Aortic valve Vmax measures 2.29 m/s. Aortic valve acceleration time measures 60 msec.  6. There is mild dilatation of the ascending aorta, measuring 40 mm.  7. The inferior vena cava is normal in size with greater than 50% respiratory variability, suggesting right atrial pressure of 3 mmHg. Comparison(s): No prior Echocardiogram. FINDINGS  Left Ventricle: Left ventricular ejection fraction, by estimation, is 60 to 65%. Left ventricular ejection fraction by 3D volume is 62 %. The left ventricle has normal function. The left ventricle has no regional wall motion abnormalities. The left ventricular internal cavity size was normal in size. There is no left  ventricular hypertrophy. Abnormal (paradoxical) septal motion consistent with post-operative status. Left ventricular diastolic parameters are consistent with Grade II diastolic dysfunction (pseudonormalization). Elevated left atrial pressure. Right Ventricle: The right ventricular size is normal. No increase in right ventricular wall thickness. Right ventricular systolic function is normal. Tricuspid regurgitation signal is inadequate for assessing PA pressure. Left Atrium: Left atrial size was mildly dilated. Right  Atrium: Right atrial size was normal in size. Pericardium: There is no evidence of pericardial effusion. Mitral Valve: The mitral valve is normal in structure. Moderate to severe mitral annular calcification. No evidence of mitral valve regurgitation. No evidence of mitral valve stenosis. Tricuspid Valve: The tricuspid valve is normal in structure. Tricuspid valve regurgitation is not demonstrated. Aortic Valve: The aortic valve has been repaired/replaced. Aortic valve regurgitation is not visualized. Aortic valve mean gradient measures 12.0 mmHg. Aortic valve peak gradient measures 21.0 mmHg. Aortic valve area, by VTI measures 1.68 cm. There is a  ON-X 31 Prosthesis valve present in the aortic position. Procedure Date: 12/2018. Pulmonic Valve: The pulmonic valve was not well visualized. Pulmonic valve regurgitation is not visualized. No evidence of pulmonic stenosis. Aorta: The aortic root is normal in size and structure. There is mild dilatation of the ascending aorta, measuring 40 mm. Venous: The inferior vena cava is normal in size with greater than 50% respiratory variability, suggesting right atrial pressure of 3 mmHg. IAS/Shunts: No atrial level shunt detected by color flow Doppler. Additional Comments: 3D was performed not requiring image post processing on an independent workstation and was normal.  LEFT VENTRICLE PLAX 2D LVIDd:         4.50 cm         Diastology LVIDs:         3.30 cm         LV e' medial:    8.81 cm/s LV PW:         1.00 cm         LV E/e' medial:  13.3 LV IVS:        1.20 cm         LV e' lateral:   4.90 cm/s LVOT diam:     2.00 cm         LV E/e' lateral: 23.9 LV SV:         81 LV SV Index:   39 LVOT Area:     3.14 cm        3D Volume EF LV IVRT:       130 msec        LV 3D EF:    Left                                             ventricul                                             ar                                             ejection  fraction                                             by 3D                                             volume is                                             62 %.                                 3D Volume EF:                                3D EF:        62 %                                LV EDV:       78 ml                                LV ESV:       30 ml                                LV SV:        49 ml RIGHT VENTRICLE RV Basal diam:  3.70 cm     PULMONARY VEINS RV S prime:     11.70 cm/s  A Reversal Velocity: 34.20 cm/s TAPSE (M-mode): 1.2 cm      Diastolic Velocity:  42.90 cm/s                             S/D Velocity:        1.20                             Systolic Velocity:   50.20 cm/s LEFT ATRIUM             Index        RIGHT ATRIUM           Index LA diam:        4.40 cm 2.11 cm/m   RA Area:     13.90 cm LA Vol (A2C):   71.4 ml 34.16 ml/m  RA Volume:   35.30 ml  16.89 ml/m LA Vol (A4C):   51.9 ml 24.83 ml/m LA Biplane Vol: 61.6 ml 29.47 ml/m  AORTIC VALVE AV Area (Vmax):    1.37 cm AV Area (Vmean):   1.43 cm AV Area (VTI):     1.68 cm AV Vmax:           229.00 cm/s AV Vmean:  162.000 cm/s AV VTI:            0.484 m AV Peak Grad:      21.0 mmHg AV Mean Grad:      12.0 mmHg LVOT Vmax:         99.80 cm/s LVOT Vmean:        73.900 cm/s LVOT VTI:          0.259 m LVOT/AV VTI ratio: 0.54  AORTA Ao Root diam: 3.00 cm Ao Asc diam:  4.00 cm MITRAL VALVE MV Area (PHT):              SHUNTS MV Decel Time:              Systemic VTI:  0.26 m MV E velocity: 117.00 cm/s  Systemic Diam: 2.00 cm MV A velocity: 125.00 cm/s MV E/A ratio:  0.94 Mihai Croitoru MD Electronically signed by Jerel Balding MD Signature Date/Time: 08/28/2024/3:38:03 PM    Final     Assessment/Plan  1. Acute bacterial rhinosinusitis (Primary) -  Sinus pressure, nasal congestion, cough with yellow phlegm, occasional blood-tinged mucus. No fever. Amoxicillin  preferred due to previous rash with Augmentin. INR monitoring  necessary due to Coumadin  interaction. - Prescribed amoxicillin  500 mg every 8 hours for 7 days. - Advised INR monitoring every 3 days while on antibiotics. - amoxicillin  (AMOXIL ) 500 MG capsule; Take 1 capsule (500 mg total) by mouth 3 (three) times daily for 7 days.  Dispense: 21 capsule; Refill: 0  2. Primary hypertension -  Managed with hydrochlorothiazide 25 mg daily. Occasional swelling with high salt intake. -  BP 128/66, stable - Continue hydrochlorothiazide 25 mg daily. - Advised reducing salt intake to prevent swelling.  3. History of aortic valve replacement -  Chronic anticoagulation with Coumadin . INR levels consistent, recent readings 1.5 to 1.7. Home INR monitoring equipment available. - Continue regular INR monitoring at the Coumadin  clinic. - Advised checking INR every 3 days while on antibiotics. - Ensured home INR monitoring equipment is available.   4. Bilateral impacted cerumen -  Bilateral impacted cerumen with dry wax in left ear and moist wax in right ear. Moderate amount of earwax removed without pain or bleeding. - Flushed both ears to remove impacted cerumen. - Advised use of ear drops if needed for dryness.    Labs/tests ordered:  None   Return if symptoms worsen or fail to improve.  Darianny Momon Medina-Vargas, NP

## 2024-10-02 ENCOUNTER — Other Ambulatory Visit: Payer: Self-pay

## 2024-10-02 ENCOUNTER — Ambulatory Visit

## 2024-10-02 DIAGNOSIS — Z7901 Long term (current) use of anticoagulants: Secondary | ICD-10-CM | POA: Insufficient documentation

## 2024-10-02 DIAGNOSIS — Z952 Presence of prosthetic heart valve: Secondary | ICD-10-CM

## 2024-10-02 LAB — POCT INR: INR: 1.5 — AB (ref 2.0–3.0)

## 2024-10-02 MED ORDER — WARFARIN SODIUM 2 MG PO TABS: ORAL_TABLET | ORAL | 3 refills | Status: AC

## 2024-10-02 NOTE — Patient Instructions (Signed)
 Continue warfarin 2 tablets (4mg ) daily except 2 and 1/2 tablet (5mg ) on Tuesdays and Fridays.  Please contact us  with any questions [phone]  Recheck in 6 weeks. Patient does not need print out.

## 2024-10-02 NOTE — Progress Notes (Signed)
 INR 1.5 Please see anticoagulation encounter Continue warfarin 2 tablets (4mg ) daily except 2 and 1/2 tablet (5mg ) on Tuesdays and Fridays.  Please contact us  with any questions [phone]  Recheck in 6 weeks. Patient does not need print out.

## 2024-10-05 ENCOUNTER — Ambulatory Visit: Admitting: Orthopedic Surgery

## 2024-10-26 ENCOUNTER — Encounter: Payer: Self-pay | Admitting: Orthopedic Surgery

## 2024-10-26 ENCOUNTER — Ambulatory Visit (INDEPENDENT_AMBULATORY_CARE_PROVIDER_SITE_OTHER): Payer: Self-pay | Admitting: Orthopedic Surgery

## 2024-10-26 VITALS — BP 128/88 | HR 85 | Temp 97.6°F | Ht 65.5 in | Wt 230.6 lb

## 2024-10-26 DIAGNOSIS — Z122 Encounter for screening for malignant neoplasm of respiratory organs: Secondary | ICD-10-CM | POA: Diagnosis not present

## 2024-10-26 DIAGNOSIS — Z952 Presence of prosthetic heart valve: Secondary | ICD-10-CM | POA: Diagnosis not present

## 2024-10-26 DIAGNOSIS — E66812 Obesity, class 2: Secondary | ICD-10-CM | POA: Diagnosis not present

## 2024-10-26 DIAGNOSIS — Z6837 Body mass index (BMI) 37.0-37.9, adult: Secondary | ICD-10-CM

## 2024-10-26 DIAGNOSIS — H6123 Impacted cerumen, bilateral: Secondary | ICD-10-CM

## 2024-10-26 DIAGNOSIS — F17210 Nicotine dependence, cigarettes, uncomplicated: Secondary | ICD-10-CM | POA: Diagnosis not present

## 2024-10-26 DIAGNOSIS — M25522 Pain in left elbow: Secondary | ICD-10-CM | POA: Diagnosis not present

## 2024-10-26 DIAGNOSIS — E662 Morbid (severe) obesity with alveolar hypoventilation: Secondary | ICD-10-CM

## 2024-10-26 DIAGNOSIS — Z7901 Long term (current) use of anticoagulants: Secondary | ICD-10-CM

## 2024-10-26 DIAGNOSIS — I38 Endocarditis, valve unspecified: Secondary | ICD-10-CM | POA: Diagnosis not present

## 2024-10-26 DIAGNOSIS — I10 Essential (primary) hypertension: Secondary | ICD-10-CM | POA: Diagnosis not present

## 2024-10-26 DIAGNOSIS — I480 Paroxysmal atrial fibrillation: Secondary | ICD-10-CM

## 2024-10-26 MED ORDER — HYDROCHLOROTHIAZIDE 25 MG PO TABS: 25.0000 mg | ORAL_TABLET | Freq: Every day | ORAL | 2 refills | Status: AC

## 2024-10-26 MED ORDER — PREDNISONE 20 MG PO TABS: ORAL_TABLET | ORAL | 0 refills | Status: AC

## 2024-10-26 NOTE — Patient Instructions (Addendum)
 Switch to Zyrtec for allergies ear fullness> switch for at least 1 month then go back to Claritin  [facility]> [phone]  Debrox 5 gtts to both ears> do at night and put cotton round to keep medication in> do at least 5 days> no improvement schedule ear flush   Schedule CT chest to rule out lung cancer with [facility]

## 2024-10-26 NOTE — Progress Notes (Signed)
 "   Careteam: Patient Care Team: Gil Greig BRAVO, NP as PCP - General (Adult Health Nurse Practitioner) Okey Vina GAILS, MD as PCP - Cardiology (Cardiology)  Seen by: Greig Gil, AGNP-C  PLACE OF SERVICE:  [facility]  Advanced Directive information    Allergies[1]  Chief Complaint  Patient presents with   Follow-up    6 month follow up Patient has concern about pain in left elbow. Patient states the pain has been going on for a month. She states pain level is 7 or 8 while moving and 3 at rest. Patient states she also feels like her left ear is stopped up.     HPI: Patient is a 69 y.o. female seen today for medical management of chronic conditions.   Discussed the use of AI scribe software for clinical note transcription with the patient, who gave verbal consent to proceed.  History of Present Illness   ALESSIA GONSALEZ is a 69 year old female with atrial fibrillation and hypertension who presents with elbow pain.  She has been experiencing elbow pain for about a month, which began before Christmas. The pain is described as a constant soreness, particularly exacerbated by lifting objects, with a pain level of 5-7 out of 10. There is no history of specific injury to the elbow. The pain worsens with movement but is mildly present at rest.   She has a history of ear fullness, particularly in the right ear, which she attributes to sleeping on her right side. She has been using Debrox after a previous ear cleaning and completed a course of amoxicillin  for a sinus infection in early December. She has been on Claritin for allergies since the early 2000s but is considering switching to Zyrtec as she feels the Claritin is no longer effective.   She has a history of aortic valve replacement and follows up regularly with cardiology. Her last echocardiogram in November showed normal heart function and a well-functioning valve prosthesis. She continues to follow coumadin  clinic. Denies abnormal  bleeding.   She quit smoking in 2013 after smoking a pack a day for 25 years. She is due for a CT scan of the chest, which she has not scheduled since moving from [place] .  She reports some weight gain since moving to [place], attributing it to lifestyle changes and holiday eating. She is mindful of her diet and is engaging in physical activities like line dancing to stay active.   She has a history of skin issues, including a past skin cancer diagnosis on her face during COVID, and is considering seeing a dermatologist for a new skin concern.       Review of Systems:  Review of Systems  Constitutional: Negative.   HENT:  Positive for congestion and hearing loss. Negative for ear discharge, ear pain and tinnitus.   Eyes: Negative.   Respiratory: Negative.    Cardiovascular: Negative.   Gastrointestinal: Negative.   Genitourinary: Negative.   Musculoskeletal:  Positive for joint pain. Negative for falls.       Left elbow pain  Skin: Negative.   Neurological: Negative.   Psychiatric/Behavioral: Negative.      Past Medical History:  Diagnosis Date   Allergic rhinitis 08/16/2007   Qualifier: Diagnosis of   By: Lilton CMA, Jacqualynn      IMO SNOMED Dx Update Oct 2024     Chronic anticoagulation 03/03/2024   Diastolic dysfunction 03/03/2024   Hypertension    Osteopenia 03/03/2024   Paroxysmal atrial fibrillation  due to heart valve disorder (HCC) 03/03/2024   Severe aortic stenosis 03/03/2024   Past Surgical History:  Procedure Laterality Date   TOTAL HIP ARTHROPLASTY Bilateral    Social History:   reports that she quit smoking about 12 years ago. Her smoking use included cigarettes. She started smoking about 52 years ago. She has a 40 pack-year smoking history. She has never used smokeless tobacco. She reports current alcohol use of about 5.0 - 6.0 standard drinks of alcohol per week. She reports that she does not use drugs.  Family History  Problem Relation Age of  Onset   Atrial fibrillation Mother    Sudden Cardiac Death Mother    Lung cancer Father     Medications: Patient's Medications  New Prescriptions   No medications on file  Previous Medications   ACETAMINOPHEN (TYLENOL) 325 MG TABLET    Take 325 mg by mouth as needed.   AMOXICILLIN  (AMOXIL ) 500 MG CAPSULE    Take 2 capsules (1,000 mg total) by mouth once as needed for up to 1 dose.   ASCORBIC ACID (VITAMIN C) 500 MG TABLET    Take 250 mg by mouth daily.   ASPIRIN 81 MG CHEWABLE TABLET    Chew 81 mg by mouth daily.   CALCIUM-VITAMIN D (CALTRATE 600 PLUS-VIT D PO)    Take 600 mg by mouth every other day.   CHOLECALCIFEROL 50 MCG (2000 UT) CAPS    Take 2,000 Units by mouth daily.   CLOBETASOL CREAM (TEMOVATE) 0.05 %    Apply 1 Application topically as needed.   CYANOCOBALAMIN (VITAMIN B12) 500 MCG TABLET    Take 500 mcg by mouth as needed.   HYDROCHLOROTHIAZIDE  (HYDRODIURIL ) 25 MG TABLET    Take 25 mg by mouth daily.   LORATADINE (CLARITIN) 10 MG TABLET    Take 1 tablet by mouth daily.   MUPIROCIN  OINTMENT (BACTROBAN ) 2 %    Apply 1 Application topically as needed.   OLOPATADINE HCL 0.2 % SOLN    Apply 1 drop to eye daily.   ONDANSETRON (ZOFRAN-ODT) 4 MG DISINTEGRATING TABLET    Take 4 mg by mouth as needed.   TRIAMCINOLONE OINTMENT (KENALOG) 0.1 %    Apply 1 Application topically as needed.   WARFARIN (COUMADIN ) 2 MG TABLET    Take 2-3 tablets by mouth once a day or as directed by Coumadin  Clinic  Modified Medications   No medications on file  Discontinued Medications   No medications on file    Physical Exam:  Vitals:   10/26/24 1107  BP: 128/88  Pulse: 85  Temp: 97.6 F (36.4 C)  SpO2: 95%  Weight: 230 lb 9.6 oz (104.6 kg)  Height: 5' 5.5 (1.664 m)   Body mass index is 37.79 kg/m. Wt Readings from Last 3 Encounters:  10/26/24 230 lb 9.6 oz (104.6 kg)  09/21/24 229 lb 9.6 oz (104.1 kg)  06/06/24 224 lb 9.6 oz (101.9 kg)    Physical Exam Vitals reviewed.   Constitutional:      General: She is not in acute distress. HENT:     Head: Normocephalic.     Right Ear: There is impacted cerumen.     Left Ear: There is impacted cerumen.     Nose: Nose normal.     Mouth/Throat:     Mouth: Mucous membranes are moist.  Eyes:     General:        Right eye: No discharge.  Left eye: No discharge.  Cardiovascular:     Rate and Rhythm: Normal rate and regular rhythm.     Pulses: Normal pulses.     Heart sounds: Normal heart sounds.  Pulmonary:     Effort: Pulmonary effort is normal.     Breath sounds: Normal breath sounds.  Abdominal:     General: Bowel sounds are normal. There is no distension.     Palpations: Abdomen is soft.     Tenderness: There is no abdominal tenderness.  Musculoskeletal:     Cervical back: Neck supple.     Right lower leg: No edema.     Left lower leg: No edema.  Skin:    General: Skin is warm.     Capillary Refill: Capillary refill takes less than 2 seconds.  Neurological:     General: No focal deficit present.     Mental Status: She is alert and oriented to person, place, and time.  Psychiatric:        Mood and Affect: Mood normal.     Labs reviewed: Basic Metabolic Panel: Recent Labs    04/06/24 1134  TSH 1.66   Liver Function Tests: No results for input(s): AST, ALT, ALKPHOS, BILITOT, PROT, ALBUMIN in the last 8760 hours. No results for input(s): LIPASE, AMYLASE in the last 8760 hours. No results for input(s): AMMONIA in the last 8760 hours. CBC: Recent Labs    04/06/24 1134  WBC 5.7  NEUTROABS 3,471  HGB 14.5  HCT 44.2  MCV 92.5  PLT 292   Lipid Panel: No results for input(s): CHOL, HDL, LDLCALC, TRIG, CHOLHDL, LDLDIRECT in the last 8760 hours. TSH: Recent Labs    04/06/24 1134  TSH 1.66   A1C: No results found for: HGBA1C   Assessment/Plan 1. Left elbow pain (Primary) - onset x 1 month - pain worse with  movement  - FROM  - predniSONE   (DELTASONE ) 20 MG tablet; Take 2 tablets (40 mg total) by mouth daily with breakfast for 5 days, THEN 1 tablet (20 mg total) daily with breakfast for 5 days.  Dispense: 15 tablet; Refill: 0  2. Primary hypertension - controlled, goal < 150/90 - BUN/creat 13/0.74 01/04/2024 - hydrochlorothiazide  (HYDRODIURIL ) 25 MG tablet; Take 1 tablet (25 mg total) by mouth daily.  Dispense: 90 tablet; Refill: 2 - CBC with Differential/Platelet - Basic Metabolic Panel with eGFR  3. Bilateral impacted cerumen - 12/04> debrox prescribed - hard was in both ear canals today - recommend Debrox 5 gtts to both ears at bedtime - no improvement schedule ear lavage  4. History of aortic valve replacement - followed by cardiology - EF 60-65%, regurgitation not visualized  - INR 1.5 10/02/2024 - cont coumadin  and coumadin  clinic visits  5. Class 2 obesity with alveolar hypoventilation, serious comorbidity, and body mass index (BMI) of 37.0 to 37.9 in adult (HCC) - BMI 37.79 - weight increased 6 lbs since 05/2024 - admits to holiday eating  - recommend < 1500 calories/day - recommend 150 minutes exercise/week - Basic Metabolic Panel with eGFR  6. Encounter for screening for lung cancer - CT CHEST LUNG CA SCREEN LOW DOSE W/O CM; Future  7. Smoking greater than 20 pack years - quit 2013 - asymptomatic - CT CHEST LUNG CA SCREEN LOW DOSE W/O CM; Future  8. Paroxysmal atrial fibrillation due to heart valve disorder (HCC) - HR< 100 without medication - cont coumadin  - Basic Metabolic Panel with eGFR  9. Long term current use of anticoagulant -  see above   Total time: 35 minutes. Greater than 50% of total time spent doing patient education regarding elbow pain, cerumen impaction, HTN, aortic valve replacement, lung cancer prevention, weight gain including symptom/medication management.     Next appt: Visit date not found  Rayshawn Visconti Gil BODILY  [facility] [phone]       [1]  Allergies Allergen Reactions   Iodinated Contrast Media Anaphylaxis   Iodine Anaphylaxis   Metoprolol Dermatitis   Butorphanol Tartrate Other (See Comments)   Iohexol      Desc: hives- 08/04/05- asm    Latex     Added based on information entered during case entry, please review and add reactions, type, and severity as needed   Origanum Oil     Allergy to Italian/Greek seasoning Hives   Tomato Hives   "

## 2024-10-27 ENCOUNTER — Ambulatory Visit: Payer: Self-pay | Admitting: Orthopedic Surgery

## 2024-10-27 LAB — BASIC METABOLIC PANEL WITHOUT GFR
BUN: 16 mg/dL (ref 7–25)
CO2: 28 mmol/L (ref 20–32)
Calcium: 8.8 mg/dL (ref 8.6–10.4)
Chloride: 98 mmol/L (ref 98–110)
Creat: 0.62 mg/dL (ref 0.50–1.05)
Glucose, Bld: 82 mg/dL (ref 65–139)
Potassium: 4 mmol/L (ref 3.5–5.3)
Sodium: 132 mmol/L — ABNORMAL LOW (ref 135–146)

## 2024-10-27 LAB — CBC WITH DIFFERENTIAL/PLATELET
Absolute Lymphocytes: 1683 {cells}/uL (ref 850–3900)
Absolute Monocytes: 535 {cells}/uL (ref 200–950)
Basophils Absolute: 33 {cells}/uL (ref 0–200)
Basophils Relative: 0.5 %
Eosinophils Absolute: 257 {cells}/uL (ref 15–500)
Eosinophils Relative: 3.9 %
HCT: 42.5 % (ref 35.9–46.0)
Hemoglobin: 14.1 g/dL (ref 11.7–15.5)
MCH: 30.1 pg (ref 27.0–33.0)
MCHC: 33.2 g/dL (ref 31.6–35.4)
MCV: 90.6 fL (ref 81.4–101.7)
MPV: 9.2 fL (ref 7.5–12.5)
Monocytes Relative: 8.1 %
Neutro Abs: 4092 {cells}/uL (ref 1500–7800)
Neutrophils Relative %: 62 %
Platelets: 279 Thousand/uL (ref 140–400)
RBC: 4.69 Million/uL (ref 3.80–5.10)
RDW: 11.8 % (ref 11.0–15.0)
Total Lymphocyte: 25.5 %
WBC: 6.6 Thousand/uL (ref 3.8–10.8)

## 2024-11-01 ENCOUNTER — Telehealth: Payer: Self-pay

## 2024-11-01 ENCOUNTER — Encounter: Payer: Self-pay | Admitting: Family

## 2024-11-01 ENCOUNTER — Ambulatory Visit (INDEPENDENT_AMBULATORY_CARE_PROVIDER_SITE_OTHER): Admitting: Family

## 2024-11-01 VITALS — BP 152/84 | HR 82 | Temp 97.8°F | Ht 65.5 in | Wt 230.8 lb

## 2024-11-01 DIAGNOSIS — H6123 Impacted cerumen, bilateral: Secondary | ICD-10-CM

## 2024-11-01 NOTE — Telephone Encounter (Signed)
 Patient is scheduled to be seen at 9:40

## 2024-11-01 NOTE — Telephone Encounter (Signed)
 Copied from CRM #[id number]. Topic: Appointments - Appointment Scheduling >> Nov 01, 2024  8:48 AM Diannia H wrote: Patient/patient representative is calling to schedule an appointment. She is asking to be added for today to get her ears flushed. Could you assist? Patients callback number is [phone].

## 2024-11-01 NOTE — Progress Notes (Signed)
 "  Provider: Tiya Schrupp FNP-C  Gil Greig BRAVO, NP  Patient Care Team: Gil Greig BRAVO, NP as PCP - General (Adult Health Nurse Practitioner) Okey Vina GAILS, MD as PCP - Cardiology (Cardiology)  Extended Emergency Contact Information Primary Emergency Contact: Noe,Clay  [place] Mobile Phone: [phone] Relation: Son  Code Status:  Full Code  Goals of care: Advanced Directive information    05/04/2024   10:31 AM  Advanced Directives  Does Patient Have a Medical Advance Directive? Yes  Type of Estate Agent of [place];Living will  Does patient want to make changes to medical advance directive? No - Patient declined  Copy of Healthcare Power of Attorney in Chart? No - copy requested     Chief Complaint  Patient presents with   ear lavage    Patient presents for b/l ear lavage.     History of Present Illness   Miranda Collins is a 69 year old female with a history of earwax buildup who presents with clogged ears for bilateral ear lavage. She was seen here by her PCP Amy Fargo,NP was advised to instill Debrox 5 drops twice daily x 1 week then follow up for ear lavage today.  She used ear drops for five days to soften earwax, as previously advised. CMA has lavaged both ears today. She now feels her ears are clogged, attributing this to water being trapped behind the wax. This has been a lifelong issue, and she recalls being told she has small ear canals, which may contribute to the problem.  No ear pain or bleeding is present. No fever or chills are reported. There is a little tenderness on the right side of her sinuses, but no pain on her forehead.  She is currently taking hydrochlorothiazide  for hypertension. Her blood pressure was slightly elevated during the visit, possibly due to being in a rush. She has an automatic blood pressure monitor at home but finds it challenging to use.  She usually cooks at home and is mindful of her  salt intake due to her hypertension.   Past Medical History:  Diagnosis Date   Allergic rhinitis 08/16/2007   Qualifier: Diagnosis of   By: Lilton CMA, Jacqualynn      IMO SNOMED Dx Update Oct 2024     Chronic anticoagulation 03/03/2024   Diastolic dysfunction 03/03/2024   Hypertension    Osteopenia 03/03/2024   Paroxysmal atrial fibrillation due to heart valve disorder (HCC) 03/03/2024   Severe aortic stenosis 03/03/2024   Past Surgical History:  Procedure Laterality Date   TOTAL HIP ARTHROPLASTY Bilateral     Allergies[1]  Outpatient Encounter Medications as of 11/01/2024  Medication Sig   acetaminophen (TYLENOL) 325 MG tablet Take 325 mg by mouth as needed.   amoxicillin  (AMOXIL ) 500 MG capsule Take 2 capsules (1,000 mg total) by mouth once as needed for up to 1 dose.   ascorbic acid (VITAMIN C) 500 MG tablet Take 250 mg by mouth daily.   aspirin 81 MG chewable tablet Chew 81 mg by mouth daily.   Cholecalciferol 50 MCG (2000 UT) CAPS Take 2,000 Units by mouth daily.   clobetasol cream (TEMOVATE) 0.05 % Apply 1 Application topically as needed.   cyanocobalamin (VITAMIN B12) 500 MCG tablet Take 500 mcg by mouth as needed.   hydrochlorothiazide  (HYDRODIURIL ) 25 MG tablet Take 1 tablet (25 mg total) by mouth daily.   loratadine (CLARITIN) 10 MG tablet Take 1 tablet by mouth daily.   mupirocin  ointment (BACTROBAN )  2 % Apply 1 Application topically as needed.   Olopatadine HCl 0.2 % SOLN Apply 1 drop to eye daily.   predniSONE  (DELTASONE ) 20 MG tablet Take 2 tablets (40 mg total) by mouth daily with breakfast for 5 days, THEN 1 tablet (20 mg total) daily with breakfast for 5 days.   triamcinolone ointment (KENALOG) 0.1 % Apply 1 Application topically as needed.   warfarin (COUMADIN ) 2 MG tablet Take 2-3 tablets by mouth once a day or as directed by Coumadin  Clinic   No facility-administered encounter medications on file as of 11/01/2024.    Review of Systems  Constitutional:   Negative for appetite change, chills, fatigue, fever and unexpected weight change.  HENT:  Negative for congestion, dental problem, ear discharge, ear pain, hearing loss, nosebleeds, postnasal drip, rhinorrhea, sinus pressure, sinus pain, sneezing, sore throat and tinnitus.   Eyes:  Negative for pain, discharge, redness, itching and visual disturbance.  Respiratory:  Negative for cough, chest tightness, shortness of breath and wheezing.   Cardiovascular:  Negative for chest pain, palpitations and leg swelling.  Genitourinary:  Negative for difficulty urinating, dysuria, flank pain, frequency and urgency.  Musculoskeletal:  Negative for arthralgias, back pain, gait problem, joint swelling, myalgias, neck pain and neck stiffness.  Skin:  Negative for color change, pallor, rash and wound.  Neurological:  Negative for dizziness, syncope, speech difficulty, weakness, light-headedness, numbness and headaches.    Immunization History  Administered Date(s) Administered   Fluad Trivalent(High Dose 65+) 08/26/2020   Influenza, Seasonal, Injecte, Preservative Fre 07/11/2019, 07/15/2021   Influenza-Unspecified 08/03/2024   PFIZER(Purple Top)SARS-COV-2 Vaccination 12/13/2019, 01/03/2020, 08/26/2020   Pfizer Covid-19 Vaccine Bivalent Booster 38yrs & up 07/15/2021   Pneumococcal Conjugate (Pcv15) 02/12/2022   Pneumococcal Polysaccharide-23 01/13/2021   Td 10/19/1998   Tdap 11/08/2018   Pertinent  Health Maintenance Due  Topic Date Due   Mammogram  11/28/2025   Colonoscopy  05/14/2030   Influenza Vaccine  Completed   Bone Density Scan  Completed      03/02/2024   10:09 AM 05/04/2024   10:27 AM 09/21/2024   12:57 PM 10/26/2024   11:02 AM 11/01/2024    9:48 AM  Fall Risk  Falls in the past year? 0 0 0 0 0  Was there an injury with Fall? 0  0  0 0 0  Fall Risk Category Calculator 0 0 0 0 0  Patient at Risk for Falls Due to No Fall Risks History of fall(s) No Fall Risks No Fall Risks No Fall Risks   Fall risk Follow up Falls evaluation completed Falls evaluation completed Falls evaluation completed Falls evaluation completed Falls evaluation completed     Data saved with a previous flowsheet row definition   Functional Status Survey:    Vitals:   11/01/24 0948 11/01/24 1017  BP: (!) 142/98 (!) 152/84  Pulse: 82   Temp: 97.8 F (36.6 C)   SpO2: 98%   Weight: 230 lb 12.8 oz (104.7 kg)   Height: 5' 5.5 (1.664 m)    Body mass index is 37.82 kg/m. Physical Exam VITALS: T- 97.8, P- 82, BP- 150/98, SaO2- 98% GENERAL: Alert, cooperative, well developed, no acute distress HEENT: Normocephalic, normal oropharynx, moist mucous membranes, eardrums normal bilaterally, right ear canal with erythema, nose normal, right sinus tenderness NECK: No cervical lymphadenopathy CHEST: Clear to auscultation bilaterally, no wheezes, rhonchi, or crackles CARDIOVASCULAR: Normal heart rate and rhythm, S1 and S2 normal without murmurs EXTREMITIES: No cyanosis or edema NEUROLOGICAL: Cranial nerves  grossly intact, moves all extremities without gross motor or sensory deficit    Labs reviewed: Recent Labs    10/26/24 1205  NA 132*  K 4.0  CL 98  CO2 28  GLUCOSE 82  BUN 16  CREATININE 0.62  CALCIUM 8.8   No results for input(s): AST, ALT, ALKPHOS, BILITOT, PROT, ALBUMIN in the last 8760 hours. Recent Labs    04/06/24 1134 10/26/24 1205  WBC 5.7 6.6  NEUTROABS 3,471 4,092  HGB 14.5 14.1  HCT 44.2 42.5  MCV 92.5 90.6  PLT 292 279   Lab Results  Component Value Date   TSH 1.66 04/06/2024   No results found for: HGBA1C No results found for: CHOL, HDL, LDLCALC, LDLDIRECT, TRIG, CHOLHDL  Significant Diagnostic Results in last 30 days:  No results found.  Assessment/Plan  Impacted cerumen, bilateral Bilateral impacted cerumen with residual water in the ear canals. No pain or bleeding. Right ear canal shows a small red area where wax was removed, but no  bleeding. Water is expected to dry up naturally due to narrow ear canals. - Advised to monitor for pain, fever, or chills. - Instructed to use ear drops if wax reaccumulates.  Hypertension Blood pressure readings elevated at 142/98 and 150/unknown. Currently on hydrochlorothiazide . Discussed lifestyle modifications including diet and exercise to manage blood pressure. Emphasized the importance of monitoring blood pressure at home to assess medication efficacy and need for additional treatment. - Provided blood pressure log for home monitoring for two weeks. - Instructed to check blood pressure twice daily, one hour after medication. - Advised dietary modifications to reduce salt intake and increase lean meats and beans. - Recommended regular exercise, at least 30 minutes three days a week. - Scheduled follow-up appointment in two weeks to review blood pressure log.   Family/ staff Communication: Reviewed plan of care with patient verbalized understanding   Labs/tests ordered: None   Next Appointment: Return in about 2 weeks (around 11/15/2024) for Blood pressure.   Total time: 20 minutes. Greater than 50% of total time spent doing patient education regarding bilateral Cerumen impaction and health maintenance including symptom/medication management.   Roxan BROCKS Adelise Buswell, NP    [1]  Allergies Allergen Reactions   Iodinated Contrast Media Anaphylaxis   Iodine Anaphylaxis   Metoprolol Dermatitis   Butorphanol Tartrate Other (See Comments)   Iohexol      Desc: hives- 08/04/05- asm    Latex     Added based on information entered during case entry, please review and add reactions, type, and severity as needed   Origanum Oil     Allergy to Italian/Greek seasoning Hives   Tomato Hives   "

## 2024-11-13 ENCOUNTER — Ambulatory Visit

## 2024-11-13 ENCOUNTER — Ambulatory Visit: Admitting: Family

## 2024-11-16 ENCOUNTER — Encounter: Payer: Self-pay | Admitting: Orthopedic Surgery

## 2024-11-16 ENCOUNTER — Ambulatory Visit: Admitting: Orthopedic Surgery

## 2024-11-16 ENCOUNTER — Ambulatory Visit

## 2024-11-16 VITALS — BP 154/82 | HR 76 | Temp 97.1°F | Ht 65.5 in | Wt 231.8 lb

## 2024-11-16 DIAGNOSIS — Z7901 Long term (current) use of anticoagulants: Secondary | ICD-10-CM | POA: Insufficient documentation

## 2024-11-16 DIAGNOSIS — H61893 Other specified disorders of external ear, bilateral: Secondary | ICD-10-CM

## 2024-11-16 DIAGNOSIS — Z952 Presence of prosthetic heart valve: Secondary | ICD-10-CM | POA: Insufficient documentation

## 2024-11-16 DIAGNOSIS — I10 Essential (primary) hypertension: Secondary | ICD-10-CM

## 2024-11-16 LAB — POCT INR: INR: 1.5 — AB (ref 2.0–3.0)

## 2024-11-16 NOTE — Progress Notes (Signed)
 "   Careteam: Patient Care Team: Gil Greig BRAVO, NP as PCP - General (Adult Health Nurse Practitioner) Okey Vina GAILS, MD as PCP - Cardiology (Cardiology)  Seen by: Greig Gil, AGNP-C  PLACE OF SERVICE:  [facility]  Advanced Directive information    Allergies[1]  Chief Complaint  Patient presents with   Follow-up    2wk f/u for Bp     HPI: Patient is a 69 y.o. female seen today for follow up due to elevated blood pressure.   Discussed the use of AI scribe software for clinical note transcription with the patient, who gave verbal consent to proceed.  History of Present Illness    She has been monitoring her blood pressure at home, with readings generally within the target range of 150/90 mmHg or less. Blood pressure log today shows most pressures < 140/80. Blood pressure was elevated last 2 encounters. She takes hydrochlorothiazide  25 mg daily at 9 AM. She previously used metoprolol following an atrial fibrillation episode post-surgery but has not had any recurrence since then. She is currently on Coumadin  for anticoagulation associated with atrial fib.   She experiences dry ears, particularly during this time of year. She recently used debrox to both ears due to cerumen impaction. 01/14 she had ear lavage. Now ear canal and opening have dry flaky sin with itching.    Review of Systems:  Review of Systems  Constitutional: Negative.   HENT:  Negative for ear pain, hearing loss and tinnitus.   Respiratory: Negative.    Cardiovascular: Negative.   Neurological: Negative.   Psychiatric/Behavioral: Negative.      Past Medical History:  Diagnosis Date   Allergic rhinitis 08/16/2007   Qualifier: Diagnosis of   By: Lilton CMA, Jacqualynn      IMO SNOMED Dx Update Oct 2024     Chronic anticoagulation 03/03/2024   Diastolic dysfunction 03/03/2024   Hypertension    Osteopenia 03/03/2024   Paroxysmal atrial fibrillation due to heart valve disorder (HCC) 03/03/2024   Severe  aortic stenosis 03/03/2024   Past Surgical History:  Procedure Laterality Date   TOTAL HIP ARTHROPLASTY Bilateral    Social History:   reports that she quit smoking about 12 years ago. Her smoking use included cigarettes. She started smoking about 52 years ago. She has a 40 pack-year smoking history. She has never used smokeless tobacco. She reports current alcohol use of about 5.0 - 6.0 standard drinks of alcohol per week. She reports that she does not use drugs.  Family History  Problem Relation Age of Onset   Atrial fibrillation Mother    Sudden Cardiac Death Mother    Lung cancer Father     Medications: Patient's Medications  New Prescriptions   No medications on file  Previous Medications   ACETAMINOPHEN (TYLENOL) 325 MG TABLET    Take 325 mg by mouth as needed.   AMOXICILLIN  (AMOXIL ) 500 MG CAPSULE    Take 2 capsules (1,000 mg total) by mouth once as needed for up to 1 dose.   ASCORBIC ACID (VITAMIN C) 500 MG TABLET    Take 250 mg by mouth daily.   ASPIRIN 81 MG CHEWABLE TABLET    Chew 81 mg by mouth daily.   CHOLECALCIFEROL 50 MCG (2000 UT) CAPS    Take 2,000 Units by mouth daily.   CLOBETASOL CREAM (TEMOVATE) 0.05 %    Apply 1 Application topically as needed.   CYANOCOBALAMIN (VITAMIN B12) 500 MCG TABLET    Take 500 mcg  by mouth as needed.   HYDROCHLOROTHIAZIDE  (HYDRODIURIL ) 25 MG TABLET    Take 1 tablet (25 mg total) by mouth daily.   LEVOCETIRIZINE (XYZAL) 5 MG TABLET    Take 5 mg by mouth every evening.   MUPIROCIN  OINTMENT (BACTROBAN ) 2 %    Apply 1 Application topically as needed.   OLOPATADINE HCL 0.2 % SOLN    Apply 1 drop to eye daily.   TRIAMCINOLONE OINTMENT (KENALOG) 0.1 %    Apply 1 Application topically as needed.   WARFARIN (COUMADIN ) 2 MG TABLET    Take 2-3 tablets by mouth once a day or as directed by Coumadin  Clinic  Modified Medications   No medications on file  Discontinued Medications   LORATADINE (CLARITIN) 10 MG TABLET    Take 1 tablet by mouth  daily.    Physical Exam:  Vitals:   11/16/24 1047  BP: (!) 154/82  Pulse: 76  Temp: (!) 97.1 F (36.2 C)  SpO2: 95%  Weight: 231 lb 12.8 oz (105.1 kg)  Height: 5' 5.5 (1.664 m)   Body mass index is 37.99 kg/m. Wt Readings from Last 3 Encounters:  11/16/24 231 lb 12.8 oz (105.1 kg)  11/01/24 230 lb 12.8 oz (104.7 kg)  10/26/24 230 lb 9.6 oz (104.6 kg)    Physical Exam Vitals reviewed.  Constitutional:      General: She is not in acute distress.    Appearance: She is obese.  HENT:     Head: Normocephalic.  Eyes:     General:        Right eye: No discharge.        Left eye: No discharge.  Cardiovascular:     Rate and Rhythm: Normal rate and regular rhythm.     Pulses: Normal pulses.     Heart sounds: Normal heart sounds.  Pulmonary:     Effort: Pulmonary effort is normal.     Breath sounds: Normal breath sounds.  Abdominal:     Palpations: Abdomen is soft.  Musculoskeletal:     Cervical back: Neck supple.     Right lower leg: No edema.     Left lower leg: No edema.  Skin:    General: Skin is warm.     Capillary Refill: Capillary refill takes less than 2 seconds.     Comments: Dry, flaking skin to both ear canals and opening  Neurological:     General: No focal deficit present.     Mental Status: She is alert and oriented to person, place, and time.  Psychiatric:        Mood and Affect: Mood normal.     Labs reviewed: Basic Metabolic Panel: Recent Labs    04/06/24 1134 10/26/24 1205  NA  --  132*  K  --  4.0  CL  --  98  CO2  --  28  GLUCOSE  --  82  BUN  --  16  CREATININE  --  0.62  CALCIUM  --  8.8  TSH 1.66  --    Liver Function Tests: No results for input(s): AST, ALT, ALKPHOS, BILITOT, PROT, ALBUMIN in the last 8760 hours. No results for input(s): LIPASE, AMYLASE in the last 8760 hours. No results for input(s): AMMONIA in the last 8760 hours. CBC: Recent Labs    04/06/24 1134 10/26/24 1205  WBC 5.7 6.6   NEUTROABS 3,471 4,092  HGB 14.5 14.1  HCT 44.2 42.5  MCV 92.5 90.6  PLT 292 279  Lipid Panel: No results for input(s): CHOL, HDL, LDLCALC, TRIG, CHOLHDL, LDLDIRECT in the last 8760 hours. TSH: Recent Labs    04/06/24 1134  TSH 1.66   A1C: No results found for: HGBA1C   Assessment/Plan 1. Primary hypertension (Primary) - controlled per home blood pressures - goal < 150/90 - home pressures were < 140/80 - suspect  whit coat syndrome - EKG 06/06/2024 - cont HCTZ  2. Dryness of both ear canals - dry faking skin to both canals and opening - suspect due to recent debrox use - recommend hydrocortisone 1% cream BID x 7 days  Total time: 25 minutes. Greater than 50% of total time spent doing patient education regarding health maintenance, HTN and ear dryness including symptom/medication management.     Next appt: 04/26/2025  Greig Gil BODILY  [facility] [phone]     [1]  Allergies Allergen Reactions   Iodinated Contrast Media Anaphylaxis   Iodine Anaphylaxis   Metoprolol Dermatitis   Butorphanol Tartrate Other (See Comments)   Iohexol      Desc: hives- 08/04/05- asm    Latex     Added based on information entered during case entry, please review and add reactions, type, and severity as needed   Origanum Oil     Allergy to Italian/Greek seasoning Hives   Tomato Hives   "

## 2024-11-16 NOTE — Progress Notes (Signed)
 INR 1.5  Continue warfarin 2 tablets (4mg ) daily except 2 and 1/2 tablet (5mg ) on Tuesdays and Fridays.  Please contact us  with any questions [phone]  Recheck in 6 weeks. Patient does not need print out.

## 2024-11-16 NOTE — Patient Instructions (Signed)
 Continue warfarin 2 tablets (4mg ) daily except 2 and 1/2 tablet (5mg ) on Tuesdays and Fridays.  Please contact us  with any questions [phone]  Recheck in 6 weeks. Patient does not need print out.

## 2024-11-16 NOTE — Patient Instructions (Signed)
 Hydrocortisone 1% twice daily x 7-10 days apply to outer ear canal   Continue hydrochlorothiazide  for blood pressure  Blood pressure will be elevated before you take hydrochlorothiazide  in AM> check 2 hours after medication to make sure it is < 150/90

## 2024-12-28 ENCOUNTER — Ambulatory Visit

## 2025-04-26 ENCOUNTER — Ambulatory Visit: Admitting: Orthopedic Surgery

## 2025-05-10 ENCOUNTER — Encounter: Payer: Self-pay | Admitting: Orthopedic Surgery
